# Patient Record
Sex: Female | Born: 1955
Health system: Southern US, Community
[De-identification: ages and names within clinical notes are randomized; demographics above are authoritative.]

## PROBLEM LIST (undated history)

## (undated) ENCOUNTER — Emergency Department (HOSPITAL_COMMUNITY): Admission: EM | Payer: Federal, State, Local not specified - PPO | Source: Home / Self Care

## (undated) ENCOUNTER — Emergency Department (HOSPITAL_COMMUNITY): Admission: EM | Payer: Federal, State, Local not specified - PPO

## (undated) DIAGNOSIS — Z923 Personal history of irradiation: Secondary | ICD-10-CM

## (undated) DIAGNOSIS — I2699 Other pulmonary embolism without acute cor pulmonale: Secondary | ICD-10-CM

## (undated) DIAGNOSIS — D509 Iron deficiency anemia, unspecified: Secondary | ICD-10-CM

## (undated) DIAGNOSIS — M1990 Unspecified osteoarthritis, unspecified site: Secondary | ICD-10-CM

## (undated) DIAGNOSIS — E0590 Thyrotoxicosis, unspecified without thyrotoxic crisis or storm: Secondary | ICD-10-CM

## (undated) DIAGNOSIS — D259 Leiomyoma of uterus, unspecified: Secondary | ICD-10-CM

## (undated) DIAGNOSIS — K219 Gastro-esophageal reflux disease without esophagitis: Secondary | ICD-10-CM

## (undated) DIAGNOSIS — R011 Cardiac murmur, unspecified: Secondary | ICD-10-CM

## (undated) DIAGNOSIS — E785 Hyperlipidemia, unspecified: Secondary | ICD-10-CM

## (undated) DIAGNOSIS — C801 Malignant (primary) neoplasm, unspecified: Secondary | ICD-10-CM

## (undated) DIAGNOSIS — F419 Anxiety disorder, unspecified: Secondary | ICD-10-CM

## (undated) DIAGNOSIS — I82409 Acute embolism and thrombosis of unspecified deep veins of unspecified lower extremity: Secondary | ICD-10-CM

## (undated) DIAGNOSIS — Z8719 Personal history of other diseases of the digestive system: Secondary | ICD-10-CM

## (undated) DIAGNOSIS — I10 Essential (primary) hypertension: Secondary | ICD-10-CM

## (undated) DIAGNOSIS — R002 Palpitations: Secondary | ICD-10-CM

## (undated) DIAGNOSIS — E559 Vitamin D deficiency, unspecified: Secondary | ICD-10-CM

## (undated) DIAGNOSIS — C50919 Malignant neoplasm of unspecified site of unspecified female breast: Secondary | ICD-10-CM

## (undated) DIAGNOSIS — R51 Headache: Secondary | ICD-10-CM

## (undated) HISTORY — PX: COLONOSCOPY: SHX174

## (undated) HISTORY — PX: OTHER SURGICAL HISTORY: SHX169

## (undated) HISTORY — DX: Hyperlipidemia, unspecified: E78.5

## (undated) HISTORY — DX: Iron deficiency anemia, unspecified: D50.9

## (undated) HISTORY — DX: Malignant neoplasm of unspecified site of unspecified female breast: C50.919

## (undated) HISTORY — PX: CHOLECYSTECTOMY: SHX55

## (undated) HISTORY — PX: ESOPHAGOGASTRODUODENOSCOPY: SHX1529

## (undated) HISTORY — DX: Acute embolism and thrombosis of unspecified deep veins of unspecified lower extremity: I82.409

## (undated) HISTORY — DX: Vitamin D deficiency, unspecified: E55.9

## (undated) HISTORY — DX: Other pulmonary embolism without acute cor pulmonale: I26.99

## (undated) HISTORY — DX: Essential (primary) hypertension: I10

## (undated) HISTORY — PX: DILATION AND CURETTAGE OF UTERUS: SHX78

## (undated) HISTORY — DX: Leiomyoma of uterus, unspecified: D25.9

## (undated) HISTORY — DX: Personal history of irradiation: Z92.3

## (undated) HISTORY — DX: Headache: R51

---

## 2008-02-12 DIAGNOSIS — I2699 Other pulmonary embolism without acute cor pulmonale: Secondary | ICD-10-CM

## 2008-02-12 HISTORY — DX: Other pulmonary embolism without acute cor pulmonale: I26.99

## 2008-03-30 ENCOUNTER — Ambulatory Visit: Payer: Self-pay | Admitting: Internal Medicine

## 2008-03-30 DIAGNOSIS — J309 Allergic rhinitis, unspecified: Secondary | ICD-10-CM | POA: Insufficient documentation

## 2008-03-30 DIAGNOSIS — E785 Hyperlipidemia, unspecified: Secondary | ICD-10-CM | POA: Insufficient documentation

## 2008-03-30 DIAGNOSIS — I10 Essential (primary) hypertension: Secondary | ICD-10-CM | POA: Insufficient documentation

## 2008-03-30 DIAGNOSIS — K219 Gastro-esophageal reflux disease without esophagitis: Secondary | ICD-10-CM | POA: Insufficient documentation

## 2008-03-30 HISTORY — DX: Allergic rhinitis, unspecified: J30.9

## 2008-04-08 ENCOUNTER — Telehealth: Payer: Self-pay | Admitting: Internal Medicine

## 2008-04-14 ENCOUNTER — Telehealth: Payer: Self-pay | Admitting: Internal Medicine

## 2008-04-14 ENCOUNTER — Encounter: Payer: Self-pay | Admitting: Internal Medicine

## 2008-04-18 ENCOUNTER — Ambulatory Visit: Payer: Self-pay | Admitting: Internal Medicine

## 2008-04-18 ENCOUNTER — Telehealth (INDEPENDENT_AMBULATORY_CARE_PROVIDER_SITE_OTHER): Payer: Self-pay | Admitting: *Deleted

## 2008-04-19 ENCOUNTER — Encounter (INDEPENDENT_AMBULATORY_CARE_PROVIDER_SITE_OTHER): Payer: Self-pay | Admitting: *Deleted

## 2008-04-19 DIAGNOSIS — D509 Iron deficiency anemia, unspecified: Secondary | ICD-10-CM | POA: Insufficient documentation

## 2008-04-19 DIAGNOSIS — E538 Deficiency of other specified B group vitamins: Secondary | ICD-10-CM | POA: Insufficient documentation

## 2008-04-19 LAB — CONVERTED CEMR LAB
AST: 16 units/L (ref 0–37)
Albumin: 3.2 g/dL — ABNORMAL LOW (ref 3.5–5.2)
Alkaline Phosphatase: 63 units/L (ref 39–117)
BUN: 12 mg/dL (ref 6–23)
Basophils Absolute: 0 10*3/uL (ref 0.0–0.1)
Basophils Relative: 0.2 % (ref 0.0–3.0)
Bilirubin, Direct: 0.1 mg/dL (ref 0.0–0.3)
CO2: 26 meq/L (ref 19–32)
Calcium: 8.7 mg/dL (ref 8.4–10.5)
Cholesterol: 165 mg/dL (ref 0–200)
Eosinophils Absolute: 0.2 10*3/uL (ref 0.0–0.7)
GFR calc Af Amer: 85 mL/min
Glucose, Bld: 98 mg/dL (ref 70–99)
Hemoglobin, Urine: NEGATIVE
Hemoglobin: 11.7 g/dL — ABNORMAL LOW (ref 12.0–15.0)
Ketones, ur: NEGATIVE mg/dL
LDL Cholesterol: 114 mg/dL — ABNORMAL HIGH (ref 0–99)
MCHC: 34 g/dL (ref 30.0–36.0)
MCV: 92.6 fL (ref 78.0–100.0)
Monocytes Absolute: 0.4 10*3/uL (ref 0.1–1.0)
Neutro Abs: 3.9 10*3/uL (ref 1.4–7.7)
Neutrophils Relative %: 62.3 % (ref 43.0–77.0)
Potassium: 3.9 meq/L (ref 3.5–5.1)
RBC: 3.71 M/uL — ABNORMAL LOW (ref 3.87–5.11)
Saturation Ratios: 7.9 % — ABNORMAL LOW (ref 20.0–50.0)
Sodium: 139 meq/L (ref 135–145)
Specific Gravity, Urine: >=1.03 (ref 1.000–1.035)
Total Bilirubin: 0.6 mg/dL (ref 0.3–1.2)
Total Protein: 7 g/dL (ref 6.0–8.3)
Transferrin: 359.4 mg/dL (ref 212.0–360.0)
Triglycerides: 91 mg/dL (ref 0–149)
Urine Glucose: NEGATIVE mg/dL
Urobilinogen, UA: 0.2 (ref 0.0–1.0)
VLDL: 18 mg/dL (ref 0–40)
Vitamin B-12: 190 pg/mL — ABNORMAL LOW (ref 211–911)
pH: 5.5 (ref 5.0–8.0)

## 2008-04-22 ENCOUNTER — Ambulatory Visit: Payer: Self-pay | Admitting: Internal Medicine

## 2008-04-22 DIAGNOSIS — M79609 Pain in unspecified limb: Secondary | ICD-10-CM

## 2008-04-22 HISTORY — DX: Pain in unspecified limb: M79.609

## 2008-04-26 ENCOUNTER — Inpatient Hospital Stay (HOSPITAL_COMMUNITY): Admission: AD | Admit: 2008-04-26 | Discharge: 2008-05-01 | Payer: Self-pay | Admitting: Cardiology

## 2008-04-27 ENCOUNTER — Telehealth: Payer: Self-pay | Admitting: Internal Medicine

## 2008-05-05 ENCOUNTER — Telehealth (INDEPENDENT_AMBULATORY_CARE_PROVIDER_SITE_OTHER): Payer: Self-pay | Admitting: *Deleted

## 2008-05-06 ENCOUNTER — Encounter: Admission: RE | Admit: 2008-05-06 | Discharge: 2008-05-06 | Payer: Self-pay | Admitting: Cardiovascular Disease

## 2008-05-06 ENCOUNTER — Encounter: Payer: Self-pay | Admitting: Internal Medicine

## 2008-05-06 ENCOUNTER — Encounter (INDEPENDENT_AMBULATORY_CARE_PROVIDER_SITE_OTHER): Payer: Self-pay | Admitting: Cardiology

## 2008-05-06 ENCOUNTER — Inpatient Hospital Stay (HOSPITAL_COMMUNITY): Admission: AD | Admit: 2008-05-06 | Discharge: 2008-05-13 | Payer: Self-pay | Admitting: Cardiology

## 2008-05-06 ENCOUNTER — Ambulatory Visit: Payer: Self-pay | Admitting: Vascular Surgery

## 2008-05-08 ENCOUNTER — Ambulatory Visit: Payer: Self-pay | Admitting: Oncology

## 2008-05-13 ENCOUNTER — Ambulatory Visit: Payer: Self-pay | Admitting: Oncology

## 2008-05-17 ENCOUNTER — Encounter: Payer: Self-pay | Admitting: Internal Medicine

## 2008-06-01 LAB — CBC WITH DIFFERENTIAL/PLATELET
Basophils Absolute: 0 10*3/uL (ref 0.0–0.1)
Eosinophils Absolute: 0.1 10*3/uL (ref 0.0–0.5)
HGB: 11.6 g/dL (ref 11.6–15.9)
MCV: 91.8 fL (ref 79.5–101.0)
MONO%: 7.4 % (ref 0.0–14.0)
NEUT#: 2.3 10*3/uL (ref 1.5–6.5)
RDW: 14.4 % (ref 11.2–14.5)

## 2008-06-02 LAB — COMPREHENSIVE METABOLIC PANEL
ALT: 16 U/L (ref 0–35)
CO2: 25 mEq/L (ref 19–32)
Calcium: 9.1 mg/dL (ref 8.4–10.5)
Chloride: 105 mEq/L (ref 96–112)
Creatinine, Ser: 0.85 mg/dL (ref 0.40–1.20)
Total Protein: 7.4 g/dL (ref 6.0–8.3)

## 2008-06-02 LAB — FERRITIN: Ferritin: 16 ng/mL (ref 10–291)

## 2008-06-02 LAB — IRON AND TIBC: %SAT: 13 % — ABNORMAL LOW (ref 20–55)

## 2008-06-28 ENCOUNTER — Ambulatory Visit: Payer: Self-pay | Admitting: Oncology

## 2008-06-28 LAB — CBC WITH DIFFERENTIAL/PLATELET
BASO%: 0.2 % (ref 0.0–2.0)
Basophils Absolute: 0 10*3/uL (ref 0.0–0.1)
EOS%: 0.9 % (ref 0.0–7.0)
MCH: 31.1 pg (ref 25.1–34.0)
MCHC: 33.5 g/dL (ref 31.5–36.0)
MCV: 92.8 fL (ref 79.5–101.0)
MONO%: 6.3 % (ref 0.0–14.0)
RBC: 3.76 10*6/uL (ref 3.70–5.45)
RDW: 14.2 % (ref 11.2–14.5)
nRBC: 0 % (ref 0–0)

## 2008-06-28 LAB — FERRITIN: Ferritin: 15 ng/mL (ref 10–291)

## 2008-06-28 LAB — IRON AND TIBC: Iron: 85 ug/dL (ref 42–145)

## 2008-08-02 LAB — IRON AND TIBC
Iron: 73 ug/dL (ref 42–145)
TIBC: 325 ug/dL (ref 250–470)
UIBC: 252 ug/dL

## 2008-08-02 LAB — FERRITIN: Ferritin: 13 ng/mL (ref 10–291)

## 2008-08-02 LAB — CBC WITH DIFFERENTIAL/PLATELET
Basophils Absolute: 0 10*3/uL (ref 0.0–0.1)
EOS%: 2.3 % (ref 0.0–7.0)
Eosinophils Absolute: 0.1 10*3/uL (ref 0.0–0.5)
HGB: 12.3 g/dL (ref 11.6–15.9)
NEUT#: 2.5 10*3/uL (ref 1.5–6.5)
RDW: 14.5 % (ref 11.2–14.5)
lymph#: 2 10*3/uL (ref 0.9–3.3)

## 2008-08-17 ENCOUNTER — Ambulatory Visit: Payer: Self-pay | Admitting: Internal Medicine

## 2008-10-03 ENCOUNTER — Ambulatory Visit: Payer: Self-pay | Admitting: Oncology

## 2008-10-05 LAB — CBC WITH DIFFERENTIAL/PLATELET
Basophils Absolute: 0 10*3/uL (ref 0.0–0.1)
Eosinophils Absolute: 0.1 10*3/uL (ref 0.0–0.5)
HGB: 12.5 g/dL (ref 11.6–15.9)
LYMPH%: 30.2 % (ref 14.0–49.7)
MCV: 98 fL (ref 79.5–101.0)
MONO%: 8.4 % (ref 0.0–14.0)
NEUT#: 2.6 10*3/uL (ref 1.5–6.5)
Platelets: 153 10*3/uL (ref 145–400)
RBC: 3.79 10*6/uL (ref 3.70–5.45)

## 2008-10-06 LAB — FERRITIN: Ferritin: 26 ng/mL (ref 10–291)

## 2008-10-06 LAB — FACTOR 8 ASSAY: Coagulation Factor VIII: 223 % — ABNORMAL HIGH (ref 73–140)

## 2008-12-13 ENCOUNTER — Ambulatory Visit: Payer: Self-pay | Admitting: Oncology

## 2008-12-15 LAB — COMPREHENSIVE METABOLIC PANEL
ALT: 22 U/L (ref 0–35)
CO2: 28 mEq/L (ref 19–32)
Calcium: 9 mg/dL (ref 8.4–10.5)
Chloride: 106 mEq/L (ref 96–112)
Creatinine, Ser: 0.88 mg/dL (ref 0.40–1.20)
Glucose, Bld: 82 mg/dL (ref 70–99)
Sodium: 139 mEq/L (ref 135–145)
Total Bilirubin: 0.3 mg/dL (ref 0.3–1.2)
Total Protein: 7.8 g/dL (ref 6.0–8.3)

## 2008-12-15 LAB — CBC WITH DIFFERENTIAL/PLATELET
Eosinophils Absolute: 0.2 10*3/uL (ref 0.0–0.5)
HCT: 38.3 % (ref 34.8–46.6)
LYMPH%: 36 % (ref 14.0–49.7)
MONO#: 0.6 10*3/uL (ref 0.1–0.9)
NEUT#: 3.2 10*3/uL (ref 1.5–6.5)
NEUT%: 50.9 % (ref 38.4–76.8)
Platelets: 161 10*3/uL (ref 145–400)
WBC: 6.2 10*3/uL (ref 3.9–10.3)
lymph#: 2.2 10*3/uL (ref 0.9–3.3)

## 2008-12-16 LAB — IRON AND TIBC
%SAT: 33 % (ref 20–55)
Iron: 106 ug/dL (ref 42–145)

## 2008-12-16 LAB — FERRITIN: Ferritin: 28 ng/mL (ref 10–291)

## 2009-01-03 ENCOUNTER — Encounter: Payer: Self-pay | Admitting: Internal Medicine

## 2009-01-10 ENCOUNTER — Encounter: Admission: RE | Admit: 2009-01-10 | Discharge: 2009-01-10 | Payer: Self-pay | Admitting: Cardiovascular Disease

## 2009-03-15 ENCOUNTER — Ambulatory Visit: Payer: Self-pay | Admitting: Oncology

## 2009-03-17 LAB — CBC WITH DIFFERENTIAL/PLATELET
Eosinophils Absolute: 0.1 10*3/uL (ref 0.0–0.5)
HCT: 37.7 % (ref 34.8–46.6)
LYMPH%: 34.9 % (ref 14.0–49.7)
MCHC: 34.5 g/dL (ref 31.5–36.0)
MCV: 99.8 fL (ref 79.5–101.0)
MONO#: 0.3 10*3/uL (ref 0.1–0.9)
MONO%: 6.1 % (ref 0.0–14.0)
NEUT#: 2.5 10*3/uL (ref 1.5–6.5)
NEUT%: 56.5 % (ref 38.4–76.8)
Platelets: 185 10*3/uL (ref 145–400)
RBC: 3.78 10*6/uL (ref 3.70–5.45)
WBC: 4.5 10*3/uL (ref 3.9–10.3)

## 2009-03-17 LAB — COMPREHENSIVE METABOLIC PANEL
Alkaline Phosphatase: 56 U/L (ref 39–117)
CO2: 23 mEq/L (ref 19–32)
Creatinine, Ser: 0.83 mg/dL (ref 0.40–1.20)
Glucose, Bld: 90 mg/dL (ref 70–99)
Sodium: 139 mEq/L (ref 135–145)
Total Bilirubin: 0.3 mg/dL (ref 0.3–1.2)

## 2009-03-20 LAB — FACTOR 8 ASSAY: Coagulation Factor VIII: 305 % — ABNORMAL HIGH (ref 73–140)

## 2009-03-28 ENCOUNTER — Encounter: Payer: Self-pay | Admitting: Internal Medicine

## 2009-06-13 ENCOUNTER — Ambulatory Visit: Payer: Self-pay | Admitting: Oncology

## 2009-06-14 LAB — CBC WITH DIFFERENTIAL/PLATELET
BASO%: 0.6 % (ref 0.0–2.0)
EOS%: 1.8 % (ref 0.0–7.0)
HGB: 12.6 g/dL (ref 11.6–15.9)
MCH: 33.9 pg (ref 25.1–34.0)
MCHC: 33.7 g/dL (ref 31.5–36.0)
MCV: 100.5 fL (ref 79.5–101.0)
MONO%: 6.6 % (ref 0.0–14.0)
RBC: 3.7 10*6/uL (ref 3.70–5.45)
RDW: 13.1 % (ref 11.2–14.5)
lymph#: 2.1 10*3/uL (ref 0.9–3.3)

## 2009-06-14 LAB — COMPREHENSIVE METABOLIC PANEL
ALT: 15 U/L (ref 0–35)
AST: 18 U/L (ref 0–37)
Albumin: 3.7 g/dL (ref 3.5–5.2)
Alkaline Phosphatase: 60 U/L (ref 39–117)
Calcium: 8.8 mg/dL (ref 8.4–10.5)
Chloride: 107 mEq/L (ref 96–112)
Potassium: 3.5 mEq/L (ref 3.5–5.3)
Sodium: 138 mEq/L (ref 135–145)

## 2009-07-19 ENCOUNTER — Emergency Department (HOSPITAL_COMMUNITY): Admission: EM | Admit: 2009-07-19 | Discharge: 2009-07-19 | Payer: Self-pay | Admitting: Emergency Medicine

## 2009-08-22 ENCOUNTER — Telehealth: Payer: Self-pay | Admitting: Internal Medicine

## 2009-09-25 ENCOUNTER — Ambulatory Visit: Payer: Self-pay | Admitting: Oncology

## 2009-09-27 LAB — CBC WITH DIFFERENTIAL/PLATELET
Basophils Absolute: 0 10*3/uL (ref 0.0–0.1)
EOS%: 1.9 % (ref 0.0–7.0)
HGB: 12.3 g/dL (ref 11.6–15.9)
LYMPH%: 38.1 % (ref 14.0–49.7)
MCH: 33.6 pg (ref 25.1–34.0)
MCV: 99 fL (ref 79.5–101.0)
MONO%: 9.4 % (ref 0.0–14.0)
Platelets: 187 10*3/uL (ref 145–400)
RBC: 3.65 10*6/uL — ABNORMAL LOW (ref 3.70–5.45)
RDW: 12.8 % (ref 11.2–14.5)

## 2009-09-28 LAB — FACTOR 8 ASSAY: Coagulation Factor VIII: 154 % — ABNORMAL HIGH (ref 73–140)

## 2009-09-28 LAB — COMPREHENSIVE METABOLIC PANEL
AST: 12 U/L (ref 0–37)
Albumin: 3.9 g/dL (ref 3.5–5.2)
Alkaline Phosphatase: 56 U/L (ref 39–117)
BUN: 9 mg/dL (ref 6–23)
Potassium: 4 mEq/L (ref 3.5–5.3)
Total Bilirubin: 0.2 mg/dL — ABNORMAL LOW (ref 0.3–1.2)

## 2009-09-28 LAB — IRON AND TIBC
%SAT: 15 % — ABNORMAL LOW (ref 20–55)
TIBC: 307 ug/dL (ref 250–470)
UIBC: 260 ug/dL

## 2009-09-28 LAB — D-DIMER, QUANTITATIVE: D-Dimer, Quant: 0.38 ug/mL-FEU (ref 0.00–0.48)

## 2009-09-28 LAB — FERRITIN: Ferritin: 26 ng/mL (ref 10–291)

## 2009-09-29 ENCOUNTER — Encounter: Admission: RE | Admit: 2009-09-29 | Discharge: 2009-09-29 | Payer: Self-pay | Admitting: Cardiology

## 2009-10-12 ENCOUNTER — Encounter: Payer: Self-pay | Admitting: Internal Medicine

## 2009-12-19 ENCOUNTER — Ambulatory Visit: Payer: Self-pay | Admitting: Oncology

## 2009-12-21 LAB — CBC WITH DIFFERENTIAL/PLATELET
BASO%: 0.5 % (ref 0.0–2.0)
Basophils Absolute: 0 10*3/uL (ref 0.0–0.1)
Eosinophils Absolute: 0.1 10*3/uL (ref 0.0–0.5)
HCT: 37.6 % (ref 34.8–46.6)
HGB: 12.6 g/dL (ref 11.6–15.9)
LYMPH%: 36.8 % (ref 14.0–49.7)
MONO#: 0.5 10*3/uL (ref 0.1–0.9)
NEUT#: 3 10*3/uL (ref 1.5–6.5)
NEUT%: 52.7 % (ref 38.4–76.8)
Platelets: 201 10*3/uL (ref 145–400)
WBC: 5.8 10*3/uL (ref 3.9–10.3)
lymph#: 2.1 10*3/uL (ref 0.9–3.3)

## 2009-12-21 LAB — COMPREHENSIVE METABOLIC PANEL
ALT: 18 U/L (ref 0–35)
BUN: 10 mg/dL (ref 6–23)
CO2: 28 mEq/L (ref 19–32)
Calcium: 9.7 mg/dL (ref 8.4–10.5)
Chloride: 103 mEq/L (ref 96–112)
Creatinine, Ser: 0.87 mg/dL (ref 0.40–1.20)
Glucose, Bld: 86 mg/dL (ref 70–99)

## 2009-12-22 LAB — FACTOR 8 ASSAY: Coagulation Factor VIII: 338 % — ABNORMAL HIGH (ref 73–140)

## 2009-12-27 ENCOUNTER — Encounter: Admission: RE | Admit: 2009-12-27 | Discharge: 2009-12-27 | Payer: Self-pay | Admitting: Obstetrics and Gynecology

## 2010-01-08 ENCOUNTER — Telehealth (INDEPENDENT_AMBULATORY_CARE_PROVIDER_SITE_OTHER): Payer: Self-pay | Admitting: *Deleted

## 2010-03-13 NOTE — Progress Notes (Signed)
  Phone Note Refill Request Message from:  Fax from Pharmacy on August 22, 2009 8:49 AM  Refills Requested: Medication #1:  PREVACID 30 MG CPDR 1 by mouth once daily   Dosage confirmed as above?Dosage Confirmed   Last Refilled: 04/2008   Notes: [facility] Initial call taken by: Zella Ball Ewing CMA (AAMA),  August 22, 2009 8:49 AM    Prescriptions: PREVACID 30 MG CPDR (LANSOPRAZOLE) 1 by mouth once daily  #30 x 0   Entered by:   Scharlene Gloss CMA (AAMA)   Authorized by:   Corwin Levins MD   Signed by:   Scharlene Gloss CMA (AAMA) on 08/22/2009   Method used:   Faxed to ...       [facility] (retail)       [address]       [place], [place]  [address]       Ph: [phone]       Fax: [phone]   RxID:   [id number]

## 2010-03-13 NOTE — Progress Notes (Signed)
  Phone Note Other Incoming   Request: Send information Summary of Call: Request received from [facility] forwarded to [facility].

## 2010-03-13 NOTE — Letter (Signed)
Summary: [facility]  [facility]   Imported By: Sherian Rein 04/03/2009 07:25:18  _____________________________________________________________________  External Attachment:    Type:   Image     Comment:   External Document

## 2010-03-13 NOTE — Letter (Signed)
Summary: [facility]  [facility]   Imported By: Lester White 10/26/2009 10:07:35  _____________________________________________________________________  External Attachment:    Type:   Image     Comment:   External Document

## 2010-04-06 ENCOUNTER — Encounter (HOSPITAL_BASED_OUTPATIENT_CLINIC_OR_DEPARTMENT_OTHER): Payer: Federal, State, Local not specified - PPO | Admitting: Oncology

## 2010-04-06 ENCOUNTER — Other Ambulatory Visit: Payer: Self-pay | Admitting: Oncology

## 2010-04-06 DIAGNOSIS — I82409 Acute embolism and thrombosis of unspecified deep veins of unspecified lower extremity: Secondary | ICD-10-CM

## 2010-04-06 DIAGNOSIS — Z7901 Long term (current) use of anticoagulants: Secondary | ICD-10-CM

## 2010-04-06 DIAGNOSIS — D509 Iron deficiency anemia, unspecified: Secondary | ICD-10-CM

## 2010-04-06 DIAGNOSIS — Z86718 Personal history of other venous thrombosis and embolism: Secondary | ICD-10-CM

## 2010-04-06 LAB — CBC WITH DIFFERENTIAL/PLATELET
BASO%: 0.6 % (ref 0.0–2.0)
Basophils Absolute: 0 10*3/uL (ref 0.0–0.1)
EOS%: 2.6 % (ref 0.0–7.0)
HGB: 12.3 g/dL (ref 11.6–15.9)
MCH: 32.5 pg (ref 25.1–34.0)
MCHC: 33.8 g/dL (ref 31.5–36.0)
MONO#: 0.4 10*3/uL (ref 0.1–0.9)
RDW: 12.8 % (ref 11.2–14.5)
WBC: 5.4 10*3/uL (ref 3.9–10.3)
lymph#: 2 10*3/uL (ref 0.9–3.3)

## 2010-04-06 LAB — COMPREHENSIVE METABOLIC PANEL
ALT: 13 U/L (ref 0–35)
AST: 14 U/L (ref 0–37)
Albumin: 3.8 g/dL (ref 3.5–5.2)
Calcium: 9.3 mg/dL (ref 8.4–10.5)
Chloride: 105 mEq/L (ref 96–112)
Potassium: 4.1 mEq/L (ref 3.5–5.3)

## 2010-04-30 LAB — BASIC METABOLIC PANEL
CO2: 23 mEq/L (ref 19–32)
Chloride: 109 mEq/L (ref 96–112)
GFR calc Af Amer: >60 mL/min (ref >60)
Potassium: 4.2 mEq/L (ref 3.5–5.1)
Sodium: 136 mEq/L (ref 135–145)

## 2010-05-23 LAB — CBC
HCT: 31.3 % — ABNORMAL LOW (ref 36.0–46.0)
MCV: 92.4 fL (ref 78.0–100.0)
RBC: 3.38 MIL/uL — ABNORMAL LOW (ref 3.87–5.11)
WBC: 8 10*3/uL (ref 4.0–10.5)

## 2010-05-24 LAB — CBC
HCT: 31.9 % — ABNORMAL LOW (ref 36.0–46.0)
HCT: 32.5 % — ABNORMAL LOW (ref 36.0–46.0)
HCT: 32.5 % — ABNORMAL LOW (ref 36.0–46.0)
HCT: 33.5 % — ABNORMAL LOW (ref 36.0–46.0)
HCT: 34.2 % — ABNORMAL LOW (ref 36.0–46.0)
HCT: 35.8 % — ABNORMAL LOW (ref 36.0–46.0)
Hemoglobin: 10.8 g/dL — ABNORMAL LOW (ref 12.0–15.0)
Hemoglobin: 10.9 g/dL — ABNORMAL LOW (ref 12.0–15.0)
Hemoglobin: 10.9 g/dL — ABNORMAL LOW (ref 12.0–15.0)
Hemoglobin: 11.6 g/dL — ABNORMAL LOW (ref 12.0–15.0)
Hemoglobin: 12.3 g/dL (ref 12.0–15.0)
MCHC: 33.8 g/dL (ref 30.0–36.0)
MCHC: 33.9 g/dL (ref 30.0–36.0)
MCHC: 33.9 g/dL (ref 30.0–36.0)
MCHC: 34.2 g/dL (ref 30.0–36.0)
MCV: 91.6 fL (ref 78.0–100.0)
MCV: 92.9 fL (ref 78.0–100.0)
MCV: 93 fL (ref 78.0–100.0)
MCV: 93.1 fL (ref 78.0–100.0)
MCV: 93.7 fL (ref 78.0–100.0)
MCV: 93.7 fL (ref 78.0–100.0)
MCV: 94.1 fL (ref 78.0–100.0)
Platelets: 129 10*3/uL — ABNORMAL LOW (ref 150–400)
Platelets: 160 10*3/uL (ref 150–400)
Platelets: 210 10*3/uL (ref 150–400)
Platelets: 213 10*3/uL (ref 150–400)
Platelets: 223 10*3/uL (ref 150–400)
Platelets: 224 10*3/uL (ref 150–400)
RBC: 3.41 MIL/uL — ABNORMAL LOW (ref 3.87–5.11)
RBC: 3.47 MIL/uL — ABNORMAL LOW (ref 3.87–5.11)
RBC: 3.48 MIL/uL — ABNORMAL LOW (ref 3.87–5.11)
RBC: 3.56 MIL/uL — ABNORMAL LOW (ref 3.87–5.11)
RBC: 3.61 MIL/uL — ABNORMAL LOW (ref 3.87–5.11)
RBC: 3.68 MIL/uL — ABNORMAL LOW (ref 3.87–5.11)
RDW: 13.9 % (ref 11.5–15.5)
RDW: 14.1 % (ref 11.5–15.5)
RDW: 14.6 % (ref 11.5–15.5)
RDW: 14.6 % (ref 11.5–15.5)
WBC: 6.5 10*3/uL (ref 4.0–10.5)
WBC: 7.6 10*3/uL (ref 4.0–10.5)
WBC: 7.9 10*3/uL (ref 4.0–10.5)
WBC: 8.1 10*3/uL (ref 4.0–10.5)
WBC: 8.2 10*3/uL (ref 4.0–10.5)
WBC: 8.4 10*3/uL (ref 4.0–10.5)
WBC: 8.4 10*3/uL (ref 4.0–10.5)
WBC: 8.6 10*3/uL (ref 4.0–10.5)
WBC: 8.8 10*3/uL (ref 4.0–10.5)

## 2010-05-24 LAB — LUPUS ANTICOAGULANT PANEL: Lupus Anticoagulant: NOT DETECTED

## 2010-05-24 LAB — COMPREHENSIVE METABOLIC PANEL
ALT: 14 U/L (ref 0–35)
ALT: 16 U/L (ref 0–35)
AST: 17 U/L (ref 0–37)
Albumin: 3 g/dL — ABNORMAL LOW (ref 3.5–5.2)
Albumin: 3 g/dL — ABNORMAL LOW (ref 3.5–5.2)
Alkaline Phosphatase: 51 U/L (ref 39–117)
Alkaline Phosphatase: 58 U/L (ref 39–117)
BUN: 7 mg/dL (ref 6–23)
CO2: 25 mEq/L (ref 19–32)
Chloride: 107 mEq/L (ref 96–112)
Chloride: 109 mEq/L (ref 96–112)
Creatinine, Ser: 0.9 mg/dL (ref 0.4–1.2)
GFR calc Af Amer: >60 mL/min (ref >60)
GFR calc non Af Amer: >60 mL/min (ref >60)
Glucose, Bld: 88 mg/dL (ref 70–99)
Glucose, Bld: 90 mg/dL (ref 70–99)
Potassium: 3.9 mEq/L (ref 3.5–5.1)
Potassium: 4 mEq/L (ref 3.5–5.1)
Sodium: 137 mEq/L (ref 135–145)
Sodium: 141 mEq/L (ref 135–145)
Total Bilirubin: 0.3 mg/dL (ref 0.3–1.2)
Total Bilirubin: 0.4 mg/dL (ref 0.3–1.2)
Total Protein: 6.6 g/dL (ref 6.0–8.3)
Total Protein: 6.6 g/dL (ref 6.0–8.3)
Total Protein: 6.9 g/dL (ref 6.0–8.3)

## 2010-05-24 LAB — APTT: aPTT: 32 seconds (ref 24–37)

## 2010-05-24 LAB — BASIC METABOLIC PANEL
BUN: 10 mg/dL (ref 6–23)
BUN: 12 mg/dL (ref 6–23)
BUN: 8 mg/dL (ref 6–23)
CO2: 22 mEq/L (ref 19–32)
CO2: 23 mEq/L (ref 19–32)
CO2: 23 mEq/L (ref 19–32)
Calcium: 9.1 mg/dL (ref 8.4–10.5)
Chloride: 104 mEq/L (ref 96–112)
Chloride: 106 mEq/L (ref 96–112)
Creatinine, Ser: 0.82 mg/dL (ref 0.4–1.2)
Creatinine, Ser: 0.84 mg/dL (ref 0.4–1.2)
Creatinine, Ser: 0.93 mg/dL (ref 0.4–1.2)
GFR calc Af Amer: >60 mL/min (ref >60)
GFR calc Af Amer: >60 mL/min (ref >60)
GFR calc Af Amer: >60 mL/min (ref >60)
GFR calc non Af Amer: >60 mL/min (ref >60)
GFR calc non Af Amer: >60 mL/min (ref >60)
Glucose, Bld: 102 mg/dL — ABNORMAL HIGH (ref 70–99)
Glucose, Bld: 95 mg/dL (ref 70–99)
Potassium: 3.7 mEq/L (ref 3.5–5.1)
Potassium: 3.9 mEq/L (ref 3.5–5.1)
Potassium: 4 mEq/L (ref 3.5–5.1)
Sodium: 137 mEq/L (ref 135–145)
Sodium: 137 mEq/L (ref 135–145)

## 2010-05-24 LAB — FIBRINOGEN: Fibrinogen: 449 mg/dL (ref 204–475)

## 2010-05-24 LAB — HEPARIN ANTIBODY SCREEN: Heparin Antibody Screen: NEGATIVE

## 2010-05-24 LAB — PROTIME-INR
INR: 1.2 (ref 0.00–1.49)
INR: 1.3 (ref 0.00–1.49)
INR: 2 — ABNORMAL HIGH (ref 0.00–1.49)
INR: 2.2 — ABNORMAL HIGH (ref 0.00–1.49)
Prothrombin Time: 16.6 seconds — ABNORMAL HIGH (ref 11.6–15.2)
Prothrombin Time: 17.2 seconds — ABNORMAL HIGH (ref 11.6–15.2)
Prothrombin Time: 22.4 seconds — ABNORMAL HIGH (ref 11.6–15.2)

## 2010-05-24 LAB — HEPARIN LEVEL (UNFRACTIONATED)
Heparin Unfractionated: 0.24 IU/mL — ABNORMAL LOW (ref 0.30–0.70)
Heparin Unfractionated: 0.41 IU/mL (ref 0.30–0.70)
Heparin Unfractionated: 0.64 IU/mL (ref 0.30–0.70)
Heparin Unfractionated: 0.67 IU/mL (ref 0.30–0.70)
Heparin Unfractionated: 0.68 IU/mL (ref 0.30–0.70)
Heparin Unfractionated: 0.85 IU/mL — ABNORMAL HIGH (ref 0.30–0.70)

## 2010-05-24 LAB — FACTOR 2 ASSAY: Factor II Activity: 124 % (ref 74–131)

## 2010-05-24 LAB — BETA-2-GLYCOPROTEIN I ABS, IGG/M/A
Beta-2 Glyco I IgG: 6 U/mL (ref <20)
Beta-2-Glycoprotein I IgA: <4 U/mL (ref <10)

## 2010-05-24 LAB — IGG, IGA, IGM
IgA: <7 mg/dL — ABNORMAL LOW (ref 68–378)
IgG (Immunoglobin G), Serum: 2330 mg/dL — ABNORMAL HIGH (ref 694–1618)

## 2010-05-24 LAB — FACTOR 8 ASSAY: Coagulation Factor VIII: 339 % — ABNORMAL HIGH (ref 73–140)

## 2010-05-24 LAB — GLUCOSE, CAPILLARY: Glucose-Capillary: 101 mg/dL — ABNORMAL HIGH (ref 70–99)

## 2010-05-24 LAB — PROTEIN S ACTIVITY: Protein S Activity: 126 % (ref 69–129)

## 2010-05-24 LAB — PROTEIN C ACTIVITY: Protein C Activity: 131 % (ref 75–133)

## 2010-05-24 LAB — HISTOPLASMA ANTIGEN (BLD, CSF, BRONCH WASH, OTHER): Histoplasma Antigen (HISTAG): NEGATIVE

## 2010-06-12 ENCOUNTER — Other Ambulatory Visit: Payer: Self-pay | Admitting: Obstetrics and Gynecology

## 2010-06-12 DIAGNOSIS — Z1231 Encounter for screening mammogram for malignant neoplasm of breast: Secondary | ICD-10-CM

## 2010-06-20 ENCOUNTER — Ambulatory Visit (HOSPITAL_COMMUNITY)
Admission: RE | Admit: 2010-06-20 | Discharge: 2010-06-20 | Disposition: A | Discharge status: Home / Self Care | Payer: Federal, State, Local not specified - PPO | Source: Ambulatory Visit | Attending: Obstetrics and Gynecology | Admitting: Obstetrics and Gynecology

## 2010-06-20 DIAGNOSIS — Z1231 Encounter for screening mammogram for malignant neoplasm of breast: Secondary | ICD-10-CM | POA: Insufficient documentation

## 2010-06-26 NOTE — Discharge Summary (Signed)
NAMEMADEEHA, COSTANTINO               ACCOUNT NO.:  [id number]   MEDICAL RECORD NO.:  [id number]          PATIENT TYPE:  INP   LOCATION:  2008                         FACILITY:  [facility]   PHYSICIAN:  Sheliah Mends, MD      DATE OF BIRTH:  [birth year]   DATE OF ADMISSION:  04/26/2008  DATE OF DISCHARGE:  05/01/2008                               DISCHARGE SUMMARY   DISCHARGE DIAGNOSES:  1. Right lower extremity deep vein thrombosis.  2. Uterine fibroids, on estrogen containing medication prior to      admission.  3. Treated hypertension.  4. Treated dyslipidemia.   HOSPITAL COURSE:  Ms. Brickman is a pleasant 55 year old female who  presented to Dr. Carren Rang at Urgent Care with right lower extremity  pain and swelling.  Venous duplex was obtained and was positive for a  DVT in the right superficial femoral, popliteal, posterior tibial, and  peroneal veins.  She is on an oral contraception with estrogen.  She has  a history of fibroids.  Dr. Garen Lah spoke with Dr. Henderson Cloud from  [facility].  Dr. Henderson Cloud recommended discontinuing  Aviane and replacing it with Micronor, a progesterone-containing  preparation.  The patient will ultimately need a hysterectomy.  The  patient was admitted to [facility] for heparin-to-Coumadin crossover.  She was having some menstrual bleeding.  She developed some heavy  bleeding of one point and we ended up having to stop her heparin.  Her  INR was therapeutic at that time.  She was watched for 24 hours and  improved.  We feel that she can be discharged on May 01, 2008.  Her  INR is therapeutic at 2.6.  Hemoglobin is stable at 10.8.  Her bleeding  has lessened.   LABORATORY DATA:  White count 6.5, hemoglobin 10.8, hematocrit 31.9,  platelets 147.  INR is 2.6.  Sodium 131, potassium 3.7, BUN 10,  creatinine 0.9.  Cardiolipin antibody and factor 5 Leiden are pending.  EKG shows sinus rhythm without acute changes.   DISCHARGE  MEDICATIONS:  1. Iron 65 mg daily.  2. Prevacid 30 mg a day p.r.n.  3. Benicar 20 mg a day.  4. Pravastatin 20 mg a day.  5. Micronor 1 tablet daily.  6. Coumadin 5 mg Sunday, Tuesdays, and Thursdays and 7.5 mg other      days.   DISPOSITION:  The patient is discharged in stable condition.  She will  have a followup INR on Tuesday.      Abelino Derrick, P.A.      Sheliah Mends, MD  Electronically Signed    LKK/MEDQ  D:  05/01/2008  T:  05/01/2008  Job:  [id number]   cc:   Carrington Clamp, M.D.  Carren Rang, M.D.

## 2010-06-26 NOTE — Consult Note (Signed)
[name], [name]               ACCOUNT NO.:  [id number]   MEDICAL RECORD NO.:  [id number]          PATIENT TYPE:  INP   LOCATION:  3732                         FACILITY:  [facility]   PHYSICIAN:  Blenda Nicely. Shadad        DATE OF BIRTH:  13-Jul-1955   DATE OF CONSULTATION:  05/06/2008  DATE OF DISCHARGE:                                 CONSULTATION   REQUESTING PHYSICIAN:  Sheliah Mends, MD   REASON FOR CONSULTATION:  DVT and pulmonary embolus.   HISTORY OF PRESENT ILLNESS:  This is a 55 year old female, a native of  [place] and recently relocated to [place] due to her job with  the [employer] and be close to her family.  She does have a history of  hypertension and hyperlipidemia and she has also been diagnosed with  uterine fibroids.  She initially was followed by OB/GYN at [facility] and  she was prescribed oral contraceptives back in June 2009 in the form of  Aviane tablet p.o. daily, and again this was June 2009.  The patient  apparently had a D&C in December 2009 and was told that she had uterine  fibroids and was given the option of possible hysterectomy; however,  elected not to do that at that time.  The patient also was noted to have  occasional dyspnea and shortness of breath on exertion but nothing that  warrants to bring in attention to her physicians.  The patient presented  on April 26, 2008 to urgent care with right leg heaviness and swelling  and referred to have a venous ultrasound of her lower extremity, which  showed a thrombosis in her superficial femoral vein on the right side as  well as involvement of the deep venous system of the popliteal,  posterior tibial, and the perineal veins.  Dopplers on the left did not  show any evidence of any thrombosis or phlebitis.  She did not recall  any recent travel or hospitalization.  Again, she did have a D&C back in  December 2009.  The patient subsequently was hospitalized during April 26, 2008 and May 01, 2008.  She was  treated with intravenous heparin  and was transitioned into Coumadin.  Her hospitalization was complicated  by increase in her menstrual bleeding due to presumably her uterine  fibroids and subsequently she was switched from an estrogen-containing  oral contraceptive to a progesterone-containing oral contraceptives in  the form of Micronor, which includes again progesterone only.  Again,  she was discharged on therapeutic doses of Coumadin.  Her INR was 2.6  upon discharge.  The patient presented on May 06, 2008 with symptoms  of palpitation, dizziness, shortness of breath, and was urgently  referred to have a CT scan of the chest, which showed a saddle pulmonary  embolus involving the main pulmonary artery and lobar and segmental  pulmonary arteries in the left upper lobe and the right lower lobe.  There was no other anatomical abnormality in the lung, pleura, or lymph  nodes.  The patient was urgently hospitalized to [facility] and  was started  on intravenous heparin and her INR was checked on May 06, 2008.  The day she had presented with her acute clots, her INR on May 06, 2008 was 2.0 with PT of 24.0.  Her PTT at that time was 32.  Of  note, her hypercoagulable workup on the recent hospitalization between  May 06, 2008 and May 01, 2008 was unremarkable, include the  following findings that included at that time in 3 level which was  normal.  Homocystine level was normal.  ANA was negative.  Factor 2  level activity was within normal range.  Lupus anticoagulant panel was  not detected. Factor V Leiden mutation was negative.  Protein S and  protein C level and activity are all normal respectively.  Anticardiolipin antibody, IgG and IgM both was in 7, both within normal  range.  I was asked to comment on Ms. Koone's care, in a recent  setting of a possible Coumadin failure and acute pulmonary embolus.  Clinically, she is currently asymptomatic.  She reported doing a  lot  better.  She has no more palpitations.  Did not report any chest pain.  Did not report any shortness of breath.  Does not report any other  complaints.  She again as mentioned has not reported any previous  histories of deep vein thrombosis, pulmonary embolus, or vascular  headaches.  She has not reported any miscarriages or complications of  any previous pregnancies.   REVIEW OF SYSTEMS:  Did not report any headaches, vision, or double  vision.  Does not report any motor or sensory neuropathy, alteration in  mental status, psychiatric issues, or depression.  Did not report any  fevers, chills, night sweats, or weight loss.  Not have any appetite  changes.  Did not report any chest pain or orthopnea.  Does not report  any cough or hemoptysis, or hematemesis.  No nausea or vomiting.  No  abdominal pain.  No GI symptoms.  No constipation, diarrhea.  No  frequency, urgency, or hesitancy.  Rest of review of systems  unremarkable.   PAST MEDICAL HISTORY:  1. Significant for hypertension.  2. Hyperlipidemia.  3. Uterine fibroids.   OBSTETRICAL HISTORY:  Reveals 1 pregnancy and an abortion as a teenager.  She has also up to speed with her age-appropriate cancer screening.  She  reports that she had a mammogram as well as a Pap smear and she is up to  speed from that standpoint.   FAMILY HISTORY:  Unremarkable for any deep vein thrombosis, pulmonary  embolus, pregnancy complications, or any blood disorders.   ALLERGIES:  None.   MEDICATIONS:  Upon discharge on May 01, 2008 including iron  supplementation, Prevacid, Benicar, pravastatin, Coumadin, and Micronor  medication in the hospital at this time is heparin, Protonix, Benicar,  and simvastatin, Xanax, Morphine, and Zofran.   SOCIAL HISTORY:  She is single.  She worked at the [employer].  She does not  report any alcohol or tobacco abuse.   PHYSICAL EXAMINATION:  GENERAL:  Alert, awake female, appeared in no  active distress.   VITALS SIGNS:  Blood pressure was 104/67, pulse 62, respirations 18,  temperature is 98.2.  She is sating 98% on room air.  She weighs 78.8  kg, and ECOG performance status is 0.  HEENT:  Head, normocephalic and atraumatic.  Pupils equal, round, and  reactive to light.  Oral mucosa is moist and pink.  NECK:  Supple.  No lymphadenopathy.  HEART:  Regular  rate and rhythm.  S1 and S2.  LUNGS:  Clear to auscultation.  No rhonchi, wheezes, or rales to  percussion.  ABDOMEN:  Soft and nontender.  No hepatosplenomegaly.  EXTREMITIES:  No edema.   LABORATORY DATA:  Discussed in the history of present illness.  Her  hemoglobin now is 10.8, white cell count of 8.1, and platelet count of  214.  Creatinine of 0.8, potassium of 3.0.  Hypercoagulable panel was  discussed in the history of present illness.  INR is 1.6.   IMPRESSION:  This is a 55 year old female with the following issues:  1. History of uterine fibroids, had been on oral contraceptives,      initially estrogen containing and most recently progesterone      containing.  2. Acute deep vein thrombosis on her right lower extremity diagnosed      in April 26, 2008 and discharged on Coumadin on May 01, 2008.  3. Acute pulmonary embolus diagnosed on May 06, 2008 with an INR of      2.0 and CT scan showed a saddle pulmonary embolus.  4. Negative hypercoagulable workup for any inherited thrombophilia      without any clear cut evidence of any malignancy anywhere.   RECOMMENDATIONS:  My recommendation would be at this point.  1. I agree with an IVC filter placement at this time.  2. I would discontinue oral contraceptives altogether.  I feel that      they are certainly increasing her risk of thrombosis and in her      setting of both acute DVT and pulmonary embolus that is rather      extensive, I would be inclined to stop even progesterone-containing      oral contraceptives.  3. In terms of her longterm anticoagulation, I agree with  intravenous      heparin right now and as an outpatient, she will probably require      low-molecular-weight heparin in the form of Lovenox or other low-      molecular-weight heparin of choice for a period of at least 6      months.  4. We would like to obtain a CT scan of the abdomen and pelvis to rule      out any intraabdominal process that could be contributing to these      clots such as venous malformation or malignancy.  5. We will complete the hypercoagulable workup that is missing beta 2      glycoprotein antibodies and factor 8 levels.  Again, I do not think      there is an inherited thrombophilia here.  I think this is probably      all related hormones at this time but for completeness, I would      like to obtain those at this time.  6. In terms of her menorrhagia due to her uterine fibroids, her      hemoglobin is stable at this time.  I agree with iron      supplementation.  I would certainly agree with a future      hysterectomy if the patient is agreeable to it, it sounds she is,      probably would anticipate that will be done, not in the acute      setting but I think in the near future especially if she has any      recurrent GYN bleed off oral contraceptives.   Thank you for allowing to participate in her care.  ______________________________  Blenda Nicely [facility]  Electronically Signed     FNS/MEDQ  D:  05/08/2008  T:  05/09/2008  Job:  [id number]   cc:   Sheliah Mends, MD  Carren Rang, M.D.  Carrington Clamp, M.D.

## 2010-06-26 NOTE — H&P (Signed)
[name], [name]               ACCOUNT NO.:  [id number]   MEDICAL RECORD NO.:  [id number]          PATIENT TYPE:  INP   LOCATION:                               FACILITY:  [facility]   PHYSICIAN:  Sheliah Mends, MD      DATE OF BIRTH:  04/28/1955   DATE OF ADMISSION:  04/26/2008  DATE OF DISCHARGE:                              HISTORY & PHYSICAL   IDENTIFICATION:  A 55 year old lady with DVT.   HISTORY OF PRESENT ILLNESS:  [name] is a 55 year old lady who  presented with a history of right leg heaviness, leg pain and swelling  to Dr. Carren Rang at Urgent Care.  She was subsequently referred for  a venous ultrasound of the lower extremities which was positive for a  DVT involving the superficial femoral, popliteal, posterior tibial and  peroneal veins.  Duplex Doppler on the left did not show evidence of  thrombosis or trauma phlebitis.  [name] risk factors for DVT  include use of oral contraception with estrogen-containing medication.  [name] has no prior history of DVT or hypercoagulability.  She has  multiple risk factors for coronary artery disease including  hypertension, dyslipidemia and obesity.   PAST MEDICAL HISTORY:  1. Dyslipidemia.  2. Hypertension.  3. Obesity.  4. Anemia.  5. Fibroid tumors.   MEDICATIONS:  1. Pravastatin 20 mg p.o. daily.  2. Prevacid 40 mg p.r.n.  3. Benicar 20 mg p.o. daily.  4. Iron OTC 65 mg p.o. daily.  5. B12 injection.  6. Aviane BC 1 tablet p.o. daily.   ALLERGIES:  NO KNOWN DRUG ALLERGIES.   SOCIAL HISTORY:  Patient is single and has no children.  She has no  history of tobacco or alcohol abuse.   FAMILY HISTORY:  The patient's mother is alive at age 29 and has a  history of dyslipidemia and high blood pressure.  The patient's father  died in an accident.   REVIEW OF SYSTEMS:  Positive as above, otherwise negative.   PHYSICAL EXAMINATION:  GENERAL:  The patient is alert and oriented x3.  VITAL SIGNS:  Blood  pressure 144/80, heart rate 91, weight 183.  NECK:  Supple.  Normal JVP.  No carotid bruit.  CHEST/LUNGS:  Clear to auscultation bilaterally.  No rales or wheezes.  HEART:  Regular rate and rhythm.  No rub, murmur, gallop.  ABDOMEN:  Soft, nontender, nondistended.  Positive bowel sounds.  EXTREMITIES:  No edema.   IMPRESSION:  1. Deep venous thrombosis of the right lower extremity.  2. Dyslipidemia.  3. Hypertension.  4. History of fibroid tumors and heavy menstrual bleed.   PLAN:  The patient will be admitted to the telemetry ward at [facility] and started on heparin protocol and subsequently Coumadin.  A  hypercoagulability panel will be obtained on admission.  The patient was  treated with Aviane for history of fibroid tumors and heavy menstrual  bleeding.  I contacted Dr. Henderson Cloud from [facility] and  discussed the patient's presentation.  Dr. Henderson Cloud recommended to  discontinue Aviane and replace it with  Micronor, a progesterone-  containing preparation.  [name] may need a hysterectomy at a later  time point for her history of fibroid tumors and will be followed by Dr.  Henderson Cloud.   I discussed the results of all workup and the need for admission with  the patient who agrees.  She will be admitted as a full code.  Thank you  for allowing me to assist in the care of this nice lady.      Sheliah Mends, MD  Electronically Signed     JE/MEDQ  D:  04/26/2008  T:  04/26/2008  Job:  [id number]

## 2010-06-26 NOTE — Discharge Summary (Signed)
[name], [name]               ACCOUNT NO.:  [id number]   MEDICAL RECORD NO.:  [id number]          PATIENT TYPE:  INP   LOCATION:  3732                         FACILITY:  [facility]   PHYSICIAN:  Sheliah Mends, MD      DATE OF BIRTH:  Aug 12, 1955   DATE OF ADMISSION:  05/06/2008  DATE OF DISCHARGE:  05/13/2008                               DISCHARGE SUMMARY   CONSULTS:  Blenda Nicely. Shadad, Oncology/Hematology.   DISCHARGE DIAGNOSES:  1. Saddle emboli on full-dose Coumadin.  2. Right lower extremity deep vein thrombosis with admission from      April 26, 2008 to May 01, 2008.  3. Elevated factor VIII, unclear significance.  4. Status post intraventricular catheter filter placement May 09, 2008.  5. Uterine fibroids, eventually will need a hysterectomy.  6. Chronic anemia.  7. Coumadin failure with the occurrence of pulmonary emboli and on      full dose Coumadin, now needs Lovenox x6 months.   LABORATORY DATA:  Hemoglobin 10.7, hematocrit 31.3, WBCs 8.0, platelets  212, and MCV is 92.4.  Beta 2 glycoprotein is within normal limits.  Sodium 138, potassium 3.67, BUN 8, creatinine 0.90, and glucose is 88.  Histoplasma antigen is negative.  Factor VIII activity is 339 and normal  range is 73-140.  Admission INR was 2.0.  The following day, INR was 2.2  in our office.  On May 03, 2008, her INR was 3.6.   RADIOLOGY:  CT of her chest on May 06, 2008 showed saddle pulmonary  embolus involved both main pulmonary arteries and lobar and segmental  pulmonary arteries to the left upper lobe and right upper lobe.  CT of  the abdomen on May 08, 2008 showed a large intrauterine mass 7.6 x 7.9  x 7.3 and a 2.4 x 3.3-cm subserosal fibroid anterolaterally on the left.  The results were called to Dr. Henderson Cloud by Dr. Susa Griffins and this  was discussed.  CT of the chest on May 12, 2008, revealed clot burden  appeared to be decreased.  No evidence of pulmonary infarction or other  abnormality.  The large clot identified the pulmonary outflow track and  left pulmonary artery was no longer seen.  The clot was best seen in the  right main pulmonary artery extending into the superior and inferior  intralobular branches.  No known embolus was identified.  She had a  small hiatal hernia.   HOSPITAL COURSE:  [name] is a 55 year old African American female  who came to the hospital by our request because of saddle pulmonary  emboli noted on CT of her chest.  She apparently was discharged home  after having a DVT in her right lower extremity, which was a rather  extensive.  She had been therapeutic 2 days prior to her discharge.  She  was discharged on Sunday.  On Tuesday, she had symptoms of palpitations  and shortness of breath.  EMS was called.  Her blood pressure and heart  rate were elevated, but resolved spontaneously.  She did not go to the  hospital.  She was seen by her GYN, Dr. Henderson Cloud and then sent to Dr.  Alanda Amass who sent her for a CT of her chest from the office.  This  revealed a saddle pulmonary emboli and she was then admitted to the  hospital.  She was placed on IV heparin.  She had previously had a  hypercoagulable panel performed.  This, I believe showed no significant  abnormalities.  We called a Hematology consult.  She was seen by Dr.  Clelia Croft.  He felt that she was a Coumadin failure that he agreed that she  needed an IVC filter and he felt that her Micronor, which she had been  put on instead of birth control pill for her uterine fibroids should be  discontinued altogether.  He also thought she should be put on Lovenox  due to her Coumadin failure.  A CT of her abdomen and pelvis was  performed.  The results are as above and Dr. Alanda Amass called Dr.  Henderson Cloud with these results, which is her gynecologist.  She had her IVC  filter was placed by Interventional Radiology on May 09, 2008.  She  was switched from heparin to Lovenox on May 09, 2008.  Case manager  was contacted because it was felt that she would need Lovenox x6 months  by hematology.  Case manager contacted her insurance company and she was  eventually able to obtain the Lovenox at a affordable price.  It was  decided that her CT of her chest should be repeated prior to her  discharge to make sure the embolus was resolving.  This was performed  and it did in fact show that the embolus was appeared to be shrinking.  At the time of discharge, the patient did confided that she had gone  home and done too much and in fact had to slam on her breaks with her  right leg hard, when she was out shopping prior to her symptoms  beginning.  At this discharge, she will go home and stay with the family  member to try to lessen her activity burden.  Dr. Clelia Croft again saw her  on May 13, 2008.  He reviewed the recent coagulable labs that he had  drawn including Factor VIII.  He was unclear that her elevated factor  VIII was significant.  He will follow up with her at the [facility] in 2-3 weeks.  They will call her for an appointment.  He also  stated that he would consider Coumadin with INRs in the next 1-2 months,  but he felt that she should still have Lovenox for 6 months.   DISCHARGE MEDICATIONS:  1. Metoprolol XL 25 mg a day.  2. Lovenox 120 mg subcutaneous injection daily at the same time every      day, iron daily as taken previously.  3. Pravastatin 20 mg a day.  4. Prilosec 20 mg a day.  5. Xanax 0.25 mg p.r.n.   Our office will call her to have an appointment with Dr. Garen Lah in the  next 1-2 weeks.  She should do no strenuous activity, pushing, pulling,  lifting, or driving, until she is seen by Dr. Garen Lah.      [name], N.P.      Sheliah Mends, MD  Electronically Signed    BB/MEDQ  D:  05/13/2008  T:  05/14/2008  Job:  [id number]   cc:   Blenda Nicely. Lorelle Formosa, M.D.  Sarina Ser  Felton Clinton, MD

## 2010-08-24 ENCOUNTER — Other Ambulatory Visit: Payer: Self-pay | Admitting: Oncology

## 2010-08-24 ENCOUNTER — Encounter (HOSPITAL_BASED_OUTPATIENT_CLINIC_OR_DEPARTMENT_OTHER): Payer: Federal, State, Local not specified - PPO | Admitting: Oncology

## 2010-08-24 DIAGNOSIS — Z7901 Long term (current) use of anticoagulants: Secondary | ICD-10-CM

## 2010-08-24 DIAGNOSIS — Z86718 Personal history of other venous thrombosis and embolism: Secondary | ICD-10-CM

## 2010-08-24 DIAGNOSIS — D508 Other iron deficiency anemias: Secondary | ICD-10-CM

## 2010-08-24 DIAGNOSIS — D509 Iron deficiency anemia, unspecified: Secondary | ICD-10-CM

## 2010-08-24 LAB — CBC WITH DIFFERENTIAL/PLATELET
Basophils Absolute: 0 10*3/uL (ref 0.0–0.1)
EOS%: 2.4 % (ref 0.0–7.0)
Eosinophils Absolute: 0.1 10*3/uL (ref 0.0–0.5)
HCT: 37.3 % (ref 34.8–46.6)
HGB: 12.4 g/dL (ref 11.6–15.9)
LYMPH%: 47.1 % (ref 14.0–49.7)
MCH: 33 pg (ref 25.1–34.0)
MCV: 99.2 fL (ref 79.5–101.0)
MONO%: 6.8 % (ref 0.0–14.0)
NEUT%: 43.1 % (ref 38.4–76.8)
Platelets: 177 10*3/uL (ref 145–400)

## 2010-08-24 LAB — FERRITIN: Ferritin: 21 ng/mL (ref 10–291)

## 2010-11-21 ENCOUNTER — Encounter: Payer: Self-pay | Admitting: *Deleted

## 2010-12-18 ENCOUNTER — Other Ambulatory Visit: Payer: Self-pay | Admitting: Obstetrics and Gynecology

## 2010-12-18 ENCOUNTER — Other Ambulatory Visit (HOSPITAL_COMMUNITY)
Admission: RE | Admit: 2010-12-18 | Discharge: 2010-12-18 | Disposition: A | Discharge status: Home / Self Care | Payer: Federal, State, Local not specified - PPO | Source: Ambulatory Visit | Attending: Obstetrics and Gynecology | Admitting: Obstetrics and Gynecology

## 2010-12-18 DIAGNOSIS — N6019 Diffuse cystic mastopathy of unspecified breast: Secondary | ICD-10-CM

## 2010-12-18 DIAGNOSIS — N6009 Solitary cyst of unspecified breast: Secondary | ICD-10-CM

## 2010-12-18 DIAGNOSIS — Z01419 Encounter for gynecological examination (general) (routine) without abnormal findings: Secondary | ICD-10-CM | POA: Insufficient documentation

## 2010-12-19 ENCOUNTER — Encounter: Payer: Self-pay | Admitting: *Deleted

## 2010-12-25 ENCOUNTER — Ambulatory Visit (HOSPITAL_BASED_OUTPATIENT_CLINIC_OR_DEPARTMENT_OTHER): Payer: Federal, State, Local not specified - PPO | Admitting: Oncology

## 2010-12-25 ENCOUNTER — Other Ambulatory Visit: Payer: Self-pay | Admitting: Oncology

## 2010-12-25 ENCOUNTER — Telehealth: Payer: Self-pay | Admitting: Oncology

## 2010-12-25 ENCOUNTER — Other Ambulatory Visit (HOSPITAL_BASED_OUTPATIENT_CLINIC_OR_DEPARTMENT_OTHER): Payer: Federal, State, Local not specified - PPO | Admitting: Lab

## 2010-12-25 VITALS — BP 135/87 | HR 94 | Temp 97.7°F | Ht 67.5 in | Wt 183.1 lb

## 2010-12-25 DIAGNOSIS — Z7901 Long term (current) use of anticoagulants: Secondary | ICD-10-CM

## 2010-12-25 DIAGNOSIS — I8290 Acute embolism and thrombosis of unspecified vein: Secondary | ICD-10-CM

## 2010-12-25 DIAGNOSIS — Z86718 Personal history of other venous thrombosis and embolism: Secondary | ICD-10-CM

## 2010-12-25 DIAGNOSIS — Z86711 Personal history of pulmonary embolism: Secondary | ICD-10-CM

## 2010-12-25 DIAGNOSIS — D509 Iron deficiency anemia, unspecified: Secondary | ICD-10-CM

## 2010-12-25 LAB — CBC WITH DIFFERENTIAL/PLATELET
BASO%: 0.6 % (ref 0.0–2.0)
Eosinophils Absolute: 0.1 10*3/uL (ref 0.0–0.5)
HCT: 36.1 % (ref 34.8–46.6)
MCHC: 33.8 g/dL (ref 31.5–36.0)
MONO#: 0.3 10*3/uL (ref 0.1–0.9)
NEUT#: 2.3 10*3/uL (ref 1.5–6.5)
NEUT%: 47.1 % (ref 38.4–76.8)
WBC: 4.9 10*3/uL (ref 3.9–10.3)
lymph#: 2.2 10*3/uL (ref 0.9–3.3)
nRBC: 0 % (ref 0–0)

## 2010-12-25 LAB — IRON AND TIBC
Iron: 39 ug/dL — ABNORMAL LOW (ref 42–145)
TIBC: 327 ug/dL (ref 250–470)
UIBC: 288 ug/dL (ref 125–400)

## 2010-12-25 NOTE — Telephone Encounter (Signed)
gv pt appt schedule for may.  °

## 2010-12-25 NOTE — Progress Notes (Signed)
Hematology and Oncology Follow Up Visit  [name] [id number] 1956-02-08 56 y.o. 12/25/2010 4:18 PM  Sheliah Mends, MD  Kristie Cowman, MD  Carrington Clamp, M.D.    Principle Diagnosis:  A 55 year old female with the following issues: 1. Deep vein thrombosis diagnosed March 2010, probably provoked due to estrogen replacement.  She was treated with Coumadin and subsequently developed a pulmonary embolus while she was on Coumadin.  Her hypercoagulable workup was essentially unrevealing except for an elevated factor VIII. 2. History of iron deficiency anemia due to heavy menstrual cycles and uterine fibroids.    Current therapy:She is chronically anticoagulated with Lovenox 120 mg subcutaneously since March 2010.  She has an IVC filter as well.   Interim History:    [name] presents today for a followup visit.  Very pleasant 55 year old female with the above history.  She has continued to do very well with Lovenox, without any complications.  She does not report any bleeding.  Does not report any injection-related problems.  Had not had any worsening menstrual bleeding at this point.  Overall she had not had any hospitalization and did not report any illnesses.  Had not reported any epistaxis or hematochezia. She continued to have menstrual cycles but certainly not heavy.  Medications: I have reviewed the patient's current medications. Current outpatient prescriptions:enoxaparin (LOVENOX) 120 MG/0.8ML SOLN, Inject 120 mg into the skin daily.  , Disp: , Rfl: ;  IRON PO, Take 65 mg by mouth 2 (two) times daily.  , Disp: , Rfl: ;  lansoprazole (PREVACID) 30 MG capsule, Take 30 mg by mouth as needed.  , Disp: , Rfl: ;  metoprolol succinate (TOPROL-XL) 25 MG 24 hr tablet, Take 25 mg by mouth daily.  , Disp: , Rfl:  pravastatin (PRAVACHOL) 20 MG tablet, Take 20 mg by mouth daily.  , Disp: , Rfl: ;  candesartan (ATACAND) 4 MG tablet, Take 4 mg by mouth daily.  , Disp: , Rfl:   Allergies:   Allergies  Allergen Reactions  . Advicor   . WGN:[id number] Acid+Aspartame     Past Medical History, Surgical history, Social history, and Family History were reviewed and updated.  Review of Systems: Constitutional:  Negative for fever, chills, night sweats, anorexia, weight loss, pain. Cardiovascular: no chest pain or dyspnea on exertion Respiratory: no cough, shortness of breath, or wheezing Neurological: no TIA or stroke symptoms Dermatological: negative ENT: negative Skin Gastrointestinal: no abdominal pain, change in bowel habits, or black or bloody stools Genito-Urinary: no dysuria, trouble voiding, or hematuria Hematological and Lymphatic: negative Breast: negative for breast lumps Musculoskeletal: negative Remaining ROS negative. Physical Exam: Blood pressure 135/87, pulse 94, temperature 97.7 F (36.5 C), temperature source Oral, height 5' 7.5" (1.715 m), weight 183 lb 1.6 oz (83.054 kg). ECOG:  General appearance: alert Head: Normocephalic, without obvious abnormality, atraumatic Neck: no adenopathy, no carotid bruit, no JVD, supple, symmetrical, trachea midline and thyroid not enlarged, symmetric, no tenderness/mass/nodules Lymph nodes: Cervical, supraclavicular, and axillary nodes normal. Heart:regular rate and rhythm, S1, S2 normal, no murmur, click, rub or gallop Lung:chest clear, no wheezing, rales, normal symmetric air entry, Heart exam - S1, S2 normal, no murmur, no gallop, rate regular Abdomin: soft, non-tender, without masses or organomegaly EXT:no erythema, induration, or nodules   Lab Results: Lab Results  Component Value Date   WBC 8.0 05/12/2008   HGB 12.2 12/25/2010   HCT 36.1 12/25/2010   MCV 96.3 12/25/2010   PLT 176 12/25/2010     Chemistry  Component Value Date/Time   NA 139 04/06/2010 0943   K 4.1 04/06/2010 0943   CL 105 04/06/2010 0943   CO2 24 04/06/2010 0943   BUN 9 04/06/2010 0943   CREATININE 0.88 04/06/2010  0943      Component Value Date/Time   CALCIUM 9.3 04/06/2010 0943   ALKPHOS 65 04/06/2010 0943   AST 14 04/06/2010 0943   ALT 13 04/06/2010 0943   BILITOT 0.3 04/06/2010 0943        Impression and Plan:  This is a 55 year old female with the following issues: 1. Lower extremity deep venous thrombosis followed by a pulmonary embolus.  There is a question whether she had a Coumadin failure.  At this time she has been adequately anticoagulated now for the last 2+ years.  Continued to have continuous discussion with [name], discussing the risks and benefits of continuous anticoagulation versus coming off anticoagulation at this time.  She is more agreeable to stop the Lovenox.  She understands her risks of subsequent thrombotic events are slightly higher than the general population but is definitely at increased risk.  Again, the risks and benefits analysis should really be in favor of her coming off the Lovenox. We will start low dose aspirin 81 mh daily. She is to let me know if she develops signs or symptoms of thrombosis. 2. Menorrhagia.  Seems to be under good control.  Her hemoglobin is excellent.  No evidence of any iron-deficiency anemia.  I am rechecking her iron stores today.  Followup will be in 6 months.      [facility], MD 11/13/20124:18 PM

## 2010-12-31 ENCOUNTER — Ambulatory Visit
Admission: RE | Admit: 2010-12-31 | Discharge: 2010-12-31 | Disposition: A | Discharge status: Home / Self Care | Payer: Federal, State, Local not specified - PPO | Source: Ambulatory Visit | Attending: Obstetrics and Gynecology | Admitting: Obstetrics and Gynecology

## 2010-12-31 ENCOUNTER — Other Ambulatory Visit: Payer: Self-pay | Admitting: Obstetrics and Gynecology

## 2010-12-31 DIAGNOSIS — N6019 Diffuse cystic mastopathy of unspecified breast: Secondary | ICD-10-CM

## 2011-06-19 ENCOUNTER — Other Ambulatory Visit: Payer: Self-pay | Admitting: Obstetrics and Gynecology

## 2011-06-19 DIAGNOSIS — Z1231 Encounter for screening mammogram for malignant neoplasm of breast: Secondary | ICD-10-CM

## 2011-06-27 ENCOUNTER — Other Ambulatory Visit (HOSPITAL_BASED_OUTPATIENT_CLINIC_OR_DEPARTMENT_OTHER): Payer: Federal, State, Local not specified - PPO | Admitting: Lab

## 2011-06-27 ENCOUNTER — Ambulatory Visit (HOSPITAL_BASED_OUTPATIENT_CLINIC_OR_DEPARTMENT_OTHER): Payer: Federal, State, Local not specified - PPO | Admitting: Oncology

## 2011-06-27 ENCOUNTER — Telehealth: Payer: Self-pay | Admitting: *Deleted

## 2011-06-27 VITALS — BP 124/80 | HR 70 | Temp 98.3°F | Ht 67.5 in | Wt 189.0 lb

## 2011-06-27 DIAGNOSIS — I82409 Acute embolism and thrombosis of unspecified deep veins of unspecified lower extremity: Secondary | ICD-10-CM

## 2011-06-27 DIAGNOSIS — D509 Iron deficiency anemia, unspecified: Secondary | ICD-10-CM

## 2011-06-27 DIAGNOSIS — I8290 Acute embolism and thrombosis of unspecified vein: Secondary | ICD-10-CM

## 2011-06-27 DIAGNOSIS — N920 Excessive and frequent menstruation with regular cycle: Secondary | ICD-10-CM

## 2011-06-27 DIAGNOSIS — I2699 Other pulmonary embolism without acute cor pulmonale: Secondary | ICD-10-CM

## 2011-06-27 LAB — COMPREHENSIVE METABOLIC PANEL
ALT: 11 U/L (ref 0–35)
AST: 12 U/L (ref 0–37)
Albumin: 3.5 g/dL (ref 3.5–5.2)
Alkaline Phosphatase: 75 U/L (ref 39–117)
BUN: 13 mg/dL (ref 6–23)
CO2: 25 mEq/L (ref 19–32)
Calcium: 8.9 mg/dL (ref 8.4–10.5)
Chloride: 106 mEq/L (ref 96–112)
Creatinine, Ser: 0.85 mg/dL (ref 0.50–1.10)
Glucose, Bld: 95 mg/dL (ref 70–99)
Potassium: 4.1 mEq/L (ref 3.5–5.3)
Sodium: 139 mEq/L (ref 135–145)
Total Bilirubin: 0.3 mg/dL (ref 0.3–1.2)
Total Protein: 7.5 g/dL (ref 6.0–8.3)

## 2011-06-27 LAB — CBC WITH DIFFERENTIAL/PLATELET
BASO%: 0.9 % (ref 0.0–2.0)
EOS%: 3.1 % (ref 0.0–7.0)
HCT: 35.8 % (ref 34.8–46.6)
LYMPH%: 39.1 % (ref 14.0–49.7)
MCH: 33 pg (ref 25.1–34.0)
MCHC: 33.3 g/dL (ref 31.5–36.0)
MCV: 98.9 fL (ref 79.5–101.0)
MONO#: 0.4 10*3/uL (ref 0.1–0.9)
MONO%: 7.4 % (ref 0.0–14.0)
NEUT%: 49.5 % (ref 38.4–76.8)
Platelets: 165 10*3/uL (ref 145–400)
RBC: 3.62 10*6/uL — ABNORMAL LOW (ref 3.70–5.45)
WBC: 5.8 10*3/uL (ref 3.9–10.3)
nRBC: 1 % — ABNORMAL HIGH (ref 0–0)

## 2011-06-27 NOTE — Telephone Encounter (Signed)
gave patient appointment for 12-2011 printed out calendar and gave to the patient 

## 2011-06-27 NOTE — Progress Notes (Signed)
Hematology and Oncology Follow Up Visit  [name] [id number] Oct 01, 1955 56 y.o. 06/27/2011 4:09 PM  Laura Mends, MD  Kristie Cowman, MD  Carrington Clamp, M.D.    Principle Diagnosis:  A 56 year old female with the following issues: 1. Deep vein thrombosis diagnosed March 2010, probably provoked due to estrogen replacement.  She was treated with Coumadin and subsequently developed a pulmonary embolus while she was on Coumadin.  Her hypercoagulable workup was essentially unrevealing except for an elevated factor VIII. 2. History of iron deficiency anemia due to heavy menstrual cycles and uterine fibroids.  Past Therapy: She was anticoagulated with Lovenox 120 mg subcutaneously since March 2010 till 12/2010.  She has an IVC filter as well.  Current therapy: She is chronically on ASA 81 mg since 12/2010. She is on oral Fe supplement as well.   Interim History:    [name] presents today for a followup visit.  Very pleasant 57 year old female with the above history.  She has continued to do very well with Lovenox, without any complications.  She does not report any excessive bleeding.  No complications from the ASA  Had not had any worsening menstrual bleeding at this point.  Overall she had not had any hospitalization and did not report any illnesses.  Had not reported any epistaxis or hematochezia. No thrombotic episodes noted.   Medications: I have reviewed the patient's current medications. Current outpatient prescriptions:aspirin 81 MG tablet, Take 81 mg by mouth daily., Disp: , Rfl: ;  candesartan (ATACAND) 4 MG tablet, Take 4 mg by mouth daily.  , Disp: , Rfl: ;  IRON PO, Take 65 mg by mouth 2 (two) times daily.  , Disp: , Rfl: ;  lansoprazole (PREVACID) 30 MG capsule, Take 30 mg by mouth as needed.  , Disp: , Rfl: ;  metoprolol succinate (TOPROL-XL) 25 MG 24 hr tablet, Take 25 mg by mouth daily.  , Disp: , Rfl:  enoxaparin (LOVENOX) 120 MG/0.8ML SOLN, Inject 120 mg into the  skin daily.  , Disp: , Rfl: ;  pravastatin (PRAVACHOL) 20 MG tablet, Take 20 mg by mouth daily.  , Disp: , Rfl:   Allergies:  Allergies  Allergen Reactions  . Amoxicillin-Pot Clavulanate   . Niacin-Lovastatin Er     Past Medical History, Surgical history, Social history, and Family History were reviewed and updated.  Review of Systems: Constitutional:  Negative for fever, chills, night sweats, anorexia, weight loss, pain. Cardiovascular: no chest pain or dyspnea on exertion Respiratory: no cough, shortness of breath, or wheezing Neurological: no TIA or stroke symptoms Dermatological: negative ENT: negative Skin Gastrointestinal: no abdominal pain, change in bowel habits, or black or bloody stools Genito-Urinary: no dysuria, trouble voiding, or hematuria Hematological and Lymphatic: negative Breast: negative for breast lumps Musculoskeletal: negative Remaining ROS negative. Physical Exam: Blood pressure 124/80, pulse 70, temperature 98.3 F (36.8 C), temperature source Oral, height 5' 7.5" (1.715 m), weight 189 lb (85.73 kg). ECOG:  General appearance: alert Head: Normocephalic, without obvious abnormality, atraumatic Neck: no adenopathy, no carotid bruit, no JVD, supple, symmetrical, trachea midline and thyroid not enlarged, symmetric, no tenderness/mass/nodules Lymph nodes: Cervical, supraclavicular, and axillary nodes normal. Heart:regular rate and rhythm, S1, S2 normal, no murmur, click, rub or gallop Lung:chest clear, no wheezing, rales, normal symmetric air entry, Heart exam - S1, S2 normal, no murmur, no gallop, rate regular Abdomin: soft, non-tender, without masses or organomegaly EXT:no erythema, induration, or nodules   Lab Results: Lab Results  Component Value Date  WBC 5.8 06/27/2011   HGB 11.9 06/27/2011   HCT 35.8 06/27/2011   MCV 98.9 06/27/2011   PLT 165 06/27/2011     Chemistry      Component Value Date/Time   NA 139 04/06/2010 0943   K 4.1 04/06/2010  0943   CL 105 04/06/2010 0943   CO2 24 04/06/2010 0943   BUN 9 04/06/2010 0943   CREATININE 0.88 04/06/2010 0943      Component Value Date/Time   CALCIUM 9.3 04/06/2010 0943   ALKPHOS 65 04/06/2010 0943   AST 14 04/06/2010 0943   ALT 13 04/06/2010 0943   BILITOT 0.3 04/06/2010 0943        Impression and Plan:  This is a 56 year old female with the following issues: 1. Lower extremity deep venous thrombosis followed by a pulmonary embolus.  There is a question whether she had a Coumadin failure.  At this time was adequately anticoagulated 2+ years with lovenox.  She has been on low dose aspirin 81 mg daily since 12/2010 without complications. She is to let me know if she develops signs or symptoms of thrombosis. 2. Menorrhagia.  Seems to be under good control.  Her hemoglobin is excellent.  No evidence of any iron-deficiency anemia.  I am rechecking her iron stores today.  Followup will be in 6 months.      [facility], MD 5/16/20134:09 PM

## 2011-07-01 ENCOUNTER — Ambulatory Visit
Admission: RE | Admit: 2011-07-01 | Discharge: 2011-07-01 | Disposition: A | Discharge status: Home / Self Care | Payer: Federal, State, Local not specified - PPO | Source: Ambulatory Visit | Attending: Obstetrics and Gynecology | Admitting: Obstetrics and Gynecology

## 2011-07-01 DIAGNOSIS — Z1231 Encounter for screening mammogram for malignant neoplasm of breast: Secondary | ICD-10-CM

## 2011-12-24 ENCOUNTER — Other Ambulatory Visit (HOSPITAL_BASED_OUTPATIENT_CLINIC_OR_DEPARTMENT_OTHER): Payer: Federal, State, Local not specified - PPO

## 2011-12-24 ENCOUNTER — Ambulatory Visit (HOSPITAL_BASED_OUTPATIENT_CLINIC_OR_DEPARTMENT_OTHER): Payer: Federal, State, Local not specified - PPO | Admitting: Oncology

## 2011-12-24 ENCOUNTER — Telehealth: Payer: Self-pay | Admitting: Oncology

## 2011-12-24 VITALS — BP 140/79 | HR 63 | Temp 97.3°F | Resp 20 | Ht 67.5 in | Wt 182.6 lb

## 2011-12-24 DIAGNOSIS — I2699 Other pulmonary embolism without acute cor pulmonale: Secondary | ICD-10-CM

## 2011-12-24 DIAGNOSIS — I82609 Acute embolism and thrombosis of unspecified veins of unspecified upper extremity: Secondary | ICD-10-CM

## 2011-12-24 DIAGNOSIS — I82409 Acute embolism and thrombosis of unspecified deep veins of unspecified lower extremity: Secondary | ICD-10-CM

## 2011-12-24 DIAGNOSIS — N920 Excessive and frequent menstruation with regular cycle: Secondary | ICD-10-CM

## 2011-12-24 DIAGNOSIS — D509 Iron deficiency anemia, unspecified: Secondary | ICD-10-CM

## 2011-12-24 LAB — CBC WITH DIFFERENTIAL/PLATELET
BASO%: 1 % (ref 0.0–2.0)
EOS%: 3.4 % (ref 0.0–7.0)
HCT: 36.1 % (ref 34.8–46.6)
LYMPH%: 39.8 % (ref 14.0–49.7)
MCH: 33.7 pg (ref 25.1–34.0)
MCHC: 34.2 g/dL (ref 31.5–36.0)
NEUT%: 49.2 % (ref 38.4–76.8)
lymph#: 2.2 10*3/uL (ref 0.9–3.3)

## 2011-12-24 LAB — COMPREHENSIVE METABOLIC PANEL (CC13)
ALT: 10 U/L (ref 0–55)
Albumin: 3.8 g/dL (ref 3.5–5.0)
Alkaline Phosphatase: 88 U/L (ref 40–150)
CO2: 25 mEq/L (ref 22–29)
Glucose: 87 mg/dl (ref 70–99)
Potassium: 3.7 mEq/L (ref 3.5–5.1)
Sodium: 137 mEq/L (ref 136–145)
Total Protein: 8.1 g/dL (ref 6.4–8.3)

## 2011-12-24 LAB — FERRITIN: Ferritin: 26 ng/mL (ref 10–291)

## 2011-12-24 NOTE — Telephone Encounter (Signed)
APPTS MADE AND PRINTED FOR PT

## 2011-12-24 NOTE — Progress Notes (Signed)
Hematology and Oncology Follow Up Visit  [name] [id number] [birth year] 56 y.o. 12/24/2011 4:21 PM  Sheliah Mends, MD  Kristie Cowman, MD  Carrington Clamp, M.D.    Principle Diagnosis:  A 56 year old female with the following issues: 1. Deep vein thrombosis diagnosed March 2010, probably provoked due to estrogen replacement.  She was treated with Coumadin and subsequently developed a pulmonary embolus while she was on Coumadin.  Her hypercoagulable workup was essentially unrevealing except for an elevated factor VIII. 2. History of iron deficiency anemia due to heavy menstrual cycles and uterine fibroids.  Past Therapy: She was anticoagulated with Lovenox 120 mg subcutaneously since March 2010 till 12/2010.  She has an IVC filter as well.  Current therapy: She is chronically on ASA 81 mg since 12/2010. She is on oral Fe supplement as well.   Interim History:    Mrs. Kintzel presents today for a followup visit.  Very pleasant 56 year old female with the above history. She has continued to do very well without Lovenox, she is not reporting any complications.  She does not report any excessive bleeding.  No complications from the ASA  Had not had any worsening menstrual bleeding at this point.  Overall she had not had any hospitalization and did not report any illnesses.  Had not reported any epistaxis or hematochezia. No thrombotic episodes noted.   Medications: I have reviewed the patient's current medications. Current outpatient prescriptions:aspirin 81 MG tablet, Take 81 mg by mouth daily., Disp: , Rfl: ;  candesartan (ATACAND) 4 MG tablet, Take 4 mg by mouth daily.  , Disp: , Rfl: ;  IRON PO, Take 65 mg by mouth 2 (two) times daily.  , Disp: , Rfl: ;  lansoprazole (PREVACID) 30 MG capsule, Take 30 mg by mouth as needed.  , Disp: , Rfl: ;  metoprolol succinate (TOPROL-XL) 25 MG 24 hr tablet, Take 25 mg by mouth daily.  , Disp: , Rfl:  pravastatin (PRAVACHOL) 20 MG tablet, Take 20  mg by mouth daily.  , Disp: , Rfl:   Allergies:  Allergies  Allergen Reactions  . Amoxicillin-Pot Clavulanate   . Niacin-Lovastatin Er     Past Medical History, Surgical history, Social history, and Family History were reviewed and updated.  Review of Systems: Constitutional:  Negative for fever, chills, night sweats, anorexia, weight loss, pain. Cardiovascular: no chest pain or dyspnea on exertion Respiratory: no cough, shortness of breath, or wheezing Neurological: no TIA or stroke symptoms Dermatological: negative ENT: negative Skin Gastrointestinal: no abdominal pain, change in bowel habits, or black or bloody stools Genito-Urinary: no dysuria, trouble voiding, or hematuria Hematological and Lymphatic: negative Breast: negative for breast lumps Musculoskeletal: negative Remaining ROS negative. Physical Exam: Blood pressure 140/79, pulse 63, temperature 97.3 F (36.3 C), temperature source Oral, resp. rate 20, height 5' 7.5" (1.715 m), weight 182 lb 9.6 oz (82.827 kg). ECOG: 0 General appearance: alert Head: Normocephalic, without obvious abnormality, atraumatic Neck: no adenopathy, no carotid bruit, no JVD, supple, symmetrical, trachea midline and thyroid not enlarged, symmetric, no tenderness/mass/nodules Lymph nodes: Cervical, supraclavicular, and axillary nodes normal. Heart:regular rate and rhythm, S1, S2 normal, no murmur, click, rub or gallop Lung:chest clear, no wheezing, rales, normal symmetric air entry, Heart exam - S1, S2 normal, no murmur, no gallop, rate regular Abdomin: soft, non-tender, without masses or organomegaly EXT:no erythema, induration, or nodules   Lab Results: Lab Results  Component Value Date   WBC 5.6 12/24/2011   HGB 12.3 12/24/2011   HCT  36.1 12/24/2011   MCV 98.4 12/24/2011   PLT 169 12/24/2011     Chemistry      Component Value Date/Time   NA 137 12/24/2011 1538   NA 139 06/27/2011 1523   K 3.7 12/24/2011 1538   K 4.1 06/27/2011  1523   CL 107 12/24/2011 1538   CL 106 06/27/2011 1523   CO2 25 12/24/2011 1538   CO2 25 06/27/2011 1523   BUN 12.0 12/24/2011 1538   BUN 13 06/27/2011 1523   CREATININE 0.9 12/24/2011 1538   CREATININE 0.85 06/27/2011 1523      Component Value Date/Time   CALCIUM 9.4 12/24/2011 1538   CALCIUM 8.9 06/27/2011 1523   ALKPHOS 88 12/24/2011 1538   ALKPHOS 75 06/27/2011 1523   AST 14 12/24/2011 1538   AST 12 06/27/2011 1523   ALT 10 12/24/2011 1538   ALT 11 06/27/2011 1523   BILITOT 0.37 12/24/2011 1538   BILITOT 0.3 06/27/2011 1523      Impression and Plan:  This is a 56 year old female with the following issues: 1. Lower extremity deep venous thrombosis followed by a pulmonary embolus.  There is a question whether she had a Coumadin failure.  At this time was adequately anticoagulated 2+ years with lovenox.  She has been on low dose aspirin 81 mg daily since 12/2010 without complications. She is to let me know if she develops signs or symptoms of thrombosis. 2. Menorrhagia.  Seems to be under good control.  Her hemoglobin is excellent.  No evidence of any iron-deficiency anemia.  I am rechecking her iron stores today.  Followup will be in 6 months.      [facility], MD 11/12/20134:21 PM

## 2012-06-24 ENCOUNTER — Ambulatory Visit (HOSPITAL_BASED_OUTPATIENT_CLINIC_OR_DEPARTMENT_OTHER): Payer: Federal, State, Local not specified - PPO | Admitting: Oncology

## 2012-06-24 ENCOUNTER — Other Ambulatory Visit (HOSPITAL_BASED_OUTPATIENT_CLINIC_OR_DEPARTMENT_OTHER): Payer: Federal, State, Local not specified - PPO | Admitting: Lab

## 2012-06-24 ENCOUNTER — Telehealth: Payer: Self-pay | Admitting: Oncology

## 2012-06-24 VITALS — BP 142/87 | HR 65 | Temp 98.4°F | Resp 18 | Ht 67.5 in | Wt 183.9 lb

## 2012-06-24 DIAGNOSIS — Z7982 Long term (current) use of aspirin: Secondary | ICD-10-CM

## 2012-06-24 DIAGNOSIS — D509 Iron deficiency anemia, unspecified: Secondary | ICD-10-CM

## 2012-06-24 DIAGNOSIS — Z86711 Personal history of pulmonary embolism: Secondary | ICD-10-CM

## 2012-06-24 DIAGNOSIS — Z86718 Personal history of other venous thrombosis and embolism: Secondary | ICD-10-CM

## 2012-06-24 LAB — CBC WITH DIFFERENTIAL/PLATELET
BASO%: 0.6 % (ref 0.0–2.0)
EOS%: 4.2 % (ref 0.0–7.0)
Eosinophils Absolute: 0.3 10*3/uL (ref 0.0–0.5)
LYMPH%: 41.1 % (ref 14.0–49.7)
MCHC: 33.4 g/dL (ref 31.5–36.0)
MCV: 95.7 fL (ref 79.5–101.0)
MONO%: 6.7 % (ref 0.0–14.0)
NEUT#: 2.8 10*3/uL (ref 1.5–6.5)
RBC: 3.71 10*6/uL (ref 3.70–5.45)
RDW: 13.1 % (ref 11.2–14.5)
WBC: 6 10*3/uL (ref 3.9–10.3)

## 2012-06-24 LAB — COMPREHENSIVE METABOLIC PANEL (CC13)
ALT: 8 U/L (ref 0–55)
AST: 11 U/L (ref 5–34)
Albumin: 3.4 g/dL — ABNORMAL LOW (ref 3.5–5.0)
Alkaline Phosphatase: 86 U/L (ref 40–150)
Glucose: 91 mg/dl (ref 70–99)
Potassium: 4 mEq/L (ref 3.5–5.1)
Sodium: 139 mEq/L (ref 136–145)
Total Bilirubin: 0.24 mg/dL (ref 0.20–1.20)
Total Protein: 7.8 g/dL (ref 6.4–8.3)

## 2012-06-24 NOTE — Progress Notes (Signed)
Hematology and Oncology Follow Up Visit  [name] [id number] 1955-12-27 57 y.o. 06/24/2012 4:26 PM  Sheliah Mends, MD  Kristie Cowman, MD  Carrington Clamp, M.D.    Principle Diagnosis: A 57 year old female with the following issues: 1. Deep vein thrombosis diagnosed March 2010, probably provoked due to estrogen replacement.  She was treated with Coumadin and subsequently developed a pulmonary embolus while she was on Coumadin.  Her hypercoagulable workup was essentially unrevealing except for an elevated factor VIII. 2. History of iron deficiency anemia due to heavy menstrual cycles and uterine fibroids.  Past Therapy: She was anticoagulated with Lovenox 120 mg subcutaneously since March 2010 till 12/2010.  She has an IVC filter as well.  Current therapy: She is chronically on ASA 81 mg since 12/2010. She is on oral Fe supplement as well.   Interim History:   [name] presents today for a followup visit. She is a  very pleasant 57 year old female with the above history. She has continued to do very well without Lovenox, she is not reporting any complications.  She does not report any excessive bleeding.  No complications from the ASA  Had not had any worsening menstrual bleeding at this point.  Overall she had not had any hospitalization and did not report any illnesses.  Had not reported any epistaxis or hematochezia. No thrombotic episodes noted. She still have regular menstrual cycles but no heavy bleeding.   Medications: I have reviewed the patient's current medications. Current Outpatient Prescriptions  Medication Sig Dispense Refill  . aspirin 81 MG tablet Take 81 mg by mouth daily.      . candesartan (ATACAND) 4 MG tablet Take 4 mg by mouth daily.        . IRON PO Take 65 mg by mouth 2 (two) times daily.        . lansoprazole (PREVACID) 30 MG capsule Take 30 mg by mouth as needed.        . metoprolol succinate (TOPROL-XL) 25 MG 24 hr tablet Take 25 mg by mouth daily.         . pravastatin (PRAVACHOL) 20 MG tablet Take 20 mg by mouth daily.         No current facility-administered medications for this visit.    Allergies:  Allergies  Allergen Reactions  . Amoxicillin-Pot Clavulanate   . Niacin-Lovastatin Er     Past Medical History, Surgical history, Social history, and Family History were reviewed and updated.  Review of Systems: Constitutional:  Negative for fever, chills, night sweats, anorexia, weight loss, pain. Cardiovascular: no chest pain or dyspnea on exertion Respiratory: no cough, shortness of breath, or wheezing Neurological: no TIA or stroke symptoms Dermatological: negative ENT: negative Skin Gastrointestinal: no abdominal pain, change in bowel habits, or black or bloody stools Genito-Urinary: no dysuria, trouble voiding, or hematuria Hematological and Lymphatic: negative Breast: negative for breast lumps Musculoskeletal: negative Remaining ROS negative. Physical Exam: Blood pressure 142/87, pulse 65, temperature 98.4 F (36.9 C), temperature source Oral, resp. rate 18, height 5' 7.5" (1.715 m), weight 183 lb 14.4 oz (83.416 kg). ECOG: 0 General appearance: alert Head: Normocephalic, without obvious abnormality, atraumatic Neck: no adenopathy, no carotid bruit, no JVD, supple, symmetrical, trachea midline and thyroid not enlarged, symmetric, no tenderness/mass/nodules Lymph nodes: Cervical, supraclavicular, and axillary nodes normal. Heart:regular rate and rhythm, S1, S2 normal, no murmur, click, rub or gallop Lung:chest clear, no wheezing, rales, normal symmetric air entry, Heart exam - S1, S2 normal, no murmur, no gallop, rate  regular Abdomin: soft, non-tender, without masses or organomegaly EXT:no erythema, induration, or nodules   Lab Results: Lab Results  Component Value Date   WBC 6.0 06/24/2012   HGB 11.8 06/24/2012   HCT 35.5 06/24/2012   MCV 95.7 06/24/2012   PLT 191 06/24/2012     Chemistry      Component Value  Date/Time   NA 139 06/24/2012 1548   NA 139 06/27/2011 1523   K 4.0 06/24/2012 1548   K 4.1 06/27/2011 1523   CL 105 06/24/2012 1548   CL 106 06/27/2011 1523   CO2 26 06/24/2012 1548   CO2 25 06/27/2011 1523   BUN 10.9 06/24/2012 1548   BUN 13 06/27/2011 1523   CREATININE 0.9 06/24/2012 1548   CREATININE 0.85 06/27/2011 1523      Component Value Date/Time   CALCIUM 9.0 06/24/2012 1548   CALCIUM 8.9 06/27/2011 1523   ALKPHOS 86 06/24/2012 1548   ALKPHOS 75 06/27/2011 1523   AST 11 06/24/2012 1548   AST 12 06/27/2011 1523   ALT 8 06/24/2012 1548   ALT 11 06/27/2011 1523   BILITOT 0.24 06/24/2012 1548   BILITOT 0.3 06/27/2011 1523      Impression and Plan:  This is a 57 year old female with the following issues: 1. Lower extremity deep venous thrombosis followed by a pulmonary embolus.  There is a question whether she had a Coumadin failure.  At this time was adequately anticoagulated for more than three years with lovenox.  She has been on low dose aspirin 81 mg daily since 12/2010 without complications. She is to let me know if she develops signs or symptoms of thrombosis. 2. Menorrhagia.  Seems to be under good control.  Her hemoglobin is excellent.  No evidence of any iron-deficiency anemia.  I am rechecking her iron stores today.  Followup will be in 6 months.      [facility], MD 5/14/20144:26 PM

## 2012-06-24 NOTE — Telephone Encounter (Signed)
Gave pt appt for November 2014 lab and MD °

## 2012-07-01 ENCOUNTER — Other Ambulatory Visit: Payer: Self-pay

## 2012-07-01 DIAGNOSIS — Z1231 Encounter for screening mammogram for malignant neoplasm of breast: Secondary | ICD-10-CM

## 2012-07-30 ENCOUNTER — Ambulatory Visit
Admission: RE | Admit: 2012-07-30 | Discharge: 2012-07-30 | Disposition: A | Discharge status: Home / Self Care | Payer: Federal, State, Local not specified - PPO | Source: Ambulatory Visit

## 2012-07-30 DIAGNOSIS — Z1231 Encounter for screening mammogram for malignant neoplasm of breast: Secondary | ICD-10-CM

## 2012-08-03 ENCOUNTER — Other Ambulatory Visit: Payer: Self-pay | Admitting: Obstetrics and Gynecology

## 2012-08-03 DIAGNOSIS — R928 Other abnormal and inconclusive findings on diagnostic imaging of breast: Secondary | ICD-10-CM

## 2012-08-18 ENCOUNTER — Ambulatory Visit
Admission: RE | Admit: 2012-08-18 | Discharge: 2012-08-18 | Disposition: A | Discharge status: Home / Self Care | Payer: Federal, State, Local not specified - PPO | Source: Ambulatory Visit | Attending: Obstetrics and Gynecology | Admitting: Obstetrics and Gynecology

## 2012-08-18 DIAGNOSIS — R928 Other abnormal and inconclusive findings on diagnostic imaging of breast: Secondary | ICD-10-CM

## 2012-09-18 ENCOUNTER — Encounter: Payer: Self-pay | Admitting: Neurology

## 2012-09-22 ENCOUNTER — Ambulatory Visit (INDEPENDENT_AMBULATORY_CARE_PROVIDER_SITE_OTHER): Payer: Federal, State, Local not specified - PPO | Admitting: Neurology

## 2012-09-22 ENCOUNTER — Encounter: Payer: Self-pay | Admitting: Neurology

## 2012-09-22 VITALS — BP 128/80 | HR 72 | Ht 66.6 in | Wt 185.0 lb

## 2012-09-22 DIAGNOSIS — R471 Dysarthria and anarthria: Secondary | ICD-10-CM

## 2012-09-22 DIAGNOSIS — R51 Headache: Secondary | ICD-10-CM

## 2012-09-22 DIAGNOSIS — R519 Headache, unspecified: Secondary | ICD-10-CM | POA: Insufficient documentation

## 2012-09-22 HISTORY — DX: Headache: R51

## 2012-09-22 HISTORY — DX: Dysarthria and anarthria: R47.1

## 2012-09-22 NOTE — Progress Notes (Signed)
Reason for visit: Slurred speech  [name] is a 57 y.o. female  History of present illness:  [name] is a 57 year old right-handed black female with a history of problems over the last 2 months with slurred speech and left face and arm sensory changes. The patient indicates that the episodes will come and go, and are not persistent. The patient has had some improvement since she has gone on vitamin D supplementation of the last 2 or 3 weeks. The patient also has a relatively frequent headaches associated with migraine that are in the frontal aspects of the head, oftentimes activated by perfumes or other odors. The patient may have some occasional dizziness. The patient does not relate the episodes of headache to the episodes of slurred speech. The patient denies any visual changes, problems with chewing or swallowing, or problems with weakness of the extremities. The patient has some mild gait instability, but she denies problems controlling the bowels or the bladder. The patient reports a prior history of a deep venous thrombosis associated with birth control pills, associated as well with a pulmonary embolism. The patient is sent to this office for an evaluation.  Past Medical History  Diagnosis Date  . Hypertension   . Hyperlipemia   . Uterine fibroid   . DVT (deep venous thrombosis)     04/2010  . Iron deficiency anemia   . Headache(784.0) 09/22/2012  . Pulmonary embolism   . Vitamin D deficiency     Past Surgical History  Procedure Laterality Date  . Dilation and curettage of uterus      01/2008  . Cholecystectomy    . Pulmonary embolism surgery    . Ivc filter      Family History  Problem Relation Age of Onset  . Drug abuse Brother   . Hypertension Mother     Social history:  reports that she quit smoking about 10 years ago. Her smoking use included Cigarettes. She has a 4 pack-year smoking history. She has never used smokeless tobacco. She reports that  drinks  alcohol. She reports that she does not use illicit drugs.  Medications:  Current Outpatient Prescriptions on File Prior to Visit  Medication Sig Dispense Refill  . aspirin 81 MG tablet Take 81 mg by mouth daily.      . candesartan (ATACAND) 4 MG tablet Take 4 mg by mouth daily.        . IRON PO Take 65 mg by mouth 2 (two) times daily.        . lansoprazole (PREVACID) 30 MG capsule Take 30 mg by mouth as needed.        . metoprolol succinate (TOPROL-XL) 25 MG 24 hr tablet Take 25 mg by mouth daily.        . pravastatin (PRAVACHOL) 20 MG tablet Take 20 mg by mouth daily.         No current facility-administered medications on file prior to visit.    Allergies:  Allergies  Allergen Reactions  . Amoxicillin-Pot Clavulanate   . Niacin-Lovastatin Er     ROS:  Out of a complete 14 system review of symptoms, the patient complains only of the following symptoms, and all other reviewed systems are negative.  Fatigue Heart murmur Shortness of breath Recent urinary tract infection Headache, weakness, slurred speech, dizziness Anxiety, insomnia, decreased energy   Blood pressure 128/80, pulse 72, height 5' 6.6" (1.692 m), weight 185 lb (83.915 kg), last menstrual period 06/18/2010.  Physical Exam  General:  The patient is alert and cooperative at the time of the examination.  Head: Pupils are equal, round, and reactive to light. Discs are flat bilaterally.  Neck: The neck is supple, no carotid bruits are noted.  Respiratory: The respiratory examination is clear.  Cardiovascular: The cardiovascular examination reveals a regular rate and rhythm, no obvious murmurs or rubs are noted.  Skin: Extremities are without significant edema.  Neurologic Exam  Mental status:  Cranial nerves: Facial symmetry is present. There is good sensation of the face to pinprick and soft touch bilaterally, with the exception that there is a mild decrease in pinprick sensation on the left forehead,  normal on the lower face on the left . The strength of the facial muscles and the muscles to head turning and shoulder shrug are normal bilaterally. Speech is well enunciated, no aphasia or dysarthria is noted. Extraocular movements are full. Visual fields are full.  Motor: The motor testing reveals 5 over 5 strength of all 4 extremities. Good symmetric motor tone is noted throughout.  Sensory: Sensory testing is intact to pinprick, soft touch, vibration sensation, and position sense on all 4 extremities. No evidence of extinction is noted.  Coordination: Cerebellar testing reveals good finger-nose-finger and heel-to-shin bilaterally.  Gait and station: Gait is normal. Tandem gait is normal. Romberg is negative. No drift is seen.  Reflexes: Deep tendon reflexes are symmetric and normal bilaterally. Toes are downgoing bilaterally.   Assessment/Plan:   1. Episodic slurred speech, left face and arm sensory alteration  2. Migraine headache  Clinical examination at this time is normal. The patient will be set up for MRI evaluation of the brain. If this is unremarkable, we will watch this patient conservatively. If white matter abnormalities are seen, a further workup may be done to include a transcranial Doppler bubble study, and further blood work. In the past, the patient has been evaluated through hematology, and no evidence of a hypercoagulable state has been found.  Marlan Palau MD 09/22/2012 7:17 PM  [facility] [address] [place], [place] [address]  Phone [phone] Fax [phone]

## 2012-10-01 ENCOUNTER — Other Ambulatory Visit: Payer: Self-pay | Admitting: Obstetrics and Gynecology

## 2012-10-07 ENCOUNTER — Other Ambulatory Visit: Payer: Self-pay | Admitting: Obstetrics and Gynecology

## 2012-10-07 DIAGNOSIS — R921 Mammographic calcification found on diagnostic imaging of breast: Secondary | ICD-10-CM

## 2012-11-12 ENCOUNTER — Ambulatory Visit (INDEPENDENT_AMBULATORY_CARE_PROVIDER_SITE_OTHER): Payer: Federal, State, Local not specified - PPO

## 2012-11-12 DIAGNOSIS — R471 Dysarthria and anarthria: Secondary | ICD-10-CM

## 2012-11-12 DIAGNOSIS — R51 Headache: Secondary | ICD-10-CM

## 2012-11-13 ENCOUNTER — Telehealth: Payer: Self-pay | Admitting: Neurology

## 2012-11-13 DIAGNOSIS — R9082 White matter disease, unspecified: Secondary | ICD-10-CM

## 2012-11-13 NOTE — Telephone Encounter (Signed)
I called patient. The MRI the brain shows nonspecific white matter changes. Given the history of the pulmonary embolism, we will check a transcranial Doppler bubble study to exclude the possibility of a PFO.

## 2012-12-04 ENCOUNTER — Ambulatory Visit (INDEPENDENT_AMBULATORY_CARE_PROVIDER_SITE_OTHER): Payer: Federal, State, Local not specified - PPO | Admitting: Neurology

## 2012-12-04 ENCOUNTER — Ambulatory Visit (INDEPENDENT_AMBULATORY_CARE_PROVIDER_SITE_OTHER): Payer: Self-pay

## 2012-12-04 ENCOUNTER — Encounter: Payer: Self-pay | Admitting: Neurology

## 2012-12-04 VITALS — BP 127/89 | HR 80 | Ht 67.5 in | Wt 188.0 lb

## 2012-12-04 DIAGNOSIS — R51 Headache: Secondary | ICD-10-CM

## 2012-12-04 DIAGNOSIS — R9082 White matter disease, unspecified: Secondary | ICD-10-CM

## 2012-12-04 DIAGNOSIS — I10 Essential (primary) hypertension: Secondary | ICD-10-CM

## 2012-12-04 DIAGNOSIS — Z0289 Encounter for other administrative examinations: Secondary | ICD-10-CM

## 2012-12-04 DIAGNOSIS — G459 Transient cerebral ischemic attack, unspecified: Secondary | ICD-10-CM

## 2012-12-04 DIAGNOSIS — G9389 Other specified disorders of brain: Secondary | ICD-10-CM

## 2012-12-04 NOTE — Progress Notes (Signed)
TCD Bubble study and Consult Note Rererring MD : [name]  Reason for visit: Slurred speech  [name] is a 57 y.o. female  History of present illness:  [name] is a 57 year old right-handed black female with a history of problems over the last 2 months with slurred speech and left face and arm sensory changes. The patient indicates that the episodes will come and go, and are not persistent. The patient has had some improvement since she has gone on vitamin D supplementation of the last 2 or 3 weeks. The patient also has a relatively frequent headaches associated with migraine that are in the frontal aspects of the head, oftentimes activated by perfumes or other odors. The patient may have some occasional dizziness. The patient does not relate the episodes of headache to the episodes of slurred speech. The patient denies any visual changes, problems with chewing or swallowing, or problems with weakness of the extremities. The patient has some mild gait instability, but she denies problems controlling the bowels or the bladder. The patient reports a prior history of a deep venous thrombosis associated with birth control pills, associated as well with a pulmonary embolism.  12/04/12 she states that she's not had any further episodes of slurred speech or dizziness after her blood pressure medicine was changed by her primary physician. She does have history of migraine headaches and in the remote past but denies significant recent headaches. She underwent MRI scan of the brain on 11/12/12 which showed nonspecific ventricle and subcortical white matter hyperintensities.  Past Medical History  Diagnosis Date  . Hypertension   . Hyperlipemia   . Uterine fibroid   . DVT (deep venous thrombosis)     04/2010  . Iron deficiency anemia   . Headache(784.0) 09/22/2012  . Pulmonary embolism   . Vitamin D deficiency     Past Surgical History  Procedure Laterality Date  . Dilation and curettage  of uterus      01/2008  . Cholecystectomy    . Pulmonary embolism surgery    . Ivc filter      Family History  Problem Relation Age of Onset  . Drug abuse Brother   . Hypertension Mother     Social history:  reports that she quit smoking about 10 years ago. Her smoking use included Cigarettes. She has a 4 pack-year smoking history. She has never used smokeless tobacco. She reports that she drinks alcohol. She reports that she does not use illicit drugs.  Medications:  Current Outpatient Prescriptions on File Prior to Visit  Medication Sig Dispense Refill  . aspirin 81 MG tablet Take 81 mg by mouth daily.      . candesartan (ATACAND) 4 MG tablet Take 4 mg by mouth daily.        . IRON PO Take 65 mg by mouth 2 (two) times daily.        . lansoprazole (PREVACID) 30 MG capsule Take 30 mg by mouth as needed.         No current facility-administered medications on file prior to visit.    Allergies:  Allergies  Allergen Reactions  . Amoxicillin-Pot Clavulanate   . Niacin-Lovastatin Er     ROS:  Out of a complete 14 system review of symptoms, the patient complains only of the following symptoms, and all other reviewed systems are negative.  Fatigue, palpitations, itching, double vision, headache, dizziness, anxiety and sleepiness  Blood pressure 127/89, pulse 80, height 5' 7.5" (1.715 m), weight 188  lb (85.276 kg), last menstrual period 06/18/2010.  Physical Exam  General: The patient is alert and cooperative at the time of the examination.  Head: Pupils are equal, round, and reactive to light. Discs are flat bilaterally.  Neck: The neck is supple, no carotid bruits are noted.  Respiratory: The respiratory examination is clear.  Cardiovascular: The cardiovascular examination reveals a regular rate and rhythm, no obvious murmurs or rubs are noted.  Skin: Extremities are without significant edema.  Neurologic Exam  Mental status:  Cranial nerves: Facial symmetry is  present. There is good sensation of the face to pinprick and soft touch bilaterally, with the exception that there is a mild decrease in pinprick sensation on the left forehead, normal on the lower face on the left . The strength of the facial muscles and the muscles to head turning and shoulder shrug are normal bilaterally. Speech is well enunciated, no aphasia or dysarthria is noted. Extraocular movements are full. Visual fields are full.  Motor: The motor testing reveals 5 over 5 strength of all 4 extremities. Good symmetric motor tone is noted throughout.  Sensory: Sensory testing is intact to pinprick, soft touch, vibration sensation, and position sense on all 4 extremities. No evidence of extinction is noted.  Coordination: Cerebellar testing reveals good finger-nose-finger and heel-to-shin bilaterally.  Gait and station: Gait is normal. Tandem gait is normal. Romberg is negative. No drift is seen.  Reflexes: Deep tendon reflexes are symmetric and normal bilaterally. Toes are downgoing bilaterally.   Assessment/Plan: 77 lady with episodic slurred speech dizziness and numbness episodes possible TIAs. Abnormal MRI scan of the brain showing nonspecific tiny subcortical white matter hyperintensities.    Transcanal Doppler bubble study to evaluate for intercardiac shunt would be appropriate.      [facility]      [address]      [place]. [place] [address] [phone]       TRANSCRANIAL DOPPLER BUBBLE STUDY   [name] Date of Birth:  [birth year] Medical Record Number:  [id number]   Indications: Diagnostic Date of Procedure: 12/04/12 Clinical History:  30 lady with slurred speech and dizziness possible TIAs Technical Description:   Transcranial Doppler Bubble Study was performed at the bedside after taking written informed consent from the patient and explaining risk/benefits. Both middle cerebral arteries were insonated using a headset. And IV line was  inserted in the right forearm by the RN using aseptic precautions. Agitated saline injection at rest and after valsalva maneuver did  result in high intensity transient signals (HITS).   Impression:  Positive  Transcranial Doppler Bubble Study indicative of right to left intracardiac shunt.   Results were explained to the patient. Questions were answered.I had a long discussion with the patient with regards to the role of patent foramen ovale   and risk of stroke. It is unclear at the present time whether PFO closure leads to better secondary stroke prevention or not. There are ongoing clinical trials which are trying to address this issue. I would recommend antiplatelet therapy for now. Followup with Dr. Anne Hahn for further instructions  Delia Heady, MD 12/04/2012 3:51 PM  [facility] [address] [place], [place] [address]  Phone [phone] Fax [phone]

## 2012-12-25 ENCOUNTER — Other Ambulatory Visit (HOSPITAL_BASED_OUTPATIENT_CLINIC_OR_DEPARTMENT_OTHER): Payer: Federal, State, Local not specified - PPO | Admitting: Lab

## 2012-12-25 ENCOUNTER — Ambulatory Visit (HOSPITAL_BASED_OUTPATIENT_CLINIC_OR_DEPARTMENT_OTHER): Payer: Federal, State, Local not specified - PPO | Admitting: Oncology

## 2012-12-25 VITALS — BP 123/84 | HR 75 | Temp 97.9°F | Resp 20 | Ht 67.0 in | Wt 185.1 lb

## 2012-12-25 DIAGNOSIS — D509 Iron deficiency anemia, unspecified: Secondary | ICD-10-CM

## 2012-12-25 DIAGNOSIS — Z86718 Personal history of other venous thrombosis and embolism: Secondary | ICD-10-CM

## 2012-12-25 DIAGNOSIS — N920 Excessive and frequent menstruation with regular cycle: Secondary | ICD-10-CM

## 2012-12-25 DIAGNOSIS — Z86711 Personal history of pulmonary embolism: Secondary | ICD-10-CM

## 2012-12-25 LAB — COMPREHENSIVE METABOLIC PANEL (CC13)
ALT: 17 U/L (ref 0–55)
Albumin: 3.9 g/dL (ref 3.5–5.0)
Alkaline Phosphatase: 78 U/L (ref 40–150)
BUN: 11.7 mg/dL (ref 7.0–26.0)
Chloride: 104 mEq/L (ref 98–109)
Glucose: 87 mg/dl (ref 70–140)
Potassium: 3.9 mEq/L (ref 3.5–5.1)
Sodium: 137 mEq/L (ref 136–145)
Total Protein: 8.2 g/dL (ref 6.4–8.3)

## 2012-12-25 LAB — CBC WITH DIFFERENTIAL/PLATELET
Eosinophils Absolute: 0.2 10*3/uL (ref 0.0–0.5)
HCT: 37.9 % (ref 34.8–46.6)
MCV: 97.3 fL (ref 79.5–101.0)
MONO#: 0.5 10*3/uL (ref 0.1–0.9)
MONO%: 7.9 % (ref 0.0–14.0)
NEUT#: 2.7 10*3/uL (ref 1.5–6.5)
RBC: 3.89 10*6/uL (ref 3.70–5.45)
RDW: 13.1 % (ref 11.2–14.5)
WBC: 5.7 10*3/uL (ref 3.9–10.3)
lymph#: 2.2 10*3/uL (ref 0.9–3.3)

## 2012-12-25 LAB — FERRITIN CHCC: Ferritin: 31 ng/ml (ref 9–269)

## 2012-12-25 LAB — IRON AND TIBC CHCC
%SAT: 18 % — ABNORMAL LOW (ref 21–57)
Iron: 61 ug/dL (ref 41–142)
UIBC: 270 ug/dL (ref 120–384)

## 2012-12-25 NOTE — Progress Notes (Signed)
Hematology and Oncology Follow Up Visit  [name] [id number] 03/16/1955 57 y.o. 12/25/2012 3:21 PM  Sheliah Mends, MD  Kristie Cowman, MD  Carrington Clamp, M.D.    Principle Diagnosis: A 56 year old female with the following issues: 1. Deep vein thrombosis diagnosed March 2010, probably provoked due to estrogen replacement.  She was treated with Coumadin and subsequently developed a pulmonary embolus while she was on Coumadin.  Her hypercoagulable workup was essentially unrevealing except for an elevated factor VIII. 2. History of iron deficiency anemia due to heavy menstrual cycles and uterine fibroids.  Past Therapy: She was anticoagulated with Lovenox 120 mg subcutaneously since March 2010 till 12/2010.  She has an IVC filter as well.  Current therapy: She is chronically on ASA 81 mg since 12/2010. She is on oral Fe supplement as well.   Interim History:   [name] presents today for a followup visit. She is a  very pleasant 57 year old female with the above history. She has continued to do well on ASA, she is not reporting any complications.  She does not report any excessive bleeding. She had not had any worsening menstrual bleeding at this point.  Overall she had not had any hospitalization and did not report any illnesses.  Had not reported any epistaxis or hematochezia. No thrombotic episodes noted.   Medications: I have reviewed the patient's current medications. Current Outpatient Prescriptions  Medication Sig Dispense Refill  . aspirin 81 MG tablet Take 81 mg by mouth daily.      . candesartan (ATACAND) 4 MG tablet Take 4 mg by mouth daily.        . IRON PO Take 65 mg by mouth 2 (two) times daily.        . lansoprazole (PREVACID) 30 MG capsule Take 30 mg by mouth as needed.        Marland Kitchen VITAMIN D, ERGOCALCIFEROL, PO Take 3,200 Units by mouth daily.       No current facility-administered medications for this visit.    Allergies:  Allergies  Allergen Reactions   . Amoxicillin-Pot Clavulanate   . Niacin-Lovastatin Er     Past Medical History, Surgical history, Social history, and Family History were reviewed and updated.  Review of Systems: Remaining ROS negative. Physical Exam: Blood pressure 123/84, pulse 75, temperature 97.9 F (36.6 C), temperature source Oral, resp. rate 20, height 5\' 7"  (1.702 m), weight 185 lb 1.6 oz (83.961 kg), last menstrual period 06/18/2010, SpO2 100.00%. ECOG: 0 General appearance: alert Head: Normocephalic, without obvious abnormality, atraumatic Neck: no adenopathy, no carotid bruit, no JVD, supple, symmetrical, trachea midline and thyroid not enlarged, symmetric, no tenderness/mass/nodules Lymph nodes: Cervical, supraclavicular, and axillary nodes normal. Heart:regular rate and rhythm, S1, S2 normal, no murmur, click, rub or gallop Lung:chest clear, no wheezing, rales, normal symmetric air entry, Heart exam - S1, S2 normal, no murmur, no gallop, rate regular Abdomin: soft, non-tender, without masses or organomegaly EXT:no erythema, induration, or nodules   Lab Results: Lab Results  Component Value Date   WBC 5.7 12/25/2012   HGB 12.4 12/25/2012   HCT 37.9 12/25/2012   MCV 97.3 12/25/2012   PLT 177 12/25/2012     Chemistry      Component Value Date/Time   NA 139 06/24/2012 1548   NA 139 06/27/2011 1523   K 4.0 06/24/2012 1548   K 4.1 06/27/2011 1523   CL 105 06/24/2012 1548   CL 106 06/27/2011 1523   CO2 26 06/24/2012 1548   CO2  25 06/27/2011 1523   BUN 10.9 06/24/2012 1548   BUN 13 06/27/2011 1523   CREATININE 0.9 06/24/2012 1548   CREATININE 0.85 06/27/2011 1523      Component Value Date/Time   CALCIUM 9.0 06/24/2012 1548   CALCIUM 8.9 06/27/2011 1523   ALKPHOS 86 06/24/2012 1548   ALKPHOS 75 06/27/2011 1523   AST 11 06/24/2012 1548   AST 12 06/27/2011 1523   ALT 8 06/24/2012 1548   ALT 11 06/27/2011 1523   BILITOT 0.24 06/24/2012 1548   BILITOT 0.3 06/27/2011 1523      Impression and Plan:  This is  a 57 year old female with the following issues:  1. Lower extremity deep venous thrombosis followed by a pulmonary embolus. She has been on low dose aspirin 81 mg daily since 12/2010 without complications. She is to let me know if she develops signs or symptoms of thrombosis. 2. Menorrhagia.  Seems to be under good control.  Her hemoglobin is excellent.  No evidence of any iron-deficiency anemia.  I am rechecking her iron stores today.  Followup will be in 6 months.      [facility], MD 11/14/20143:21 PM

## 2012-12-28 ENCOUNTER — Telehealth: Payer: Self-pay | Admitting: Oncology

## 2013-03-04 ENCOUNTER — Ambulatory Visit
Admission: RE | Admit: 2013-03-04 | Discharge: 2013-03-04 | Disposition: A | Discharge status: Home / Self Care | Payer: Federal, State, Local not specified - PPO | Source: Ambulatory Visit | Attending: Obstetrics and Gynecology | Admitting: Obstetrics and Gynecology

## 2013-03-04 DIAGNOSIS — R921 Mammographic calcification found on diagnostic imaging of breast: Secondary | ICD-10-CM

## 2013-07-02 ENCOUNTER — Ambulatory Visit: Payer: Federal, State, Local not specified - PPO | Admitting: Oncology

## 2013-07-02 ENCOUNTER — Other Ambulatory Visit: Payer: Federal, State, Local not specified - PPO

## 2013-07-27 ENCOUNTER — Other Ambulatory Visit: Payer: Self-pay | Admitting: Obstetrics and Gynecology

## 2013-07-27 DIAGNOSIS — R921 Mammographic calcification found on diagnostic imaging of breast: Secondary | ICD-10-CM

## 2013-07-27 DIAGNOSIS — N63 Unspecified lump in unspecified breast: Secondary | ICD-10-CM

## 2013-08-06 ENCOUNTER — Ambulatory Visit
Admission: RE | Admit: 2013-08-06 | Discharge: 2013-08-06 | Disposition: A | Discharge status: Home / Self Care | Payer: Federal, State, Local not specified - PPO | Source: Ambulatory Visit | Attending: Obstetrics and Gynecology | Admitting: Obstetrics and Gynecology

## 2013-08-06 DIAGNOSIS — R921 Mammographic calcification found on diagnostic imaging of breast: Secondary | ICD-10-CM

## 2013-09-24 ENCOUNTER — Ambulatory Visit (INDEPENDENT_AMBULATORY_CARE_PROVIDER_SITE_OTHER): Payer: Federal, State, Local not specified - PPO | Admitting: Cardiology

## 2013-09-24 ENCOUNTER — Encounter: Payer: Self-pay | Admitting: Cardiology

## 2013-09-24 VITALS — BP 124/88 | HR 66 | Ht 67.0 in | Wt 181.0 lb

## 2013-09-24 DIAGNOSIS — R002 Palpitations: Secondary | ICD-10-CM | POA: Insufficient documentation

## 2013-09-24 HISTORY — DX: Palpitations: R00.2

## 2013-09-24 NOTE — Patient Instructions (Signed)
Your physician recommends that you schedule a follow-up appointment in: as needed  We are ordering a 48 hr monitor

## 2013-09-24 NOTE — Progress Notes (Signed)
HPI The patient presents for evaluation of palpitations. She has had palpitations in the past and reports a treadmill test years ago in [place]. She presented to an urgent care because she noticed palpitations that were more noticeable at night. Her heart was beating strong. She was not describing necessarily skipping beats. She was not having any presyncope or syncope. She was not having any chest pressure, neck or arm discomfort. She doesn't notice his palpitations at night. She has had no new shortness of breath, PND or orthopnea. She's had no weight gain or edema. 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 She has lab work drawn reports that her TSH was normal. I don't have these results as they were done at the end of July when she was seen. I did review the outside records.  Allergies  Allergen Reactions  . Amoxicillin-Pot Clavulanate   . Niacin-Lovastatin Er     Current Outpatient Prescriptions  Medication Sig Dispense Refill  . aspirin 81 MG tablet Take 81 mg by mouth daily.      . candesartan (ATACAND) 4 MG tablet Take 4 mg by mouth daily.        . IRON PO Take 65 mg by mouth as needed.       Marland Kitchen NIACIN CR PO Take 1 tablet by mouth daily.      . Omega-3 Fatty Acids (FISH OIL PO) Take 1,500 mg by mouth 2 (two) times daily.      . pravastatin (PRAVACHOL) 20 MG tablet Take 20 mg by mouth daily.      Marland Kitchen VITAMIN D, ERGOCALCIFEROL, PO Take 4,000 Units by mouth every other day.        No current facility-administered medications for this visit.    Past Medical History  Diagnosis Date  . Hypertension   . Hyperlipemia   . Uterine fibroid   . DVT (deep venous thrombosis)     04/2010  . Iron deficiency anemia   . Headache(784.0) 09/22/2012  . Pulmonary embolism   . Vitamin D deficiency     Past Surgical History  Procedure Laterality Date  . Dilation and curettage of uterus      01/2008  . Cholecystectomy    . Ivc filter      Family History  Problem Relation  Age of Onset  . Drug abuse Brother   . Hypertension Mother     History   Social History  . Marital Status: Single    Spouse Name: N/A    Number of Children: N/A  . Years of Education: college   Occupational History  . TAX SPECIALIST    Social History Main Topics  . Smoking status: Former Smoker -- 0.40 packs/day for 10 years    Types: Cigarettes    Quit date: 06/01/2002  . Smokeless tobacco: Never Used  . Alcohol Use: Yes     Comment: occasional 1 drink per month  . Drug Use: No  . Sexual Activity: Not on file   Other Topics Concern  . Not on file   Social History Narrative   Lives alone.     ROS:  Positive for dizziness, reflux and cramps.  Otherwise as stated in the HPI and negative for all other systems.  PHYSICAL EXAM BP 124/88  Pulse 66  Ht 5\' 7"  (1.702 m)  Wt 181 lb (82.101 kg)  BMI 28.34 kg/m2  LMP 06/18/2010 GENERAL:  Well appearing HEENT:  Pupils equal round and reactive, fundi not visualized,  oral mucosa unremarkable NECK:  No jugular venous distention, waveform within normal limits, carotid upstroke brisk and symmetric, no bruits, no thyromegaly LYMPHATICS:  No cervical, inguinal adenopathy LUNGS:  Clear to auscultation bilaterally BACK:  No CVA tenderness CHEST:  Unremarkable HEART:  PMI not displaced or sustained,S1 and S2 within normal limits, no S3, no S4, no clicks, no rubs, no murmurs ABD:  Flat, positive bowel sounds normal in frequency in pitch, no bruits, no rebound, no guarding, no midline pulsatile mass, no hepatomegaly, no splenomegaly EXT:  2 plus pulses throughout, no edema, no cyanosis no clubbing SKIN:  No rashes no nodules NEURO:  Cranial nerves II through XII grossly intact, motor grossly intact throughout [facility]:  Cognitively intact, oriented to person place and time The wound was  EKG:  Sinus rhythm, rate 66, axis within normal limits, intervals within normal limits, early transition V2.  09/24/2013  ASSESSMENT AND  PLAN  PALPITATIONS:  We discussed these and I suspect that some of this might be related to stress although I could not exclude a dysrhythmia without monitoring. I will place a 48 hour Holter. She might consider stopping all caffeine which she uses sparingly but she does have some chocolate at home at times. She and I will then discuss whether she wants therapy based on the results of the monitor.  DYSLIPIDEMIA:  Her LDL most recently was less than 100.  No change in therapy is indicated.  HTN:  The blood pressure is at target. No change in medications is indicated. We will continue with therapeutic lifestyle changes (TLC).

## 2013-09-30 ENCOUNTER — Telehealth: Payer: Self-pay | Admitting: Cardiology

## 2013-09-30 NOTE — Telephone Encounter (Signed)
lmsg today and august 18 for pt to call.  She needs 48 hour monitor.

## 2013-10-29 ENCOUNTER — Telehealth: Payer: Self-pay | Admitting: Cardiology

## 2013-10-29 NOTE — Telephone Encounter (Signed)
Patient had concern regarding a copayment that she made for her visit on 09/24/13 with Dr. Percival Spanish.  She states that she paid with a credit card in the amount of $30.00.  I pulled the financial summary for that date, checked all of the credit card receipts for amount and name-double checked the dollar amount of the receipts to the batch total for that date and everything balanced.  I asked the patient is she had her receipt and she stated that she had it somewhere and she guessed she would have to call her credit card company.  I told her if she found the receipt we would need to see it and then we could help her.  She voiced her understanding.

## 2014-04-01 ENCOUNTER — Other Ambulatory Visit: Payer: Self-pay

## 2014-04-04 LAB — CYTOLOGY - PAP

## 2014-04-14 ENCOUNTER — Other Ambulatory Visit: Payer: Self-pay | Admitting: Obstetrics

## 2014-04-14 DIAGNOSIS — N63 Unspecified lump in unspecified breast: Secondary | ICD-10-CM

## 2014-04-21 ENCOUNTER — Ambulatory Visit
Admission: RE | Admit: 2014-04-21 | Discharge: 2014-04-21 | Disposition: A | Discharge status: Home / Self Care | Payer: Federal, State, Local not specified - PPO | Source: Ambulatory Visit | Attending: Obstetrics | Admitting: Obstetrics

## 2014-04-21 DIAGNOSIS — N63 Unspecified lump in unspecified breast: Secondary | ICD-10-CM

## 2014-08-22 ENCOUNTER — Other Ambulatory Visit (HOSPITAL_COMMUNITY): Payer: Self-pay | Admitting: Physician Assistant

## 2014-08-22 DIAGNOSIS — E0590 Thyrotoxicosis, unspecified without thyrotoxic crisis or storm: Secondary | ICD-10-CM

## 2014-08-25 ENCOUNTER — Other Ambulatory Visit: Payer: Self-pay

## 2014-08-25 DIAGNOSIS — Z1231 Encounter for screening mammogram for malignant neoplasm of breast: Secondary | ICD-10-CM

## 2014-08-31 ENCOUNTER — Encounter (HOSPITAL_COMMUNITY)
Admission: RE | Admit: 2014-08-31 | Discharge: 2014-08-31 | Disposition: A | Discharge status: Home / Self Care | Payer: Federal, State, Local not specified - PPO | Source: Ambulatory Visit | Attending: Physician Assistant | Admitting: Physician Assistant

## 2014-08-31 DIAGNOSIS — E0590 Thyrotoxicosis, unspecified without thyrotoxic crisis or storm: Secondary | ICD-10-CM | POA: Insufficient documentation

## 2014-08-31 MED ORDER — SODIUM IODIDE I 131 CAPSULE
6.7000 | Freq: Once | INTRAVENOUS | Status: AC | PRN
  Administered 2014-08-31: 6.7 via ORAL

## 2014-09-01 ENCOUNTER — Encounter (HOSPITAL_COMMUNITY)
Admission: RE | Admit: 2014-09-01 | Discharge: 2014-09-01 | Disposition: A | Discharge status: Home / Self Care | Payer: Federal, State, Local not specified - PPO | Source: Ambulatory Visit | Attending: Physician Assistant | Admitting: Physician Assistant

## 2014-09-01 DIAGNOSIS — E0590 Thyrotoxicosis, unspecified without thyrotoxic crisis or storm: Secondary | ICD-10-CM | POA: Diagnosis not present

## 2014-09-01 MED ORDER — SODIUM PERTECHNETATE TC 99M INJECTION
10.0000 | Freq: Once | INTRAVENOUS | Status: AC | PRN
  Administered 2014-09-01: 10 via INTRAVENOUS

## 2014-09-03 DIAGNOSIS — E0590 Thyrotoxicosis, unspecified without thyrotoxic crisis or storm: Secondary | ICD-10-CM | POA: Insufficient documentation

## 2014-09-03 DIAGNOSIS — E0500 Thyrotoxicosis with diffuse goiter without thyrotoxic crisis or storm: Secondary | ICD-10-CM

## 2014-09-03 HISTORY — DX: Thyrotoxicosis with diffuse goiter without thyrotoxic crisis or storm: E05.00

## 2014-09-29 ENCOUNTER — Ambulatory Visit: Payer: Federal, State, Local not specified - PPO

## 2014-09-29 ENCOUNTER — Ambulatory Visit
Admission: RE | Admit: 2014-09-29 | Discharge: 2014-09-29 | Disposition: A | Discharge status: Home / Self Care | Payer: Federal, State, Local not specified - PPO | Source: Ambulatory Visit

## 2014-09-29 DIAGNOSIS — Z1231 Encounter for screening mammogram for malignant neoplasm of breast: Secondary | ICD-10-CM

## 2014-10-04 DIAGNOSIS — F419 Anxiety disorder, unspecified: Secondary | ICD-10-CM | POA: Insufficient documentation

## 2014-10-19 ENCOUNTER — Ambulatory Visit: Payer: Federal, State, Local not specified - PPO | Admitting: Internal Medicine

## 2014-10-19 ENCOUNTER — Encounter: Payer: Self-pay | Admitting: Internal Medicine

## 2014-10-19 VITALS — BP 118/60 | HR 100 | Temp 98.3°F | Resp 12 | Ht 67.0 in | Wt 171.0 lb

## 2014-10-19 DIAGNOSIS — E0500 Thyrotoxicosis with diffuse goiter without thyrotoxic crisis or storm: Secondary | ICD-10-CM | POA: Insufficient documentation

## 2014-10-19 NOTE — Progress Notes (Signed)
Patient ID: [name], female   DOB: 24-Nov-1955, 59 y.o.   MRN: [id number]   HPI  [name] is a 59 y.o.-year-old female, referred by her PCP, Dr. Jenny Reichmann, for management of Graves ds.  She started to feel: fatigue, weakness, insomnia, vertigo, palpitations, nausea >> saw PCP >> TFTs checked.  Thyroid Uptake and Scan (08/13/2014): 57.5% (10-30%), uniform, c/w Graves ds.  I reviewed pt's thyroid tests: Lab Results  Component Value Date   TSH 1.04 04/18/2008   Patient appears very upset today that she had to wait 1.5 mo before she could see me. I apologize for this, but I also explained that this is not something that I could control.   I also explained that I do not have recent TFTs in the system, the last being in 2010. She tells me that her thyroid labs were checked by Dr. Jenny Reichmann and should be in the system. I asked her whether she had them done outside of the system, but she denies. I advised her that we can get thyroid results from Dr. Jenny Reichmann, but she again became very upset  I advised her that we will need to repeat the thyroid labs today, since it's been at least 2 months since the previous set. At this point, patient became very upset and started crying because I do not have previous labs and I need to recheck her tests today, which she would not want to do.  She would like to change providers, which I understand and I agree. I directed her to one of my colleagues.

## 2014-10-24 ENCOUNTER — Encounter: Payer: Federal, State, Local not specified - PPO | Admitting: Neurology

## 2014-11-10 ENCOUNTER — Ambulatory Visit: Payer: Federal, State, Local not specified - PPO | Admitting: Endocrinology

## 2015-04-06 ENCOUNTER — Encounter: Payer: Self-pay | Admitting: Gastroenterology

## 2015-05-03 ENCOUNTER — Telehealth: Payer: Self-pay | Admitting: Cardiology

## 2015-05-03 NOTE — Telephone Encounter (Signed)
Pt called in stating that she would like to have a test to check the blood clot in her leg. I informed her she would need to come in and see the doctor first before getting scheduled for a test. She does not feel that needs to take place. Please assist. She also insist on see Dr. Debara Pickett, there is not record of that in the system.   Thanks

## 2015-05-03 NOTE — Telephone Encounter (Signed)
Attempt x2 - unable to connect to number provided.

## 2015-05-23 ENCOUNTER — Encounter: Payer: Self-pay | Admitting: Gastroenterology

## 2015-05-23 ENCOUNTER — Ambulatory Visit (INDEPENDENT_AMBULATORY_CARE_PROVIDER_SITE_OTHER): Payer: Federal, State, Local not specified - PPO | Admitting: Gastroenterology

## 2015-05-23 VITALS — BP 126/78 | HR 72 | Ht 67.0 in | Wt 178.0 lb

## 2015-05-23 DIAGNOSIS — R7989 Other specified abnormal findings of blood chemistry: Secondary | ICD-10-CM

## 2015-05-23 DIAGNOSIS — R945 Abnormal results of liver function studies: Principal | ICD-10-CM

## 2015-05-23 NOTE — Progress Notes (Signed)
[facility] Consult Note:  History: [name] 05/23/2015  Referring physician: Cathlean Cower, MD  Reason for consult/chief complaint: Elevated Alkaline Phosphatase   Subjective HPI:  This delightful woman was referred by her primary care provider for an elevated alkaline phosphatase. We only have 1 set of labs from about 2 months ago, she says they were elevated on a subsequent lab draw last month. She also says she went for recent liver ultrasound that was normal (no report sent to [place]). She feels well other than some fatigue ever since her diagnosis of hyperthyroidism last fall, and some various arthralgias, especially her right hip. She denies a personal or family history of liver disease. She reports being up-to-date on colonoscopy, having had one in the last 5-6 years with Dr. Collene Mares.   ROS:  Review of Systems  Constitutional: Positive for fatigue. Negative for appetite change and unexpected weight change.  HENT: Negative for mouth sores and voice change.   Eyes: Negative for pain and redness.  Respiratory: Negative for cough and shortness of breath.   Cardiovascular: Negative for chest pain and palpitations.  Genitourinary: Negative for dysuria and hematuria.  Musculoskeletal: Positive for arthralgias. Negative for myalgias.  Skin: Negative for pallor and rash.  Neurological: Negative for weakness and headaches.  Hematological: Negative for adenopathy.     Past Medical History: Past Medical History  Diagnosis Date  . Hypertension   . Hyperlipemia   . Uterine fibroid   . DVT (deep venous thrombosis) ([place])     04/2010  . Iron deficiency anemia   . Headache(784.0) 09/22/2012  . Pulmonary embolism ([place])   . Vitamin D deficiency     Graves disease  . Hyperlipidemia      Past Surgical History: Past Surgical History  Procedure Laterality Date  . Dilation and curettage of uterus      01/2008  . Cholecystectomy    . Ivc filter       Family  History: Family History  Problem Relation Age of Onset  . Drug abuse Brother   . Hypertension Mother     Social History: Social History   Social History  . Marital Status: Single    Spouse Name: N/A  . Number of Children: N/A  . Years of Education: college   Occupational History  . [employer] SPECIALIST    Social History Main Topics  . Smoking status: Former Smoker -- 0.40 packs/day for 10 years    Types: Cigarettes    Quit date: 06/01/2002  . Smokeless tobacco: Never Used  . Alcohol Use: Yes     Comment: occasional 1 drink per month  . Drug Use: No  . Sexual Activity: Not Asked   Other Topics Concern  . None   Social History Narrative   Lives alone.     Allergies: Allergies  Allergen Reactions  . Amoxicillin-Pot Clavulanate   . Niacin-Lovastatin Er     Outpatient Meds: Current Outpatient Prescriptions  Medication Sig Dispense Refill  . aspirin 81 MG tablet Take 81 mg by mouth daily.    . candesartan (ATACAND) 4 MG tablet Take 4 mg by mouth daily.      Marland Kitchen LORazepam (ATIVAN) 0.5 MG tablet Take 0.5 mg by mouth 3 (three) times daily as needed.  0  . methimazole (TAPAZOLE) 10 MG tablet Take 10 mg by mouth daily.    . pravastatin (PRAVACHOL) 20 MG tablet Take 20 mg by mouth daily.    Marland Kitchen VITAMIN D, ERGOCALCIFEROL, PO Take 4,000 Units by  mouth every other day.      No current facility-administered medications for this visit.      ___________________________________________________________________ Objective  Exam:  BP 126/78 mmHg  Pulse 72  Ht 5\' 7"  (1.702 m)  Wt 178 lb (80.74 kg)  BMI 27.87 kg/m2   General: this is a(n) Well-appearing middle-aged African-American woman good muscle mass   Eyes: sclera anicteric, no redness  ENT: oral mucosa moist without lesions, no cervical or supraclavicular lymphadenopathy, good dentition  CV: RRR without murmur, S1/S2, no JVD, no peripheral edema  Resp: clear to auscultation bilaterally, normal RR and effort  noted  GI: soft, no tenderness, with active bowel sounds. Liver edge is felt on deep inspiration, nontender  Skin; warm and dry, no rash or jaundice noted  Neuro: awake, alert and oriented x 3. Normal gross motor function and fluent speech  Labs:  Alkaline phosphatase 221, normal total bilirubin AST, ALT. TG T normal   Radiologic Studies:  Recent normal hepatic ultrasound, by the patient's report. It was done outside the [facility] system, I cannot pull up the report.  Assessment: Encounter Diagnosis  Name Primary?  Marland Kitchen LFT elevation Yes    Given the isolated elevated alkaline phosphatase and normal GGT, this appears not to be a hepatic process. She probably has some bony disease such as Paget's or less likely from osteoporosis.  Plan:  She is referred back to primary care for further workup.  Thank you for the courtesy of this consult.  Please call me with any questions or concerns.  Nelida Meuse III

## 2015-05-23 NOTE — Patient Instructions (Signed)
If you are age 58 or older, your body mass index should be between 23-30. Your Body mass index is 27.87 kg/(m^2). If this is out of the aforementioned range listed, please consider follow up with your Primary Care Provider.  If you are age 17 or younger, your body mass index should be between 19-25. Your Body mass index is 27.87 kg/(m^2). If this is out of the aformentioned range listed, please consider follow up with your Primary Care Provider.    Thank you for choosing [facility]  Dr Wilfrid Lund III

## 2015-07-12 ENCOUNTER — Other Ambulatory Visit: Payer: Self-pay | Admitting: Obstetrics and Gynecology

## 2015-07-13 LAB — CYTOLOGY - PAP

## 2015-07-14 ENCOUNTER — Other Ambulatory Visit: Payer: Self-pay | Admitting: Obstetrics and Gynecology

## 2015-07-14 DIAGNOSIS — E2839 Other primary ovarian failure: Secondary | ICD-10-CM

## 2015-07-14 DIAGNOSIS — Z1231 Encounter for screening mammogram for malignant neoplasm of breast: Secondary | ICD-10-CM

## 2015-07-21 ENCOUNTER — Ambulatory Visit
Admission: RE | Admit: 2015-07-21 | Discharge: 2015-07-21 | Disposition: A | Discharge status: Home / Self Care | Payer: Federal, State, Local not specified - PPO | Source: Ambulatory Visit | Attending: Obstetrics and Gynecology | Admitting: Obstetrics and Gynecology

## 2015-07-21 DIAGNOSIS — E2839 Other primary ovarian failure: Secondary | ICD-10-CM

## 2015-08-31 ENCOUNTER — Other Ambulatory Visit: Payer: Self-pay | Admitting: Physician Assistant

## 2015-08-31 DIAGNOSIS — Z1231 Encounter for screening mammogram for malignant neoplasm of breast: Secondary | ICD-10-CM

## 2015-10-02 ENCOUNTER — Ambulatory Visit
Admission: RE | Admit: 2015-10-02 | Discharge: 2015-10-02 | Disposition: A | Discharge status: Home / Self Care | Payer: Federal, State, Local not specified - PPO | Source: Ambulatory Visit | Attending: Physician Assistant | Admitting: Physician Assistant

## 2015-10-02 DIAGNOSIS — Z1231 Encounter for screening mammogram for malignant neoplasm of breast: Secondary | ICD-10-CM | POA: Diagnosis not present

## 2015-10-04 ENCOUNTER — Ambulatory Visit
Admission: RE | Admit: 2015-10-04 | Discharge: 2015-10-04 | Disposition: A | Discharge status: Home / Self Care | Payer: Federal, State, Local not specified - PPO | Source: Ambulatory Visit | Attending: Obstetrics and Gynecology | Admitting: Obstetrics and Gynecology

## 2015-10-04 DIAGNOSIS — Z780 Asymptomatic menopausal state: Secondary | ICD-10-CM | POA: Diagnosis not present

## 2015-10-04 DIAGNOSIS — Z13820 Encounter for screening for osteoporosis: Secondary | ICD-10-CM | POA: Diagnosis not present

## 2015-10-06 DIAGNOSIS — K080 Exfoliation of teeth due to systemic causes: Secondary | ICD-10-CM | POA: Diagnosis not present

## 2015-10-09 DIAGNOSIS — E0500 Thyrotoxicosis with diffuse goiter without thyrotoxic crisis or storm: Secondary | ICD-10-CM | POA: Diagnosis not present

## 2015-10-09 DIAGNOSIS — I10 Essential (primary) hypertension: Secondary | ICD-10-CM | POA: Diagnosis not present

## 2015-10-09 DIAGNOSIS — R748 Abnormal levels of other serum enzymes: Secondary | ICD-10-CM | POA: Diagnosis not present

## 2015-10-11 DIAGNOSIS — I10 Essential (primary) hypertension: Secondary | ICD-10-CM | POA: Diagnosis not present

## 2015-10-11 DIAGNOSIS — E039 Hypothyroidism, unspecified: Secondary | ICD-10-CM | POA: Diagnosis not present

## 2015-10-11 DIAGNOSIS — E0500 Thyrotoxicosis with diffuse goiter without thyrotoxic crisis or storm: Secondary | ICD-10-CM | POA: Diagnosis not present

## 2015-10-25 ENCOUNTER — Ambulatory Visit (HOSPITAL_COMMUNITY)
Admission: RE | Admit: 2015-10-25 | Discharge: 2015-10-25 | Disposition: A | Discharge status: Home / Self Care | Payer: Federal, State, Local not specified - PPO | Source: Ambulatory Visit | Attending: Cardiovascular Disease | Admitting: Cardiovascular Disease

## 2015-10-25 ENCOUNTER — Other Ambulatory Visit (HOSPITAL_COMMUNITY): Payer: Self-pay | Admitting: Physician Assistant

## 2015-10-25 DIAGNOSIS — M79606 Pain in leg, unspecified: Secondary | ICD-10-CM

## 2015-10-25 DIAGNOSIS — M7989 Other specified soft tissue disorders: Secondary | ICD-10-CM | POA: Diagnosis not present

## 2015-10-25 DIAGNOSIS — M79605 Pain in left leg: Secondary | ICD-10-CM | POA: Diagnosis not present

## 2015-10-25 DIAGNOSIS — Z86718 Personal history of other venous thrombosis and embolism: Secondary | ICD-10-CM | POA: Diagnosis not present

## 2015-10-29 DIAGNOSIS — R748 Abnormal levels of other serum enzymes: Secondary | ICD-10-CM | POA: Insufficient documentation

## 2015-11-09 DIAGNOSIS — S161XXA Strain of muscle, fascia and tendon at neck level, initial encounter: Secondary | ICD-10-CM | POA: Diagnosis not present

## 2015-11-16 DIAGNOSIS — K080 Exfoliation of teeth due to systemic causes: Secondary | ICD-10-CM | POA: Diagnosis not present

## 2015-11-30 ENCOUNTER — Encounter: Payer: Self-pay | Admitting: Cardiology

## 2015-12-06 ENCOUNTER — Other Ambulatory Visit (HOSPITAL_COMMUNITY): Payer: Self-pay | Admitting: Physician Assistant

## 2015-12-06 DIAGNOSIS — R748 Abnormal levels of other serum enzymes: Secondary | ICD-10-CM

## 2015-12-13 ENCOUNTER — Encounter: Payer: Self-pay | Admitting: Vascular Surgery

## 2015-12-14 ENCOUNTER — Ambulatory Visit (INDEPENDENT_AMBULATORY_CARE_PROVIDER_SITE_OTHER): Payer: Federal, State, Local not specified - PPO | Admitting: Vascular Surgery

## 2015-12-14 ENCOUNTER — Encounter: Payer: Self-pay | Admitting: Vascular Surgery

## 2015-12-14 ENCOUNTER — Ambulatory Visit: Payer: Federal, State, Local not specified - PPO | Admitting: Cardiology

## 2015-12-14 VITALS — BP 125/89 | HR 69 | Temp 98.2°F | Resp 16 | Ht 66.0 in | Wt 184.4 lb

## 2015-12-14 DIAGNOSIS — Z95828 Presence of other vascular implants and grafts: Secondary | ICD-10-CM

## 2015-12-14 NOTE — Progress Notes (Signed)
Patient name: [name] MRN: [id number] DOB: December 03, 1955 Sex: female  REASON FOR CONSULT: To evaluate IVC filter.  HPI: [name] is a 60 y.o. female, who developed a right lower extremity DVT in 2010. She was on Coumadin but despite this had a pulmonary embolus. This prompted placement of an IVC filter by interventional radiology on 05/09/2008. She had some concerns about her filter and therefore a CT scan was obtained on 08/01/2015. The CT scan showed that the filter appeared to be in good position although 2 struts appear to a penetrated the inferior vena cava. There was an anterior strut with the tip lying between 2 loops of bowel and also a posterior protruding strut that was adjacent to a vertebral body. She was sent for vascular consultation.  She denies any significant abdominal pain or back pain. She denies any nausea vomiting or change in her bowel habits. Since her filter was placed she has not been involved in any trauma or had any abdominal surgery.  She did have some concerns in September of this year after traveling that she might have a clot in her left leg but underwent a duplex which showed no evidence of DVT in the left lower extremity.  Past Medical History:  Diagnosis Date  . DVT (deep venous thrombosis) ([place])    04/2010  . Headache(784.0) 09/22/2012  . Hyperlipemia   . Hyperlipidemia   . Hypertension   . Iron deficiency anemia   . Pulmonary embolism ([place])   . Uterine fibroid   . Vitamin D deficiency    Graves disease    Family History  Problem Relation Age of Onset  . Drug abuse Brother   . Hypertension Mother     SOCIAL HISTORY: Social History   Social History  . Marital status: Single    Spouse name: N/A  . Number of children: N/A  . Years of education: college   Occupational History  . TAX SPECIALIST [employer]   Social History Main Topics  . Smoking status: Former Smoker    Packs/day: 0.40    Years: 10.00    Types: Cigarettes   Quit date: 06/01/2002  . Smokeless tobacco: Never Used  . Alcohol use Yes     Comment: occasional 1 drink per month  . Drug use: No  . Sexual activity: Not on file   Other Topics Concern  . Not on file   Social History Narrative   Lives alone.     Allergies  Allergen Reactions  . Amoxicillin-Pot Clavulanate   . Niacin-Lovastatin Er     Current Outpatient Prescriptions  Medication Sig Dispense Refill  . aspirin 81 MG tablet Take 81 mg by mouth daily.    . candesartan (ATACAND) 4 MG tablet Take 4 mg by mouth daily.      Marland Kitchen LORazepam (ATIVAN) 0.5 MG tablet Take 0.5 mg by mouth 3 (three) times daily as needed.  0  . methimazole (TAPAZOLE) 10 MG tablet Take 10 mg by mouth daily.    . pravastatin (PRAVACHOL) 20 MG tablet Take 20 mg by mouth daily.    Marland Kitchen VITAMIN D, ERGOCALCIFEROL, PO Take 4,000 Units by mouth every other day.      No current facility-administered medications for this visit.     REVIEW OF SYSTEMS:  [X]  denotes positive finding, [ ]  denotes negative finding Cardiac  Comments:  Chest pain or chest pressure:    Shortness of breath upon exertion:    Short of breath when lying  flat:    Irregular heart rhythm:        Vascular    Pain in calf, thigh, or hip brought on by ambulation:    Pain in feet at night that wakes you up from your sleep:     Blood clot in your veins:    Leg swelling:         Pulmonary    Oxygen at home:    Productive cough:     Wheezing:         Neurologic    Sudden weakness in arms or legs:     Sudden numbness in arms or legs:     Sudden onset of difficulty speaking or slurred speech:    Temporary loss of vision in one eye:     Problems with dizziness:         Gastrointestinal    Blood in stool:     Vomited blood:         Genitourinary    Burning when urinating:     Blood in urine:        Psychiatric    Major depression:         Hematologic    Bleeding problems:    Problems with blood clotting too easily:        Skin      Rashes or ulcers:        Constitutional    Fever or chills:      PHYSICAL EXAM: Vitals:   12/14/15 1229  BP: 125/89  Pulse: 69  Resp: 16  Temp: 98.2 F (36.8 C)  TempSrc: Oral  SpO2: 100%  Weight: 184 lb 6.4 oz (83.6 kg)  Height: 5\' 6"  (1.676 m)    GENERAL: The patient is a well-nourished female, in no acute distress. The vital signs are documented above. CARDIAC: There is a regular rate and rhythm.  VASCULAR: I do not detect carotid bruits. She has palpable femoral, popliteal, and pedal pulses bilaterally. She has no significant lower extremity swelling. PULMONARY: There is good air exchange bilaterally without wheezing or rales. ABDOMEN: Soft and non-tender with normal pitched bowel sounds.  MUSCULOSKELETAL: There are no major deformities or cyanosis. NEUROLOGIC: No focal weakness or paresthesias are detected. SKIN: There are no ulcers or rashes noted. PSYCHIATRIC: The patient has a normal affect.  DATA:   CT ABDOMEN: I have reviewed the images of her CT of the abdomen that was performed in June. The 2 struts of concern are noted. One strut anteriorly appears to be adjacent to the bowel. There is a posterior strut which appears to be adjacent to the vertebral body with no surrounding inflammation or hematoma.  MEDICAL ISSUES:  STATUS POST IVC FILTER: This patient had an IVC filter placed in 2010. There are 2 struts that upper tubing through the cava but they do not appear to be causing any significant problems and she is asymptomatic. Given that the filter was placed 7 years ago removal of the filter would be associated with significant risk. It is not clear how long the struts of been protruding. This issue may have been present for several years. As she is asymptomatic I would not recommend removing the filter. I have recommended a CT of the abdomen in one year's of the week and follow this. I think her main risk is the anterior strut which is adjacent to the bowel which  could potentially cause perforation or obstruction however there is no surrounding inflammation or hematoma to suggest  injury to the bowel and again this may have been present for some time and she is asymptomatic. If she develops new symptoms and certainly we would have to reevaluate her.   Deitra Mayo [facility] [phone]

## 2015-12-25 ENCOUNTER — Encounter (HOSPITAL_COMMUNITY): Payer: Federal, State, Local not specified - PPO

## 2016-01-18 NOTE — Progress Notes (Signed)
Cardiology Office Note   Date:  01/19/2016   ID:  Carmine Eshleman, DOB Oct 02, 1955, MRN [id number]  PCP:  Ledell Noss  Cardiologist:   Minus Breeding, MD    Chief Complaint  Patient presents with  . Palpitations     History of Present Illness: [name] is a 60 y.o. female who presents for follow up of palpitations.  I saw her last in 2015.   She has had palpitations in the past and reports a treadmill test years ago in [place].   She has a history of DVT/PE and IVC filter several years ago.  I saw her for evaluation of the palpitations.  Since I last saw her she has had treatment for Grave's disease.  Her palpitations are possibly less frequent but still occur.  She has a more at night. She's not had presyncope or syncope. She's had no chest pressure, neck or arm discomfort. He's had no PND or orthopnea. Of note she mentions to me when I told her apparently had a PFO diagnosed in the past.  Past Medical History:  Diagnosis Date  . DVT (deep venous thrombosis) ([place])    04/2010  . Headache(784.0) 09/22/2012  . Hyperlipemia   . Hyperlipidemia   . Hypertension   . Iron deficiency anemia   . Pulmonary embolism ([place])   . Uterine fibroid   . Vitamin D deficiency    Graves disease    Past Surgical History:  Procedure Laterality Date  . CHOLECYSTECTOMY    . DILATION AND CURETTAGE OF UTERUS     01/2008  . ivc filter       Current Outpatient Prescriptions  Medication Sig Dispense Refill  . Ascorbic Acid (VITAMIN C) 1000 MG tablet Take 1,000 mg by mouth daily.    Marland Kitchen aspirin 81 MG tablet Take 81 mg by mouth daily.    Marland Kitchen atorvastatin (LIPITOR) 20 MG tablet Take 20 mg by mouth daily.    . candesartan (ATACAND) 4 MG tablet Take 4 mg by mouth daily.      Marland Kitchen LORazepam (ATIVAN) 0.5 MG tablet Take 0.5 mg by mouth 3 (three) times daily as needed.  0  . methimazole (TAPAZOLE) 10 MG tablet Take 10 mg by mouth daily.    . metoprolol succinate (TOPROL-XL) 25 MG 24 hr tablet Take  25 mg by mouth daily as needed.    . pravastatin (PRAVACHOL) 20 MG tablet Take 20 mg by mouth daily.    Marland Kitchen VITAMIN D, ERGOCALCIFEROL, PO Take 4,000 Units by mouth every other day.      No current facility-administered medications for this visit.     Allergies:   Amoxicillin-pot clavulanate and Niacin-lovastatin er    ROS:  Please see the history of present illness.   Otherwise, review of systems are positive for occasional pain in her right knee.   All other systems are reviewed and negative.    PHYSICAL EXAM: VS:  BP 120/84   Pulse 78   Ht 5\' 6"  (1.676 m)   Wt 184 lb (83.5 kg)   BMI 29.70 kg/m  , BMI Body mass index is 29.7 kg/m. GENERAL:  Well appearing NECK:  No jugular venous distention, waveform within normal limits, carotid upstroke brisk and symmetric, no bruits, no thyromegaly LUNGS:  Clear to auscultation bilaterally BACK:  No CVA tenderness CHEST:  Unremarkable HEART:  PMI not displaced or sustained,S1 and S2 within normal limits, no S3, no S4, no clicks, no rubs, 2 out of 6 brief  left upper sternal border systolic murmur nonradiating, no diastolic murmurs ABD:  Flat, positive bowel sounds normal in frequency in pitch, no bruits, no rebound, no guarding, no midline pulsatile mass, no hepatomegaly, no splenomegaly EXT:  2 plus pulses throughout, no edema, no cyanosis no clubbing   EKG:  EKG is ordered today. The ekg ordered today demonstrates sinus rhythm, rate 78, axis within normal limits, intervals within normal limits, are surprisingly 1, nonspecific anterior T-wave flattening.   Recent Labs: No results found for requested labs within last 8760 hours.    Lipid Panel    Component Value Date/Time   CHOL 171 04/06/2010 1037   TRIG 82 04/06/2010 1037   HDL 39 (L) 04/06/2010 1037   CHOLHDL 4.4 04/06/2010 1037   VLDL 16 04/06/2010 1037   LDLCALC 116 (H) 04/06/2010 1037      Wt Readings from Last 3 Encounters:  01/19/16 184 lb (83.5 kg)  12/14/15 184 lb 6.4  oz (83.6 kg)  05/23/15 178 lb (80.7 kg)      Other studies Reviewed: Additional studies/ records that were reviewed today include:  VVS notes and CT. Review of the above records demonstrates:  Please see elsewhere in the note.     ASSESSMENT AND PLAN:   Palpitations:  These are not particularly symptomatic at this point.  I will not change her therapy.  Murmur:  Given this and the PFO possibly in her past I will order an ultrasound.  HTN:  The blood pressure is at target. No change in medications is indicated. We will continue with therapeutic lifestyle changes (TLC).  IVC:  She has had a previous DVT.  She's been noted to have stress of this perforating her IVC. She was seen by Dr. Scot Dock and it was decided to follow this.   Current medicines are reviewed at length with the patient today.  The patient does not have concerns regarding medicines.  The following changes have been made:  no change  Labs/ tests ordered today include:   Orders Placed This Encounter  Procedures  . EKG 12-Lead  . ECHOCARDIOGRAM COMPLETE     Disposition:   FU with me in one year.     Signed, Minus Breeding, MD  01/19/2016 3:31 PM    [facility]

## 2016-01-19 ENCOUNTER — Encounter: Payer: Self-pay | Admitting: Cardiology

## 2016-01-19 ENCOUNTER — Ambulatory Visit (INDEPENDENT_AMBULATORY_CARE_PROVIDER_SITE_OTHER): Payer: Federal, State, Local not specified - PPO | Admitting: Cardiology

## 2016-01-19 VITALS — BP 120/84 | HR 78 | Ht 66.0 in | Wt 184.0 lb

## 2016-01-19 DIAGNOSIS — R011 Cardiac murmur, unspecified: Secondary | ICD-10-CM | POA: Diagnosis not present

## 2016-01-19 DIAGNOSIS — R002 Palpitations: Secondary | ICD-10-CM

## 2016-01-19 NOTE — Patient Instructions (Signed)

## 2016-01-26 ENCOUNTER — Encounter (HOSPITAL_COMMUNITY): Payer: Federal, State, Local not specified - PPO | Attending: Physician Assistant

## 2016-01-26 ENCOUNTER — Encounter (HOSPITAL_COMMUNITY): Admission: RE | Admit: 2016-01-26 | Payer: Federal, State, Local not specified - PPO | Source: Ambulatory Visit

## 2016-02-08 DIAGNOSIS — E0500 Thyrotoxicosis with diffuse goiter without thyrotoxic crisis or storm: Secondary | ICD-10-CM | POA: Diagnosis not present

## 2016-02-12 DIAGNOSIS — Z923 Personal history of irradiation: Secondary | ICD-10-CM

## 2016-02-12 DIAGNOSIS — C50919 Malignant neoplasm of unspecified site of unspecified female breast: Secondary | ICD-10-CM

## 2016-02-12 HISTORY — DX: Personal history of irradiation: Z92.3

## 2016-02-12 HISTORY — DX: Malignant neoplasm of unspecified site of unspecified female breast: C50.919

## 2016-02-21 ENCOUNTER — Ambulatory Visit (HOSPITAL_COMMUNITY): Payer: Federal, State, Local not specified - PPO | Attending: Cardiovascular Disease

## 2016-02-21 ENCOUNTER — Other Ambulatory Visit: Payer: Self-pay

## 2016-02-21 DIAGNOSIS — I10 Essential (primary) hypertension: Secondary | ICD-10-CM | POA: Insufficient documentation

## 2016-02-21 DIAGNOSIS — I071 Rheumatic tricuspid insufficiency: Secondary | ICD-10-CM | POA: Insufficient documentation

## 2016-02-21 DIAGNOSIS — I340 Nonrheumatic mitral (valve) insufficiency: Secondary | ICD-10-CM | POA: Insufficient documentation

## 2016-02-21 DIAGNOSIS — R011 Cardiac murmur, unspecified: Secondary | ICD-10-CM | POA: Insufficient documentation

## 2016-02-21 DIAGNOSIS — E785 Hyperlipidemia, unspecified: Secondary | ICD-10-CM | POA: Insufficient documentation

## 2016-02-21 DIAGNOSIS — I371 Nonrheumatic pulmonary valve insufficiency: Secondary | ICD-10-CM | POA: Insufficient documentation

## 2016-02-23 ENCOUNTER — Other Ambulatory Visit (HOSPITAL_COMMUNITY): Payer: Federal, State, Local not specified - PPO

## 2016-03-01 ENCOUNTER — Other Ambulatory Visit (HOSPITAL_COMMUNITY): Payer: Federal, State, Local not specified - PPO

## 2016-05-09 DIAGNOSIS — R109 Unspecified abdominal pain: Secondary | ICD-10-CM | POA: Diagnosis not present

## 2016-05-09 DIAGNOSIS — R110 Nausea: Secondary | ICD-10-CM | POA: Diagnosis not present

## 2016-05-22 DIAGNOSIS — E785 Hyperlipidemia, unspecified: Secondary | ICD-10-CM | POA: Diagnosis not present

## 2016-05-22 DIAGNOSIS — R748 Abnormal levels of other serum enzymes: Secondary | ICD-10-CM | POA: Diagnosis not present

## 2016-05-22 DIAGNOSIS — R5383 Other fatigue: Secondary | ICD-10-CM | POA: Diagnosis not present

## 2016-05-22 DIAGNOSIS — E0500 Thyrotoxicosis with diffuse goiter without thyrotoxic crisis or storm: Secondary | ICD-10-CM | POA: Diagnosis not present

## 2016-06-03 DIAGNOSIS — R197 Diarrhea, unspecified: Secondary | ICD-10-CM | POA: Diagnosis not present

## 2016-06-03 DIAGNOSIS — Z1211 Encounter for screening for malignant neoplasm of colon: Secondary | ICD-10-CM | POA: Diagnosis not present

## 2016-06-06 DIAGNOSIS — R194 Change in bowel habit: Secondary | ICD-10-CM | POA: Diagnosis not present

## 2016-06-06 DIAGNOSIS — Z800 Family history of malignant neoplasm of digestive organs: Secondary | ICD-10-CM | POA: Diagnosis not present

## 2016-06-06 DIAGNOSIS — Z1211 Encounter for screening for malignant neoplasm of colon: Secondary | ICD-10-CM | POA: Diagnosis not present

## 2016-06-13 DIAGNOSIS — K080 Exfoliation of teeth due to systemic causes: Secondary | ICD-10-CM | POA: Diagnosis not present

## 2016-08-07 DIAGNOSIS — E0500 Thyrotoxicosis with diffuse goiter without thyrotoxic crisis or storm: Secondary | ICD-10-CM | POA: Diagnosis not present

## 2016-08-29 DIAGNOSIS — R5383 Other fatigue: Secondary | ICD-10-CM | POA: Diagnosis not present

## 2016-08-30 DIAGNOSIS — Z01419 Encounter for gynecological examination (general) (routine) without abnormal findings: Secondary | ICD-10-CM | POA: Diagnosis not present

## 2016-08-30 DIAGNOSIS — Z124 Encounter for screening for malignant neoplasm of cervix: Secondary | ICD-10-CM | POA: Diagnosis not present

## 2016-08-30 DIAGNOSIS — Z6829 Body mass index (BMI) 29.0-29.9, adult: Secondary | ICD-10-CM | POA: Diagnosis not present

## 2016-09-26 DIAGNOSIS — K080 Exfoliation of teeth due to systemic causes: Secondary | ICD-10-CM | POA: Diagnosis not present

## 2016-10-02 ENCOUNTER — Other Ambulatory Visit: Payer: Self-pay

## 2016-10-07 ENCOUNTER — Other Ambulatory Visit: Payer: Self-pay | Admitting: Physician Assistant

## 2016-10-07 DIAGNOSIS — Z1231 Encounter for screening mammogram for malignant neoplasm of breast: Secondary | ICD-10-CM

## 2016-10-08 ENCOUNTER — Ambulatory Visit
Admission: RE | Admit: 2016-10-08 | Discharge: 2016-10-08 | Disposition: A | Discharge status: Home / Self Care | Payer: Federal, State, Local not specified - PPO | Source: Ambulatory Visit | Attending: Physician Assistant | Admitting: Physician Assistant

## 2016-10-08 DIAGNOSIS — Z1231 Encounter for screening mammogram for malignant neoplasm of breast: Secondary | ICD-10-CM | POA: Diagnosis not present

## 2016-10-10 ENCOUNTER — Other Ambulatory Visit: Payer: Self-pay | Admitting: Physician Assistant

## 2016-10-10 DIAGNOSIS — R928 Other abnormal and inconclusive findings on diagnostic imaging of breast: Secondary | ICD-10-CM

## 2016-10-23 NOTE — Addendum Note (Signed)
Addended by: Lianne Cure A on: 10/23/2016 01:42 PM   Modules accepted: Orders

## 2016-10-25 ENCOUNTER — Inpatient Hospital Stay: Admission: RE | Admit: 2016-10-25 | Payer: Federal, State, Local not specified - PPO | Source: Ambulatory Visit

## 2016-11-06 ENCOUNTER — Other Ambulatory Visit: Payer: Federal, State, Local not specified - PPO

## 2016-11-08 ENCOUNTER — Other Ambulatory Visit: Payer: Self-pay | Admitting: Vascular Surgery

## 2016-11-08 ENCOUNTER — Ambulatory Visit
Admission: RE | Admit: 2016-11-08 | Discharge: 2016-11-08 | Disposition: A | Discharge status: Home / Self Care | Payer: Federal, State, Local not specified - PPO | Source: Ambulatory Visit | Attending: Physician Assistant | Admitting: Physician Assistant

## 2016-11-08 ENCOUNTER — Ambulatory Visit: Payer: Federal, State, Local not specified - PPO

## 2016-11-08 DIAGNOSIS — N6489 Other specified disorders of breast: Secondary | ICD-10-CM

## 2016-11-08 DIAGNOSIS — R922 Inconclusive mammogram: Secondary | ICD-10-CM | POA: Diagnosis not present

## 2016-11-08 DIAGNOSIS — R928 Other abnormal and inconclusive findings on diagnostic imaging of breast: Secondary | ICD-10-CM

## 2016-11-11 ENCOUNTER — Ambulatory Visit
Admission: RE | Admit: 2016-11-11 | Discharge: 2016-11-11 | Disposition: A | Discharge status: Home / Self Care | Payer: Federal, State, Local not specified - PPO | Source: Ambulatory Visit | Attending: Vascular Surgery | Admitting: Vascular Surgery

## 2016-11-11 DIAGNOSIS — N6489 Other specified disorders of breast: Secondary | ICD-10-CM | POA: Diagnosis not present

## 2016-11-11 DIAGNOSIS — C50511 Malignant neoplasm of lower-outer quadrant of right female breast: Secondary | ICD-10-CM | POA: Diagnosis not present

## 2016-11-12 ENCOUNTER — Other Ambulatory Visit: Payer: Self-pay | Admitting: Physician Assistant

## 2016-11-12 DIAGNOSIS — N6489 Other specified disorders of breast: Secondary | ICD-10-CM

## 2016-11-13 ENCOUNTER — Ambulatory Visit
Admission: RE | Admit: 2016-11-13 | Discharge: 2016-11-13 | Disposition: A | Discharge status: Home / Self Care | Payer: Federal, State, Local not specified - PPO | Source: Ambulatory Visit | Attending: Physician Assistant | Admitting: Physician Assistant

## 2016-11-13 ENCOUNTER — Telehealth: Payer: Self-pay | Admitting: Oncology

## 2016-11-13 DIAGNOSIS — N6489 Other specified disorders of breast: Secondary | ICD-10-CM

## 2016-11-13 DIAGNOSIS — C50919 Malignant neoplasm of unspecified site of unspecified female breast: Secondary | ICD-10-CM

## 2016-11-13 DIAGNOSIS — C50911 Malignant neoplasm of unspecified site of right female breast: Secondary | ICD-10-CM | POA: Diagnosis not present

## 2016-11-13 HISTORY — DX: Malignant neoplasm of unspecified site of unspecified female breast: C50.919

## 2016-11-13 NOTE — Telephone Encounter (Signed)
Confirmed appointment for 10/10 for [facility] in the morning, intake form mailed to patient

## 2016-11-14 ENCOUNTER — Encounter: Payer: Self-pay | Admitting: *Deleted

## 2016-11-19 ENCOUNTER — Other Ambulatory Visit: Payer: Self-pay | Admitting: *Deleted

## 2016-11-19 DIAGNOSIS — C50511 Malignant neoplasm of lower-outer quadrant of right female breast: Secondary | ICD-10-CM | POA: Insufficient documentation

## 2016-11-19 DIAGNOSIS — Z170 Estrogen receptor positive status [ER+]: Principal | ICD-10-CM

## 2016-11-19 NOTE — Progress Notes (Signed)
[place]  [phone] [phone]     ID: [name] DOB: 1955-03-22  MR#: [id number]  CNO#:[id number]  Patient Care Team: Shanon Rosser, PA-C as PCP - General (Physician Assistant) Rolm Bookbinder, MD as Consulting Physician (General Surgery) Magrinat, Virgie Dad, MD as Consulting Physician (Oncology) Gery Pray, MD as Consulting Physician (Radiation Oncology)  OTHER MD:  CHIEF COMPLAINT: Estrogen receptor positive breast cancer  CURRENT TREATMENT: Awaiting definitive surgery   HISTORY OF CURRENT ILLNESS: [name] had routine bilateral screening mammography with tomography at the [facility] 10/08/2016. An area of possible distortion in the right breast was noted. She was recalled for right diagnostic mammography with tomography on 11/08/2016. This found the breast density to be category C. The small area of architectural distortion in the lateral right breast persisted and on 11/11/2016 biopsy of this area showed ([id number]) invasive ductal carcinoma, E-cadherin positive, estrogen receptor 95% positive with strong staining intensity, progesterone receptor 5% positive, with strong staining intensity, with an MIB-1 of 10%, and no HER-2 amplification, with a signals ratio of 1.51 and number per cell 3.40. Right axillary ultrasound 11/13/2016 was sonographically benign.  Of note, she has a history of DVT diagnosed March 2010, felt to be possibly related to estrogen replacement therapy. She was coumadinized but developed a pulmonary embolus while on Coumadin. She was then had an IVC filter placed and was anticoagulated with Lovenox for 2 years. An extensive hypercoagulable workup was negative except for an elevated factor VIII level. Her IVC filter is still in place and the patient tells me she is participating in a lawsuit regarding that.   The patient's subsequent history is as detailed below.  INTERVAL HISTORY: [name] was evaluated in the  multidisciplinary breast cancer clinic 11/20/2016 accompanied by her fianc  [name] and her friend [name]..Her case was also presented at the multidisciplinary breast cancer conference on the same day. At that time a preliminary plan was proposed: Breast conserving surgery, consideration of Oncotype, adjuvant radiation, adjuvant anti-estrogens  REVIEW OF SYSTEMS: She denies unusual headaches, visual changes, nausea, vomiting, or dizziness. There has been no unusual cough, phlegm production, or pleurisy. This been no change in bowel or bladder habits. She denies unexplained fatigue or unexplained weight loss, bleeding, rash, or fever. A detailed review of systems was otherwise negative.   PAST MEDICAL HISTORY: Past Medical History:  Diagnosis Date  . DVT (deep venous thrombosis) ([place])    04/2010  . Headache(784.0) 09/22/2012  . Hyperlipemia   . Hyperlipidemia   . Hypertension   . Iron deficiency anemia   . Pulmonary embolism ([place])   . Uterine fibroid   . Vitamin D deficiency    Graves disease    PAST SURGICAL HISTORY: Past Surgical History:  Procedure Laterality Date  . CHOLECYSTECTOMY    . DILATION AND CURETTAGE OF UTERUS     01/2008  . ivc filter      FAMILY HISTORY Family History  Problem Relation Age of Onset  . Drug abuse Brother   . Hypertension Mother   She notes that her father died at age 86 from an accidental head injury.The patient's mother is 71 years old as of October 2018. Pt has one brother and no sisters. Pt reports that a second cousin has a hx of breast cancer and was dx in her 1's. Pt denies family hx of ovarian cancer.   GYNECOLOGIC HISTORY:  No LMP recorded. Patient is not currently having periods (Reason: Perimenopausal). Menarche: 61 years old  Age at first live birth: No children GP: GXP0 LMP: March 2016 Contraceptive: OCP on and off for many years d/c after DVT and PE diagnosis.  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 She denies having pets at home.    ADVANCED DIRECTIVES: Not in place. At the 11/20/2016 visit the patient was given the appropriate documents to complete and notarize at her discretion   HEALTH MAINTENANCE: Social History  Substance Use Topics  . Smoking status: Former Smoker    Packs/day: 0.40    Years: 10.00    Types: Cigarettes    Quit date: 06/01/2002  . Smokeless tobacco: Never Used  . Alcohol use Yes     Comment: occasional 1 drink per month     Colonoscopy: 2013  PAP: 08/30/2016   Bone density: 10/04/2015 with T-score of -0.3 at femur neck right   Allergies  Allergen Reactions  . Amoxicillin-Pot Clavulanate Other (See Comments)    dizziness  . Niacin-Lovastatin Er     Current Outpatient Prescriptions  Medication Sig Dispense Refill  . Ascorbic Acid (VITAMIN C) 1000 MG tablet Take 1,000 mg by mouth daily.    Marland Kitchen aspirin 81 MG tablet Take 81 mg by mouth daily.    Marland Kitchen atorvastatin (LIPITOR) 20 MG tablet Take 20 mg by mouth daily.    . candesartan (ATACAND) 4 MG tablet Take 4 mg by mouth daily.      Marland Kitchen LORazepam (ATIVAN) 0.5 MG tablet Take 0.5 mg by mouth 3 (three) times daily as needed.  0  . methimazole (TAPAZOLE) 10 MG tablet Take 10 mg by mouth daily.     No current facility-administered medications for this visit.     OBJECTIVE: Middle-aged African-American woman in no acute distress  Vitals:   11/20/16 0854  BP: (!) 152/85  Pulse: 73  Resp: 20  Temp: 98.1 F (36.7 C)  SpO2: 100%     Body mass index is 29.05 kg/m.   Wt Readings from Last 3 Encounters:  11/20/16 180 lb (81.6 kg)  01/19/16 184 lb (83.5 kg)  12/14/15 184 lb 6.4 oz (83.6 kg)      ECOG FS:0 - Asymptomatic  Ocular: Sclerae unicteric, pupils round and equal Ear-nose-throat: Oropharynx clear and moist Lymphatic: No cervical or supraclavicular adenopathy Lungs no rales or rhonchi Heart regular rate and rhythm Abd soft, nontender, positive bowel sounds MSK no focal spinal  tenderness, no joint edema Neuro: non-focal, well-oriented, appropriate affect Breasts: The right breast is status post recent biopsy. There is a mild ecchymosis. There are no skin or nipple changes of concern. The left breast is benign. Both axillae are benign.   LAB RESULTS:  CMP     Component Value Date/Time   NA 141 11/20/2016 0822   K 3.6 11/20/2016 0822   CL 105 06/24/2012 1548   CO2 24 11/20/2016 0822   GLUCOSE 102 11/20/2016 0822   GLUCOSE 91 06/24/2012 1548   BUN 19.2 11/20/2016 0822   CREATININE 0.9 11/20/2016 0822   CALCIUM 9.5 11/20/2016 0822   PROT 8.2 11/20/2016 0822   ALBUMIN 3.8 11/20/2016 0822   AST 15 11/20/2016 0822   ALT 15 11/20/2016 0822   ALKPHOS 122 11/20/2016 0822   BILITOT 0.52 11/20/2016 0822   GFRNONAA >60 07/19/2009 0408   GFRAA  07/19/2009 0408    >60        The eGFR has been calculated using the MDRD equation. This calculation has  not been validated in all clinical situations. eGFR's persistently <60 mL/min signify possible Chronic Kidney Disease.    No results found for: TOTALPROTELP, ALBUMINELP, A1GS, A2GS, BETS, BETA2SER, GAMS, MSPIKE, SPEI  No results found for: KPAFRELGTCHN, LAMBDASER, [facility]  Lab Results  Component Value Date   WBC 5.9 11/20/2016   NEUTROABS 3.1 11/20/2016   HGB 12.5 11/20/2016   HCT 38.2 11/20/2016   MCV 97.9 11/20/2016   PLT 158 11/20/2016    _0 @  Lab Results  Component Value Date   LABCA2 17 04/26/2008    No components found for: [id number]  No results for input(s): INR in the last 168 hours.  Lab Results  Component Value Date   LABCA2 17 04/26/2008    No results found for: LJQ492  No results found for: EFE071  No results found for: QRF758  No results found for: CA2729  No components found for: HGQUANT  No results found for: CEA1 / No results found for: CEA1   No results found for: AFPTUMOR  No results found for: [place]  No results found for:  PSA1  Appointment on 11/20/2016  Component Date Value Ref Range Status  . WBC 11/20/2016 5.9  3.9 - 10.3 10e3/uL Final  . NEUT# 11/20/2016 3.1  1.5 - 6.5 10e3/uL Final  . HGB 11/20/2016 12.5  11.6 - 15.9 g/dL Final  . HCT 11/20/2016 38.2  34.8 - 46.6 % Final  . Platelets 11/20/2016 158  145 - 400 10e3/uL Final  . MCV 11/20/2016 97.9  79.5 - 101.0 fL Final  . MCH 11/20/2016 32.1  25.1 - 34.0 pg Final  . MCHC 11/20/2016 32.7  31.5 - 36.0 g/dL Final  . RBC 11/20/2016 3.90  3.70 - 5.45 10e6/uL Final  . RDW 11/20/2016 13.1  11.2 - 14.5 % Final  . lymph# 11/20/2016 2.4  0.9 - 3.3 10e3/uL Final  . MONO# 11/20/2016 0.3  0.1 - 0.9 10e3/uL Final  . Eosinophils Absolute 11/20/2016 0.1  0.0 - 0.5 10e3/uL Final  . Basophils Absolute 11/20/2016 0.0  0.0 - 0.1 10e3/uL Final  . NEUT% 11/20/2016 51.9  38.4 - 76.8 % Final  . LYMPH% 11/20/2016 40.7  14.0 - 49.7 % Final  . MONO% 11/20/2016 4.8  0.0 - 14.0 % Final  . EOS% 11/20/2016 2.4  0.0 - 7.0 % Final  . BASO% 11/20/2016 0.2  0.0 - 2.0 % Final  . Sodium 11/20/2016 141  136 - 145 mEq/L Final  . Potassium 11/20/2016 3.6  3.5 - 5.1 mEq/L Final  . Chloride 11/20/2016 110* 98 - 109 mEq/L Final  . CO2 11/20/2016 24  22 - 29 mEq/L Final  . Glucose 11/20/2016 102  70 - 140 mg/dl Final   Glucose reference range is for nonfasting patients. Fasting glucose reference range is 70- 100.  Marland Kitchen BUN 11/20/2016 19.2  7.0 - 26.0 mg/dL Final  . Creatinine 11/20/2016 0.9  0.6 - 1.1 mg/dL Final  . Total Bilirubin 11/20/2016 0.52  0.20 - 1.20 mg/dL Final  . Alkaline Phosphatase 11/20/2016 122  40 - 150 U/L Final  . AST 11/20/2016 15  5 - 34 U/L Final  . ALT 11/20/2016 15  0 - 55 U/L Final  . Total Protein 11/20/2016 8.2  6.4 - 8.3 g/dL Final  . Albumin 11/20/2016 3.8  3.5 - 5.0 g/dL Final  . Calcium 11/20/2016 9.5  8.4 - 10.4 mg/dL Final  . Anion Gap 11/20/2016 8  3 - 11 mEq/L Final  . EGFR 11/20/2016 >60  >  60 ml/min/1.73 m2 Final   eGFR is calculated using the CKD-EPI  Creatinine Equation (2009)    (this displays the last labs from the last 3 days)  No results found for: TOTALPROTELP, ALBUMINELP, A1GS, A2GS, BETS, BETA2SER, GAMS, MSPIKE, SPEI (this displays SPEP labs)  No results found for: KPAFRELGTCHN, LAMBDASER, KAPLAMBRATIO (kappa/lambda light chains)  No results found for: HGBA, HGBA2QUANT, HGBFQUANT, HGBSQUAN (Hemoglobinopathy evaluation)   No results found for: LDH  Lab Results  Component Value Date   IRON 61 12/25/2012   TIBC 331 12/25/2012   IRONPCTSAT 18 (L) 12/25/2012   (Iron and TIBC)  Lab Results  Component Value Date   FERRITIN 31 12/25/2012    Urinalysis    Component Value Date/Time   COLORURINE LT YELLOW 04/18/2008 1220   APPEARANCEUR Clear 04/18/2008 1220   LABSPEC > OR = 1.030 04/18/2008 1220   PHURINE 5.5 04/18/2008 1220   GLUCOSEU NEGATIVE 04/18/2008 1220   BILIRUBINUR NEGATIVE 04/18/2008 1220   KETONESUR NEGATIVE 04/18/2008 1220   UROBILINOGEN 0.2 mg/dL 04/18/2008 1220   NITRITE Negative 04/18/2008 1220   LEUKOCYTESUR Negative 04/18/2008 1220     STUDIES: Mm Diag Breast Tomo Uni Right  Result Date: 11/08/2016 CLINICAL DATA:  Screening recall for possible architectural distortion in the right breast. EXAM: 2D DIGITAL DIAGNOSTIC UNILATERAL RIGHT MAMMOGRAM WITH CAD AND ADJUNCT TOMO COMPARISON:  Previous exam(s). ACR Breast Density Category c: The breast tissue is heterogeneously dense, which may obscure small masses. FINDINGS: On the spot-compression 3D CC view, small area of architectural distortion seen laterally persists. It is not well-defined on the spot-compression MLO view. Mammographic images were processed with CAD. IMPRESSION: Small area of architectural distortion is noted in the lateral right breast. Stereotactic core needle biopsy is recommended. RECOMMENDATION: 1. Affirm, 3D guided stereotactic core needle biopsy of the right breast architectural distortion. This has been scheduled for November 11, 2016  at 10:30 a.m. I have discussed the findings and recommendations with the patient. Results were also provided in writing at the conclusion of the visit. If applicable, a reminder letter will be sent to the patient regarding the next appointment. BI-RADS CATEGORY  4: Suspicious. Electronically Signed   By: Lajean Manes M.D.   On: 11/08/2016 15:14   [place] Axilla Right  Result Date: 11/13/2016 CLINICAL DATA:  61 year old female with recently diagnosed invasive mammary carcinoma of the right breast. EXAM: ULTRASOUND OF THE RIGHT AXILLA COMPARISON:  Previous exam(s). FINDINGS: Targeted ultrasound is performed, showing multiple morphologically normal lymph nodes within the right axilla. There is no evidence for diffuse or focal cortical thickening nor hilar effacement. IMPRESSION: No suspicious right axillary lymphadenopathy. RECOMMENDATION: Continue treatment plan. I have discussed the findings and recommendations with the patient. Results were also provided in writing at the conclusion of the visit. If applicable, a reminder letter will be sent to the patient regarding the next appointment. BI-RADS CATEGORY  2: Benign. Electronically Signed   By: Kristopher Oppenheim M.D.   On: 11/13/2016 10:34   Mm Clip Placement Right  Result Date: 11/11/2016 CLINICAL DATA:  Evaluate clip placement following stereotactic guided biopsy of lower outer right breast distortion. EXAM: 3D DIAGNOSTIC RIGHT MAMMOGRAM POST STEREOTACTIC BIOPSY COMPARISON:  Previous exam(s). FINDINGS: 3D Mammographic images were obtained following stereotactic guided biopsy of distortion within the lower outer right breast. The coil shaped clip is in satisfactory position at the site of biopsied distortion. IMPRESSION: Satisfactory clip placement following stereotactic guided right breast biopsy. Final Assessment: Post Procedure Mammograms for Marker Placement Electronically  Signed   By: Margarette Canada M.D.   On: 11/11/2016 11:38   Mm Rt Breast Bx W Loc Dev 1st  Lesion Image Bx Spec Stereo Guide  Addendum Date: 11/12/2016   ADDENDUM REPORT: 11/12/2016 15:35 ADDENDUM: Pathology revealed GRADE II INVASIVE MAMMARY CARCINOMA of the Right breast, lower outer quadrant. This was found to be concordant by Dr. Hassan Rowan. Pathology results were discussed with the patient's fiance, [name] by telephone, per patient request. Mr. [name] reported the patient did well after the biopsy with tenderness at the site. Post biopsy instructions and care were reviewed and questions were answered. Mr. [name] was encouraged to call [facility] for any additional concerns. The patient was referred to [facility] at [facility] on November 20, 2016. The patient is scheduled for a Right axillary ultrasound at the [facility] on November 13, 2016. Pathology results reported by Terie Purser, RN on 11/12/2016. Electronically Signed   By: Margarette Canada M.D.   On: 11/12/2016 15:35   Result Date: 11/12/2016 CLINICAL DATA:  61 year old female for tissue sampling of distortion within the lower outer right breast. EXAM: RIGHT BREAST STEREOTACTIC CORE NEEDLE BIOPSY COMPARISON:  Previous exams. FINDINGS: The patient and I discussed the procedure of stereotactic-guided biopsy including benefits and alternatives. We discussed the high likelihood of a successful procedure. We discussed the risks of the procedure including infection, bleeding, tissue injury, clip migration, and inadequate sampling. Informed written consent was given. The usual time out protocol was performed immediately prior to the procedure. Pre biopsy images demonstrate distortion within the lower outer right breast. Using sterile technique and 1% Lidocaine as local anesthetic, under stereotactic guidance, a 9 gauge vacuum assisted device was used to perform core needle biopsy of distortion within the lower outer quadrant of the right breast using a  superior approach. Lesion quadrant: Lower outer quadrant of the right breast At the conclusion of the procedure, a coil shaped tissue marker clip was deployed into the biopsy cavity. Follow-up 2-view mammogram was performed and dictated separately. IMPRESSION: Stereotactic-guided biopsy of lower outer right breast distortion. No apparent complications. Electronically Signed: By: Margarette Canada M.D. On: 11/11/2016 11:36    ELIGIBLE FOR AVAILABLE RESEARCH PROTOCOL: no  ASSESSMENT: 61 y.o. [place] woman status post right breast lower outer quadrant biopsy 11/11/2016 for a clinical TX N), stage I invasive ductal carcinoma, grade 2, E-cadherin positive, estrogen receptor 95% positive, progesterone receptor 5% positive, with an MIB-1 of 10%, and no HER-2 amplification  (1) breast conserving surgery with sentinel lymph node sampling pending  (2) Oncotype DX to be obtained from the definitive surgical sample: Chemotherapy not anticipated  (3) adjuvant radiation to follow  (4) anti-estrogens to start at the completion of local treatment.  (5) history of DVT/PE March 2010, with negative extensive hypercoagulable workup, IVC filter in place  PLAN: We spent the better part of today's hour-long appointment discussing the biology of her diagnosis and the specifics of her situation. We first reviewed the fact that cancer is not one disease but more than 100 different diseases and that it is important to keep them separate-- otherwise when friends and relatives discuss their own cancer experiences with [place] confusion can result. Similarly we explained that if breast cancer spreads to the bone or liver, the patient would not have bone cancer or liver cancer, but breast cancer in the bone and breast cancer in the liver: one cancer in three places--  not 3 different cancers which otherwise would have to be treated in 3 different ways.  We discussed the difference between local and systemic therapy. In terms of  loco-regional treatment, lumpectomy plus radiation is equivalent to mastectomy as far as survival is concerned. For this reason, and because the cosmetic results are generally superior, we recommend breast conserving surgery followed by radiation. [name] is comfortable with this choice  We then discussed the rationale for systemic therapy. There is some risk that this cancer may have already spread to other parts of her body. Patients frequently ask at this point about bone scans, CAT scans and PET scans to find out if they have occult breast cancer somewhere else. The problem is that in early stage disease we are much more likely to find false positives then true cancers and this would expose the patient to unnecessary procedures as well as unnecessary radiation. Scans cannot answer the question the patient really would like to know, which is whether she has microscopic disease elsewhere in her body. For those reasons we do not recommend them.  Of course we would proceed to aggressive evaluation of any symptoms that might suggest metastatic disease, but that is not the case here.  Next we went over the options for systemic therapy which are anti-estrogens, anti-HER-2 immunotherapy, and chemotherapy. [name] does not meet criteria for anti-HER-2 immunotherapy. She is a good candidate for anti-estrogens.  The question of chemotherapy is more complicated. Chemotherapy is most effective in rapidly growing, aggressive tumors. It is much less effective in slow growing cancers, like [name] 's. For that reason we are going to request an Oncotype from the definitive surgical sample, as suggested by NCCN guidelines. She understands I anticipate she likely will not need chemotherapy, but we will not know for sure until we actually have the results on hand  The overall plan, then, is for breast conserving surgery with sentinel lymph node sampling, Oncotype DX testing, chemotherapy if appropriate, adjuvant radiation and  adjuvant anti-estrogens.  [name] has a good understanding of the overall plan. She agrees with it. She knows the goal of treatment in her case is cure. She will call with any problems that may develop before her next visit here.  Chauncey Cruel, MD 11/20/16  11:29 AM Medical Oncology and Hematology [facility] [address] [place], [place] [address] Tel. [phone] Fax. [phone]   This document serves as a record of services personally performed by Lurline Del, MD. It was created on her behalf by Steva Colder, a trained medical scribe. The creation of this record is based on the scribe's personal observations and the provider's statements to them. This document has been checked and approved by the attending provider.

## 2016-11-20 ENCOUNTER — Other Ambulatory Visit: Payer: Self-pay | Admitting: General Surgery

## 2016-11-20 ENCOUNTER — Other Ambulatory Visit (HOSPITAL_BASED_OUTPATIENT_CLINIC_OR_DEPARTMENT_OTHER): Payer: Federal, State, Local not specified - PPO

## 2016-11-20 ENCOUNTER — Ambulatory Visit: Payer: Federal, State, Local not specified - PPO | Attending: General Surgery | Admitting: Physical Therapy

## 2016-11-20 ENCOUNTER — Encounter: Payer: Self-pay | Admitting: Physical Therapy

## 2016-11-20 ENCOUNTER — Ambulatory Visit
Admission: RE | Admit: 2016-11-20 | Discharge: 2016-11-20 | Disposition: A | Discharge status: Home / Self Care | Payer: Federal, State, Local not specified - PPO | Source: Ambulatory Visit | Attending: Radiation Oncology | Admitting: Radiation Oncology

## 2016-11-20 ENCOUNTER — Encounter: Payer: Self-pay | Admitting: Oncology

## 2016-11-20 ENCOUNTER — Ambulatory Visit (HOSPITAL_BASED_OUTPATIENT_CLINIC_OR_DEPARTMENT_OTHER): Payer: Federal, State, Local not specified - PPO | Admitting: Oncology

## 2016-11-20 ENCOUNTER — Encounter: Payer: Self-pay | Admitting: *Deleted

## 2016-11-20 VITALS — BP 152/85 | HR 73 | Temp 98.1°F | Resp 20 | Ht 66.0 in | Wt 180.0 lb

## 2016-11-20 DIAGNOSIS — R293 Abnormal posture: Secondary | ICD-10-CM

## 2016-11-20 DIAGNOSIS — C50511 Malignant neoplasm of lower-outer quadrant of right female breast: Secondary | ICD-10-CM

## 2016-11-20 DIAGNOSIS — Z170 Estrogen receptor positive status [ER+]: Secondary | ICD-10-CM | POA: Insufficient documentation

## 2016-11-20 DIAGNOSIS — I2782 Chronic pulmonary embolism: Secondary | ICD-10-CM

## 2016-11-20 DIAGNOSIS — Z86718 Personal history of other venous thrombosis and embolism: Secondary | ICD-10-CM | POA: Diagnosis not present

## 2016-11-20 DIAGNOSIS — E785 Hyperlipidemia, unspecified: Secondary | ICD-10-CM | POA: Insufficient documentation

## 2016-11-20 DIAGNOSIS — I10 Essential (primary) hypertension: Secondary | ICD-10-CM | POA: Insufficient documentation

## 2016-11-20 DIAGNOSIS — E559 Vitamin D deficiency, unspecified: Secondary | ICD-10-CM | POA: Insufficient documentation

## 2016-11-20 DIAGNOSIS — Z86711 Personal history of pulmonary embolism: Secondary | ICD-10-CM | POA: Insufficient documentation

## 2016-11-20 DIAGNOSIS — D509 Iron deficiency anemia, unspecified: Secondary | ICD-10-CM | POA: Insufficient documentation

## 2016-11-20 DIAGNOSIS — Z9889 Other specified postprocedural states: Secondary | ICD-10-CM | POA: Insufficient documentation

## 2016-11-20 DIAGNOSIS — I2699 Other pulmonary embolism without acute cor pulmonale: Secondary | ICD-10-CM

## 2016-11-20 DIAGNOSIS — Z510 Encounter for antineoplastic radiation therapy: Secondary | ICD-10-CM | POA: Insufficient documentation

## 2016-11-20 HISTORY — DX: Other pulmonary embolism without acute cor pulmonale: I26.99

## 2016-11-20 LAB — COMPREHENSIVE METABOLIC PANEL
ALT: 15 U/L (ref 0–55)
AST: 15 U/L (ref 5–34)
Albumin: 3.8 g/dL (ref 3.5–5.0)
Alkaline Phosphatase: 122 U/L (ref 40–150)
Anion Gap: 8 mEq/L (ref 3–11)
BUN: 19.2 mg/dL (ref 7.0–26.0)
CALCIUM: 9.5 mg/dL (ref 8.4–10.4)
CHLORIDE: 110 meq/L — AB (ref 98–109)
CO2: 24 meq/L (ref 22–29)
CREATININE: 0.9 mg/dL (ref 0.6–1.1)
EGFR: >60 mL/min/{1.73_m2} (ref >60)
GLUCOSE: 102 mg/dL (ref 70–140)
Potassium: 3.6 mEq/L (ref 3.5–5.1)
Sodium: 141 mEq/L (ref 136–145)
Total Bilirubin: 0.52 mg/dL (ref 0.20–1.20)
Total Protein: 8.2 g/dL (ref 6.4–8.3)

## 2016-11-20 LAB — CBC WITH DIFFERENTIAL/PLATELET
BASO%: 0.2 % (ref 0.0–2.0)
Basophils Absolute: 0 10*3/uL (ref 0.0–0.1)
EOS%: 2.4 % (ref 0.0–7.0)
Eosinophils Absolute: 0.1 10*3/uL (ref 0.0–0.5)
HEMATOCRIT: 38.2 % (ref 34.8–46.6)
HGB: 12.5 g/dL (ref 11.6–15.9)
LYMPH#: 2.4 10*3/uL (ref 0.9–3.3)
LYMPH%: 40.7 % (ref 14.0–49.7)
MCH: 32.1 pg (ref 25.1–34.0)
MCHC: 32.7 g/dL (ref 31.5–36.0)
MCV: 97.9 fL (ref 79.5–101.0)
MONO#: 0.3 10*3/uL (ref 0.1–0.9)
MONO%: 4.8 % (ref 0.0–14.0)
NEUT#: 3.1 10*3/uL (ref 1.5–6.5)
NEUT%: 51.9 % (ref 38.4–76.8)
Platelets: 158 10*3/uL (ref 145–400)
RBC: 3.9 10*6/uL (ref 3.70–5.45)
RDW: 13.1 % (ref 11.2–14.5)
WBC: 5.9 10*3/uL (ref 3.9–10.3)

## 2016-11-20 NOTE — Progress Notes (Signed)
Nutrition Assessment  Reason for Assessment:  Pt seen in Breast Clinic  ASSESSMENT:   61 year old female with new diagnosis of breast cancer.  Past medical history of HTN.   Medications:  reviewed  Labs: reviewed  Anthropometrics:   Height: 66 inches Weight: 184 lb BMI: 29.8   NUTRITION DIAGNOSIS: Food and nutrition related knowledge deficit related to new diagnosis of breast cancer as evidenced by no prior need for nutrition related information.  INTERVENTION:   Discussed and provided packet of information regarding nutritional tips for breast cancer patients.  Questions answered.  Teachback method used.  Contact information provided and patient knows to contact me with questions/concerns.    MONITORING, EVALUATION, and GOAL: Pt will consume a healthy plant based diet to maintain lean body mass throughout treatment.   [name] B. Zenia Resides, [place], [place] Registered Dietitian [phone] (pager)

## 2016-11-20 NOTE — Progress Notes (Signed)
Radiation Oncology         [phone] ________________________________  Initial Outpatient Consultation  Name: [name] MRN: [id number]  Date: 11/20/2016  DOB: [birth year]  CA:CLJQ, Lorin Picket, PA-C  Emelia Loron, MD   REFERRING PHYSICIAN: Emelia Loron, MD  DIAGNOSIS: The encounter diagnosis was Malignant neoplasm of lower-outer quadrant of right breast of female, estrogen receptor positive (HCC). Clinical stage I invasive ductal carcinoma of the right breast  HISTORY OF PRESENT ILLNESS::[name] is a 61 y.o. female who is here for follow up of her right breast cancer. Initially, the patient presented for screening mammography on 10/08/16. Possible area of distortion was seen within the right breast. On 11/08/16 she further underwent diagnostic mammography which showed architectural distortion noted within the lateral right breast. She underwent needle core biopsy on 11/11/16 which showed invasive mammary carcinoma. Histological analysis revealed this to be ER(+) strongly 95%, PR(+) 5%, and HER2(-). US of the right axilla was negative for suspicion of malignancy.   PREVIOUS RADIATION THERAPY: No  PAST MEDICAL HISTORY:  has a past medical history of DVT (deep venous thrombosis) (HCC); WIIGWVIJ(656.5) (09/22/2012); Hyperlipemia; Hyperlipidemia; Hypertension; Iron deficiency anemia; Pulmonary embolism (HCC); Uterine fibroid; and Vitamin D deficiency.    PAST SURGICAL HISTORY: Past Surgical History:  Procedure Laterality Date  . CHOLECYSTECTOMY    . DILATION AND CURETTAGE OF UTERUS     01/2008  . ivc filter      FAMILY HISTORY: family history includes Drug abuse in her brother; Hypertension in her mother.   SOCIAL HISTORY:  reports that she quit smoking about 14 years ago. Her smoking use included Cigarettes. She has a 4.00 pack-year smoking history. She has never used smokeless tobacco. She reports that she drinks alcohol. She reports that she does not use  drugs.  ALLERGIES: Amoxicillin-pot clavulanate and Niacin-lovastatin er  MEDICATIONS:  Current Outpatient Prescriptions  Medication Sig Dispense Refill  . Ascorbic Acid (VITAMIN C) 1000 MG tablet Take 1,000 mg by mouth daily.    Marland Kitchen aspirin 81 MG tablet Take 81 mg by mouth daily.    Marland Kitchen atorvastatin (LIPITOR) 20 MG tablet Take 20 mg by mouth daily.    . candesartan (ATACAND) 4 MG tablet Take 4 mg by mouth daily.      Marland Kitchen LORazepam (ATIVAN) 0.5 MG tablet Take 0.5 mg by mouth 3 (three) times daily as needed.  0  . methimazole (TAPAZOLE) 10 MG tablet Take 10 mg by mouth daily.     No current facility-administered medications for this encounter.     REVIEW OF SYSTEMS:  REVIEW OF SYSTEMS: A 10+ POINT REVIEW OF SYSTEMS WAS OBTAINED including neurology, dermatology, psychiatry, cardiac, respiratory, lymph, extremities, GI, GU, musculoskeletal, constitutional, reproductive, HEENT. All pertinent positives are noted in the HPI. All others are negative. She denies any breast pain, nipple discharge or bleeding prior to diagnosis.    PHYSICAL EXAM:   Vitals - 1 value per visit 11/20/2016  SYSTOLIC 152  DIASTOLIC 85  Pulse 73  Temperature 98.1  Respirations 20  Weight (lb) 180  Height 5\' 6"   BMI 29.05   Lungs are clear to auscultation bilaterally. Heart has regular rate and rhythm. No palpable cervical, supraclavicular, or axillary adenopathy. Abdomen soft, non-tender, normal bowel sounds. Left breast no palpable mass or nipple discharge. Right breast pt has some bruising in the LOQ with some thickening but no discrete masses palpable. No nipple discharge or bleeding.   ECOG = 0  LABORATORY DATA:  Lab Results  Component Value  Date   WBC 5.9 11/20/2016   HGB 12.5 11/20/2016   HCT 38.2 11/20/2016   MCV 97.9 11/20/2016   PLT 158 11/20/2016   NEUTROABS 3.1 11/20/2016   Lab Results  Component Value Date   NA 141 11/20/2016   K 3.6 11/20/2016   CL 105 06/24/2012   CO2 24 11/20/2016   GLUCOSE  102 11/20/2016   CREATININE 0.9 11/20/2016   CALCIUM 9.5 11/20/2016      RADIOGRAPHY: Mm Diag Breast Tomo Uni Right  Result Date: 11/08/2016 CLINICAL DATA:  Screening recall for possible architectural distortion in the right breast. EXAM: 2D DIGITAL DIAGNOSTIC UNILATERAL RIGHT MAMMOGRAM WITH CAD AND ADJUNCT TOMO COMPARISON:  Previous exam(s). ACR Breast Density Category c: The breast tissue is heterogeneously dense, which may obscure small masses. FINDINGS: On the spot-compression 3D CC view, small area of architectural distortion seen laterally persists. It is not well-defined on the spot-compression MLO view. Mammographic images were processed with CAD. IMPRESSION: Small area of architectural distortion is noted in the lateral right breast. Stereotactic core needle biopsy is recommended. RECOMMENDATION: 1. Affirm, 3D guided stereotactic core needle biopsy of the right breast architectural distortion. This has been scheduled for November 11, 2016 at 10:30 a.m. I have discussed the findings and recommendations with the patient. Results were also provided in writing at the conclusion of the visit. If applicable, a reminder letter will be sent to the patient regarding the next appointment. BI-RADS CATEGORY  4: Suspicious. Electronically Signed   By: Lajean Manes M.D.   On: 11/08/2016 15:14   [place] Axilla Right  Result Date: 11/13/2016 CLINICAL DATA:  61 year old female with recently diagnosed invasive mammary carcinoma of the right breast. EXAM: ULTRASOUND OF THE RIGHT AXILLA COMPARISON:  Previous exam(s). FINDINGS: Targeted ultrasound is performed, showing multiple morphologically normal lymph nodes within the right axilla. There is no evidence for diffuse or focal cortical thickening nor hilar effacement. IMPRESSION: No suspicious right axillary lymphadenopathy. RECOMMENDATION: Continue treatment plan. I have discussed the findings and recommendations with the patient. Results were also provided in writing  at the conclusion of the visit. If applicable, a reminder letter will be sent to the patient regarding the next appointment. BI-RADS CATEGORY  2: Benign. Electronically Signed   By: Kristopher Oppenheim M.D.   On: 11/13/2016 10:34   Mm Clip Placement Right  Result Date: 11/11/2016 CLINICAL DATA:  Evaluate clip placement following stereotactic guided biopsy of lower outer right breast distortion. EXAM: 3D DIAGNOSTIC RIGHT MAMMOGRAM POST STEREOTACTIC BIOPSY COMPARISON:  Previous exam(s). FINDINGS: 3D Mammographic images were obtained following stereotactic guided biopsy of distortion within the lower outer right breast. The coil shaped clip is in satisfactory position at the site of biopsied distortion. IMPRESSION: Satisfactory clip placement following stereotactic guided right breast biopsy. Final Assessment: Post Procedure Mammograms for Marker Placement Electronically Signed   By: Margarette Canada M.D.   On: 11/11/2016 11:38   Mm Rt Breast Bx W Loc Dev 1st Lesion Image Bx Spec Stereo Guide  Addendum Date: 11/12/2016   ADDENDUM REPORT: 11/12/2016 15:35 ADDENDUM: Pathology revealed GRADE II INVASIVE MAMMARY CARCINOMA of the Right breast, lower outer quadrant. This was found to be concordant by Dr. Hassan Rowan. Pathology results were discussed with the patient's fiance, [name] by telephone, per patient request. [name] reported the patient did well after the biopsy with tenderness at the site. Post biopsy instructions and care were reviewed and questions were answered. [name] was encouraged to call [facility] for any additional concerns. The patient was referred to [facility] on November 20, 2016. The patient is scheduled for a Right axillary ultrasound at the [facility] on November 13, 2016. Pathology results reported by Terie Purser, RN on 11/12/2016. Electronically Signed   By: Margarette Canada M.D.   On:  11/12/2016 15:35   Result Date: 11/12/2016 CLINICAL DATA:  61 year old female for tissue sampling of distortion within the lower outer right breast. EXAM: RIGHT BREAST STEREOTACTIC CORE NEEDLE BIOPSY COMPARISON:  Previous exams. FINDINGS: The patient and I discussed the procedure of stereotactic-guided biopsy including benefits and alternatives. We discussed the high likelihood of a successful procedure. We discussed the risks of the procedure including infection, bleeding, tissue injury, clip migration, and inadequate sampling. Informed written consent was given. The usual time out protocol was performed immediately prior to the procedure. Pre biopsy images demonstrate distortion within the lower outer right breast. Using sterile technique and 1% Lidocaine as local anesthetic, under stereotactic guidance, a 9 gauge vacuum assisted device was used to perform core needle biopsy of distortion within the lower outer quadrant of the right breast using a superior approach. Lesion quadrant: Lower outer quadrant of the right breast At the conclusion of the procedure, a coil shaped tissue marker clip was deployed into the biopsy cavity. Follow-up 2-view mammogram was performed and dictated separately. IMPRESSION: Stereotactic-guided biopsy of lower outer right breast distortion. No apparent complications. Electronically Signed: By: Margarette Canada M.D. On: 11/11/2016 11:36      IMPRESSION: [name] is a wonderful 61 y.o. female who presents today for ongoing management of clinical stage I invasive ductal carcinoma of the right breast. Pt would be a good candidate with breast conservation with radiation directed at the right breast. We discussed the potential treatment, side effects, and potential toxicities and she wishes to agree with breast conservation therapy.   PLAN: Right lumpectomy and sentinel node procedure followed oncotype Dx testing to determine the benefit of adjuvant chemotherapy. Pt will proceed with  radiation therapy as breast conservation treatment followed by aromatase inhibitor.      ------------------------------------------------  Blair Promise, PhD, MD  This document serves as a record of services personally performed by Gery Pray, MD. It was created on his behalf by Reola Mosher, a trained medical scribe. The creation of this record is based on the scribe's personal observations and the provider's statements to them. This document has been checked and approved by the attending provider.

## 2016-11-20 NOTE — Progress Notes (Signed)
Clinical Social Work [place] Psychosocial Distress Screening [name]  Patient completed distress screening protocol and scored a 9 on the Psychosocial Distress Thermometer which indicates severe distress. Clinical Social Worker met with patient and patients family in [facility] to assess for distress and other psychosocial needs. Patient stated she was feeling overwhelmed but felt "better" after meeting with the treatment team and getting more information on her treatment plan. CSW and patient discussed common feeling and emotions when being diagnosed with cancer, and the importance of support during treatment. CSW informed patient of the support team and support services at [facility]. CSW provided contact information and encouraged patient to call with any questions or concerns.  ONCBCN DISTRESS SCREENING 11/20/2016  Screening Type Initial Screening  Distress experienced in past week (1-10) 9  Emotional problem type Nervousness/Anxiety;Adjusting to illness    [name], MSW, LCSW, OSW-C Clinical Social Worker [facility] [phone]

## 2016-11-20 NOTE — Therapy (Signed)
[place], [place], [address] Phone: [phone]   Fax:  [phone]  Physical Therapy Evaluation  Patient Details  Name: [name] MRN: [id number] Date of Birth: September 11, 1955 Referring Provider: Dr. Rolm Bookbinder  Encounter Date: 11/20/2016      PT End of Session - 11/20/16 1120    Visit Number 1   Number of Visits 2   Date for PT Re-Evaluation --  Will reassess 3-4 weeks after surgery   PT Start Time 0946   PT Stop Time 9407  Also saw pt from 1050-1107 for a total of 26 minutes   PT Time Calculation (min) 9 min   Activity Tolerance Patient tolerated treatment well   Behavior During Therapy [facility] for tasks assessed/performed      Past Medical History:  Diagnosis Date  . DVT (deep venous thrombosis) ([place])    04/2010  . Headache(784.0) 09/22/2012  . Hyperlipemia   . Hyperlipidemia   . Hypertension   . Iron deficiency anemia   . Pulmonary embolism ([place])   . Uterine fibroid   . Vitamin D deficiency    Graves disease    Past Surgical History:  Procedure Laterality Date  . CHOLECYSTECTOMY    . DILATION AND CURETTAGE OF UTERUS     01/2008  . ivc filter      There were no vitals filed for this visit.       Subjective Assessment - 11/20/16 1112    Subjective Patient reports she is here to be seen by her medical team for her newly diagnosed right breast cancer.   Patient is accompained by: Family member   Pertinent History Patient was diagnosed on 10/08/16 with right grade 2 invasive ductal carcinoma breast cancer. It is an area of distortion that is ER/PR positive and HER2 negative with a Ki67 of 10%. She has a history of a PE with an IVC filter in place and a diagnosis of Graves disease in 2016.   Patient Stated Goals Reduce lymphedema risk and learn post op shoulder ROM HEP   Currently in Pain? No/denies  Intermittent low back pain            [facility] PT Assessment - 11/20/16  0001      Assessment   Medical Diagnosis Right breast cancer   Referring Provider Dr. Rolm Bookbinder   Onset Date/Surgical Date 10/08/16   Hand Dominance Right   Prior Therapy none     Precautions   Precautions Other (comment)   Precaution Comments active breast cancer     Restrictions   Weight Bearing Restrictions No     Balance Screen   Has the patient fallen in the past 6 months Yes   How many times? 1  Tripped on stairs   Has the patient had a decrease in activity level because of a fear of falling?  No   Is the patient reluctant to leave their home because of a fear of falling?  No     Home Environment   Living Environment Private residence   Living Arrangements Alone   Available Help at Discharge Family     Prior Function   Level of Independence Independent   Vocation Full time employment   Vocation Requirements Tax specialist for [employer] She does not exercise     Cognition   Overall Cognitive Status Within Functional Limits for tasks assessed     Posture/Postural Control   Posture/Postural Control Postural limitations  Postural Limitations Forward head;Rounded Shoulders     ROM / Strength   AROM / PROM / Strength AROM;Strength     AROM   AROM Assessment Site Shoulder;Cervical   Right/Left Shoulder Right;Left   Right Shoulder Extension 52 Degrees   Right Shoulder Flexion 141 Degrees   Right Shoulder ABduction 157 Degrees   Right Shoulder Internal Rotation 64 Degrees   Right Shoulder External Rotation 86 Degrees   Left Shoulder Extension 57 Degrees   Left Shoulder Flexion 144 Degrees   Left Shoulder ABduction 154 Degrees   Left Shoulder Internal Rotation 61 Degrees   Left Shoulder External Rotation 90 Degrees   Cervical Flexion WNL   Cervical Extension WNL   Cervical - Right Side Bend WNL   Cervical - Left Side Bend WNL   Cervical - Right Rotation WNL   Cervical - Left Rotation WNL     Strength   Overall Strength Within functional limits  for tasks performed           LYMPHEDEMA/ONCOLOGY QUESTIONNAIRE - 11/20/16 1119      Type   Cancer Type Right breast cancer     Lymphedema Assessments   Lymphedema Assessments Upper extremities     Right Upper Extremity Lymphedema   10 cm Proximal to Olecranon Process 30.9 cm   Olecranon Process 27.6 cm   10 cm Proximal to Ulnar Styloid Process 23.5 cm   Just Proximal to Ulnar Styloid Process 16.9 cm   Across Hand at [employer] 19.2 cm   At [place] of 2nd Digit 6.6 cm     Left Upper Extremity Lymphedema   10 cm Proximal to Olecranon Process 31.9 cm   Olecranon Process 28 cm   10 cm Proximal to Ulnar Styloid Process 23.8 cm   Just Proximal to Ulnar Styloid Process 16.9 cm   Across Hand at [employer] 20.7 cm   At [place] of 2nd Digit 6.7 cm         Objective measurements completed on examination: See above findings.       Patient was instructed today in a home exercise program today for post op shoulder range of motion. These included active assist shoulder flexion in sitting, scapular retraction, wall walking with shoulder abduction, and hands behind head external rotation.  She was encouraged to do these twice a day, holding 3 seconds and repeating 5 times when permitted by her physician.               PT Education - 11/20/16 1120    Education provided Yes   Education Details Lymphedema risk reduction and post op shoulder ROM HEP   Person(s) Educated Patient;Other (comment)  Fiancee and friend   Methods Explanation;Demonstration;Handout   Comprehension Returned demonstration;Verbalized understanding              Breast Clinic Goals - 11/20/16 1124      Patient will be able to verbalize understanding of pertinent lymphedema risk reduction practices relevant to her diagnosis specifically related to skin care.   Time 1   Period Days   Status Achieved     Patient will be able to return demonstrate and/or verbalize understanding of the  post-op home exercise program related to regaining shoulder range of motion.   Time 1   Period Days   Status Achieved     Patient will be able to verbalize understanding of the importance of attending the postoperative After Breast Cancer Class for further lymphedema risk reduction education  and therapeutic exercise.   Time 1   Period Days   Status Achieved               Plan - 11/20/16 1121    Clinical Impression Statement Patient was diagnosed on 10/08/16 with right grade 2 invasive ductal carcinoma breast cancer. It is an area of distortion that is ER/PR positive and HER2 negative with a Ki67 of 10%. She has a history of a PE with an IVC filter in place and a diagnosis of Graves disease in 2016. Her multidisciplinary medical team met prior to her assessments to determine a recommended treatment plan. She is planning to have a right lumpectomy and sentinel node biopsy followed by Oncotype testing, radiation, and anti-estrogen therapy. She will benefit from post op PT for a reassessment at 3-4 weeks after surgery to detemine PT needs.   History and Personal Factors relevant to plan of care: Lives alone; hx PE; recent cancer diagnosis   Clinical Presentation Stable   Clinical Decision Making Low   Rehab Potential Excellent   Clinical Impairments Affecting Rehab Potential None   PT Frequency --  Eval and 1 f/u visit   PT Treatment/Interventions ADLs/Self Care Home Management;Therapeutic exercise;Patient/family education   PT Next Visit Plan Will reassess 3-4 weeks post op   PT Home Exercise Plan Post op shoulder ROM HEP   Consulted and Agree with Plan of Care Patient      Patient will benefit from skilled therapeutic intervention in order to improve the following deficits and impairments:  Decreased range of motion, Impaired UE functional use, Pain, Decreased knowledge of precautions, Postural dysfunction  Visit Diagnosis: Carcinoma of lower-outer quadrant of right breast in  female, estrogen receptor positive ([place]) - Plan: PT plan of care cert/re-cert  Abnormal posture - Plan: PT plan of care cert/re-cert   Patient will follow up at outpatient cancer rehab 3-4 weeks following surgery.  If the patient requires physical therapy at that time, a specific plan will be dictated and sent to the referring physician for approval. The patient was educated today on appropriate basic range of motion exercises to begin post operatively and the importance of attending the After Breast Cancer class following surgery.  Patient was educated today on lymphedema risk reduction practices as it pertains to recommendations that will benefit the patient immediately following surgery.  She verbalized good understanding.      Problem List Patient Active Problem List   Diagnosis Date Noted  . Pulmonary emboli ([place]) 11/20/2016  . Malignant neoplasm of lower-outer quadrant of right breast of female, estrogen receptor positive ([place]) 11/19/2016  . Graves disease 10/19/2014  . Palpitation 09/24/2013  . Headache(784.0) 09/22/2012  . Dysarthria 09/22/2012  . LEG PAIN, RIGHT 04/22/2008  . VITAMIN B12 DEFICIENCY 04/19/2008  . ANEMIA, IRON DEFICIENCY 04/19/2008  . HYPERLIPIDEMIA 03/30/2008  . HYPERTENSION 03/30/2008  . ALLERGIC RHINITIS 03/30/2008  . GERD 03/30/2008    Annia Friendly, PT 11/20/16 11:26 AM  [place], [place], [address] Phone: [phone]   Fax:  [phone]  Name: [name] MRN: [id number] Date of Birth: 01-20-1956

## 2016-11-20 NOTE — Patient Instructions (Signed)

## 2016-11-25 ENCOUNTER — Other Ambulatory Visit: Payer: Self-pay | Admitting: *Deleted

## 2016-11-25 ENCOUNTER — Encounter: Payer: Self-pay | Admitting: *Deleted

## 2016-11-25 ENCOUNTER — Telehealth: Payer: Self-pay

## 2016-11-25 DIAGNOSIS — D500 Iron deficiency anemia secondary to blood loss (chronic): Secondary | ICD-10-CM

## 2016-11-25 NOTE — Telephone Encounter (Signed)
lvm at provided contact number regarding her question of whether or not she can have a root canal performed.  Pt currently is not scheduled to start chemo/biotherapy or biphosphonate therapy.  Pt see GM again on 12/03.  Msg instructed pt to get root canal as soon as possible so it does not interfere with her next appt with GM.

## 2016-11-26 ENCOUNTER — Other Ambulatory Visit: Payer: Self-pay | Admitting: General Surgery

## 2016-11-26 DIAGNOSIS — Z170 Estrogen receptor positive status [ER+]: Principal | ICD-10-CM

## 2016-11-26 DIAGNOSIS — C50511 Malignant neoplasm of lower-outer quadrant of right female breast: Secondary | ICD-10-CM

## 2016-11-27 ENCOUNTER — Telehealth: Payer: Self-pay | Admitting: *Deleted

## 2016-11-27 NOTE — Telephone Encounter (Signed)
  Oncology Nurse Navigator Documentation  Navigator Location: [facility] (11/27/16 1600)   )Navigator Encounter Type: Telephone (11/27/16 1600) Telephone: Outgoing Call;Clinic/MDC Follow-up (11/27/16 1600)     Surgery Date: 12/16/16 (11/27/16 1600)           Treatment Initiated Date: 12/16/16 (11/27/16 1600)                                Time Spent with Patient: 15 (11/27/16 1600)

## 2016-11-28 ENCOUNTER — Telehealth: Payer: Self-pay | Admitting: Oncology

## 2016-11-28 NOTE — Telephone Encounter (Signed)
Spoke with patient regarding appts added per 10/10 los.

## 2016-12-10 NOTE — Pre-Procedure Instructions (Signed)
[name]  12/10/2016      [id number] [name], [place] [address] [place] [address] Phone: [phone] Fax: [phone]  [id number] [place], [place] - [address] [facility] [place] [address] [place] [address] Phone: [phone] Fax: [phone]    Your procedure is scheduled on November 5  Report to [facility] Admitting at 0800 A.M.  Call this number if you have problems the morning of surgery:  [phone]   Remember:  Do not eat food or drink liquids after midnight.  Continue all other medications as directed by your physician except follow these medication instructions before surgery  Please complete your 8oz of Boost Breeze or Water that was given to you at your preadmission appointment by 0600am on the [place]   Take these medicines the morning of surgery with A SIP OF WATER  LORazepam (ATIVAN Eye drops if needed  7 days prior to surgery STOP taking any Aspirin (unless otherwise instructed by your surgeon), Aleve, Naproxen, Ibuprofen, Motrin, Advil, Goody's, BC's, all herbal medications, fish oil, and all vitamins    Do not wear jewelry, make-up or nail polish.  Do not wear lotions, powders, or perfumes, or deoderant.  Do not shave 48 hours prior to surgery.  Men may shave face and neck.  Do not bring valuables to the hospital.  [facility] is not responsible for any belongings or valuables.  Contacts, dentures or bridgework may not be worn into surgery.  Leave your suitcase in the car.  After surgery it may be brought to your room.  For patients admitted to the hospital, discharge time will be determined by your treatment team.  Patients discharged the day of surgery will not be allowed to drive home.    Special instructions:   [place]- Preparing For Surgery  Before surgery, you can play an important role. Because skin is not sterile,  your skin needs to be as free of germs as possible. You can reduce the number of germs on your skin by washing with CHG (chlorahexidine gluconate) Soap before surgery.  CHG is an antiseptic cleaner which kills germs and bonds with the skin to continue killing germs even after washing.  Please do not use if you have an allergy to CHG or antibacterial soaps. If your skin becomes reddened/irritated stop using the CHG.  Do not shave (including legs and underarms) for at least 48 hours prior to first CHG shower. It is OK to shave your face.  Please follow these instructions carefully.   1. Shower the NIGHT BEFORE SURGERY and the MORNING OF SURGERY with CHG.   2. If you chose to wash your hair, wash your hair first as usual with your normal shampoo.  3. After you shampoo, rinse your hair and body thoroughly to remove the shampoo.  4. Use CHG as you would any other liquid soap. You can apply CHG directly to the skin and wash gently with a scrungie or a clean washcloth.   5. Apply the CHG Soap to your body ONLY FROM THE NECK DOWN.  Do not use on open wounds or open sores. Avoid contact with your eyes, ears, mouth and genitals (private parts). Wash Face and genitals (private parts)  with your normal soap.  6. Wash thoroughly, paying special attention to the area where your surgery will be performed.  7. Thoroughly rinse your body with  warm water from the neck down.  8. DO NOT shower/wash with your normal soap after using and rinsing off the CHG Soap.  9. Pat yourself dry with a CLEAN TOWEL.  10. Wear CLEAN PAJAMAS to bed the night before surgery, wear comfortable clothes the morning of surgery  11. Place CLEAN SHEETS on your bed the night of your first shower and DO NOT SLEEP WITH PETS.    Day of Surgery: Do not apply any deodorants/lotions. Please wear clean clothes to the hospital/surgery center.      Please read over the following fact sheets that you were given.

## 2016-12-11 ENCOUNTER — Encounter (HOSPITAL_COMMUNITY): Payer: Self-pay

## 2016-12-11 ENCOUNTER — Encounter (HOSPITAL_COMMUNITY)
Admission: RE | Admit: 2016-12-11 | Discharge: 2016-12-11 | Disposition: A | Discharge status: Home / Self Care | Payer: Federal, State, Local not specified - PPO | Source: Ambulatory Visit | Attending: General Surgery | Admitting: General Surgery

## 2016-12-11 ENCOUNTER — Ambulatory Visit: Admission: RE | Admit: 2016-12-11 | Payer: Federal, State, Local not specified - PPO | Source: Ambulatory Visit

## 2016-12-11 DIAGNOSIS — Z01818 Encounter for other preprocedural examination: Secondary | ICD-10-CM | POA: Diagnosis not present

## 2016-12-11 HISTORY — DX: Gastro-esophageal reflux disease without esophagitis: K21.9

## 2016-12-11 HISTORY — DX: Palpitations: R00.2

## 2016-12-11 HISTORY — DX: Anxiety disorder, unspecified: F41.9

## 2016-12-11 HISTORY — DX: Personal history of other diseases of the digestive system: Z87.19

## 2016-12-11 HISTORY — DX: Unspecified osteoarthritis, unspecified site: M19.90

## 2016-12-11 HISTORY — DX: Malignant (primary) neoplasm, unspecified: C80.1

## 2016-12-11 HISTORY — DX: Cardiac murmur, unspecified: R01.1

## 2016-12-11 HISTORY — DX: Thyrotoxicosis, unspecified without thyrotoxic crisis or storm: E05.90

## 2016-12-11 LAB — CBC
HEMATOCRIT: 38.8 % (ref 36.0–46.0)
HEMOGLOBIN: 12.8 g/dL (ref 12.0–15.0)
MCH: 32 pg (ref 26.0–34.0)
MCHC: 33 g/dL (ref 30.0–36.0)
MCV: 97 fL (ref 78.0–100.0)
Platelets: 162 10*3/uL (ref 150–400)
RBC: 4 MIL/uL (ref 3.87–5.11)
RDW: 13.2 % (ref 11.5–15.5)
WBC: 5 10*3/uL (ref 4.0–10.5)

## 2016-12-11 LAB — BASIC METABOLIC PANEL
ANION GAP: 6 (ref 5–15)
BUN: 11 mg/dL (ref 6–20)
CHLORIDE: 108 mmol/L (ref 101–111)
CO2: 25 mmol/L (ref 22–32)
Calcium: 9.3 mg/dL (ref 8.9–10.3)
Creatinine, Ser: 0.82 mg/dL (ref 0.44–1.00)
GFR calc non Af Amer: >60 mL/min (ref >60)
Glucose, Bld: 125 mg/dL — ABNORMAL HIGH (ref 65–99)
POTASSIUM: 4.1 mmol/L (ref 3.5–5.1)
SODIUM: 139 mmol/L (ref 135–145)

## 2016-12-11 NOTE — Progress Notes (Signed)
PCP - Nicki Reaper Long Cardiologist - Hochrein  Chest x-ray - not needed EKG - 01/19/16 Stress Test - denies ECHO - 02/2016 Cardiac Cath - denies  Sending to anaesthesia for review of records   Patient denies shortness of breath, fever, cough and chest pain at PAT appointment   Patient verbalized understanding of instructions that were given to them at the PAT appointment. Patient was also instructed that they will need to review over the PAT instructions again at home before surgery.

## 2016-12-12 ENCOUNTER — Encounter (HOSPITAL_COMMUNITY): Payer: Self-pay

## 2016-12-12 DIAGNOSIS — E0500 Thyrotoxicosis with diffuse goiter without thyrotoxic crisis or storm: Secondary | ICD-10-CM | POA: Diagnosis not present

## 2016-12-12 NOTE — Progress Notes (Signed)
Anesthesia Chart Review:  Pt is a 61 year old female scheduled for R breast lumpectomy with radioactive seed and sentinel lymph node biopsy on 12/16/2016 with Rolm Bookbinder, MD  - PCP is Shanon Rosser, [place] - Cardiologist is Minus Breeding, MD. Last office visit 01/19/16  PMH includes:  HTN, palpitations, hyperlipidemia, heart murmur (2/6 left upper sternal border systolic murmur per Dr. Percival Spanish), PE, DVT (s/p IVC filter; 1 strut adjacent to bowel, another strut adjacent to vertebral body by CT 08/01/15 in care everywhere, asymptomatic), hyperthyroidism, iron deficiency anemia, breast cancer, GERD. Former smoker. BMI 29.5  Medications include: ASA 81mg , lipitor, candesartan, methimazole  BP (!) 143/84   Pulse 70   Temp 36.7 C   Resp 20   Ht 5\' 6"  (1.676 m)   Wt 182 lb 3.2 oz (82.6 kg)   SpO2 100%   BMI 29.41 kg/m    Preoperative labs reviewed.    EKG 01/19/16: NSR. Nonspecific T wave abnormality.   Echo 02/21/16:  - Left ventricle: The cavity size was normal. Wall thickness was normal. Systolic function was normal. The estimated ejection fraction was in the range of 60% to 65%. Wall motion was normal; there were no regional wall motion abnormalities. Doppler parameters are consistent with abnormal left ventricular relaxation (grade 1 diastolic dysfunction). - Pulmonary arteries: Systolic pressure was mildly increased. PA peak pressure: 31 mm Hg (S).  If no changes, I anticipate pt can proceed with surgery as scheduled.   Willeen Cass, FNP-BC [facility]/Anesthesiology Phone: [phone] 12/12/2016 10:42 AM

## 2016-12-13 ENCOUNTER — Ambulatory Visit
Admission: RE | Admit: 2016-12-13 | Discharge: 2016-12-13 | Disposition: A | Discharge status: Home / Self Care | Payer: Federal, State, Local not specified - PPO | Source: Ambulatory Visit | Attending: General Surgery | Admitting: General Surgery

## 2016-12-13 DIAGNOSIS — C50511 Malignant neoplasm of lower-outer quadrant of right female breast: Secondary | ICD-10-CM | POA: Diagnosis not present

## 2016-12-13 DIAGNOSIS — Z170 Estrogen receptor positive status [ER+]: Principal | ICD-10-CM

## 2016-12-16 ENCOUNTER — Ambulatory Visit (HOSPITAL_COMMUNITY): Payer: Federal, State, Local not specified - PPO | Admitting: Anesthesiology

## 2016-12-16 ENCOUNTER — Ambulatory Visit (HOSPITAL_COMMUNITY)
Admission: RE | Admit: 2016-12-16 | Discharge: 2016-12-16 | Disposition: A | Discharge status: Home / Self Care | Payer: Federal, State, Local not specified - PPO | Source: Ambulatory Visit | Attending: General Surgery | Admitting: General Surgery

## 2016-12-16 ENCOUNTER — Encounter (HOSPITAL_COMMUNITY): Payer: Self-pay

## 2016-12-16 ENCOUNTER — Ambulatory Visit
Admission: RE | Admit: 2016-12-16 | Discharge: 2016-12-16 | Disposition: A | Discharge status: Home / Self Care | Payer: Federal, State, Local not specified - PPO | Source: Ambulatory Visit | Attending: General Surgery | Admitting: General Surgery

## 2016-12-16 ENCOUNTER — Ambulatory Visit (HOSPITAL_COMMUNITY): Payer: Federal, State, Local not specified - PPO | Admitting: Emergency Medicine

## 2016-12-16 ENCOUNTER — Encounter (HOSPITAL_COMMUNITY)
Admission: RE | Disposition: A | Discharge status: Home / Self Care | Payer: Self-pay | Source: Ambulatory Visit | Attending: General Surgery

## 2016-12-16 DIAGNOSIS — K219 Gastro-esophageal reflux disease without esophagitis: Secondary | ICD-10-CM | POA: Insufficient documentation

## 2016-12-16 DIAGNOSIS — Z86711 Personal history of pulmonary embolism: Secondary | ICD-10-CM | POA: Diagnosis not present

## 2016-12-16 DIAGNOSIS — E7800 Pure hypercholesterolemia, unspecified: Secondary | ICD-10-CM | POA: Diagnosis not present

## 2016-12-16 DIAGNOSIS — Z87891 Personal history of nicotine dependence: Secondary | ICD-10-CM | POA: Diagnosis not present

## 2016-12-16 DIAGNOSIS — Z7982 Long term (current) use of aspirin: Secondary | ICD-10-CM | POA: Insufficient documentation

## 2016-12-16 DIAGNOSIS — E785 Hyperlipidemia, unspecified: Secondary | ICD-10-CM | POA: Diagnosis not present

## 2016-12-16 DIAGNOSIS — C50511 Malignant neoplasm of lower-outer quadrant of right female breast: Secondary | ICD-10-CM

## 2016-12-16 DIAGNOSIS — C50911 Malignant neoplasm of unspecified site of right female breast: Secondary | ICD-10-CM | POA: Diagnosis not present

## 2016-12-16 DIAGNOSIS — Z170 Estrogen receptor positive status [ER+]: Principal | ICD-10-CM

## 2016-12-16 DIAGNOSIS — R928 Other abnormal and inconclusive findings on diagnostic imaging of breast: Secondary | ICD-10-CM | POA: Diagnosis not present

## 2016-12-16 DIAGNOSIS — Z79899 Other long term (current) drug therapy: Secondary | ICD-10-CM | POA: Diagnosis not present

## 2016-12-16 DIAGNOSIS — N6011 Diffuse cystic mastopathy of right breast: Secondary | ICD-10-CM | POA: Diagnosis not present

## 2016-12-16 DIAGNOSIS — M1990 Unspecified osteoarthritis, unspecified site: Secondary | ICD-10-CM | POA: Insufficient documentation

## 2016-12-16 DIAGNOSIS — I10 Essential (primary) hypertension: Secondary | ICD-10-CM | POA: Insufficient documentation

## 2016-12-16 DIAGNOSIS — G8918 Other acute postprocedural pain: Secondary | ICD-10-CM | POA: Diagnosis not present

## 2016-12-16 HISTORY — PX: BREAST LUMPECTOMY: SHX2

## 2016-12-16 HISTORY — PX: BREAST LUMPECTOMY WITH RADIOACTIVE SEED AND SENTINEL LYMPH NODE BIOPSY: SHX6550

## 2016-12-16 SURGERY — BREAST LUMPECTOMY WITH RADIOACTIVE SEED AND SENTINEL LYMPH NODE BIOPSY
Anesthesia: General | Site: Breast | Laterality: Right

## 2016-12-16 MED ORDER — MIDAZOLAM HCL 2 MG/2ML IJ SOLN
INTRAMUSCULAR | Status: AC
  Filled 2016-12-16: qty 2

## 2016-12-16 MED ORDER — PROPOFOL 10 MG/ML IV BOLUS
INTRAVENOUS | Status: AC
  Filled 2016-12-16: qty 20

## 2016-12-16 MED ORDER — ROCURONIUM BROMIDE 10 MG/ML (PF) SYRINGE
PREFILLED_SYRINGE | INTRAVENOUS | Status: AC
  Filled 2016-12-16: qty 5

## 2016-12-16 MED ORDER — BUPIVACAINE-EPINEPHRINE 0.25% -1:200000 IJ SOLN
INTRAMUSCULAR | Status: DC | PRN
  Administered 2016-12-16: 12 mL

## 2016-12-16 MED ORDER — ONDANSETRON HCL 4 MG/2ML IJ SOLN
INTRAMUSCULAR | Status: AC
  Filled 2016-12-16: qty 2

## 2016-12-16 MED ORDER — FENTANYL CITRATE (PF) 100 MCG/2ML IJ SOLN
INTRAMUSCULAR | Status: AC
  Administered 2016-12-16: 100 ug
  Filled 2016-12-16: qty 2

## 2016-12-16 MED ORDER — TECHNETIUM TC 99M SULFUR COLLOID FILTERED: 1.0000 | Freq: Once | INTRAVENOUS | Status: DC | PRN

## 2016-12-16 MED ORDER — FENTANYL CITRATE (PF) 100 MCG/2ML IJ SOLN
INTRAMUSCULAR | Status: DC | PRN
  Administered 2016-12-16 (×2): 50 ug via INTRAVENOUS

## 2016-12-16 MED ORDER — 0.9 % SODIUM CHLORIDE (POUR BTL) OPTIME
TOPICAL | Status: DC | PRN
  Administered 2016-12-16: 1000 mL

## 2016-12-16 MED ORDER — MIDAZOLAM HCL 2 MG/2ML IJ SOLN
INTRAMUSCULAR | Status: AC
  Administered 2016-12-16: 2 mg
  Filled 2016-12-16: qty 2

## 2016-12-16 MED ORDER — BUPIVACAINE-EPINEPHRINE (PF) 0.5% -1:200000 IJ SOLN
INTRAMUSCULAR | Status: DC | PRN
  Administered 2016-12-16: 30 mL

## 2016-12-16 MED ORDER — CIPROFLOXACIN IN D5W 400 MG/200ML IV SOLN
400.0000 mg | INTRAVENOUS | Status: AC
  Administered 2016-12-16: 400 mg via INTRAVENOUS
  Filled 2016-12-16: qty 200

## 2016-12-16 MED ORDER — HYDROMORPHONE HCL 1 MG/ML IJ SOLN
INTRAMUSCULAR | Status: AC
  Filled 2016-12-16: qty 1

## 2016-12-16 MED ORDER — ONDANSETRON HCL 4 MG/2ML IJ SOLN
INTRAMUSCULAR | Status: DC | PRN
  Administered 2016-12-16: 4 mg via INTRAVENOUS

## 2016-12-16 MED ORDER — MIDAZOLAM HCL 2 MG/2ML IJ SOLN
2.0000 mg | Freq: Once | INTRAMUSCULAR | Status: DC
  Filled 2016-12-16: qty 2

## 2016-12-16 MED ORDER — ONDANSETRON HCL 4 MG/2ML IJ SOLN: 4.0000 mg | Freq: Once | INTRAMUSCULAR | Status: DC | PRN

## 2016-12-16 MED ORDER — HYDROMORPHONE HCL 1 MG/ML IJ SOLN
0.2500 mg | INTRAMUSCULAR | Status: DC | PRN
  Administered 2016-12-16: 0.5 mg via INTRAVENOUS

## 2016-12-16 MED ORDER — OXYCODONE HCL 5 MG PO TABS: 5.0000 mg | ORAL_TABLET | Freq: Four times a day (QID) | ORAL | 0 refills | Status: DC | PRN

## 2016-12-16 MED ORDER — DEXAMETHASONE SODIUM PHOSPHATE 10 MG/ML IJ SOLN
INTRAMUSCULAR | Status: AC
  Filled 2016-12-16: qty 1

## 2016-12-16 MED ORDER — SUCCINYLCHOLINE CHLORIDE 200 MG/10ML IV SOSY
PREFILLED_SYRINGE | INTRAVENOUS | Status: AC
  Filled 2016-12-16: qty 10

## 2016-12-16 MED ORDER — MEPERIDINE HCL 25 MG/ML IJ SOLN: 6.2500 mg | INTRAMUSCULAR | Status: DC | PRN

## 2016-12-16 MED ORDER — LIDOCAINE 2% (20 MG/ML) 5 ML SYRINGE
INTRAMUSCULAR | Status: AC
  Filled 2016-12-16: qty 5

## 2016-12-16 MED ORDER — PHENYLEPHRINE 40 MCG/ML (10ML) SYRINGE FOR IV PUSH (FOR BLOOD PRESSURE SUPPORT)
PREFILLED_SYRINGE | INTRAVENOUS | Status: AC
  Filled 2016-12-16: qty 10

## 2016-12-16 MED ORDER — PHENYLEPHRINE HCL 10 MG/ML IJ SOLN
INTRAMUSCULAR | Status: DC | PRN
  Administered 2016-12-16: 40 ug via INTRAVENOUS
  Administered 2016-12-16 (×4): 80 ug via INTRAVENOUS
  Administered 2016-12-16: 40 ug via INTRAVENOUS

## 2016-12-16 MED ORDER — PROPOFOL 10 MG/ML IV BOLUS
INTRAVENOUS | Status: DC | PRN
  Administered 2016-12-16: 150 mg via INTRAVENOUS
  Administered 2016-12-16: 120 mg via INTRAVENOUS
  Administered 2016-12-16: 50 mg via INTRAVENOUS

## 2016-12-16 MED ORDER — GABAPENTIN 300 MG PO CAPS
300.0000 mg | ORAL_CAPSULE | ORAL | Status: AC
  Administered 2016-12-16: 300 mg via ORAL
  Filled 2016-12-16: qty 1

## 2016-12-16 MED ORDER — HEMOSTATIC AGENTS (NO CHARGE) OPTIME
TOPICAL | Status: DC | PRN
  Administered 2016-12-16: 1 via TOPICAL

## 2016-12-16 MED ORDER — LIDOCAINE HCL (CARDIAC) 20 MG/ML IV SOLN
INTRAVENOUS | Status: DC | PRN
  Administered 2016-12-16: 100 mg via INTRAVENOUS

## 2016-12-16 MED ORDER — ACETAMINOPHEN 500 MG PO TABS
1000.0000 mg | ORAL_TABLET | ORAL | Status: AC
  Administered 2016-12-16: 1000 mg via ORAL
  Filled 2016-12-16: qty 2

## 2016-12-16 MED ORDER — SUCCINYLCHOLINE CHLORIDE 20 MG/ML IJ SOLN
INTRAMUSCULAR | Status: DC | PRN
  Administered 2016-12-16: 100 mg via INTRAVENOUS

## 2016-12-16 MED ORDER — FENTANYL CITRATE (PF) 100 MCG/2ML IJ SOLN
100.0000 ug | Freq: Once | INTRAMUSCULAR | Status: DC
  Filled 2016-12-16: qty 2

## 2016-12-16 MED ORDER — LACTATED RINGERS IV SOLN
INTRAVENOUS | Status: DC | PRN
  Administered 2016-12-16: 08:00:00 via INTRAVENOUS

## 2016-12-16 MED ORDER — HEPARIN SODIUM (PORCINE) 5000 UNIT/ML IJ SOLN
5000.0000 [IU] | Freq: Once | INTRAMUSCULAR | Status: AC
  Administered 2016-12-16: 5000 [IU] via SUBCUTANEOUS
  Filled 2016-12-16: qty 1

## 2016-12-16 MED ORDER — FENTANYL CITRATE (PF) 250 MCG/5ML IJ SOLN
INTRAMUSCULAR | Status: AC
  Filled 2016-12-16: qty 5

## 2016-12-16 SURGICAL SUPPLY — 51 items
APPLIER CLIP 9.375 MED OPEN (MISCELLANEOUS) ×2
BINDER BREAST LRG (GAUZE/BANDAGES/DRESSINGS) ×2 IMPLANT
BINDER BREAST XLRG (GAUZE/BANDAGES/DRESSINGS) IMPLANT
BLADE SURG 15 STRL LF DISP TIS (BLADE) ×1 IMPLANT
BLADE SURG 15 STRL SS (BLADE) ×1
CANISTER SUCT 3000ML PPV (MISCELLANEOUS) ×2 IMPLANT
CHLORAPREP W/TINT 26ML (MISCELLANEOUS) ×2 IMPLANT
CLIP APPLIE 9.375 MED OPEN (MISCELLANEOUS) ×1 IMPLANT
COVER PROBE W GEL 5X96 (DRAPES) ×2 IMPLANT
COVER SURGICAL LIGHT HANDLE (MISCELLANEOUS) ×2 IMPLANT
DERMABOND ADVANCED (GAUZE/BANDAGES/DRESSINGS) ×1
DERMABOND ADVANCED .7 DNX12 (GAUZE/BANDAGES/DRESSINGS) ×1 IMPLANT
DEVICE DUBIN SPECIMEN MAMMOGRA (MISCELLANEOUS) ×2 IMPLANT
DRAPE CHEST BREAST 15X10 FENES (DRAPES) ×2 IMPLANT
DRAPE UTILITY XL STRL (DRAPES) ×2 IMPLANT
ELECT COATED BLADE 2.86 ST (ELECTRODE) ×2 IMPLANT
ELECT REM PT RETURN 9FT ADLT (ELECTROSURGICAL) ×2
ELECTRODE REM PT RTRN 9FT ADLT (ELECTROSURGICAL) ×1 IMPLANT
GLOVE BIO SURGEON STRL SZ7 (GLOVE) ×2 IMPLANT
GLOVE BIOGEL PI IND STRL 7.5 (GLOVE) ×1 IMPLANT
GLOVE BIOGEL PI INDICATOR 7.5 (GLOVE) ×1
GOWN STRL REUS W/ TWL LRG LVL3 (GOWN DISPOSABLE) ×2 IMPLANT
GOWN STRL REUS W/TWL LRG LVL3 (GOWN DISPOSABLE) ×2
HEMOSTAT ARISTA ABSORB 3G PWDR (MISCELLANEOUS) ×2 IMPLANT
ILLUMINATOR WAVEGUIDE N/F (MISCELLANEOUS) ×2 IMPLANT
KIT BASIN OR (CUSTOM PROCEDURE TRAY) ×2 IMPLANT
KIT MARKER MARGIN INK (KITS) ×2 IMPLANT
MARKER SKIN DUAL TIP RULER LAB (MISCELLANEOUS) ×2 IMPLANT
NDL SAFETY ECLIPSE 18X1.5 (NEEDLE) IMPLANT
NEEDLE FILTER BLUNT 18X 1/2SAF (NEEDLE)
NEEDLE FILTER BLUNT 18X1 1/2 (NEEDLE) IMPLANT
NEEDLE HYPO 18GX1.5 SHARP (NEEDLE)
NEEDLE HYPO 25GX1X1/2 BEV (NEEDLE) ×2 IMPLANT
NS IRRIG 1000ML POUR BTL (IV SOLUTION) ×2 IMPLANT
PACK SURGICAL SETUP 50X90 (CUSTOM PROCEDURE TRAY) ×2 IMPLANT
PENCIL BUTTON HOLSTER BLD 10FT (ELECTRODE) ×2 IMPLANT
SPONGE LAP 18X18 X RAY DECT (DISPOSABLE) ×2 IMPLANT
STRIP CLOSURE SKIN 1/2X4 (GAUZE/BANDAGES/DRESSINGS) ×2 IMPLANT
SUT MNCRL AB 4-0 PS2 18 (SUTURE) ×4 IMPLANT
SUT MON AB 5-0 PS2 18 (SUTURE) ×2 IMPLANT
SUT SILK 2 0 SH (SUTURE) IMPLANT
SUT VIC AB 2-0 SH 27 (SUTURE) ×2
SUT VIC AB 2-0 SH 27XBRD (SUTURE) ×2 IMPLANT
SUT VIC AB 3-0 SH 27 (SUTURE) ×2
SUT VIC AB 3-0 SH 27X BRD (SUTURE) ×2 IMPLANT
SYR BULB 3OZ (MISCELLANEOUS) ×2 IMPLANT
SYR CONTROL 10ML LL (SYRINGE) ×2 IMPLANT
TOWEL OR 17X24 6PK STRL BLUE (TOWEL DISPOSABLE) ×2 IMPLANT
TOWEL OR 17X26 10 PK STRL BLUE (TOWEL DISPOSABLE) ×2 IMPLANT
TUBE CONNECTING 12X1/4 (SUCTIONS) ×2 IMPLANT
YANKAUER SUCT BULB TIP NO VENT (SUCTIONS) ×2 IMPLANT

## 2016-12-16 NOTE — Interval H&P Note (Signed)
History and Physical Interval Note:  12/16/2016 11:00 AM  [name]  has presented today for surgery, with the diagnosis of RIGHT BREAST CANCER  The various methods of treatment have been discussed with the patient and family. After consideration of risks, benefits and other options for treatment, the patient has consented to  Procedure(s): RIGHT BREAST LUMPECTOMY WITH RADIOACTIVE SEED AND SENTINEL LYMPH NODE BIOPSY (Right) as a surgical intervention .  The patient's history has been reviewed, patient examined, no change in status, stable for surgery.  I have reviewed the patient's chart and labs.  Questions were answered to the patient's satisfaction.     Eryck Negron

## 2016-12-16 NOTE — Transfer of Care (Signed)
Immediate Anesthesia Transfer of Care Note  Patient: [name]  Procedure(s) Performed: RIGHT BREAST LUMPECTOMY WITH RADIOACTIVE SEED AND SENTINEL LYMPH NODE BIOPSY (Right Breast)  Patient Location: PACU  Anesthesia Type:General  Level of Consciousness: awake, oriented and patient cooperative  Airway & Oxygen Therapy: Patient Spontanous Breathing and Patient connected to nasal cannula oxygen  Post-op Assessment: Report given to RN and Post -op Vital signs reviewed and stable  Post vital signs: Reviewed  Last Vitals:  Vitals:   12/16/16 0846  BP: 140/77  Pulse: 84  Resp: 18  Temp: 36.8 C  SpO2: 100%    Last Pain:  Vitals:   12/16/16 0846  TempSrc: Oral      Patients Stated Pain Goal: 3 (62/03/55 9741)  Complications: No apparent anesthesia complications

## 2016-12-16 NOTE — Anesthesia Procedure Notes (Signed)
Procedure Name: LMA Insertion Date/Time: 12/16/2016 9:47 AM Performed by: Jenne Campus, CRNA Pre-anesthesia Checklist: Patient identified, Emergency Drugs available, Suction available and Patient being monitored Patient Re-evaluated:Patient Re-evaluated prior to induction Oxygen Delivery Method: Circle System Utilized Preoxygenation: Pre-oxygenation with 100% oxygen Induction Type: IV induction Ventilation: Mask ventilation without difficulty LMA: LMA inserted LMA Size: 4.0 Number of attempts: 1 Placement Confirmation: positive ETCO2 and breath sounds checked- equal and bilateral Tube secured with: Tape Dental Injury: Teeth and Oropharynx as per pre-operative assessment

## 2016-12-16 NOTE — H&P (Signed)
69 yof referred by Dr Jeanmarie Plant for newly diagnosed right breast cancer. she has 2nd cousin with breast cancer. she has no prior breast history. she does have prior history of dvt in 2010 and was started on coumadin. she then had a pe thought to be possibly due to coumadin failure but may have just been iliac dvt. she had filter placed and was on lovenox after that. she underwent full hypercoagulable workup at that time and was all negative. now off anticoagulation. she underwent screening mm that shows right breast calcs/distortion. no real measurement but appears small. has c density breasts. axillary [place] is negative. underwent stereo biopsy that shows grade II IDC that is er pos at 95, pr pos at 5, her 2 negative and Ki is 10%. she has no mass or dc. she is here to discuss options with her husband, sister and niece   Past Surgical History Tawni Pummel, RN; 11/20/2016 7:28 AM) Breast Biopsy  Right. Gallbladder Surgery - Laparoscopic  Oral Surgery   Diagnostic Studies History Tawni Pummel, RN; 11/20/2016 7:28 AM) Colonoscopy  1-5 years ago Mammogram  within last year Pap Smear  1-5 years ago  Medication History Tawni Pummel, RN; 11/20/2016 7:29 AM) Medications Reconciled  Social History Tawni Pummel, RN; 11/20/2016 7:28 AM) Alcohol use  Occasional alcohol use. Caffeine use  Coffee, Tea. No drug use  Tobacco use  Former smoker.  Family History Tawni Pummel, RN; 11/20/2016 7:28 AM) Alcohol Abuse  Family Members In General. Arthritis  Mother. Breast Cancer  Family Members In General. Cancer  Family Members In General. Cerebrovascular Accident  Family Members In [place] Members In General. Depression  Family Members In General, Mother. Diabetes Mellitus  Family Members In General. Heart Disease  Brother. Hypertension  Family Members In General, Mother. Malignant Neoplasm Of Pancreas  Family Members In  General. Prostate Cancer  Family Members In General. Respiratory Condition  Family Members In General. Thyroid problems  Family Members In General.  Pregnancy / Birth History Tawni Pummel, RN; 11/20/2016 7:28 AM) Age at menarche  53 years. Age of menopause  85-62 Contraceptive History  Oral contraceptives. Gravida  2 Irregular periods  Para  0  Other Problems Tawni Pummel, RN; 11/20/2016 7:28 AM) Anxiety Disorder  Arthritis  Back Pain  Bladder Problems  Breast Cancer  Cholelithiasis  Gastroesophageal Reflux Disease  Heart murmur  High blood pressure  Hypercholesterolemia  Lump In Breast  Migraine Headache  Pulmonary Embolism / Blood Clot in Legs  Thyroid Disease     Review of Systems Sunday Spillers Ledford RN; 11/20/2016 7:28 AM) General Present- Appetite Loss. Not Present- Chills, Fatigue, Fever, Night Sweats, Weight Gain and Weight Loss. Skin Present- Dryness. Not Present- Change in Wart/Mole, Hives, Jaundice, New Lesions, Non-Healing Wounds, Rash and Ulcer. HEENT Present- Seasonal Allergies and Wears glasses/contact lenses. Not Present- Earache, Hearing Loss, Hoarseness, Nose Bleed, Oral Ulcers, Ringing in the Ears, Sinus Pain, Sore Throat, Visual Disturbances and Yellow Eyes. Respiratory Present- Snoring. Not Present- Bloody sputum, Chronic Cough, Difficulty Breathing and Wheezing. Breast Present- Breast Pain and Skin Changes. Not Present- Breast Mass and Nipple Discharge. Cardiovascular Present- Palpitations and Rapid Heart Rate. Not Present- Chest Pain, Difficulty Breathing Lying Down, Leg Cramps, Shortness of Breath and Swelling of Extremities. Gastrointestinal Not Present- Abdominal Pain, Bloating, Bloody Stool, Change in Bowel Habits, Chronic diarrhea, Constipation, Difficulty Swallowing, Excessive gas, Gets full quickly at meals, Hemorrhoids, Indigestion, Nausea, Rectal Pain and Vomiting. Female Genitourinary Not Present- Frequency, Nocturia,  Painful Urination,  Pelvic Pain and Urgency. Musculoskeletal Present- Back Pain, Joint Pain and Joint Stiffness. Not Present- Muscle Pain, Muscle Weakness and Swelling of Extremities. Neurological Not Present- Decreased Memory, Fainting, Headaches, Numbness, Seizures, Tingling, Tremor, Trouble walking and Weakness. Psychiatric Present- Anxiety. Not Present- Bipolar, Change in Sleep Pattern, Depression, Fearful and Frequent crying. Endocrine Present- Hair Changes. Not Present- Cold Intolerance, Excessive Hunger, Heat Intolerance, Hot flashes and New Diabetes. Hematology Not Present- Blood Thinners, Easy Bruising, Excessive bleeding, Gland problems, HIV and Persistent Infections.   Physical Exam Rolm Bookbinder MD; 11/20/2016 9:23 PM) General Mental Status-Alert. Orientation-Oriented X3.  Head and Neck Trachea-midline. Thyroid Gland Characteristics - normal size and consistency.  Eye Sclera/Conjunctiva - Bilateral-No scleral icterus.  Chest and Lung Exam Chest and lung exam reveals -quiet, even and easy respiratory effort with no use of accessory muscles and on auscultation, normal breath sounds, no adventitious sounds and normal vocal resonance.  Breast Nipples-No Discharge. Breast Lump-No Palpable Breast Mass.  Cardiovascular Cardiovascular examination reveals -normal heart sounds, regular rate and rhythm with no murmurs.  Lymphatic Head & Neck  General Head & Neck Lymphatics: Bilateral - Description - Normal. Axillary  General Axillary Region: Bilateral - Description - Normal. Note: no [place] adenopathy     Assessment & Plan Rolm Bookbinder MD; 11/20/2016 9:28 PM) BREAST CANCER OF LOWER-OUTER QUADRANT OF RIGHT FEMALE BREAST (C50.511) Story: Right breast seed guided lumpectomy, right axillary sentinel node biopsy I discussed with Dr Jana Hakim and we will give sq heparin prior to surgery and then prophylactic lovenox daily for 3-7 days until walking  normally. We discussed the staging and pathophysiology of breast cancer. We discussed all of the different options for treatment for breast cancer including surgery, chemotherapy, radiation therapy, Herceptin, and antiestrogen therapy. We discussed a sentinel lymph node biopsy as she does not appear to having lymph node involvement right now. We discussed the performance of that with injection of radioactive tracer. We discussed that there is a chance of having a positive node with a sentinel lymph node biopsy and we will await the permanent pathology to make any other first further decisions in terms of her treatment. We discussed up to a 5% risk lifetime of chronic shoulder pain as well as lymphedema associated with a sentinel lymph node biopsy. We discussed the options for treatment of the breast cancer which included lumpectomy versus a mastectomy. We discussed the performance of the lumpectomy with radioactive seed placement. We discussed a 5-10% chance of a positive margin requiring reexcision in the operating room. We also discussed that she will need radiation therapy if she undergoes lumpectomy. We discussed mastectomy and the postoperative care for that as well. Mastectomy can be followed by reconstruction. The decision for lumpectomy vs mastectomy has no impact on decision for chemotherapy. Most mastectomy patients will not need radiation therapy. We discussed that there is no difference in her survival whether she undergoes lumpectomy with radiation therapy or antiestrogen therapy versus a mastectomy. There is also no real difference between her recurrence in the breast. We discussed the risks of operation including bleeding, infection, possible reoperation. She understands her further therapy will be based on what her stage is at surgery

## 2016-12-16 NOTE — Anesthesia Procedure Notes (Signed)
Procedure Name: Intubation Date/Time: 12/16/2016 9:50 AM Performed by: Jenne Campus, CRNA Pre-anesthesia Checklist: Patient identified, Emergency Drugs available, Suction available and Patient being monitored Patient Re-evaluated:Patient Re-evaluated prior to induction Oxygen Delivery Method: Circle System Utilized Preoxygenation: Pre-oxygenation with 100% oxygen Induction Type: IV induction Ventilation: Mask ventilation without difficulty Laryngoscope Size: Miller and 2 Grade View: Grade I Tube type: Oral Tube size: 7.0 mm Number of attempts: 1 Airway Equipment and Method: Stylet and Oral airway Placement Confirmation: ETT inserted through vocal cords under direct vision,  positive ETCO2 and breath sounds checked- equal and bilateral Tube secured with: Tape Dental Injury: Teeth and Oropharynx as per pre-operative assessment

## 2016-12-16 NOTE — Anesthesia Procedure Notes (Signed)
Anesthesia Regional Block: Pectoralis block   Pre-Anesthetic Checklist: ,, timeout performed, Correct Patient, Correct Site, Correct Laterality, Correct Procedure, Correct Position, site marked, Risks and benefits discussed,  Surgical consent,  Pre-op evaluation,  At surgeon's request and post-op pain management  Laterality: Right  Prep: chloraprep       Needles:  Injection technique: Single-shot     Needle Length: 9cm  Needle Gauge: 21     Additional Needles:   Narrative:  Start time: 12/16/2016 8:50 AM End time: 12/16/2016 9:00 AM Injection made incrementally with aspirations every 5 mL.  Performed by: Personally  Anesthesiologist: Lillia Abed, MD  Additional Notes: Monitors applied. Patient sedated. Sterile prep and drape,hand hygiene and sterile gloves were used. Relevant anatomy identified.Needle position confirmed.Local anesthetic injected incrementally after negative aspiration. Local anesthetic spread visualized. Vascular puncture avoided. No complications. Image printed for medical record.The patient tolerated the procedure well.

## 2016-12-16 NOTE — Anesthesia Preprocedure Evaluation (Addendum)
Anesthesia Evaluation  Patient identified by MRN, date of birth, ID band Patient awake    Reviewed: Allergy & Precautions, NPO status , Patient's Chart, lab work & pertinent test results  Airway Mallampati: I  TM Distance: >3 FB Neck ROM: Full    Dental  (+) Teeth Intact, Dental Advisory Given   Pulmonary former smoker,    Pulmonary exam normal breath sounds clear to auscultation       Cardiovascular hypertension, Pt. on medications Normal cardiovascular exam Rhythm:Regular Rate:Normal     Neuro/Psych    GI/Hepatic GERD  Medicated and Controlled,  Endo/Other    Renal/GU      Musculoskeletal   Abdominal   Peds  Hematology   Anesthesia Other Findings   Reproductive/Obstetrics                            Anesthesia Physical Anesthesia Plan  ASA: II  Anesthesia Plan: General   Post-op Pain Management:  Regional for Post-op pain   Induction: Intravenous  PONV Risk Score and Plan: 3 and Ondansetron, Dexamethasone and Midazolam  Airway Management Planned: LMA and Oral ETT  Additional Equipment:   Intra-op Plan:   Post-operative Plan: Extubation in OR  Informed Consent: I have reviewed the patients History and Physical, chart, labs and discussed the procedure including the risks, benefits and alternatives for the proposed anesthesia with the patient or authorized representative who has indicated his/her understanding and acceptance.     Plan Discussed with: CRNA and Surgeon  Anesthesia Plan Comments:        Anesthesia Quick Evaluation

## 2016-12-16 NOTE — Discharge Instructions (Signed)
[facility] °Office Phone Number [phone] °POST OP INSTRUCTIONS ° °Always review your discharge instruction sheet given to you by the facility where your surgery was performed. ° °IF YOU HAVE DISABILITY OR FAMILY LEAVE FORMS, YOU MUST BRING THEM TO THE OFFICE FOR PROCESSING.  DO NOT GIVE THEM TO YOUR DOCTOR. ° °1. A prescription for pain medication may be given to you upon discharge.  Take your pain medication as prescribed, if needed.  If narcotic pain medicine is not needed, then you may take acetaminophen (Tylenol), naprosyn (Alleve) or ibuprofen (Advil) as needed. °2. Take your usually prescribed medications unless otherwise directed °3. If you need a refill on your pain medication, please contact your pharmacy.  They will contact our office to request authorization.  Prescriptions will not be filled after 5pm or on week-ends. °4. You should eat very light the first 24 hours after surgery, such as soup, crackers, pudding, etc.  Resume your normal diet the day after surgery. °5. Most patients will experience some swelling and bruising in the breast.  Ice packs and a good support bra will help.  Wear the breast binder provided or a sports bra for 72 hours day and night.  After that wear a sports bra during the day until you return to the office. Swelling and bruising can take several days to resolve.  °6. It is common to experience some constipation if taking pain medication after surgery.  Increasing fluid intake and taking a stool softener will usually help or prevent this problem from occurring.  A mild laxative (Milk of Magnesia or Miralax) should be taken according to package directions if there are no bowel movements after 48 hours. °7. Unless discharge instructions indicate otherwise, you may remove your bandages 48 hours after surgery and you may shower at that time.  You may have steri-strips (small skin tapes) in place directly over the incision.  These strips should be left on the  skin for 7-10 days and will come off on their own.  If your surgeon used skin glue on the incision, you may shower in 24 hours.  The glue will flake off over the next 2-3 weeks.  Any sutures or staples will be removed at the office during your follow-up visit. °8. ACTIVITIES:  You may resume regular daily activities (gradually increasing) beginning the next day.  Wearing a good support bra or sports bra minimizes pain and swelling.  You may have sexual intercourse when it is comfortable. °a. You may drive when you no longer are taking prescription pain medication, you can comfortably wear a seatbelt, and you can safely maneuver your car and apply brakes. °b. RETURN TO WORK:  ______________________________________________________________________________________ °9. You should see your doctor in the office for a follow-up appointment approximately two weeks after your surgery.  Your doctor’s nurse will typically make your follow-up appointment when she calls you with your pathology report.  Expect your pathology report 3-4 business days after your surgery.  You may call to check if you do not hear from us after three days. °10. OTHER INSTRUCTIONS: _______________________________________________________________________________________________ _____________________________________________________________________________________________________________________________________ °_____________________________________________________________________________________________________________________________________ °_____________________________________________________________________________________________________________________________________ ° °WHEN TO CALL DR Jaxie Racanelli: °1. Fever over 101.0 °2. Nausea and/or vomiting. °3. Extreme swelling or bruising. °4. Continued bleeding from incision. °5. Increased pain, redness, or drainage from the incision. ° °The clinic staff is available to answer your questions during regular  business hours.  Please don’t hesitate to call and ask to speak to one of the nurses for clinical concerns.  If you   have a medical emergency, go to the nearest emergency room or call 911.  A surgeon from [facility] is always on call at the hospital. ° °For further questions, please visit [email or web address] ° °

## 2016-12-16 NOTE — Op Note (Addendum)
Preoperative diagnosis: Right breast cancer, clinical stage I Postoperative diagnosis: same as above Procedure: Rightbreast seed guided lumpectomy Right deep axillary sentinel node biopsy Surgeon: Dr Serita Grammes EBL: minimal Anes: general  Specimens  1. rightbreast tissue marked with paint 2. Right axillary sentinel nodes with highest count 3704 3. Additional right breast inferior margin marked short superior long lateral double deep Complications none Drains none Sponge count correct Dispo to pacu stable  Indications: This is a 40 yof with a newly diagnosed clinical stage I right breast cancer.  We discussed options and have elected to proceed with seed guided lumpectomy and sentinel node biopsy.    Procedure: After informed consent was obtained the patient was taken to the operating room. She first was given technetium in standard periareolar fashion. She had a pectoral block. She was given antibiotics. Sequential compression devices were on her legs. She was then placed under general anesthesia with an LMA. Then she was prepped and draped in the standard sterile surgical fashion. Surgical timeout was then performed.  I then located the seed in the lower outer right breast I infiltrated marcaine in the skin and then made a periareolar incision in an attempt to hide the scar later.I then used the neoprobe to remove the seed and the surrounding tissue with attempt to get clear margins. I marked this with paint. MM confirmed removal of seed and the clip.I placed clips in the cavity.I then obtained hemostasis. I did remove some more inferior margin as the seed counts were higher there. This was marked as above.  I approximated the breast tissue with 2-0 vicryl. The skin was closed with 3-0 vicryl and 5-0 monocryl. Glue and steristrips were applied. I then infiltrated marcaine in the low axilla and made an incision below the hairline. I carried this through the axillary fascia. I  excised all radioactive nodes.  There were no enlarged nodes. The background radioactivity was zero. I then obtained hemostasis. I closed the axillary fascia with 2-0 vicryl. The skin was closed with 3-0 vicryl and 4-0 monocryl. Glue and steristrips were applied. A binder was placed. She was extubated and transferred to PACU.

## 2016-12-17 ENCOUNTER — Encounter (HOSPITAL_COMMUNITY): Payer: Self-pay | Admitting: General Surgery

## 2016-12-17 NOTE — Anesthesia Postprocedure Evaluation (Signed)
Anesthesia Post Note  Patient: [name]  Procedure(s) Performed: RIGHT BREAST LUMPECTOMY WITH RADIOACTIVE SEED AND SENTINEL LYMPH NODE BIOPSY (Right Breast)     Patient location during evaluation: PACU Anesthesia Type: General Level of consciousness: sedated and patient cooperative Pain management: pain level controlled Vital Signs Assessment: post-procedure vital signs reviewed and stable Respiratory status: spontaneous breathing Cardiovascular status: stable Anesthetic complications: no    Last Vitals:  Vitals:   12/16/16 1148 12/16/16 1203  BP: 100/79 97/76  Pulse: 68 62  Resp: 15 11  Temp:  36.6 C  SpO2: 97% 96%    Last Pain:  Vitals:   12/16/16 1103  TempSrc:   PainSc: 0-No pain                 Nolon Nations

## 2016-12-18 ENCOUNTER — Ambulatory Visit: Payer: Federal, State, Local not specified - PPO | Admitting: Vascular Surgery

## 2016-12-19 ENCOUNTER — Telehealth: Payer: Self-pay | Admitting: *Deleted

## 2016-12-19 NOTE — Telephone Encounter (Signed)
Ordered oncotype per Dr. Jana Hakim. Faxed PA to [facility] and faxed requisition to pathology and confirmed receipt.

## 2016-12-27 ENCOUNTER — Encounter (HOSPITAL_COMMUNITY): Payer: Self-pay

## 2016-12-27 ENCOUNTER — Telehealth: Payer: Self-pay | Admitting: *Deleted

## 2016-12-27 DIAGNOSIS — Z170 Estrogen receptor positive status [ER+]: Principal | ICD-10-CM

## 2016-12-27 DIAGNOSIS — C50511 Malignant neoplasm of lower-outer quadrant of right female breast: Secondary | ICD-10-CM

## 2016-12-27 NOTE — Telephone Encounter (Signed)
Received oncotype score of 18/11%. Physician team notified.  Called pt with results. Confirmed appt with Dr. Jana Hakim on 12/3 and informed she will see Dr. Sondra Come for xrt. Received verbal understanding.  Referral placed for Dr. Sondra Come

## 2016-12-31 ENCOUNTER — Encounter: Payer: Self-pay | Admitting: Radiation Oncology

## 2017-01-08 NOTE — Progress Notes (Signed)
[place]  Telephone:([phone] Fax:(336) [phone]     ID: [name] DOB: 1955/10/09  MR#: [id number]  IFO#:[id number]  Patient Care Team: Shanon Rosser, PA-C as PCP - General (Physician Assistant) Rolm Bookbinder, MD as Consulting Physician (General Surgery) Magrinat, Virgie Dad, MD as Consulting Physician (Oncology) Gery Pray, MD as Consulting Physician (Radiation Oncology)  OTHER MD:  CHIEF COMPLAINT: Estrogen receptor positive breast cancer  CURRENT TREATMENT: Adjuvant radiation pending   HISTORY OF CURRENT ILLNESS: From the original intake note:  [name] had routine bilateral screening mammography with tomography at the [facility] 10/08/2016. An area of possible distortion in the right breast was noted. She was recalled for right diagnostic mammography with tomography on 11/08/2016. This found the breast density to be category C. The small area of architectural distortion in the lateral right breast persisted and on 11/11/2016 biopsy of this area showed ([id number]) invasive ductal carcinoma, E-cadherin positive, estrogen receptor 95% positive with strong staining intensity, progesterone receptor 5% positive, with strong staining intensity, with an MIB-1 of 10%, and no HER-2 amplification, with a signals ratio of 1.51 and number per cell 3.40. Right axillary ultrasound 11/13/2016 was sonographically benign.  Of note, she has a history of DVT diagnosed March 2010, felt to be possibly related to estrogen replacement therapy. She was coumadinized but developed a pulmonary embolus while on Coumadin. She was then had an IVC filter placed and was anticoagulated with Lovenox for 2 years. An extensive hypercoagulable workup was negative except for an elevated factor VIII level. Her IVC filter is still in place and the patient tells me she is participating in a lawsuit regarding that.   The patient's subsequent history is as detailed below.  INTERVAL  HISTORY: [name] returns today for follow-up and treatment of her estrogen receptor positive breast cancer.  Since her last visit to the office, she underwent biopsies on 12/16/2016 [phone]): Right breast lumpectomy revealing Invasive Ductal Carcinoma grade I/III spanning 1.6 cm. Invasive carcinoma is focally less than 0.1 cm to the lateral/inferior margin of specimen. ; Breast excision of the Right inferior margin revealing fibrocystic changes with adenosis and there was no evidence of malignancy. ; No evidence of malignancy in all 3 right sentinel lymph nodes (0/3).   On 12/16/2016 she also received an oncotype recurrence score of 18.  She is here today to discuss those results.  REVIEW OF SYSTEMS: [name] reports that she has done well as far as pain, following her lumpectomy. She reports that she started feeling a stinging pain under the right underarm since after Thanksgiving. She notes that over the past 3 days, the stinging has been better. On 12/27/2016, she had some fluid removed from the lumpectomy site. She also notes that she feels some sensitivity and soreness under the right arm. She denies fever post lumpectomy. She reports that she is coming back 01/20/17 for radiation simulation. She is completing her ROM exercises for her arms. She notes that she is going to return to the gym after she reinstates her gym membership. She denies unusual headaches, visual changes, nausea, vomiting, or dizziness. There has been no unusual cough, phlegm production, or pleurisy. This been no change in bowel or bladder habits. She denies unexplained fatigue or unexplained weight loss, bleeding, rash, or fever. A detailed review of systems was otherwise stable.    PAST MEDICAL HISTORY: Past Medical History:  Diagnosis Date  . Anxiety   . Arthritis    neck  . Cancer ([place])  right breast cancer  . DVT (deep venous thrombosis) ([place])    04/2010  . GERD (gastroesophageal reflux disease)   .  Headache(784.0) 09/22/2012  . Heart murmur    dx in the 90s - said that it comes and goes - cardiologist said it was a mild murmur  . History of hiatal hernia   . Hyperlipemia   . Hyperlipidemia   . Hypertension   . Hyperthyroidism   . Iron deficiency anemia   . Palpitations   . Pulmonary embolism ([place])   . Uterine fibroid   . Vitamin D deficiency    Graves disease    PAST SURGICAL HISTORY: Past Surgical History:  Procedure Laterality Date  . BREAST LUMPECTOMY WITH RADIOACTIVE SEED AND SENTINEL LYMPH NODE BIOPSY Right 12/16/2016   Procedure: RIGHT BREAST LUMPECTOMY WITH RADIOACTIVE SEED AND SENTINEL LYMPH NODE BIOPSY;  Surgeon: Rolm Bookbinder, MD;  Location: [facility];  Service: General;  Laterality: Right;  . CHOLECYSTECTOMY    . COLONOSCOPY    . DILATION AND CURETTAGE OF UTERUS     01/2008  . ESOPHAGOGASTRODUODENOSCOPY    . ivc filter      FAMILY HISTORY Family History  Problem Relation Age of Onset  . Drug abuse Brother   . Hypertension Mother   . Colon cancer Maternal Grandfather   She notes that her father died at age 75 from an accidental head injury.The patient's mother is 55 years old as of October 2018. Pt has one brother and no sisters. Pt reports that a second cousin has a hx of breast cancer and was dx in her 10's. Pt denies family hx of ovarian cancer.   GYNECOLOGIC HISTORY:  Patient's last menstrual period was 06/18/2010. Menarche: 61 years old Age at first live birth: No children GP: GXP0 LMP: March 2016 Contraceptive: OCP on and off for many years d/c after DVT and PE diagnosis.  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 She denies having pets at home.    ADVANCED DIRECTIVES: Not in place. At the 11/20/2016 visit the patient was given the appropriate documents to complete and notarize at her discretion   HEALTH MAINTENANCE: Social History   Tobacco Use  . Smoking status: Former Smoker    Packs/day: 0.40     Years: 10.00    Pack years: 4.00    Types: Cigarettes    Last attempt to quit: 06/01/2002    Years since quitting: 14.6  . Smokeless tobacco: Never Used  Substance Use Topics  . Alcohol use: Yes    Comment: occasional 1 drink per month  . Drug use: No     Colonoscopy: 2013  PAP: 08/30/2016   Bone density: 10/04/2015 with T-score of -0.3 at femur neck right   Allergies  Allergen Reactions  . Amoxicillin-Pot Clavulanate Other (See Comments)    dizziness  . Niacin-Lovastatin Er Other (See Comments)    flushing    Current Outpatient Medications  Medication Sig Dispense Refill  . acetaminophen (TYLENOL) 325 MG tablet Take 650 mg by mouth every 6 (six) hours as needed for mild pain.    . Ascorbic Acid (VITAMIN C) 1000 MG tablet Take 1,000 mg by mouth daily.    Marland Kitchen aspirin 81 MG tablet Take 81 mg by mouth every evening.     Marland Kitchen atorvastatin (LIPITOR) 20 MG tablet Take 20 mg by mouth daily at 6 PM.     . Biotin 10000 MCG TABS Take 1  tablet by mouth daily.    Marland Kitchen CALCIUM-VITAMIN D PO Take 1 tablet by mouth daily.    . Camphor-Eucalyptus-Menthol (VICKS VAPORUB EX) Apply 1 application topically daily as needed (itching).    . candesartan (ATACAND) 4 MG tablet Take 4 mg by mouth every evening.     . Cholecalciferol (VITAMIN D PO) Take 1 tablet by mouth daily.    Marland Kitchen LORazepam (ATIVAN) 0.5 MG tablet Take 0.5 mg by mouth daily as needed for anxiety.   0  . Melatonin 10 MG TABS Take 5 mg by mouth at bedtime.     . methimazole (TAPAZOLE) 10 MG tablet Take 5 mg by mouth daily.     . Multiple Vitamin (MULTIVITAMIN WITH MINERALS) TABS tablet Take 1 tablet by mouth daily.    Marland Kitchen neomycin-bacitracin-polymyxin (NEOSPORIN) ointment Apply 1 application topically 2 (two) times daily as needed for wound care. apply to eye    . niacin 500 MG tablet Take 500 mg by mouth daily.    . Omega-3 Fatty Acids (FISH OIL) 1200 MG CAPS Take 1 capsule by mouth daily.    Vladimir Faster Glycol-Propyl Glycol (SYSTANE OP) Apply 1  drop to eye daily as needed (dry eyes).    . pyridOXINE (VITAMIN B-6) 100 MG tablet Take 100 mg by mouth daily.    Marland Kitchen SALINE NASAL MIST NA Place 1-2 sprays into the nose daily as needed (dryness).     No current facility-administered medications for this visit.     OBJECTIVE: Middle-aged African-American Allison who appears well  Vitals:   01/13/17 1101  BP: 138/83  Pulse: 62  Resp: 18  Temp: 98.6 F (37 C)  SpO2: 99%     Body mass index is 29.63 kg/m.   Wt Readings from Last 3 Encounters:  01/13/17 183 lb 9.6 oz (83.3 kg)  01/13/17 184 lb (83.5 kg)  12/16/16 182 lb 3.2 oz (82.6 kg)      ECOG FS:0 - Asymptomatic  Sclerae unicteric, pupils round and equal Oropharynx clear and moist No cervical or supraclavicular adenopathy Lungs no rales or rhonchi Heart regular rate and rhythm Abd soft, nontender, positive bowel sounds MSK no focal spinal tenderness, no upper extremity lymphedema Neuro: nonfocal, well oriented, appropriate affect Breasts: The right breast is status post recent lumpectomy.  The cosmetic result is excellent.  I do not think she is going to be able to see any scar tissue in the future and the breast contour is well preserved.  There is no erythema, dehiscence or swelling the left breast is benign.  Both axilla are benign.  LAB RESULTS:  CMP     Component Value Date/Time   NA 139 12/11/2016 0937   NA 141 11/20/2016 0822   K 4.1 12/11/2016 0937   K 3.6 11/20/2016 0822   CL 108 12/11/2016 0937   CL 105 06/24/2012 1548   CO2 25 12/11/2016 0937   CO2 24 11/20/2016 0822   GLUCOSE 125 (H) 12/11/2016 0937   GLUCOSE 102 11/20/2016 0822   GLUCOSE 91 06/24/2012 1548   BUN 11 12/11/2016 0937   BUN 19.2 11/20/2016 0822   CREATININE 0.82 12/11/2016 0937   CREATININE 0.9 11/20/2016 0822   CALCIUM 9.3 12/11/2016 0937   CALCIUM 9.5 11/20/2016 0822   PROT 8.2 11/20/2016 0822   ALBUMIN 3.8 11/20/2016 0822   AST 15 11/20/2016 0822   ALT 15 11/20/2016 0822    ALKPHOS 122 11/20/2016 0822   BILITOT 0.52 11/20/2016 0822   GFRNONAA >60 12/11/2016  [place] 12/11/2016 0937    No results found for: TOTALPROTELP, ALBUMINELP, A1GS, A2GS, BETS, BETA2SER, GAMS, MSPIKE, SPEI  No results found for: KPAFRELGTCHN, LAMBDASER, [facility]  Lab Results  Component Value Date   WBC 5.0 12/11/2016   NEUTROABS 3.1 11/20/2016   HGB 12.8 12/11/2016   HCT 38.8 12/11/2016   MCV 97.0 12/11/2016   PLT 162 12/11/2016    '@LASTCHEMISTRY' @  Lab Results  Component Value Date   LABCA2 17 04/26/2008    No components found for: ASTMHD622  No results for input(s): INR in the last 168 hours.  Lab Results  Component Value Date   LABCA2 17 04/26/2008    No results found for: WLN989  No results found for: QJJ941  No results found for: DEY814  No results found for: CA2729  No components found for: HGQUANT  No results found for: CEA1 / No results found for: CEA1   No results found for: AFPTUMOR  No results found for: [place]  No results found for: PSA1  No visits with results within 3 Day(s) from this visit.  Latest known visit with results is:  Hospital Outpatient Visit on 12/11/2016  Component Date Value Ref Range Status  . Sodium 12/11/2016 139  135 - 145 mmol/L Final  . Potassium 12/11/2016 4.1  3.5 - 5.1 mmol/L Final  . Chloride 12/11/2016 108  101 - 111 mmol/L Final  . CO2 12/11/2016 25  22 - 32 mmol/L Final  . Glucose, Bld 12/11/2016 125* 65 - 99 mg/dL Final  . BUN 12/11/2016 11  6 - 20 mg/dL Final  . Creatinine, Ser 12/11/2016 0.82  0.44 - 1.00 mg/dL Final  . Calcium 12/11/2016 9.3  8.9 - 10.3 mg/dL Final  . GFR calc non Af Amer 12/11/2016 >60  >60 mL/min Final  . GFR calc Af Amer 12/11/2016 >60  >60 mL/min Final   Comment: (NOTE) The eGFR has been calculated using the CKD EPI equation. This calculation has not been validated in all clinical situations. eGFR's persistently <60 mL/min signify possible Chronic  Kidney Disease.   . Anion gap 12/11/2016 6  5 - 15 Final  . WBC 12/11/2016 5.0  4.0 - 10.5 K/uL Final  . RBC 12/11/2016 4.00  3.87 - 5.11 MIL/uL Final  . Hemoglobin 12/11/2016 12.8  12.0 - 15.0 g/dL Final  . HCT 12/11/2016 38.8  36.0 - 46.0 % Final  . MCV 12/11/2016 97.0  78.0 - 100.0 fL Final  . MCH 12/11/2016 32.0  26.0 - 34.0 pg Final  . MCHC 12/11/2016 33.0  30.0 - 36.0 g/dL Final  . RDW 12/11/2016 13.2  11.5 - 15.5 % Final  . Platelets 12/11/2016 162  150 - 400 K/uL Final    (this displays the last labs from the last 3 days)  No results found for: TOTALPROTELP, ALBUMINELP, A1GS, A2GS, BETS, BETA2SER, GAMS, MSPIKE, SPEI (this displays SPEP labs)  No results found for: KPAFRELGTCHN, LAMBDASER, KAPLAMBRATIO (kappa/lambda light chains)  No results found for: HGBA, HGBA2QUANT, HGBFQUANT, HGBSQUAN (Hemoglobinopathy evaluation)   No results found for: LDH  Lab Results  Component Value Date   IRON 61 12/25/2012   TIBC 331 12/25/2012   IRONPCTSAT 18 (L) 12/25/2012   (Iron and TIBC)  Lab Results  Component Value Date   FERRITIN 31 12/25/2012    Urinalysis    Component Value Date/Time   COLORURINE LT YELLOW 04/18/2008 1220   APPEARANCEUR Clear 04/18/2008 1220   LABSPEC > OR = 1.030 04/18/2008 1220  PHURINE 5.5 04/18/2008 1220   GLUCOSEU NEGATIVE 04/18/2008 1220   BILIRUBINUR NEGATIVE 04/18/2008 1220   KETONESUR NEGATIVE 04/18/2008 1220   UROBILINOGEN 0.2 mg/dL 04/18/2008 1220   NITRITE Negative 04/18/2008 1220   LEUKOCYTESUR Negative 04/18/2008 1220     STUDIES: Nm Sentinel Node Inj-no Rpt (breast)  Result Date: 12/16/2016 Sulfur colloid was injected by the nuclear medicine technologist for melanoma sentinel node.   Mm Breast Surgical Specimen  Result Date: 12/16/2016 CLINICAL DATA:  Post right breast lumpectomy. EXAM: SPECIMEN RADIOGRAPH OF THE RIGHT BREAST COMPARISON:  Previous exam(s). FINDINGS: Status post excision of the right breast. The radioactive  seed and biopsy marker clip are present, completely intact, and were marked for pathology. IMPRESSION: Specimen radiograph of the right breast. Electronically Signed   By: Fidela Salisbury M.D.   On: 12/16/2016 10:22    ELIGIBLE FOR AVAILABLE RESEARCH PROTOCOL: no  ASSESSMENT: 61 y.o. [name] status post right breast lower outer quadrant biopsy 11/11/2016 for a clinical TX N), stage I invasive ductal carcinoma, grade 2, E-cadherin positive, estrogen receptor 95% positive, progesterone receptor 5% positive, with an MIB-1 of 10%, and no HER-2 amplification  (1) status post right lumpectomy with sentinel lymph node sampling 12/16/2016 for a pT1c pN0, stage IA invasive ductal carcinoma, grade 1, with negative margins.  (2) Oncotype DX score of 18 predicts a 10-year risk of outside the breast recurrence of 11% if the patient's only systemic therapy is tamoxifen for 5 years.  It also predicts no significant benefit from chemotherapy.  (3) adjuvant radiation pending  (4) anastrozole to start at the completion of local treatment.  (5) history of DVT/PE March 2010, with negative extensive hypercoagulable workup, IVC filter in place  PLAN: I spent approximately 30 minutes with [name] with most of that time spent discussing her prognosis and plan.  We reviewed her biopsy results and also the fact that it is very normal to have sensitivity in the breast, soreness, and even shooting pains and that these can last for years, not indicating that there is disease there, only that she had surgery  The surgical results are excellent.  She is very pleased cosmetically as well as with regards to her overall treatment.  We reviewed the results of her Oncotype testing and since she will be taking anastrozole, not tamoxifen  (because of the history of DVT) the final risk of recurrence outside the breast in 10 years will be closer to 8% then to 11%  She is will have simulation next week.  She will return  to see me in February.  By that time she should be able to start her antiestrogens  She knows to call for any other issues that may develop before the next visit.   Chauncey Cruel, MD 01/13/17  11:23 AM Medical Oncology and Hematology [facility] [address] [place], [place] [address] Tel. [phone] Fax. [phone]   This document serves as a record of services personally performed by Lurline Del, MD. It was created on his behalf by Sheron Nightingale, a trained medical scribe. The creation of this record is based on the scribe's personal observations and the provider's statements to them.   I have reviewed the above documentation for accuracy and completeness, and I agree with the above.

## 2017-01-10 NOTE — Progress Notes (Signed)
Location of Breast Cancer: lower-outer quadrant of right breast   Histology per Pathology Report:   12/16/16 Diagnosis 1. Breast, lumpectomy, Right - INVASIVE DUCTAL CARCINOMA, GRADE I/III, SPANNING 1.6 CM. - INVASIVE CARCINOMA IS FOCALLY LESS THAN 0.1 CM TO THE LATERAL/INFERIOR MARGIN OF SPECIMEN # 1. - SEE ONCOLOGY TABLE BELOW. 2. Breast, excision, Right Inferior Margin - FIBROCYSTIC CHANGES WITH ADENOSIS. - THERE IS NO EVIDENCE OF MALIGNANCY. - SEE COMMENT. 3. Lymph node, sentinel, biopsy, Right - THERE IS NO EVIDENCE OF CARCINOMA IN 1 OF 1 LYMPH NODE (0/1). 4. Lymph node, sentinel, biopsy, Right - THERE IS NO EVIDENCE OF CARCINOMA IN 1 OF 1 LYMPH NODE (0/1). 5. Lymph node, sentinel, biopsy, Right - THERE IS NO EVIDENCE OF CARCINOMA IN 1 OF 1 LYMPH NODE (0/1).  11/11/16 Diagnosis Breast, right, needle core biopsy, lower outer right breast - INVASIVE MAMMARY CARCINOMA, SEE COMMENT.  Receptor Status: ER(95%), PR (5%), Her2-neu (negative), Ki-(10%)  Did patient present with symptoms (if so, please note symptoms) or was this found on screening mammography?: screening mammogram  Past/Anticipated interventions by surgeon, if any: 12/16/16 - Procedure: RIGHT BREAST LUMPECTOMY WITH RADIOACTIVE SEED AND SENTINEL LYMPH NODE BIOPSY;  Surgeon: Rolm Bookbinder, MD  Past/Anticipated interventions by medical oncology, if any: Oncotype score of 18. Anti-estrogens to start at the completion of local treatment.  Lymphedema issues, if any:  no  Pain issues, if any:  no   SAFETY ISSUES:  Prior radiation? No   Pacemaker/ICD? no  Possible current pregnancy?no  Is the patient on methotrexate? no  Current Complaints / other details:  Patient is here with her friend.  BP (!) 143/78 (BP Location: Left Arm, Patient Position: Sitting)   Pulse 63   Temp 97.9 F (36.6 C) (Oral)   Ht '5\' 6"'  (1.676 m)   Wt 184 lb (83.5 kg)   LMP 06/18/2010   SpO2 100%   BMI 29.70 kg/m    Wt Readings  from Last 3 Encounters:  01/13/17 184 lb (83.5 kg)  12/16/16 182 lb 3.2 oz (82.6 kg)  12/11/16 182 lb 3.2 oz (82.6 kg)       Jacqulyn Liner, RN 01/10/2017,8:08 AM

## 2017-01-13 ENCOUNTER — Other Ambulatory Visit: Payer: Self-pay

## 2017-01-13 ENCOUNTER — Ambulatory Visit
Admission: RE | Admit: 2017-01-13 | Discharge: 2017-01-13 | Disposition: A | Discharge status: Home / Self Care | Payer: Federal, State, Local not specified - PPO | Source: Ambulatory Visit | Attending: Radiation Oncology | Admitting: Radiation Oncology

## 2017-01-13 ENCOUNTER — Telehealth: Payer: Self-pay | Admitting: Oncology

## 2017-01-13 ENCOUNTER — Encounter: Payer: Self-pay | Admitting: Radiation Oncology

## 2017-01-13 ENCOUNTER — Ambulatory Visit (HOSPITAL_BASED_OUTPATIENT_CLINIC_OR_DEPARTMENT_OTHER): Payer: Federal, State, Local not specified - PPO | Admitting: Oncology

## 2017-01-13 VITALS — BP 138/83 | HR 62 | Temp 98.6°F | Resp 18 | Ht 66.0 in | Wt 183.6 lb

## 2017-01-13 DIAGNOSIS — Z170 Estrogen receptor positive status [ER+]: Principal | ICD-10-CM

## 2017-01-13 DIAGNOSIS — C50511 Malignant neoplasm of lower-outer quadrant of right female breast: Secondary | ICD-10-CM | POA: Diagnosis not present

## 2017-01-13 DIAGNOSIS — D509 Iron deficiency anemia, unspecified: Secondary | ICD-10-CM | POA: Diagnosis not present

## 2017-01-13 DIAGNOSIS — E559 Vitamin D deficiency, unspecified: Secondary | ICD-10-CM | POA: Diagnosis not present

## 2017-01-13 DIAGNOSIS — Z86711 Personal history of pulmonary embolism: Secondary | ICD-10-CM | POA: Diagnosis not present

## 2017-01-13 DIAGNOSIS — I10 Essential (primary) hypertension: Secondary | ICD-10-CM | POA: Diagnosis not present

## 2017-01-13 DIAGNOSIS — Z510 Encounter for antineoplastic radiation therapy: Secondary | ICD-10-CM | POA: Diagnosis not present

## 2017-01-13 DIAGNOSIS — Z9889 Other specified postprocedural states: Secondary | ICD-10-CM | POA: Diagnosis not present

## 2017-01-13 DIAGNOSIS — Z86718 Personal history of other venous thrombosis and embolism: Secondary | ICD-10-CM | POA: Diagnosis not present

## 2017-01-13 DIAGNOSIS — E785 Hyperlipidemia, unspecified: Secondary | ICD-10-CM | POA: Diagnosis not present

## 2017-01-13 NOTE — Telephone Encounter (Signed)
Gave patient AVS and calendar of upcoming February appointments.  °

## 2017-01-13 NOTE — Progress Notes (Signed)
Radiation Oncology         [phone] ________________________________  Name: [name] MRN  : [id number]  Date: 01/13/2017  DOB: Oct 28, 1955  Re-consultation Visit Note  CC: Shanon Rosser, PA-C  Magrinat, Virgie Dad, MD    ICD-10-CM   1. Malignant neoplasm of lower-outer quadrant of right breast of female, estrogen receptor positive ([place]) C50.511    Z17.0     Diagnosis:  Invasive ductal carcinoma of the LOQ of the right breast (G1, pT1c, pN0, pMX), ER/PR(+), Her2 (-), Oncotype score: 18  Narrative:  The patient returns today for routine follow-up. Initially, the patient presented into the multidisciplinary breast clinic on 11/20/16. Since we last saw her, she has underwent right breast lumpectomy with sentinel lymph node biopsy which was performed by Dr Donne Hazel on 12/16/16. This was performed without complication, per op notes. Surgical pathology from this procedure demonstrated invasive ductal carcinoma, spanning 1.6 cm. Additionally, there was <0.1cm evidence of carcinoma within the lateral/inferior margin. Additional tissue was taken intraoperatively improving this margin. Three lymph nodes were also surveyed during this procedure, none of which contained evidence of carcinoma. Histochemical assay from this additionally showed her cancer to be ER(+) at 95% with strong staining intensity, PR(+) at 5% at strong staining intensity, Her2 negative.   In the interim, she has been recovering well. She only had to take Tylenol 2-3 times to control her pain since her surgery, otherwise she was without acute complaint. Mainly she has experienced burning to her surgical site which was worse with palpation. She did have some swelling in the axillary region as well, but she reports that Dr Donne Hazel did remove some fluid from her axillary surgical bed and this has not been an issue since. She denies any issues with ROM and she has been performing her exercises regularly.   On review of systems,  pt denies fever, chills, rash, mouth sores, weight loss, decreased appetite, urinary complaints. Pt denies abdominal pain, nausea, vomiting. Pertinent positives are listed within the above HPI.   ALLERGIES:  is allergic to amoxicillin-pot clavulanate and niacin-lovastatin er.  Meds: Current Outpatient Medications  Medication Sig Dispense Refill  . acetaminophen (TYLENOL) 325 MG tablet Take 650 mg by mouth every 6 (six) hours as needed for mild pain.    . Ascorbic Acid (VITAMIN C) 1000 MG tablet Take 1,000 mg by mouth daily.    Marland Kitchen aspirin 81 MG tablet Take 81 mg by mouth every evening.     Marland Kitchen atorvastatin (LIPITOR) 20 MG tablet Take 20 mg by mouth daily at 6 PM.     . candesartan (ATACAND) 4 MG tablet Take 4 mg by mouth every evening.     Marland Kitchen LORazepam (ATIVAN) 0.5 MG tablet Take 0.5 mg by mouth daily as needed for anxiety.   0  . Melatonin 10 MG TABS Take 5 mg by mouth at bedtime.     . methimazole (TAPAZOLE) 10 MG tablet Take 5 mg by mouth daily.     Marland Kitchen neomycin-bacitracin-polymyxin (NEOSPORIN) ointment Apply 1 application topically 2 (two) times daily as needed for wound care. apply to eye    . Polyethyl Glycol-Propyl Glycol (SYSTANE OP) Apply 1 drop to eye daily as needed (dry eyes).    . Biotin 10000 MCG TABS Take 1 tablet by mouth daily.    Marland Kitchen CALCIUM-VITAMIN D PO Take 1 tablet by mouth daily.    . Camphor-Eucalyptus-Menthol (VICKS VAPORUB EX) Apply 1 application topically daily as needed (itching).    Marland Kitchen  Cholecalciferol (VITAMIN D PO) Take 1 tablet by mouth daily.    . Multiple Vitamin (MULTIVITAMIN WITH MINERALS) TABS tablet Take 1 tablet by mouth daily.    . niacin 500 MG tablet Take 500 mg by mouth daily.    . Omega-3 Fatty Acids (FISH OIL) 1200 MG CAPS Take 1 capsule by mouth daily.    Marland Kitchen pyridOXINE (VITAMIN B-6) 100 MG tablet Take 100 mg by mouth daily.    Marland Kitchen SALINE NASAL MIST NA Place 1-2 sprays into the nose daily as needed (dryness).     No current facility-administered medications  for this encounter.     Physical Findings: The patient is in no acute distress. Patient is alert and oriented.  height is _0  (1.676 m) and weight is 184 lb (83.5 kg). Her oral temperature is 97.9 F (36.6 C). Her blood pressure is 143/78 (abnormal) and her pulse is 63. Her oxygen saturation is 100%. .  No significant changes. Lungs are clear to auscultation bilaterally. Heart has regular rate and rhythm. No palpable cervical, supraclavicular, or axillary adenopathy. Abdomen soft, non-tender, normal bowel sounds. Right breast steri-strips along the areolar border. No signs of drainage or infection in the breast. She also has a scar in the axillary region which is healing well without signs of drainage or infection.       Lab Findings: Lab Results  Component Value Date   WBC 5.0 12/11/2016   HGB 12.8 12/11/2016   HCT 38.8 12/11/2016   MCV 97.0 12/11/2016   PLT 162 12/11/2016    Radiographic Findings: Nm Sentinel Node Inj-no Rpt (breast)  Result Date: 12/16/2016 Sulfur colloid was injected by the nuclear medicine technologist for melanoma sentinel node.   Mm Breast Surgical Specimen  Result Date: 12/16/2016 CLINICAL DATA:  Post right breast lumpectomy. EXAM: SPECIMEN RADIOGRAPH OF THE RIGHT BREAST COMPARISON:  Previous exam(s). FINDINGS: Status post excision of the right breast. The radioactive seed and biopsy marker clip are present, completely intact, and were marked for pathology. IMPRESSION: Specimen radiograph of the right breast. Electronically Signed   By: Fidela Salisbury M.D.   On: 12/16/2016 10:22    Impression:  The patient presents today for follow up to discuss the role of radiotherapy in the ongoing management of her invasive ductal carcinoma of the LOQ of the right breast (G1, pT1c, pN0, pMX), ER/PR(+), Her2 (-), Oncotype score: 18. The risks vs benefits, side effects, and potential toxicities were discussed at great length with the patient. She would be a good  candidate for breast conservation therapy with radiation directed at the right breast. I informed them that they may experience possible fatigue and radiation-related skin changes within her treatment field. The patient appears to understand and wishes to proceed with planned course of treatment.   Plan:  The patient will proceed with CT Simulation next week on Dec 10th at 10am with treatments to begin approximately 6 weeks postop.   ____________________________________ -----------------------------------  Blair Promise, PhD, MD  This document serves as a record of services personally performed by Gery Pray, MD. It was created on his behalf by Reola Mosher, a trained medical scribe. The creation of this record is based on the scribe's personal observations and the provider's statements to them. This document has been checked and approved by the attending provider.

## 2017-01-16 ENCOUNTER — Encounter: Payer: Self-pay | Admitting: Physical Therapy

## 2017-01-16 ENCOUNTER — Ambulatory Visit: Payer: Federal, State, Local not specified - PPO | Attending: General Surgery | Admitting: Physical Therapy

## 2017-01-16 DIAGNOSIS — Z170 Estrogen receptor positive status [ER+]: Secondary | ICD-10-CM | POA: Diagnosis not present

## 2017-01-16 DIAGNOSIS — R293 Abnormal posture: Secondary | ICD-10-CM | POA: Diagnosis not present

## 2017-01-16 DIAGNOSIS — M25611 Stiffness of right shoulder, not elsewhere classified: Secondary | ICD-10-CM | POA: Diagnosis not present

## 2017-01-16 DIAGNOSIS — C50511 Malignant neoplasm of lower-outer quadrant of right female breast: Secondary | ICD-10-CM | POA: Insufficient documentation

## 2017-01-16 NOTE — Therapy (Signed)
[place], [place], [address] Phone: [phone]   Fax:  [phone]  Physical Therapy Treatment  Patient Details  Name: [name] MRN: [id number] Date of Birth: 05/28/1955 Referring Provider: Dr. Rolm Bookbinder   Encounter Date: 01/16/2017  PT End of Session - 01/16/17 1150    Visit Number  2    Number of Visits  2    PT Start Time  1100    PT Stop Time  1147    PT Time Calculation (min)  47 min    Activity Tolerance  Patient tolerated treatment well    Behavior During Therapy  [facility] for tasks assessed/performed       Past Medical History:  Diagnosis Date  . Anxiety   . Arthritis    neck  . Cancer [facility])    right breast cancer  . DVT (deep venous thrombosis) ([place])    04/2010  . GERD (gastroesophageal reflux disease)   . Headache(784.0) 09/22/2012  . Heart murmur    dx in the 90s - said that it comes and goes - cardiologist said it was a mild murmur  . History of hiatal hernia   . Hyperlipemia   . Hyperlipidemia   . Hypertension   . Hyperthyroidism   . Iron deficiency anemia   . Palpitations   . Pulmonary embolism ([place])   . Uterine fibroid   . Vitamin D deficiency    Graves disease    Past Surgical History:  Procedure Laterality Date  . BREAST LUMPECTOMY WITH RADIOACTIVE SEED AND SENTINEL LYMPH NODE BIOPSY Right 12/16/2016   Procedure: RIGHT BREAST LUMPECTOMY WITH RADIOACTIVE SEED AND SENTINEL LYMPH NODE BIOPSY;  Surgeon: Rolm Bookbinder, MD;  Location: [facility];  Service: General;  Laterality: Right;  . CHOLECYSTECTOMY    . COLONOSCOPY    . DILATION AND CURETTAGE OF UTERUS     01/2008  . ESOPHAGOGASTRODUODENOSCOPY    . ivc filter      There were no vitals filed for this visit.  Subjective Assessment - 01/16/17 1101    Subjective  She underwent a right lumpectomy and sentinel node biopsy with 3 negative nodes removed. Oncotype score was 18 with 11% risk of recurrence so  she will not undergo chemotherapy. She will have radiation simulation 01/20/17 and then undergo anti-estrogen therapy.    Pertinent History  Patient was diagnosed on 10/08/16 with right grade 2 invasive ductal carcinoma breast cancer. It is an area of distortion that is ER/PR positive and HER2 negative with a Ki67 of 10%. She has a history of a PE with an IVC filter in place and a diagnosis of Graves disease in 2016. She underwent a right lumpectomy and sentinel node biopsy with 3 negative nodes removed. Oncotype score was 18 with 11% risk of recurrence so she will not undergo chemotherapy. She will have radiation simulation 01/20/17 and then undergo anti-estrogen therapy.    Patient Stated Goals  Make sure my arm is ok for radiation    Currently in Pain?  Yes    Pain Score  3     Pain Location  Groin    Pain Orientation  Right    Pain Descriptors / Indicators  Tightness    Pain Type  Acute pain    Pain Onset  Yesterday    Pain Frequency  Intermittent    Aggravating Factors   Sitting    Pain Relieving Factors  Standing    Multiple Pain Sites  No         [facility] PT Assessment - 01/16/17 0001      Assessment   Medical Diagnosis  s/p right lumpectomy and SLNB    Referring Provider  Dr. 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    Leisure  She is not exercising      Cognition   Overall Cognitive Status  Within Functional Limits for tasks assessed       Posture/Postural Control   Posture/Postural Control  Postural limitations    Postural Limitations  Forward head;Rounded Shoulders      ROM / Strength   AROM / PROM / Strength  AROM;Strength      AROM   AROM Assessment Site  Shoulder;Cervical    Right/Left Shoulder  Right;Left    Right Shoulder Extension  50 Degrees    Right Shoulder Flexion  141 Degrees    Right Shoulder ABduction  147 Degrees    Right Shoulder Internal Rotation  67 Degrees    Right Shoulder External Rotation  89 Degrees        LYMPHEDEMA/ONCOLOGY QUESTIONNAIRE - 01/16/17 1124      Right Upper Extremity Lymphedema   10 cm Proximal to Olecranon Process  31.4 cm    Olecranon Process  28.2 cm    10 cm Proximal to Ulnar Styloid Process  23.3 cm    Just Proximal to Ulnar Styloid Process  17.1 cm    Across Hand at [employer]  19.8 cm    At [place] of 2nd Digit  6.5 cm      Left Upper Extremity Lymphedema   10 cm Proximal to Olecranon Process  32.5 cm    Olecranon Process  27.6 cm    10 cm Proximal to Ulnar Styloid Process  23.8 cm    Just Proximal to Ulnar Styloid Process  17.4 cm    Across Hand at [employer]  19.6 cm    At [place] of 2nd Digit  6.6 cm        Quick Dash - 01/16/17 0001    Open a tight or new jar  No difficulty    Do heavy household chores (wash walls, wash floors)  No difficulty    Carry a shopping bag or briefcase  No difficulty    Wash your back  Mild difficulty    Use a knife to cut food  No difficulty    Recreational activities in which you take some force or impact through your arm, shoulder, or hand (golf, hammering, tennis)  Unable    During the past week, to what extent has your arm, shoulder or hand problem interfered with your normal social activities with family, friends, neighbors, or groups?  Not at all    During the past  week, to what extent has your arm, shoulder or hand problem limited your work or other regular daily activities  Not at all    Arm, shoulder, or hand  pain.  Moderate    Tingling (pins and needles) in your arm, shoulder, or hand  Moderate    Difficulty Sleeping  Mild difficulty    DASH Score  22.73 %         PT Education - 01/16/17 1149    Education provided  Yes    Education Details  reviewed HEP and educated pt on lymphedema risk and reduction of risk; educated pt on support group services    Person(s) Educated  Patient    Methods  Explanation    Comprehension  Verbalized understanding           Breast Clinic Goals - 11/20/16 1124      Patient will be able to verbalize understanding of pertinent lymphedema risk reduction practices relevant to her diagnosis specifically related to skin care.   Time  1    Period  Days    Status  Achieved      Patient will be able to return demonstrate and/or verbalize understanding of the post-op home exercise program related to regaining shoulder range of motion.   Time  1    Period  Days    Status  Achieved      Patient will be able to verbalize understanding of the importance of attending the postoperative After Breast Cancer Class for further lymphedema risk reduction education and therapeutic exercise.   Time  1    Period  Days    Status  Achieved           Plan - 01/16/17 1150    Clinical Impression Statement  Patient is doing very well s/p lumpectomy and sentinel node biopsy. Her ROM has returned to baseline except 10 degrees of abduction which she feels she can work on independently. She plans to attend the ABC class and possibly support group. There is no other need for PT at this time.    PT Treatment/Interventions  ADLs/Self Care Home Management;Patient/family education    PT Next Visit Plan  Discharge pt; goals met.    PT Home Exercise Plan  Post op shoulder ROM HEP    Consulted and Agree with Plan of Care  Patient       Patient will benefit from skilled therapeutic intervention in order to improve the following deficits and impairments:  Decreased range of motion,  Impaired UE functional use, Pain, Decreased knowledge of precautions, Postural dysfunction  Visit Diagnosis: Carcinoma of lower-outer quadrant of right breast in female, estrogen receptor positive ([facility])  Abnormal posture  Stiffness of right shoulder, not elsewhere classified     Problem List Patient Active Problem List   Diagnosis Date Noted  . Pulmonary emboli ([place]) 11/20/2016  . Malignant neoplasm of lower-outer quadrant of right breast of female, estrogen receptor positive ([place]) 11/19/2016  . Graves disease 10/19/2014  . Palpitation 09/24/2013  . Headache(784.0) 09/22/2012  . Dysarthria 09/22/2012  . LEG PAIN, RIGHT 04/22/2008  . VITAMIN B12 DEFICIENCY 04/19/2008  . ANEMIA, IRON DEFICIENCY 04/19/2008  . HYPERLIPIDEMIA 03/30/2008  . HYPERTENSION 03/30/2008  . ALLERGIC RHINITIS 03/30/2008  . GERD 03/30/2008    Annia Friendly, PT 01/16/17 11:57 AM  [place], [place], [address] Phone: [phone]   Fax:  [phone]  Name: [name] MRN: [id number] Date of Birth:  May 20, 1955

## 2017-01-20 ENCOUNTER — Ambulatory Visit: Payer: Federal, State, Local not specified - PPO | Admitting: Radiation Oncology

## 2017-01-23 ENCOUNTER — Ambulatory Visit
Admission: RE | Admit: 2017-01-23 | Discharge: 2017-01-23 | Disposition: A | Discharge status: Home / Self Care | Payer: Federal, State, Local not specified - PPO | Source: Ambulatory Visit | Attending: Radiation Oncology | Admitting: Radiation Oncology

## 2017-01-23 DIAGNOSIS — Z170 Estrogen receptor positive status [ER+]: Secondary | ICD-10-CM | POA: Diagnosis not present

## 2017-01-23 DIAGNOSIS — Z86711 Personal history of pulmonary embolism: Secondary | ICD-10-CM | POA: Diagnosis not present

## 2017-01-23 DIAGNOSIS — E559 Vitamin D deficiency, unspecified: Secondary | ICD-10-CM | POA: Diagnosis not present

## 2017-01-23 DIAGNOSIS — C50511 Malignant neoplasm of lower-outer quadrant of right female breast: Secondary | ICD-10-CM | POA: Diagnosis not present

## 2017-01-23 DIAGNOSIS — E785 Hyperlipidemia, unspecified: Secondary | ICD-10-CM | POA: Diagnosis not present

## 2017-01-23 DIAGNOSIS — Z9889 Other specified postprocedural states: Secondary | ICD-10-CM | POA: Diagnosis not present

## 2017-01-23 DIAGNOSIS — I10 Essential (primary) hypertension: Secondary | ICD-10-CM | POA: Diagnosis not present

## 2017-01-23 DIAGNOSIS — Z86718 Personal history of other venous thrombosis and embolism: Secondary | ICD-10-CM | POA: Diagnosis not present

## 2017-01-23 DIAGNOSIS — Z510 Encounter for antineoplastic radiation therapy: Secondary | ICD-10-CM | POA: Diagnosis not present

## 2017-01-23 DIAGNOSIS — D509 Iron deficiency anemia, unspecified: Secondary | ICD-10-CM | POA: Diagnosis not present

## 2017-01-23 NOTE — Progress Notes (Signed)
  Radiation Oncology         [phone]) [phone] ________________________________  Name: [name] MRN: [id number]  Date: 01/23/2017  DOB: 09/12/1955  SIMULATION AND TREATMENT PLANNING NOTE   DIAGNOSIS:  Invasive ductal carcinoma of the LOQ of the right breast (G1, pT1c, pN0, pMX), ER/PR(+), Her2 (-), Oncotype score: 18   NARRATIVE:  The patient was brought to the [facility].  Identity was confirmed.  All relevant records and images related to the planned course of therapy were reviewed.  The patient freely provided informed written consent to proceed with treatment after reviewing the details related to the planned course of therapy. The consent form was witnessed and verified by the simulation staff.  Then, the patient was set-up in a stable reproducible  supine position for radiation therapy.  CT images were obtained.  Surface markings were placed.  The CT images were loaded into the planning software.  Then the target and avoidance structures were contoured.  Treatment planning then occurred.  The radiation prescription was entered and confirmed.  Then, I designed and supervised the construction of a total of 3 medically necessary complex treatment devices.  I have requested : 3D Simulation  I have requested a DVH of the following structures: Heart, lungs, lumpectomy cavity.  I have ordered:CBC  PLAN:  The patient will receive 40.05 Gy in 15 fractions, followed by a boost to the lumpectomy cavity of 10 gray in 5 fractions for a cumulative dose of 50.05 gray.  -----------------------------------  Blair Promise, PhD, MD

## 2017-01-28 DIAGNOSIS — Z170 Estrogen receptor positive status [ER+]: Secondary | ICD-10-CM | POA: Diagnosis not present

## 2017-01-28 DIAGNOSIS — Z9889 Other specified postprocedural states: Secondary | ICD-10-CM | POA: Diagnosis not present

## 2017-01-28 DIAGNOSIS — E785 Hyperlipidemia, unspecified: Secondary | ICD-10-CM | POA: Diagnosis not present

## 2017-01-28 DIAGNOSIS — I10 Essential (primary) hypertension: Secondary | ICD-10-CM | POA: Diagnosis not present

## 2017-01-28 DIAGNOSIS — Z86718 Personal history of other venous thrombosis and embolism: Secondary | ICD-10-CM | POA: Diagnosis not present

## 2017-01-28 DIAGNOSIS — E559 Vitamin D deficiency, unspecified: Secondary | ICD-10-CM | POA: Diagnosis not present

## 2017-01-28 DIAGNOSIS — D509 Iron deficiency anemia, unspecified: Secondary | ICD-10-CM | POA: Diagnosis not present

## 2017-01-28 DIAGNOSIS — Z510 Encounter for antineoplastic radiation therapy: Secondary | ICD-10-CM | POA: Diagnosis not present

## 2017-01-28 DIAGNOSIS — C50511 Malignant neoplasm of lower-outer quadrant of right female breast: Secondary | ICD-10-CM | POA: Diagnosis not present

## 2017-01-28 DIAGNOSIS — Z86711 Personal history of pulmonary embolism: Secondary | ICD-10-CM | POA: Diagnosis not present

## 2017-01-30 ENCOUNTER — Ambulatory Visit: Payer: Federal, State, Local not specified - PPO | Admitting: Radiation Oncology

## 2017-01-31 ENCOUNTER — Ambulatory Visit: Payer: Federal, State, Local not specified - PPO | Admitting: Radiation Oncology

## 2017-02-05 ENCOUNTER — Ambulatory Visit
Admission: RE | Admit: 2017-02-05 | Discharge: 2017-02-05 | Disposition: A | Discharge status: Home / Self Care | Payer: Federal, State, Local not specified - PPO | Source: Ambulatory Visit | Attending: Radiation Oncology | Admitting: Radiation Oncology

## 2017-02-05 DIAGNOSIS — I10 Essential (primary) hypertension: Secondary | ICD-10-CM | POA: Diagnosis not present

## 2017-02-05 DIAGNOSIS — Z86711 Personal history of pulmonary embolism: Secondary | ICD-10-CM | POA: Diagnosis not present

## 2017-02-05 DIAGNOSIS — Z9889 Other specified postprocedural states: Secondary | ICD-10-CM | POA: Diagnosis not present

## 2017-02-05 DIAGNOSIS — Z510 Encounter for antineoplastic radiation therapy: Secondary | ICD-10-CM | POA: Diagnosis not present

## 2017-02-05 DIAGNOSIS — E785 Hyperlipidemia, unspecified: Secondary | ICD-10-CM | POA: Diagnosis not present

## 2017-02-05 DIAGNOSIS — D509 Iron deficiency anemia, unspecified: Secondary | ICD-10-CM | POA: Diagnosis not present

## 2017-02-05 DIAGNOSIS — Z170 Estrogen receptor positive status [ER+]: Secondary | ICD-10-CM | POA: Diagnosis not present

## 2017-02-05 DIAGNOSIS — C50511 Malignant neoplasm of lower-outer quadrant of right female breast: Secondary | ICD-10-CM | POA: Diagnosis not present

## 2017-02-05 DIAGNOSIS — Z923 Personal history of irradiation: Secondary | ICD-10-CM

## 2017-02-05 DIAGNOSIS — E559 Vitamin D deficiency, unspecified: Secondary | ICD-10-CM | POA: Diagnosis not present

## 2017-02-05 DIAGNOSIS — Z86718 Personal history of other venous thrombosis and embolism: Secondary | ICD-10-CM | POA: Diagnosis not present

## 2017-02-05 HISTORY — DX: Personal history of irradiation: Z92.3

## 2017-02-06 ENCOUNTER — Ambulatory Visit
Admission: RE | Admit: 2017-02-06 | Discharge: 2017-02-06 | Disposition: A | Discharge status: Home / Self Care | Payer: Federal, State, Local not specified - PPO | Source: Ambulatory Visit | Attending: Radiation Oncology | Admitting: Radiation Oncology

## 2017-02-06 DIAGNOSIS — Z510 Encounter for antineoplastic radiation therapy: Secondary | ICD-10-CM | POA: Diagnosis not present

## 2017-02-06 DIAGNOSIS — E785 Hyperlipidemia, unspecified: Secondary | ICD-10-CM | POA: Diagnosis not present

## 2017-02-06 DIAGNOSIS — E559 Vitamin D deficiency, unspecified: Secondary | ICD-10-CM | POA: Diagnosis not present

## 2017-02-06 DIAGNOSIS — Z9889 Other specified postprocedural states: Secondary | ICD-10-CM | POA: Diagnosis not present

## 2017-02-06 DIAGNOSIS — Z86718 Personal history of other venous thrombosis and embolism: Secondary | ICD-10-CM | POA: Diagnosis not present

## 2017-02-06 DIAGNOSIS — I10 Essential (primary) hypertension: Secondary | ICD-10-CM | POA: Diagnosis not present

## 2017-02-06 DIAGNOSIS — Z170 Estrogen receptor positive status [ER+]: Secondary | ICD-10-CM | POA: Diagnosis not present

## 2017-02-06 DIAGNOSIS — D509 Iron deficiency anemia, unspecified: Secondary | ICD-10-CM | POA: Diagnosis not present

## 2017-02-06 DIAGNOSIS — C50511 Malignant neoplasm of lower-outer quadrant of right female breast: Secondary | ICD-10-CM | POA: Diagnosis not present

## 2017-02-06 DIAGNOSIS — Z86711 Personal history of pulmonary embolism: Secondary | ICD-10-CM | POA: Diagnosis not present

## 2017-02-07 ENCOUNTER — Ambulatory Visit
Admission: RE | Admit: 2017-02-07 | Discharge: 2017-02-07 | Disposition: A | Discharge status: Home / Self Care | Payer: Federal, State, Local not specified - PPO | Source: Ambulatory Visit | Attending: Radiation Oncology | Admitting: Radiation Oncology

## 2017-02-07 DIAGNOSIS — C50511 Malignant neoplasm of lower-outer quadrant of right female breast: Secondary | ICD-10-CM | POA: Diagnosis not present

## 2017-02-07 DIAGNOSIS — Z86711 Personal history of pulmonary embolism: Secondary | ICD-10-CM | POA: Diagnosis not present

## 2017-02-07 DIAGNOSIS — Z510 Encounter for antineoplastic radiation therapy: Secondary | ICD-10-CM | POA: Diagnosis not present

## 2017-02-07 DIAGNOSIS — Z86718 Personal history of other venous thrombosis and embolism: Secondary | ICD-10-CM | POA: Diagnosis not present

## 2017-02-07 DIAGNOSIS — Z170 Estrogen receptor positive status [ER+]: Secondary | ICD-10-CM | POA: Diagnosis not present

## 2017-02-07 DIAGNOSIS — I10 Essential (primary) hypertension: Secondary | ICD-10-CM | POA: Diagnosis not present

## 2017-02-07 DIAGNOSIS — D509 Iron deficiency anemia, unspecified: Secondary | ICD-10-CM | POA: Diagnosis not present

## 2017-02-07 DIAGNOSIS — E559 Vitamin D deficiency, unspecified: Secondary | ICD-10-CM | POA: Diagnosis not present

## 2017-02-07 DIAGNOSIS — Z9889 Other specified postprocedural states: Secondary | ICD-10-CM | POA: Diagnosis not present

## 2017-02-07 DIAGNOSIS — E785 Hyperlipidemia, unspecified: Secondary | ICD-10-CM | POA: Diagnosis not present

## 2017-02-10 ENCOUNTER — Ambulatory Visit
Admission: RE | Admit: 2017-02-10 | Discharge: 2017-02-10 | Disposition: A | Discharge status: Home / Self Care | Payer: Federal, State, Local not specified - PPO | Source: Ambulatory Visit | Attending: Radiation Oncology | Admitting: Radiation Oncology

## 2017-02-10 DIAGNOSIS — C50511 Malignant neoplasm of lower-outer quadrant of right female breast: Secondary | ICD-10-CM

## 2017-02-10 DIAGNOSIS — Z510 Encounter for antineoplastic radiation therapy: Secondary | ICD-10-CM | POA: Diagnosis not present

## 2017-02-10 DIAGNOSIS — Z86718 Personal history of other venous thrombosis and embolism: Secondary | ICD-10-CM | POA: Diagnosis not present

## 2017-02-10 DIAGNOSIS — Z9889 Other specified postprocedural states: Secondary | ICD-10-CM | POA: Diagnosis not present

## 2017-02-10 DIAGNOSIS — Z170 Estrogen receptor positive status [ER+]: Principal | ICD-10-CM

## 2017-02-10 DIAGNOSIS — D509 Iron deficiency anemia, unspecified: Secondary | ICD-10-CM | POA: Diagnosis not present

## 2017-02-10 DIAGNOSIS — E559 Vitamin D deficiency, unspecified: Secondary | ICD-10-CM | POA: Diagnosis not present

## 2017-02-10 DIAGNOSIS — E785 Hyperlipidemia, unspecified: Secondary | ICD-10-CM | POA: Diagnosis not present

## 2017-02-10 DIAGNOSIS — I10 Essential (primary) hypertension: Secondary | ICD-10-CM | POA: Diagnosis not present

## 2017-02-10 DIAGNOSIS — Z86711 Personal history of pulmonary embolism: Secondary | ICD-10-CM | POA: Diagnosis not present

## 2017-02-10 MED ORDER — ALRA NON-METALLIC DEODORANT (RAD-ONC)
1.0000 "application " | Freq: Once | TOPICAL | Status: AC
  Administered 2017-02-10: 1 via TOPICAL

## 2017-02-10 MED ORDER — SONAFINE EX EMUL: 1.0000 "application " | Freq: Two times a day (BID) | CUTANEOUS | Status: DC

## 2017-02-10 MED ORDER — RADIAPLEXRX EX GEL
Freq: Once | CUTANEOUS | Status: AC
  Administered 2017-02-10: 13:00:00 via TOPICAL

## 2017-02-10 NOTE — Progress Notes (Signed)

## 2017-02-12 ENCOUNTER — Ambulatory Visit
Admission: RE | Admit: 2017-02-12 | Discharge: 2017-02-12 | Disposition: A | Discharge status: Home / Self Care | Payer: Federal, State, Local not specified - PPO | Source: Ambulatory Visit | Attending: Radiation Oncology | Admitting: Radiation Oncology

## 2017-02-12 DIAGNOSIS — Z9889 Other specified postprocedural states: Secondary | ICD-10-CM | POA: Diagnosis not present

## 2017-02-12 DIAGNOSIS — D509 Iron deficiency anemia, unspecified: Secondary | ICD-10-CM | POA: Diagnosis not present

## 2017-02-12 DIAGNOSIS — E785 Hyperlipidemia, unspecified: Secondary | ICD-10-CM | POA: Diagnosis not present

## 2017-02-12 DIAGNOSIS — E559 Vitamin D deficiency, unspecified: Secondary | ICD-10-CM | POA: Diagnosis not present

## 2017-02-12 DIAGNOSIS — C50511 Malignant neoplasm of lower-outer quadrant of right female breast: Secondary | ICD-10-CM | POA: Diagnosis not present

## 2017-02-12 DIAGNOSIS — Z510 Encounter for antineoplastic radiation therapy: Secondary | ICD-10-CM | POA: Diagnosis not present

## 2017-02-12 DIAGNOSIS — Z170 Estrogen receptor positive status [ER+]: Secondary | ICD-10-CM | POA: Diagnosis not present

## 2017-02-12 DIAGNOSIS — Z86718 Personal history of other venous thrombosis and embolism: Secondary | ICD-10-CM | POA: Diagnosis not present

## 2017-02-12 DIAGNOSIS — I10 Essential (primary) hypertension: Secondary | ICD-10-CM | POA: Diagnosis not present

## 2017-02-12 DIAGNOSIS — Z86711 Personal history of pulmonary embolism: Secondary | ICD-10-CM | POA: Diagnosis not present

## 2017-02-13 ENCOUNTER — Ambulatory Visit
Admission: RE | Admit: 2017-02-13 | Discharge: 2017-02-13 | Disposition: A | Discharge status: Home / Self Care | Payer: Federal, State, Local not specified - PPO | Source: Ambulatory Visit | Attending: Radiation Oncology | Admitting: Radiation Oncology

## 2017-02-13 DIAGNOSIS — E559 Vitamin D deficiency, unspecified: Secondary | ICD-10-CM | POA: Diagnosis not present

## 2017-02-13 DIAGNOSIS — C50511 Malignant neoplasm of lower-outer quadrant of right female breast: Secondary | ICD-10-CM | POA: Diagnosis not present

## 2017-02-13 DIAGNOSIS — E785 Hyperlipidemia, unspecified: Secondary | ICD-10-CM | POA: Diagnosis not present

## 2017-02-13 DIAGNOSIS — D509 Iron deficiency anemia, unspecified: Secondary | ICD-10-CM | POA: Diagnosis not present

## 2017-02-13 DIAGNOSIS — Z510 Encounter for antineoplastic radiation therapy: Secondary | ICD-10-CM | POA: Diagnosis not present

## 2017-02-13 DIAGNOSIS — Z86711 Personal history of pulmonary embolism: Secondary | ICD-10-CM | POA: Diagnosis not present

## 2017-02-13 DIAGNOSIS — Z170 Estrogen receptor positive status [ER+]: Secondary | ICD-10-CM | POA: Diagnosis not present

## 2017-02-13 DIAGNOSIS — Z9889 Other specified postprocedural states: Secondary | ICD-10-CM | POA: Diagnosis not present

## 2017-02-13 DIAGNOSIS — Z86718 Personal history of other venous thrombosis and embolism: Secondary | ICD-10-CM | POA: Diagnosis not present

## 2017-02-13 DIAGNOSIS — I10 Essential (primary) hypertension: Secondary | ICD-10-CM | POA: Diagnosis not present

## 2017-02-14 ENCOUNTER — Ambulatory Visit
Admission: RE | Admit: 2017-02-14 | Discharge: 2017-02-14 | Disposition: A | Discharge status: Home / Self Care | Payer: Federal, State, Local not specified - PPO | Source: Ambulatory Visit | Attending: Radiation Oncology | Admitting: Radiation Oncology

## 2017-02-14 DIAGNOSIS — D509 Iron deficiency anemia, unspecified: Secondary | ICD-10-CM | POA: Diagnosis not present

## 2017-02-14 DIAGNOSIS — I10 Essential (primary) hypertension: Secondary | ICD-10-CM | POA: Diagnosis not present

## 2017-02-14 DIAGNOSIS — Z86718 Personal history of other venous thrombosis and embolism: Secondary | ICD-10-CM | POA: Diagnosis not present

## 2017-02-14 DIAGNOSIS — Z170 Estrogen receptor positive status [ER+]: Secondary | ICD-10-CM | POA: Diagnosis not present

## 2017-02-14 DIAGNOSIS — E559 Vitamin D deficiency, unspecified: Secondary | ICD-10-CM | POA: Diagnosis not present

## 2017-02-14 DIAGNOSIS — Z510 Encounter for antineoplastic radiation therapy: Secondary | ICD-10-CM | POA: Diagnosis not present

## 2017-02-14 DIAGNOSIS — Z9889 Other specified postprocedural states: Secondary | ICD-10-CM | POA: Diagnosis not present

## 2017-02-14 DIAGNOSIS — C50511 Malignant neoplasm of lower-outer quadrant of right female breast: Secondary | ICD-10-CM | POA: Diagnosis not present

## 2017-02-14 DIAGNOSIS — Z86711 Personal history of pulmonary embolism: Secondary | ICD-10-CM | POA: Diagnosis not present

## 2017-02-14 DIAGNOSIS — E785 Hyperlipidemia, unspecified: Secondary | ICD-10-CM | POA: Diagnosis not present

## 2017-02-17 ENCOUNTER — Ambulatory Visit
Admission: RE | Admit: 2017-02-17 | Discharge: 2017-02-17 | Disposition: A | Discharge status: Home / Self Care | Payer: Federal, State, Local not specified - PPO | Source: Ambulatory Visit | Attending: Radiation Oncology | Admitting: Radiation Oncology

## 2017-02-17 DIAGNOSIS — Z9889 Other specified postprocedural states: Secondary | ICD-10-CM | POA: Diagnosis not present

## 2017-02-17 DIAGNOSIS — E785 Hyperlipidemia, unspecified: Secondary | ICD-10-CM | POA: Diagnosis not present

## 2017-02-17 DIAGNOSIS — C50511 Malignant neoplasm of lower-outer quadrant of right female breast: Secondary | ICD-10-CM | POA: Diagnosis not present

## 2017-02-17 DIAGNOSIS — K080 Exfoliation of teeth due to systemic causes: Secondary | ICD-10-CM | POA: Diagnosis not present

## 2017-02-17 DIAGNOSIS — E559 Vitamin D deficiency, unspecified: Secondary | ICD-10-CM | POA: Diagnosis not present

## 2017-02-17 DIAGNOSIS — Z170 Estrogen receptor positive status [ER+]: Secondary | ICD-10-CM | POA: Diagnosis not present

## 2017-02-17 DIAGNOSIS — D509 Iron deficiency anemia, unspecified: Secondary | ICD-10-CM | POA: Diagnosis not present

## 2017-02-17 DIAGNOSIS — Z86718 Personal history of other venous thrombosis and embolism: Secondary | ICD-10-CM | POA: Diagnosis not present

## 2017-02-17 DIAGNOSIS — I10 Essential (primary) hypertension: Secondary | ICD-10-CM | POA: Diagnosis not present

## 2017-02-17 DIAGNOSIS — Z510 Encounter for antineoplastic radiation therapy: Secondary | ICD-10-CM | POA: Diagnosis not present

## 2017-02-17 DIAGNOSIS — Z86711 Personal history of pulmonary embolism: Secondary | ICD-10-CM | POA: Diagnosis not present

## 2017-02-18 ENCOUNTER — Ambulatory Visit
Admission: RE | Admit: 2017-02-18 | Discharge: 2017-02-18 | Disposition: A | Discharge status: Home / Self Care | Payer: Federal, State, Local not specified - PPO | Source: Ambulatory Visit | Attending: Radiation Oncology | Admitting: Radiation Oncology

## 2017-02-18 ENCOUNTER — Ambulatory Visit: Payer: Federal, State, Local not specified - PPO | Admitting: Radiation Oncology

## 2017-02-18 DIAGNOSIS — C50512 Malignant neoplasm of lower-outer quadrant of left female breast: Secondary | ICD-10-CM | POA: Insufficient documentation

## 2017-02-18 DIAGNOSIS — Z510 Encounter for antineoplastic radiation therapy: Secondary | ICD-10-CM | POA: Insufficient documentation

## 2017-02-18 DIAGNOSIS — C50511 Malignant neoplasm of lower-outer quadrant of right female breast: Secondary | ICD-10-CM | POA: Diagnosis not present

## 2017-02-18 DIAGNOSIS — Z170 Estrogen receptor positive status [ER+]: Secondary | ICD-10-CM | POA: Diagnosis not present

## 2017-02-19 ENCOUNTER — Ambulatory Visit
Admission: RE | Admit: 2017-02-19 | Discharge: 2017-02-19 | Disposition: A | Discharge status: Home / Self Care | Payer: Federal, State, Local not specified - PPO | Source: Ambulatory Visit | Attending: Radiation Oncology | Admitting: Radiation Oncology

## 2017-02-19 DIAGNOSIS — Z510 Encounter for antineoplastic radiation therapy: Secondary | ICD-10-CM | POA: Diagnosis not present

## 2017-02-19 DIAGNOSIS — Z170 Estrogen receptor positive status [ER+]: Secondary | ICD-10-CM | POA: Diagnosis not present

## 2017-02-19 DIAGNOSIS — C50511 Malignant neoplasm of lower-outer quadrant of right female breast: Secondary | ICD-10-CM | POA: Diagnosis not present

## 2017-02-19 DIAGNOSIS — C50512 Malignant neoplasm of lower-outer quadrant of left female breast: Secondary | ICD-10-CM | POA: Diagnosis not present

## 2017-02-20 ENCOUNTER — Ambulatory Visit
Admission: RE | Admit: 2017-02-20 | Discharge: 2017-02-20 | Disposition: A | Discharge status: Home / Self Care | Payer: Federal, State, Local not specified - PPO | Source: Ambulatory Visit | Attending: Radiation Oncology | Admitting: Radiation Oncology

## 2017-02-20 DIAGNOSIS — C50512 Malignant neoplasm of lower-outer quadrant of left female breast: Secondary | ICD-10-CM | POA: Diagnosis not present

## 2017-02-20 DIAGNOSIS — Z170 Estrogen receptor positive status [ER+]: Secondary | ICD-10-CM | POA: Diagnosis not present

## 2017-02-20 DIAGNOSIS — Z510 Encounter for antineoplastic radiation therapy: Secondary | ICD-10-CM | POA: Diagnosis not present

## 2017-02-20 DIAGNOSIS — C50511 Malignant neoplasm of lower-outer quadrant of right female breast: Secondary | ICD-10-CM | POA: Diagnosis not present

## 2017-02-21 ENCOUNTER — Ambulatory Visit
Admission: RE | Admit: 2017-02-21 | Discharge: 2017-02-21 | Disposition: A | Discharge status: Home / Self Care | Payer: Federal, State, Local not specified - PPO | Source: Ambulatory Visit | Attending: Radiation Oncology | Admitting: Radiation Oncology

## 2017-02-21 DIAGNOSIS — Z510 Encounter for antineoplastic radiation therapy: Secondary | ICD-10-CM | POA: Diagnosis not present

## 2017-02-21 DIAGNOSIS — C50511 Malignant neoplasm of lower-outer quadrant of right female breast: Secondary | ICD-10-CM | POA: Diagnosis not present

## 2017-02-21 DIAGNOSIS — C50512 Malignant neoplasm of lower-outer quadrant of left female breast: Secondary | ICD-10-CM | POA: Diagnosis not present

## 2017-02-21 DIAGNOSIS — Z170 Estrogen receptor positive status [ER+]: Secondary | ICD-10-CM | POA: Diagnosis not present

## 2017-02-24 ENCOUNTER — Ambulatory Visit
Admission: RE | Admit: 2017-02-24 | Discharge: 2017-02-24 | Disposition: A | Discharge status: Home / Self Care | Payer: Federal, State, Local not specified - PPO | Source: Ambulatory Visit | Attending: Radiation Oncology | Admitting: Radiation Oncology

## 2017-02-24 DIAGNOSIS — C50512 Malignant neoplasm of lower-outer quadrant of left female breast: Secondary | ICD-10-CM | POA: Diagnosis not present

## 2017-02-24 DIAGNOSIS — C50511 Malignant neoplasm of lower-outer quadrant of right female breast: Secondary | ICD-10-CM | POA: Diagnosis not present

## 2017-02-24 DIAGNOSIS — Z170 Estrogen receptor positive status [ER+]: Secondary | ICD-10-CM | POA: Diagnosis not present

## 2017-02-24 DIAGNOSIS — Z510 Encounter for antineoplastic radiation therapy: Secondary | ICD-10-CM | POA: Diagnosis not present

## 2017-02-25 ENCOUNTER — Ambulatory Visit
Admission: RE | Admit: 2017-02-25 | Discharge: 2017-02-25 | Disposition: A | Discharge status: Home / Self Care | Payer: Federal, State, Local not specified - PPO | Source: Ambulatory Visit | Attending: Radiation Oncology | Admitting: Radiation Oncology

## 2017-02-25 DIAGNOSIS — C50511 Malignant neoplasm of lower-outer quadrant of right female breast: Secondary | ICD-10-CM

## 2017-02-25 DIAGNOSIS — Z170 Estrogen receptor positive status [ER+]: Principal | ICD-10-CM

## 2017-02-25 DIAGNOSIS — Z510 Encounter for antineoplastic radiation therapy: Secondary | ICD-10-CM | POA: Diagnosis not present

## 2017-02-25 DIAGNOSIS — C50512 Malignant neoplasm of lower-outer quadrant of left female breast: Secondary | ICD-10-CM | POA: Diagnosis not present

## 2017-02-26 ENCOUNTER — Ambulatory Visit
Admission: RE | Admit: 2017-02-26 | Discharge: 2017-02-26 | Disposition: A | Discharge status: Home / Self Care | Payer: Federal, State, Local not specified - PPO | Source: Ambulatory Visit | Attending: Radiation Oncology | Admitting: Radiation Oncology

## 2017-02-26 DIAGNOSIS — C50511 Malignant neoplasm of lower-outer quadrant of right female breast: Secondary | ICD-10-CM | POA: Diagnosis not present

## 2017-02-26 DIAGNOSIS — Z170 Estrogen receptor positive status [ER+]: Secondary | ICD-10-CM | POA: Diagnosis not present

## 2017-02-26 DIAGNOSIS — C50512 Malignant neoplasm of lower-outer quadrant of left female breast: Secondary | ICD-10-CM | POA: Diagnosis not present

## 2017-02-26 DIAGNOSIS — Z510 Encounter for antineoplastic radiation therapy: Secondary | ICD-10-CM | POA: Diagnosis not present

## 2017-02-27 ENCOUNTER — Encounter: Payer: Self-pay | Admitting: Radiation Oncology

## 2017-02-27 ENCOUNTER — Ambulatory Visit
Admission: RE | Admit: 2017-02-27 | Discharge: 2017-02-27 | Disposition: A | Discharge status: Home / Self Care | Payer: Federal, State, Local not specified - PPO | Source: Ambulatory Visit | Attending: Radiation Oncology | Admitting: Radiation Oncology

## 2017-02-27 DIAGNOSIS — Z510 Encounter for antineoplastic radiation therapy: Secondary | ICD-10-CM | POA: Diagnosis not present

## 2017-02-27 DIAGNOSIS — C50512 Malignant neoplasm of lower-outer quadrant of left female breast: Secondary | ICD-10-CM | POA: Diagnosis not present

## 2017-02-27 DIAGNOSIS — Z170 Estrogen receptor positive status [ER+]: Secondary | ICD-10-CM | POA: Diagnosis not present

## 2017-02-27 NOTE — Progress Notes (Signed)
Received from [employer] Glad, [place] FMLA paperwork on [name] completed by her and Dr. Sondra Come. I have made a copy for her chart to be scanned and the original will go to [name] at the [facility] for her to pick up at time of her treatment.

## 2017-02-28 ENCOUNTER — Ambulatory Visit
Admission: RE | Admit: 2017-02-28 | Discharge: 2017-02-28 | Disposition: A | Discharge status: Home / Self Care | Payer: Federal, State, Local not specified - PPO | Source: Ambulatory Visit | Attending: Radiation Oncology | Admitting: Radiation Oncology

## 2017-02-28 DIAGNOSIS — C50512 Malignant neoplasm of lower-outer quadrant of left female breast: Secondary | ICD-10-CM | POA: Diagnosis not present

## 2017-02-28 DIAGNOSIS — Z510 Encounter for antineoplastic radiation therapy: Secondary | ICD-10-CM | POA: Diagnosis not present

## 2017-02-28 DIAGNOSIS — Z170 Estrogen receptor positive status [ER+]: Secondary | ICD-10-CM | POA: Diagnosis not present

## 2017-03-03 ENCOUNTER — Ambulatory Visit
Admission: RE | Admit: 2017-03-03 | Discharge: 2017-03-03 | Disposition: A | Discharge status: Home / Self Care | Payer: Federal, State, Local not specified - PPO | Source: Ambulatory Visit | Attending: Radiation Oncology | Admitting: Radiation Oncology

## 2017-03-03 DIAGNOSIS — C50512 Malignant neoplasm of lower-outer quadrant of left female breast: Secondary | ICD-10-CM | POA: Diagnosis not present

## 2017-03-03 DIAGNOSIS — Z170 Estrogen receptor positive status [ER+]: Secondary | ICD-10-CM | POA: Diagnosis not present

## 2017-03-03 DIAGNOSIS — Z510 Encounter for antineoplastic radiation therapy: Secondary | ICD-10-CM | POA: Diagnosis not present

## 2017-03-04 ENCOUNTER — Ambulatory Visit
Admission: RE | Admit: 2017-03-04 | Discharge: 2017-03-04 | Disposition: A | Discharge status: Home / Self Care | Payer: Federal, State, Local not specified - PPO | Source: Ambulatory Visit | Attending: Radiation Oncology | Admitting: Radiation Oncology

## 2017-03-04 DIAGNOSIS — C50512 Malignant neoplasm of lower-outer quadrant of left female breast: Secondary | ICD-10-CM | POA: Diagnosis not present

## 2017-03-04 DIAGNOSIS — Z170 Estrogen receptor positive status [ER+]: Secondary | ICD-10-CM | POA: Diagnosis not present

## 2017-03-04 DIAGNOSIS — Z510 Encounter for antineoplastic radiation therapy: Secondary | ICD-10-CM | POA: Diagnosis not present

## 2017-03-05 ENCOUNTER — Ambulatory Visit
Admission: RE | Admit: 2017-03-05 | Discharge: 2017-03-05 | Disposition: A | Discharge status: Home / Self Care | Payer: Federal, State, Local not specified - PPO | Source: Ambulatory Visit | Attending: Radiation Oncology | Admitting: Radiation Oncology

## 2017-03-05 DIAGNOSIS — Z170 Estrogen receptor positive status [ER+]: Secondary | ICD-10-CM | POA: Diagnosis not present

## 2017-03-05 DIAGNOSIS — C50512 Malignant neoplasm of lower-outer quadrant of left female breast: Secondary | ICD-10-CM | POA: Diagnosis not present

## 2017-03-05 DIAGNOSIS — Z510 Encounter for antineoplastic radiation therapy: Secondary | ICD-10-CM | POA: Diagnosis not present

## 2017-03-05 DIAGNOSIS — C50511 Malignant neoplasm of lower-outer quadrant of right female breast: Secondary | ICD-10-CM | POA: Diagnosis not present

## 2017-03-10 ENCOUNTER — Encounter: Payer: Self-pay | Admitting: Oncology

## 2017-03-10 ENCOUNTER — Encounter: Payer: Self-pay | Admitting: Radiation Oncology

## 2017-03-10 NOTE — Progress Notes (Signed)
  Radiation Oncology         [phone]) [phone] ________________________________  Name: [name] MRN: [id number]  Date: 03/10/2017  DOB: 05-Aug-1955  End of Treatment Note  Diagnosis:   Invasive ductal carcinoma of the LOQ of the right breast (G1, pT1c, pN0, pMX), ER/PR(+), Her2 (-), Oncotype score: 18      Indication for treatment:  Curative       Radiation treatment dates:   02/05/2017-03/05/2017  Site/dose:   1. Right breast, 2.67 Gy in 15 fractions for a total dose of 40.05 Gy           2. Right breast boost, 2 Gy in 5 fractions for a total dose of 10 Gy   Beams/energy:   1. 3D, 6X        2. Electron, 12E  Narrative: The patient tolerated radiation treatment relatively well. At the beginning of treatment, pt reported right breast soreness and mild fatigue. Towards the end of treatment, pt reported sharp shooting right breast pain, mild fatigue, and some mild stinging localized to the radiation field. Pt developed radiation related skin changes of mild hyperpigmentation to her right breast. Pt was instructed to use Radiaplex and Alra for her mild hyperpigmentation changes.    Plan: The patient has completed radiation treatment. The patient will return to radiation oncology clinic for routine followup in one month. I advised them to call or return sooner if they have any questions or concerns related to their recovery or treatment.  -----------------------------------  Blair Promise, PhD, MD   This document serves as a record of services personally performed by Gery Pray, MD. It was created on his behalf by [facility], a trained medical scribe. The creation of this record is based on the scribe's personal observations and the provider's statements to them. This document has been checked and approved by the attending provider.

## 2017-03-11 NOTE — Progress Notes (Signed)
Cardiology Office Note   Date:  03/13/2017   ID:  Haniah, Penny 09/26/1955, MRN [id number]  PCP:  Shanon Rosser, PA-C  Cardiologist:   Minus Breeding, MD    Chief Complaint  Patient presents with  . Follow-up     History of Present Illness: [name] is a 62 y.o. female who presents for follow up of palpitations.  I saw her last in 2017.   She has had palpitations in the past and reports a treadmill test years ago in [place].   She has a history of DVT/PE and IVC filter several years ago.  I saw her for evaluation of the palpitations.  She has had treatment for Grave's disease.  Since I last saw her she had treatment for breast cancer.  She had radiation and lumpectomy and is going to have hormone therapy.  She has been having some right leg discomfort.  She thinks this is slightly reminiscent of her previous DVT.  It tends to come and go.  It is a soreness in her side of her calf.  It is not reproducible.  She is not having any chest pressure, neck or arm discomfort.  She occasionally gets palpitations.  However, these are not severe.  She is not having any presyncope or syncope.   Past Medical History:  Diagnosis Date  . Anxiety   . Arthritis    neck  . Breast cancer ([place])   . Cancer [facility])    right breast cancer  . DVT (deep venous thrombosis) ([place])    04/2010  . GERD (gastroesophageal reflux disease)   . Headache(784.0) 09/22/2012  . Heart murmur    dx in the 90s - said that it comes and goes - cardiologist said it was a mild murmur  . History of hiatal hernia   . Hyperlipemia   . Hyperlipidemia   . Hypertension   . Hyperthyroidism   . Iron deficiency anemia   . Palpitations   . Pulmonary embolism ([place])   . Uterine fibroid   . Vitamin D deficiency    Graves disease    Past Surgical History:  Procedure Laterality Date  . BREAST LUMPECTOMY WITH RADIOACTIVE SEED AND SENTINEL LYMPH NODE BIOPSY Right 12/16/2016   Procedure: RIGHT BREAST LUMPECTOMY  WITH RADIOACTIVE SEED AND SENTINEL LYMPH NODE BIOPSY;  Surgeon: Rolm Bookbinder, MD;  Location: [facility];  Service: General;  Laterality: Right;  . CHOLECYSTECTOMY    . COLONOSCOPY    . DILATION AND CURETTAGE OF UTERUS     01/2008  . ESOPHAGOGASTRODUODENOSCOPY    . ivc filter       Current Outpatient Medications  Medication Sig Dispense Refill  . Ascorbic Acid (VITAMIN C) 1000 MG tablet Take 1,000 mg by mouth daily.    Marland Kitchen aspirin 81 MG tablet Take 81 mg by mouth every evening.     Marland Kitchen atorvastatin (LIPITOR) 20 MG tablet Take 20 mg by mouth daily at 6 PM.     . CALCIUM-VITAMIN D PO Take 1 tablet by mouth daily.    . Camphor-Eucalyptus-Menthol (VICKS VAPORUB EX) Apply 1 application topically daily as needed (itching).    . candesartan (ATACAND) 4 MG tablet Take 4 mg by mouth every evening.     . Cholecalciferol (VITAMIN D PO) Take 1 tablet by mouth daily.    Marland Kitchen LORazepam (ATIVAN) 0.5 MG tablet Take 0.5 mg by mouth daily as needed for anxiety.   0  . Melatonin 10 MG TABS Take  5 mg by mouth at bedtime.     . methimazole (TAPAZOLE) 10 MG tablet Take 5 mg by mouth daily.     . Multiple Vitamin (MULTIVITAMIN WITH MINERALS) TABS tablet Take 1 tablet by mouth daily.    Marland Kitchen neomycin-bacitracin-polymyxin (NEOSPORIN) ointment Apply 1 application topically 2 (two) times daily as needed for wound care. apply to eye    . Omega-3 Fatty Acids (FISH OIL) 1200 MG CAPS Take 1 capsule by mouth daily.    Vladimir Faster Glycol-Propyl Glycol (SYSTANE OP) Apply 1 drop to eye daily as needed (dry eyes).    . SALINE NASAL MIST NA Place 1-2 sprays into the nose daily as needed (dryness).     No current facility-administered medications for this visit.     Allergies:   Amoxicillin-pot clavulanate and Niacin-lovastatin er    ROS:  Please see the history of present illness.   Otherwise, review of systems are positive for none.   All other systems are reviewed and negative.    PHYSICAL EXAM: VS:  BP 133/86   Pulse 87    Ht 5\' 6"  (1.676 m)   Wt 188 lb (85.3 kg)   LMP 06/18/2010   BMI 30.34 kg/m  , BMI Body mass index is 30.34 kg/m.  GENERAL:  Well appearing NECK:  No jugular venous distention, waveform within normal limits, carotid upstroke brisk and symmetric, no bruits, no thyromegaly LUNGS:  Clear to auscultation bilaterally CHEST:  Unremarkable HEART:  PMI not displaced or sustained,S1 and S2 within normal limits, no S3, no S4, no clicks, no rubs, no murmurs ABD:  Flat, positive bowel sounds normal in frequency in pitch, no bruits, no rebound, no guarding, no midline pulsatile mass, no hepatomegaly, no splenomegaly EXT:  2 plus pulses throughout, no edema, no cyanosis no clubbing    EKG:  EKG is ordered today. The ekg ordered today demonstrates sinus rhythm, rate 87, axis within normal limits, intervals within normal limits, are surprisingly 1, nonspecific anterior T-wave flattening.   Recent Labs: 11/20/2016: ALT 15 12/11/2016: BUN 11; Creatinine, Ser 0.82; Hemoglobin 12.8; Platelets 162; Potassium 4.1; Sodium 139    Lipid Panel    Component Value Date/Time   CHOL 171 04/06/2010 1037   TRIG 82 04/06/2010 1037   HDL 39 (L) 04/06/2010 1037   CHOLHDL 4.4 04/06/2010 1037   VLDL 16 04/06/2010 1037   LDLCALC 116 (H) 04/06/2010 1037      Wt Readings from Last 3 Encounters:  03/13/17 188 lb (85.3 kg)  01/13/17 184 lb (83.5 kg)  01/13/17 183 lb 9.6 oz (83.3 kg)      Other studies Reviewed: Additional studies/ records that were reviewed today include:    None. Review of the above records demonstrates:  Please see elsewhere in the note.     ASSESSMENT AND PLAN:   Palpitations: These are not particularly severe.  No change in therapy is indicated.  Murmur:  She had a normal echo in 2018.  No further work up.    HTN:   Her blood pressure is upper limits of normal and she will keep an eye on this.  No change in therapy. ith therapeutic lifestyle changes (TLC).  IVC: She has some leg  pain   she has had a previous DVT.  Given this I like to screen with right leg venous Doppler   Dyslipidemia: She will come back for fasting lipid profile enzymes.  Current medicines are reviewed at length with the patient today.  The patient  does not have concerns regarding medicines.  The following changes have been made:   None  Labs/ tests ordered today include:     Orders Placed This Encounter  Procedures  . Lipid panel  . Hepatic function panel  . EKG 12-Lead     Disposition:   FU with me in 12 months. Ronnell Guadalajara, MD  03/13/2017 9:10 AM    [place]

## 2017-03-12 ENCOUNTER — Telehealth: Payer: Self-pay | Admitting: Oncology

## 2017-03-13 ENCOUNTER — Encounter: Payer: Self-pay | Admitting: Cardiology

## 2017-03-13 ENCOUNTER — Ambulatory Visit: Payer: Federal, State, Local not specified - PPO | Admitting: Cardiology

## 2017-03-13 VITALS — BP 133/86 | HR 87 | Ht 66.0 in | Wt 188.0 lb

## 2017-03-13 DIAGNOSIS — E785 Hyperlipidemia, unspecified: Secondary | ICD-10-CM

## 2017-03-13 DIAGNOSIS — Z79899 Other long term (current) drug therapy: Secondary | ICD-10-CM | POA: Diagnosis not present

## 2017-03-13 DIAGNOSIS — M79604 Pain in right leg: Secondary | ICD-10-CM | POA: Diagnosis not present

## 2017-03-13 NOTE — Patient Instructions (Signed)
Medication Instructions:  Continue current medications  If you need a refill on your cardiac medications before your next appointment, please call your pharmacy.  Labwork: Fasting Lipid Liver HERE IN OUR OFFICE AT [facility]  Take the provided lab slips for you to take with you to the lab for you blood draw.   You will need to fast. DO NOT EAT OR DRINK PAST MIDNIGHT.    You may go to any [facility] lab that is convenient for you however, we do have a lab in our office that is able to assist you. You do NOT need an appointment for our lab. Once in our office lobby there is a podium to the right of the check-in desk where you are to sign-in and ring a doorbell to alert [place] you are here. Lab is open Monday-Friday from 8:00am to 4:00pm; and is closed for lunch from 12:45p-1:45pm   Testing/Procedures: Your physician has requested that you have a lower extremity venous duplex. This test is an ultrasound of the veins in the legs. It looks at venous blood flow that carries blood from the heart to the legs. Allow one hour for a Lower Venous exam. Allow thirty minutes for an Upper Venous exam. There are no restrictions or special instructions.  Follow-Up: Your physician wants you to follow-up in: 1 Year. You should receive a reminder letter in the mail two months in advance. If you do not receive a letter, please call our office [phone].    Thank you for choosing [facility]!!

## 2017-03-17 ENCOUNTER — Encounter: Payer: Self-pay | Admitting: Radiation Oncology

## 2017-03-17 NOTE — Progress Notes (Signed)
[facility]  [phone] [phone]     ID: [name] DOB: 1955-06-30  MR#: [id number]  WEX#:[id number]  Patient Care Team: Shanon Rosser, PA-C as PCP - General (Physician Assistant) Rolm Bookbinder, MD as Consulting Physician (General Surgery) Jaran Sainz, Virgie Dad, MD as Consulting Physician (Oncology) Gery Pray, MD as Consulting Physician (Radiation Oncology) OTHER MD:  CHIEF COMPLAINT: Estrogen receptor positive breast cancer  CURRENT TREATMENT: anastrozole  HISTORY OF CURRENT ILLNESS: From the original intake note:  [name] had routine bilateral screening mammography with tomography at the [facility] 10/08/2016. An area of possible distortion in the right breast was noted. She was recalled for right diagnostic mammography with tomography on 11/08/2016. This found the breast density to be category C. The small area of architectural distortion in the lateral right breast persisted and on 11/11/2016 biopsy of this area showed ([id number]) invasive ductal carcinoma, E-cadherin positive, estrogen receptor 95% positive with strong staining intensity, progesterone receptor 5% positive, with strong staining intensity, with an MIB-1 of 10%, and no HER-2 amplification, with a signals ratio of 1.51 and number per cell 3.40. Right axillary ultrasound 11/13/2016 was sonographically benign.  Of note, she has a history of DVT diagnosed March 2010, felt to be possibly related to estrogen replacement therapy. She was coumadinized but developed a pulmonary embolus while on Coumadin. She was then had an IVC filter placed and was anticoagulated with Lovenox for 2 years. An extensive hypercoagulable workup was negative except for an elevated factor VIII level. Her IVC filter is still in place and the patient tells me she is participating in a lawsuit regarding that.   The patient's subsequent history is as detailed below.  INTERVAL HISTORY: [name] returns  today for follow up and treatment of her estrogen receptor positive breast cancer. She completed radiation treatments on 03/05/2017. She notes that she has some skin darkening and fatigue.  She is also having some cognitive dysfunction which she thinks is going to keep her from going back to work for several weeks at least.   REVIEW OF SYSTEMS: [name] reports that she is taking time off because her job at the [employer] is stressful. She notes that she would like about 3 months off to recover. She notes that 7 years ago, she had a DVT in the right leg, and she is continuing to see her cardiologist. She notes that her blood pressure was elevated. She notes that she has some anxiety. She notes that she was a member of [employer], but she is thinking about reinstating her membership when she doesn't feel as tired. She notes that the back of her neck is sore. She notes that she had sinus congestion and felt fluid in her chest. She notes that her right leg feel sore at times. She denies unusual headaches, visual changes, nausea, vomiting, or dizziness. There has been no unusual cough, phlegm production, or pleurisy. This been no change in bowel or bladder habits. She denies unexplained fatigue or unexplained weight loss, bleeding, rash, or fever. A detailed review of systems was otherwise stable.    PAST MEDICAL HISTORY: Past Medical History:  Diagnosis Date  . Anxiety   . Arthritis    neck  . Breast cancer ([place])   . Cancer [facility])    right breast cancer  . DVT (deep venous thrombosis) ([place])    04/2010  . GERD (gastroesophageal reflux disease)   . Headache(784.0) 09/22/2012  . Heart murmur    dx in the 90s -  said that it comes and goes - cardiologist said it was a mild murmur  . History of hiatal hernia   . Hyperlipemia   . Hyperlipidemia   . Hypertension   . Hyperthyroidism   . Iron deficiency anemia   . Palpitations   . Pulmonary embolism ([place])   . Uterine fibroid   . Vitamin D deficiency     Graves disease    PAST SURGICAL HISTORY: Past Surgical History:  Procedure Laterality Date  . BREAST LUMPECTOMY WITH RADIOACTIVE SEED AND SENTINEL LYMPH NODE BIOPSY Right 12/16/2016   Procedure: RIGHT BREAST LUMPECTOMY WITH RADIOACTIVE SEED AND SENTINEL LYMPH NODE BIOPSY;  Surgeon: Rolm Bookbinder, MD;  Location: [facility];  Service: General;  Laterality: Right;  . CHOLECYSTECTOMY    . COLONOSCOPY    . DILATION AND CURETTAGE OF UTERUS     01/2008  . ESOPHAGOGASTRODUODENOSCOPY    . ivc filter      FAMILY HISTORY Family History  Problem Relation Age of Onset  . Drug abuse Brother   . Hypertension Mother   . Colon cancer Maternal Grandfather   She notes that her father died at age 37 from an accidental head injury.The patient's mother is 23 years old as of October 2018. Pt has one brother and no sisters. Pt reports that a second cousin has a hx of breast cancer and was dx in her 39's. Pt denies family hx of ovarian cancer.   GYNECOLOGIC HISTORY:  Patient's last menstrual period was 06/18/2010. Menarche: 62 years old Age at first live birth: No children GP: GXP0 LMP: March 2016 Contraceptive: OCP on and off for many years d/c after DVT and PE diagnosis.  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 She denies having pets at home.     ADVANCED DIRECTIVES: Not in place. At the 11/20/2016 visit the patient was given the appropriate documents to complete and notarize at her discretion   HEALTH MAINTENANCE: Social History   Tobacco Use  . Smoking status: Former Smoker    Packs/day: 0.40    Years: 10.00    Pack years: 4.00    Types: Cigarettes    Last attempt to quit: 06/01/2002    Years since quitting: 14.8  . Smokeless tobacco: Never Used  Substance Use Topics  . Alcohol use: Yes    Comment: occasional 1 drink per month  . Drug use: No     Colonoscopy: 2013  PAP: 08/30/2016   Bone density: 10/04/2015 with T-score of -0.3  at femur neck right   Allergies  Allergen Reactions  . Amoxicillin-Pot Clavulanate Other (See Comments)    dizziness  . Niacin-Lovastatin Er Other (See Comments)    flushing    Current Outpatient Medications  Medication Sig Dispense Refill  . Ascorbic Acid (VITAMIN C) 1000 MG tablet Take 1,000 mg by mouth daily.    Marland Kitchen aspirin 81 MG tablet Take 81 mg by mouth every evening.     Marland Kitchen atorvastatin (LIPITOR) 20 MG tablet Take 20 mg by mouth daily at 6 PM.     . CALCIUM-VITAMIN D PO Take 1 tablet by mouth daily.    . Camphor-Eucalyptus-Menthol (VICKS VAPORUB EX) Apply 1 application topically daily as needed (itching).    . candesartan (ATACAND) 4 MG tablet Take 4 mg by mouth every evening.     . Cholecalciferol (VITAMIN D PO) Take 1 tablet by mouth daily.    Marland Kitchen LORazepam (ATIVAN)  0.5 MG tablet Take 0.5 mg by mouth daily as needed for anxiety.   0  . Melatonin 10 MG TABS Take 5 mg by mouth at bedtime.     . methimazole (TAPAZOLE) 10 MG tablet Take 5 mg by mouth daily.     . Multiple Vitamin (MULTIVITAMIN WITH MINERALS) TABS tablet Take 1 tablet by mouth daily.    Marland Kitchen neomycin-bacitracin-polymyxin (NEOSPORIN) ointment Apply 1 application topically 2 (two) times daily as needed for wound care. apply to eye    . Omega-3 Fatty Acids (FISH OIL) 1200 MG CAPS Take 1 capsule by mouth daily.    Vladimir Faster Glycol-Propyl Glycol (SYSTANE OP) Apply 1 drop to eye daily as needed (dry eyes).    . SALINE NASAL MIST NA Place 1-2 sprays into the nose daily as needed (dryness).     No current facility-administered medications for this visit.     OBJECTIVE: Middle-aged African-American woman who appears stated age  26:   03/19/17 1040  BP: 133/90  Pulse: 72  Resp: 18  Temp: 98 F (36.7 C)  SpO2: 98%     Body mass index is 30.02 kg/m.   Wt Readings from Last 3 Encounters:  03/19/17 186 lb (84.4 kg)  03/13/17 188 lb (85.3 kg)  01/13/17 184 lb (83.5 kg)      ECOG FS:1 - Symptomatic but completely  ambulatory  Ocular: Sclerae unicteric, pupils round and equal Ear-nose-throat: Oropharynx clear and moist Lymphatic: No cervical or supraclavicular adenopathy Lungs no rales or rhonchi Heart regular rate and rhythm Abd soft, nontender, positive bowel sounds MSK no focal spinal tenderness, no joint edema Neuro: non-focal, well-oriented, appropriate affect Breasts: The right breast is status post lumpectomy and radiation.  There is moderate hyperpigmentation.  The cosmetic result is good.  Left breast is unremarkable.  Both axillae are benign.  LAB RESULTS:  CMP     Component Value Date/Time   NA 139 12/11/2016 0937   NA 141 11/20/2016 0822   K 4.1 12/11/2016 0937   K 3.6 11/20/2016 0822   CL 108 12/11/2016 0937   CL 105 06/24/2012 1548   CO2 25 12/11/2016 0937   CO2 24 11/20/2016 0822   GLUCOSE 125 (H) 12/11/2016 0937   GLUCOSE 102 11/20/2016 0822   GLUCOSE 91 06/24/2012 1548   BUN 11 12/11/2016 0937   BUN 19.2 11/20/2016 0822   CREATININE 0.82 12/11/2016 0937   CREATININE 0.9 11/20/2016 0822   CALCIUM 9.3 12/11/2016 0937   CALCIUM 9.5 11/20/2016 0822   PROT 8.2 11/20/2016 0822   ALBUMIN 3.8 11/20/2016 0822   AST 15 11/20/2016 0822   ALT 15 11/20/2016 0822   ALKPHOS 122 11/20/2016 0822   BILITOT 0.52 11/20/2016 0822   GFRNONAA >60 12/11/2016 0937   GFRAA >60 12/11/2016 0937    No results found for: TOTALPROTELP, ALBUMINELP, A1GS, A2GS, BETS, BETA2SER, GAMS, MSPIKE, SPEI  No results found for: KPAFRELGTCHN, LAMBDASER, [facility]  Lab Results  Component Value Date   WBC 4.1 03/19/2017   NEUTROABS 2.6 03/19/2017   HGB 12.2 03/19/2017   HCT 36.7 03/19/2017   MCV 97.3 03/19/2017   PLT 146 03/19/2017    '@LASTCHEMISTRY' @  Lab Results  Component Value Date   LABCA2 17 04/26/2008    No components found for: CXKGYJ856  No results for input(s): INR in the last 168 hours.  Lab Results  Component Value Date   LABCA2 17 04/26/2008    No results found for:  DJS970  No results found  for: RCB638  No results found for: GTX646  No results found for: CA2729  No components found for: HGQUANT  No results found for: CEA1 / No results found for: CEA1   No results found for: AFPTUMOR  No results found for: [place]  No results found for: PSA1  Appointment on 03/19/2017  Component Date Value Ref Range Status  . WBC 03/19/2017 4.1  3.9 - 10.3 K/uL Final  . RBC 03/19/2017 3.78  3.70 - 5.45 MIL/uL Final  . Hemoglobin 03/19/2017 12.2  11.6 - 15.9 g/dL Final  . HCT 03/19/2017 36.7  34.8 - 46.6 % Final  . MCV 03/19/2017 97.3  79.5 - 101.0 fL Final  . MCH 03/19/2017 32.2  25.1 - 34.0 pg Final  . MCHC 03/19/2017 33.1  31.5 - 36.0 g/dL Final  . RDW 03/19/2017 13.5  11.2 - 14.5 % Final  . Platelets 03/19/2017 146  145 - 400 K/uL Final  . Neutrophils Relative % 03/19/2017 62  % Final  . Neutro Abs 03/19/2017 2.6  1.5 - 6.5 K/uL Final  . Lymphocytes Relative 03/19/2017 28  % Final  . Lymphs Abs 03/19/2017 1.1  0.9 - 3.3 K/uL Final  . Monocytes Relative 03/19/2017 6  % Final  . Monocytes Absolute 03/19/2017 0.2  0.1 - 0.9 K/uL Final  . Eosinophils Relative 03/19/2017 3  % Final  . Eosinophils Absolute 03/19/2017 0.1  0.0 - 0.5 K/uL Final  . Basophils Relative 03/19/2017 1  % Final  . Basophils Absolute 03/19/2017 0.0  0.0 - 0.1 K/uL Final   Performed at [facility], [place] [address] [place], [place] [address]    (this displays the last labs from the last 3 days)  No results found for: TOTALPROTELP, ALBUMINELP, A1GS, A2GS, BETS, BETA2SER, GAMS, MSPIKE, SPEI (this displays SPEP labs)  No results found for: KPAFRELGTCHN, LAMBDASER, KAPLAMBRATIO (kappa/lambda light chains)  No results found for: HGBA, HGBA2QUANT, HGBFQUANT, HGBSQUAN (Hemoglobinopathy evaluation)   No results found for: LDH  Lab Results  Component Value Date   IRON 61 12/25/2012   TIBC 331 12/25/2012   IRONPCTSAT 18 (L) 12/25/2012   (Iron and  TIBC)  Lab Results  Component Value Date   FERRITIN 31 12/25/2012    Urinalysis    Component Value Date/Time   COLORURINE LT YELLOW 04/18/2008 1220   APPEARANCEUR Clear 04/18/2008 1220   LABSPEC > OR = 1.030 04/18/2008 1220   PHURINE 5.5 04/18/2008 1220   GLUCOSEU NEGATIVE 04/18/2008 1220   BILIRUBINUR NEGATIVE 04/18/2008 1220   KETONESUR NEGATIVE 04/18/2008 1220   UROBILINOGEN 0.2 mg/dL 04/18/2008 1220   NITRITE Negative 04/18/2008 1220   LEUKOCYTESUR Negative 04/18/2008 1220     STUDIES: No results found.  ELIGIBLE FOR AVAILABLE RESEARCH PROTOCOL: no  ASSESSMENT: 62 y.o. [place] woman status post right breast lower outer quadrant biopsy 11/11/2016 for a clinical TX N), stage I invasive ductal carcinoma, grade 2, E-cadherin positive, estrogen receptor 95% positive, progesterone receptor 5% positive, with an MIB-1 of 10%, and no HER-2 amplification  (1) Status post right lumpectomy and right axillary sentinel lymph node sampling ([id number]) on 12/16/2016 with pathology showing: Invasive ducatal carcinoma grade I spanning 1.6 cm. Invasive ductal carcinoma is focaaly less than 0.1cm to the lateral/inferior margin. No evidence of malignancy in the breast excision of the right inferior margin. 3 right axillary lymph nodes negative for carcinoma (0/3).   (2) The Oncotype DX score was 18, predicting a risk of outside the breast recurrence over the  next 10 years of 11% if the patient's only systemic therapy is tamoxifen for 5 years.  It also predicts no benefit from chemotherapy.  (3) Adjuvant radiation 02/05/2017-03/05/2017 Site/dose:    1. Right breast, 2.67 Gy in 15 fractions for a total dose of 40.05 Gy                    2. Right breast boost, 2 Gy in 5 fractions for a total dose of 10 Gy   (4) anti-estrogens to start at the completion of local treatment.  (a) not a good tamoxifen candidate given problem #5  (5) history of DVT/PE March 2010, with negative extensive  hypercoagulable workup, IVC filter in place  PLAN: I spent approximately 40 minutes with [name] today going over her situation.  She has completed local treatment of her breast cancer and tolerated that well.  She is having some cognitive dysfunction which she feels is going to keep her from being able to function at work for at least some weeks.  I do not find this unusual.  It is a form of posttraumatic stress.  Generally it does improve with time but in some cases it may require antidepressants or other interventions.  She understands that she may have microscopic spread of disease which would have occurred before of course the cancer was removed.  We have no way to detect this.  I quoted her a 1 in 4 or 1 and 5 chance of that being the case.  For that reason she needs systemic therapy.  She is not a candidate for anti-HER-2 treatment.  Her Oncotype did not suggest any significant benefit from chemotherapy.  Accordingly her only systemic therapy will be antiestrogens.  She is not a good candidate for tamoxifen given her history of clots.  Accordingly we discussed anastrozole in detail today.  [name] has a good understanding of the possible toxicities, side effects and complications of this agent.  I have gone ahead and placed the prescription in for her.  I wrote her a note so that she may stay out of work until she feels a little bit more capable.  Otherwise I will see her again in 3 months.  If she is tolerating anastrozole well the plan will be to continue that for a total of 5 years.  I have encouraged her to enroll in finding your new normal.  She knows to call for any other issues that may develop before then.   Dior Dominik, Virgie Dad, MD  03/19/17 11:08 AM Medical Oncology and Hematology [facility] [address] [place], [place] [address] Tel. [phone]    Fax. [phone]  This document serves as a record of services personally performed by Lurline Del, MD.  It was created on his behalf by Sheron Nightingale, a trained medical scribe. The creation of this record is based on the scribe's personal observations and the provider's statements to them.   I have reviewed the above documentation for accuracy and completeness, and I agree with the above.

## 2017-03-17 NOTE — Progress Notes (Signed)
Rec'd back from Santiago Glad, RN, pt FMLA form completed by Dr. Sondra Come. I have called pt and she will p/u tomorrow since she will see Dr. Jana Hakim. She also wanted a dr. Note to excuse her from work as she states that she has not been able to return to work after her last rad tx (1/23) due to fatigue. I have inbasket Dr. Sondra Come to see if he can do this for her.

## 2017-03-18 ENCOUNTER — Encounter: Payer: Self-pay | Admitting: Oncology

## 2017-03-18 ENCOUNTER — Encounter: Payer: Self-pay | Admitting: Radiation Oncology

## 2017-03-18 NOTE — Progress Notes (Signed)
I called Ms. Yarbough in regards to the note that Dr. Sondra Come has signed for her for work release until the end of April. She agreed to have it for her to p/u tomorrow at our front desk since she will be her for Dr. Jana Hakim.

## 2017-03-19 ENCOUNTER — Inpatient Hospital Stay: Payer: Federal, State, Local not specified - PPO | Attending: Oncology

## 2017-03-19 ENCOUNTER — Inpatient Hospital Stay: Payer: Federal, State, Local not specified - PPO | Admitting: Oncology

## 2017-03-19 ENCOUNTER — Telehealth: Payer: Self-pay | Admitting: Oncology

## 2017-03-19 VITALS — BP 133/90 | HR 72 | Temp 98.0°F | Resp 18 | Ht 66.0 in | Wt 186.0 lb

## 2017-03-19 DIAGNOSIS — C773 Secondary and unspecified malignant neoplasm of axilla and upper limb lymph nodes: Secondary | ICD-10-CM | POA: Insufficient documentation

## 2017-03-19 DIAGNOSIS — Z170 Estrogen receptor positive status [ER+]: Secondary | ICD-10-CM

## 2017-03-19 DIAGNOSIS — D508 Other iron deficiency anemias: Secondary | ICD-10-CM | POA: Diagnosis not present

## 2017-03-19 DIAGNOSIS — Z86711 Personal history of pulmonary embolism: Secondary | ICD-10-CM

## 2017-03-19 DIAGNOSIS — Z86718 Personal history of other venous thrombosis and embolism: Secondary | ICD-10-CM | POA: Diagnosis not present

## 2017-03-19 DIAGNOSIS — C50511 Malignant neoplasm of lower-outer quadrant of right female breast: Secondary | ICD-10-CM | POA: Diagnosis not present

## 2017-03-19 DIAGNOSIS — Z7901 Long term (current) use of anticoagulants: Secondary | ICD-10-CM | POA: Insufficient documentation

## 2017-03-19 DIAGNOSIS — I2699 Other pulmonary embolism without acute cor pulmonale: Secondary | ICD-10-CM

## 2017-03-19 LAB — COMPREHENSIVE METABOLIC PANEL
ALBUMIN: 3.7 g/dL (ref 3.5–5.0)
ALK PHOS: 128 U/L (ref 40–150)
ALT: 10 U/L (ref 0–55)
AST: 13 U/L (ref 5–34)
Anion gap: 7 (ref 3–11)
BUN: 15 mg/dL (ref 7–26)
CALCIUM: 9.3 mg/dL (ref 8.4–10.4)
CHLORIDE: 109 mmol/L (ref 98–109)
CO2: 25 mmol/L (ref 22–29)
CREATININE: 0.85 mg/dL (ref 0.60–1.10)
GFR calc Af Amer: >60 mL/min (ref >60)
GFR calc non Af Amer: >60 mL/min (ref >60)
GLUCOSE: 103 mg/dL (ref 70–140)
Potassium: 3.9 mmol/L (ref 3.5–5.1)
SODIUM: 141 mmol/L (ref 136–145)
Total Bilirubin: 0.4 mg/dL (ref 0.2–1.2)
Total Protein: 7.9 g/dL (ref 6.4–8.3)

## 2017-03-19 LAB — CBC WITH DIFFERENTIAL/PLATELET
BASOS PCT: 1 %
Basophils Absolute: 0 10*3/uL (ref 0.0–0.1)
EOS ABS: 0.1 10*3/uL (ref 0.0–0.5)
Eosinophils Relative: 3 %
HCT: 36.7 % (ref 34.8–46.6)
Hemoglobin: 12.2 g/dL (ref 11.6–15.9)
LYMPHS ABS: 1.1 10*3/uL (ref 0.9–3.3)
Lymphocytes Relative: 28 %
MCH: 32.2 pg (ref 25.1–34.0)
MCHC: 33.1 g/dL (ref 31.5–36.0)
MCV: 97.3 fL (ref 79.5–101.0)
MONO ABS: 0.2 10*3/uL (ref 0.1–0.9)
MONOS PCT: 6 %
Neutro Abs: 2.6 10*3/uL (ref 1.5–6.5)
Neutrophils Relative %: 62 %
Platelets: 146 10*3/uL (ref 145–400)
RBC: 3.78 MIL/uL (ref 3.70–5.45)
RDW: 13.5 % (ref 11.2–14.5)
WBC: 4.1 10*3/uL (ref 3.9–10.3)

## 2017-03-19 MED ORDER — ANASTROZOLE 1 MG PO TABS: 1.0000 mg | ORAL_TABLET | Freq: Every day | ORAL | 4 refills | Status: DC

## 2017-03-19 NOTE — Telephone Encounter (Signed)
Gave patient AVs and calendar of upcoming May appointments.  °

## 2017-03-21 ENCOUNTER — Ambulatory Visit (HOSPITAL_COMMUNITY)
Admission: RE | Admit: 2017-03-21 | Discharge: 2017-03-21 | Disposition: A | Discharge status: Home / Self Care | Payer: Federal, State, Local not specified - PPO | Source: Ambulatory Visit | Attending: Cardiovascular Disease | Admitting: Cardiovascular Disease

## 2017-03-21 ENCOUNTER — Ambulatory Visit (INDEPENDENT_AMBULATORY_CARE_PROVIDER_SITE_OTHER): Payer: Federal, State, Local not specified - PPO | Admitting: Cardiology

## 2017-03-21 ENCOUNTER — Encounter: Payer: Self-pay | Admitting: Cardiology

## 2017-03-21 ENCOUNTER — Telehealth: Payer: Self-pay | Admitting: Oncology

## 2017-03-21 VITALS — BP 142/87 | HR 67 | Ht 66.0 in | Wt 184.6 lb

## 2017-03-21 DIAGNOSIS — M79604 Pain in right leg: Secondary | ICD-10-CM | POA: Insufficient documentation

## 2017-03-21 DIAGNOSIS — R079 Chest pain, unspecified: Secondary | ICD-10-CM | POA: Diagnosis not present

## 2017-03-21 DIAGNOSIS — Z79899 Other long term (current) drug therapy: Secondary | ICD-10-CM | POA: Diagnosis not present

## 2017-03-21 DIAGNOSIS — E785 Hyperlipidemia, unspecified: Secondary | ICD-10-CM | POA: Diagnosis not present

## 2017-03-21 LAB — LIPID PANEL
CHOLESTEROL TOTAL: 152 mg/dL (ref 100–199)
Chol/HDL Ratio: 3.1 ratio (ref 0.0–4.4)
HDL: 49 mg/dL (ref >39)
LDL CALC: 90 mg/dL (ref 0–99)
TRIGLYCERIDES: 63 mg/dL (ref 0–149)
VLDL Cholesterol Cal: 13 mg/dL (ref 5–40)

## 2017-03-21 LAB — HEPATIC FUNCTION PANEL
ALK PHOS: 129 IU/L — AB (ref 39–117)
ALT: 12 IU/L (ref 0–32)
AST: 10 IU/L (ref 0–40)
Albumin: 4.3 g/dL (ref 3.5–5.5)
BILIRUBIN, DIRECT: 0.16 mg/dL (ref 0.00–0.40)
Bilirubin Total: 0.5 mg/dL (ref 0.0–1.2)
Total Protein: 7.9 g/dL (ref 6.0–8.5)

## 2017-03-21 MED ORDER — RIVAROXABAN 20 MG PO TABS: 20.0000 mg | ORAL_TABLET | Freq: Every day | ORAL | 11 refills | Status: DC

## 2017-03-21 NOTE — Patient Instructions (Signed)
Medication Instructions:  START- Xarelto 20 mg daily  If you need a refill on your cardiac medications before your next appointment, please call your pharmacy.  Labwork: None Ordered   Testing/Procedures: None Ordered  Follow-Up: Your physician wants you to follow-up in: 3 Months.    Thank you for choosing [facility] at [facility]!!

## 2017-03-21 NOTE — Progress Notes (Addendum)
Cardiology Office Note   Date:  03/21/2017   ID:  Vear, Staton [birth year], MRN [id number]  PCP:  Shanon Rosser, PA-C  Cardiologist:   Minus Breeding, MD    Chief Complaint  Patient presents with  . DVT     History of Present Illness: [name] is a 62 y.o. female who presents for follow up of palpitations.  I saw her last in Jan.   She has had palpitations in the past and reports a treadmill test years ago in [place].   She has a history of DVT/PE and IVC filter several years ago.  I saw her for evaluation of the palpitations.  She has had treatment for Grave's disease.  Since I last saw her she had treatment for breast cancer.  She had radiation and lumpectomy and is going to have hormone therapy.  At the last visit she was having some leg discomfort and I sent her for a Doppler.  She had chronic evidence of clot with some chronic venous changes and there was a possibility of some subacute clot as well.  She has no new symptoms.  She has had some sporadic chest discomfort.  She is had some soreness in her left calf.  She has not had any new shortness of breath, PND orthopnea.  There is been no new palpitations, presyncope or syncope.  He has had no new weight gain or edema.   Past Medical History:  Diagnosis Date  . Anxiety   . Arthritis    neck  . Breast cancer ([place])   . Cancer [facility])    right breast cancer  . DVT (deep venous thrombosis) ([place])    04/2010  . GERD (gastroesophageal reflux disease)   . Headache(784.0) 09/22/2012  . Heart murmur    dx in the 90s - said that it comes and goes - cardiologist said it was a mild murmur  . History of hiatal hernia   . Hyperlipemia   . Hyperlipidemia   . Hypertension   . Hyperthyroidism   . Iron deficiency anemia   . Palpitations   . Pulmonary embolism ([place])   . Uterine fibroid   . Vitamin D deficiency    Graves disease    Past Surgical History:  Procedure Laterality Date  . BREAST LUMPECTOMY WITH  RADIOACTIVE SEED AND SENTINEL LYMPH NODE BIOPSY Right 12/16/2016   Procedure: RIGHT BREAST LUMPECTOMY WITH RADIOACTIVE SEED AND SENTINEL LYMPH NODE BIOPSY;  Surgeon: Rolm Bookbinder, MD;  Location: [place];  Service: General;  Laterality: Right;  . CHOLECYSTECTOMY    . COLONOSCOPY    . DILATION AND CURETTAGE OF UTERUS     01/2008  . ESOPHAGOGASTRODUODENOSCOPY    . ivc filter       Current Outpatient Medications  Medication Sig Dispense Refill  . anastrozole (ARIMIDEX) 1 MG tablet Take 1 tablet (1 mg total) by mouth daily. 90 tablet 4  . Ascorbic Acid (VITAMIN C) 1000 MG tablet Take 1,000 mg by mouth daily.    Marland Kitchen aspirin 81 MG tablet Take 81 mg by mouth every evening.     Marland Kitchen atorvastatin (LIPITOR) 20 MG tablet Take 20 mg by mouth daily at 6 PM.     . CALCIUM-VITAMIN D PO Take 1 tablet by mouth daily.    . Camphor-Eucalyptus-Menthol (VICKS VAPORUB EX) Apply 1 application topically daily as needed (itching).    . candesartan (ATACAND) 4 MG tablet Take 4 mg by mouth every evening.     Marland Kitchen  Cholecalciferol (VITAMIN D PO) Take 1 tablet by mouth daily.    Marland Kitchen LORazepam (ATIVAN) 0.5 MG tablet Take 0.5 mg by mouth daily as needed for anxiety.   0  . Melatonin 10 MG TABS Take 5 mg by mouth at bedtime.     . methimazole (TAPAZOLE) 10 MG tablet Take 5 mg by mouth daily.     . Multiple Vitamin (MULTIVITAMIN WITH MINERALS) TABS tablet Take 1 tablet by mouth daily.    Marland Kitchen neomycin-bacitracin-polymyxin (NEOSPORIN) ointment Apply 1 application topically 2 (two) times daily as needed for wound care. apply to eye    . Omega-3 Fatty Acids (FISH OIL) 1200 MG CAPS Take 1 capsule by mouth daily.    Vladimir Faster Glycol-Propyl Glycol (SYSTANE OP) Apply 1 drop to eye daily as needed (dry eyes).    . SALINE NASAL MIST NA Place 1-2 sprays into the nose daily as needed (dryness).    . rivaroxaban (XARELTO) 20 MG TABS tablet Take 1 tablet (20 mg total) by mouth daily with supper. 30 tablet 11   No current  facility-administered medications for this visit.     Allergies:   Amoxicillin-pot clavulanate and Niacin-lovastatin er    ROS:  Please see the history of present illness.   Otherwise, review of systems are positive for none.   All other systems are reviewed and negative.    PHYSICAL EXAM: VS:  BP (!) 142/87   Pulse 67   Ht 5\' 6"  (1.676 m)   Wt 184 lb 9.6 oz (83.7 kg)   LMP 06/18/2010   BMI 29.80 kg/m  , BMI Body mass index is 29.8 kg/m.  GENERAL:  Well appearing NECK:  No jugular venous distention, waveform within normal limits, carotid upstroke brisk and symmetric, no bruits, no thyromegaly LUNGS:  Clear to auscultation bilaterally CHEST:  Unremarkable HEART:  PMI not displaced or sustained,S1 and S2 within normal limits, no S3, no S4, no clicks, no rubs, no murmurs ABD:  Flat, positive bowel sounds normal in frequency in pitch, no bruits, no rebound, no guarding, no midline pulsatile mass, no hepatomegaly, no splenomegaly EXT:  2 plus pulses throughout, no edema, no cyanosis no clubbing   EKG:  EKG is not ordered today.   Recent Labs: 03/19/2017: ALT 10; BUN 15; Creatinine, Ser 0.85; Hemoglobin 12.2; Platelets 146; Potassium 3.9; Sodium 141    Lipid Panel    Component Value Date/Time   CHOL 171 04/06/2010 1037   TRIG 82 04/06/2010 1037   HDL 39 (L) 04/06/2010 1037   CHOLHDL 4.4 04/06/2010 1037   VLDL 16 04/06/2010 1037   LDLCALC 116 (H) 04/06/2010 1037      Wt Readings from Last 3 Encounters:  03/21/17 184 lb 9.6 oz (83.7 kg)  03/19/17 186 lb (84.4 kg)  03/13/17 188 lb (85.3 kg)      Other studies Reviewed: Additional studies/ records that were reviewed today include:    Doppler. Review of the above records demonstrates:     ASSESSMENT AND PLAN:    Murmur:  She had a normal echo in 2018.  No further work up.    HTN:   Her blood pressure is upper limits of normal.  We are treating this with therapeutic lifestyle changes.    DVT: It is impossible to  tell whether this is new clot or old.  At this point I am going to assume that this is new. Given her past history going to start anticoagulation.  Start with Xarelto.  She  has no bleeding issues and normal labs.  I will reevaluate the duration of this at 3 month follow up.  She and I had a risk benefit discussion an she has difficulty understanding some concepts about chronic or acute clot but she I believe has been able to participate in understanding and agree to proceed with anticoagulation.   Dyslipidemia:   She had lipids drawn today and I will follow this.    She will come back for fasting lipid profile enzymes.  Current medicines are reviewed at length with the patient today.  The patient does not have concerns regarding medicines.  The following changes have been made:   As above    Labs/ tests ordered today include:     No orders of the defined types were placed in this encounter.    Disposition:   FU with me in 3 months. Ronnell Guadalajara, MD  03/21/2017 12:53 PM    [facility]

## 2017-03-21 NOTE — Telephone Encounter (Signed)
03/21/17 @ 2:58 pm spoke with patient to confirm Disability form was filled out and ready for pick up at front desk receptionist area.

## 2017-03-24 DIAGNOSIS — R350 Frequency of micturition: Secondary | ICD-10-CM | POA: Diagnosis not present

## 2017-03-26 ENCOUNTER — Telehealth: Payer: Self-pay | Admitting: Cardiology

## 2017-03-26 NOTE — Telephone Encounter (Signed)
Pt aware of lab work.

## 2017-03-26 NOTE — Telephone Encounter (Signed)
[name] is returning a call . If unable to reach her please leave a detailed message on her voiciemail

## 2017-04-04 ENCOUNTER — Encounter: Payer: Self-pay | Admitting: Oncology

## 2017-04-10 ENCOUNTER — Encounter: Payer: Self-pay | Admitting: Radiation Oncology

## 2017-04-10 ENCOUNTER — Telehealth: Payer: Self-pay | Admitting: Cardiology

## 2017-04-10 ENCOUNTER — Ambulatory Visit
Admission: RE | Admit: 2017-04-10 | Discharge: 2017-04-10 | Disposition: A | Discharge status: Home / Self Care | Payer: Federal, State, Local not specified - PPO | Source: Ambulatory Visit | Attending: Radiation Oncology | Admitting: Radiation Oncology

## 2017-04-10 VITALS — BP 132/88 | HR 82 | Temp 98.2°F | Resp 18 | Wt 184.1 lb

## 2017-04-10 DIAGNOSIS — Z170 Estrogen receptor positive status [ER+]: Principal | ICD-10-CM

## 2017-04-10 DIAGNOSIS — C50511 Malignant neoplasm of lower-outer quadrant of right female breast: Secondary | ICD-10-CM

## 2017-04-10 NOTE — Telephone Encounter (Signed)
Spoke with pt, she is wanting a CT scan to r/o PE for peace of mind. She reports SOB that occurs when walking to the mailbox but not every time. She has no edema and wears compression hose. One month ago she finished the radiation tx for her breast cancer. She also reports SOB when lying down that will just go away. She remembers in the past when she had a PE she had the same type of symptoms. She is wanting to go out of town and wants to make sure everything is okay before she goes. Will forward to dr hochrein to review and advise.

## 2017-04-10 NOTE — Telephone Encounter (Signed)
New Message     Patient is requesting to have a CT of her lungs- she thinks she is having SOB but she is not for sure.  She just finished having radiation for breast cancer and doesn't know if that is causing it or not    Pt c/o Shortness Of Breath: STAT if SOB developed within the last 24 hours or pt is noticeably SOB on the phone  1. Are you currently SOB (can you hear that pt is SOB on the phone)? no  2. How long have you been experiencing SOB? It comes and goes   3. Are you SOB when sitting or when up moving around? Both   4. Are you currently experiencing any other symptoms? Patient states she was diagnosed with blood clot in her leg and she wants to make sure it did not get in her lung

## 2017-04-10 NOTE — Progress Notes (Signed)
[name] is here today for 1 month follow up from radiation to breast.  Patient denies having any pain to the breast.  Patient reports having some hyperpigmentation.  Patient denies having any itching currently.  Patient denies having any swelling.  Reports appetite is good.  Reports some mild fatigue but reports it is improving.  Vitals:   04/10/17 1144  BP: 132/88  Pulse: 82  Resp: 18  Temp: 98.2 F (36.8 C)  TempSrc: Oral  SpO2: 100%  Weight: 184 lb 1.6 oz (83.5 kg)   Wt Readings from Last 3 Encounters:  04/10/17 184 lb 1.6 oz (83.5 kg)  03/21/17 184 lb 9.6 oz (83.7 kg)  03/19/17 186 lb (84.4 kg)

## 2017-04-10 NOTE — Progress Notes (Signed)
Radiation Oncology         ([phone]) [phone] ________________________________  Name: [name] MRN: [id number]  Date: 04/10/2017  DOB: 08-06-1955  Follow-Up Visit Note  CC: Long, Nicki Reaper, PA-C  Magrinat, Virgie Dad, MD    ICD-10-CM   1. Malignant neoplasm of lower-outer quadrant of right breast of female, estrogen receptor positive ([place]) C50.511    Z17.0     Diagnosis:   Invasive ductal carcinoma of the LOQ of the right breast (G1, pT1c, pN0, pMX), ER/PR(+), Her2 (-), Oncotype score: 18  Interval Since Last Radiation:  1 months  02/05/2017-03/05/2017 1. Right breast, 2.67 Gy in 15 fractions for a total dose of 40.05 Gy 2. Right breast boost, 2 Gy in 5 fractions for a total dose of 10 Gy  Narrative:  The patient returns today for routine follow-up from radiation to breast. Patient denies having any pain to the breast. Patient reports having some hyperpigmentation. Patient denies having any itching currently. Patient denies having any swelling. Reports appetite is good. Reports some mild fatigue but reports it is improving. About two weeks ago she had a blood clot to the back of her right knee. She has a history of blood clots with the last one being diagnosed in 2010. She was placed on Xarelto for this issue. She endorses occasional breast pain and itchy that has improved since her surgery.                             ALLERGIES:  is allergic to amoxicillin-pot clavulanate and niacin-lovastatin er.  Meds: Current Outpatient Medications  Medication Sig Dispense Refill  . anastrozole (ARIMIDEX) 1 MG tablet Take 1 tablet (1 mg total) by mouth daily. 90 tablet 4  . Ascorbic Acid (VITAMIN C) 1000 MG tablet Take 1,000 mg by mouth daily.    Marland Kitchen aspirin 81 MG tablet Take 81 mg by mouth every evening.     Marland Kitchen atorvastatin (LIPITOR) 20 MG tablet Take 20 mg by mouth daily at 6 PM.     . CALCIUM-VITAMIN D PO Take 1 tablet by mouth daily.    . candesartan (ATACAND) 4 MG tablet Take 4 mg by mouth every  evening.     . Cholecalciferol (VITAMIN D PO) Take 1 tablet by mouth daily.    Marland Kitchen LORazepam (ATIVAN) 0.5 MG tablet Take 0.5 mg by mouth daily as needed for anxiety.   0  . Melatonin 10 MG TABS Take 5 mg by mouth at bedtime.     . methimazole (TAPAZOLE) 10 MG tablet Take 5 mg by mouth daily.     . Omega-3 Fatty Acids (FISH OIL) 1200 MG CAPS Take 1 capsule by mouth daily.    Vladimir Faster Glycol-Propyl Glycol (SYSTANE OP) Apply 1 drop to eye daily as needed (dry eyes).    . rivaroxaban (XARELTO) 20 MG TABS tablet Take 1 tablet (20 mg total) by mouth daily with supper. 30 tablet 11  . SALINE NASAL MIST NA Place 1-2 sprays into the nose daily as needed (dryness).     No current facility-administered medications for this encounter.     Physical Findings: The patient is in no acute distress. Patient is alert and oriented.  weight is 184 lb 1.6 oz (83.5 kg). Her oral temperature is 98.2 F (36.8 C). Her blood pressure is 132/88 and her pulse is 82. Her respiration is 18 and oxygen saturation is 100%.   Lungs are clear to auscultation  bilaterally. Heart has regular rate and rhythm. No palpable cervical, supraclavicular, or axillary adenopathy. Abdomen soft, non-tender, normal bowel sounds. Left breast: no palpable mass, no nipple discharge or bleeding  Right breast: Mild edema and hyperpigmentation changes. Skin has healed well. No palpable mass, no nipple discharge or bleeding   Lab Findings: Lab Results  Component Value Date   WBC 4.1 03/19/2017   HGB 12.2 03/19/2017   HCT 36.7 03/19/2017   MCV 97.3 03/19/2017   PLT 146 03/19/2017    Radiographic Findings: No results found.  Impression:  The patient is recovering from the effects of radiation. Clinically there is no evidence of reoccurrence on exam.   Plan:  PRN Follow-up Radiation Oncology. Continue to follow-up with Dr. Jana Hakim on a regular basis.   ____________________________________  Blair Promise, PhD, MD  This document  serves as a record of services personally performed by Blair Promise, PhD, MD. It was created on his behalf by Margit Banda, a trained medical scribe. The creation of this record is based on the scribe's personal observations and the provider's statements to them.

## 2017-04-10 NOTE — Telephone Encounter (Signed)
She is on blood thinner (please confirm that).  I cannot order a test that includes radiation and contrast dye exposure for "peace of mind".  There has to be a reasonable pretest probability and it would need to change the course of therapy.

## 2017-04-14 NOTE — Telephone Encounter (Signed)
Left detailed message for patient of dr hochrein's recommendations.

## 2017-04-18 ENCOUNTER — Other Ambulatory Visit: Payer: Self-pay

## 2017-04-18 ENCOUNTER — Emergency Department (HOSPITAL_COMMUNITY): Payer: Federal, State, Local not specified - PPO

## 2017-04-18 ENCOUNTER — Encounter (HOSPITAL_COMMUNITY): Payer: Self-pay | Admitting: Emergency Medicine

## 2017-04-18 ENCOUNTER — Emergency Department (HOSPITAL_COMMUNITY)
Admission: EM | Admit: 2017-04-18 | Discharge: 2017-04-18 | Disposition: A | Discharge status: Home / Self Care | Payer: Federal, State, Local not specified - PPO | Attending: Emergency Medicine | Admitting: Emergency Medicine

## 2017-04-18 DIAGNOSIS — E785 Hyperlipidemia, unspecified: Secondary | ICD-10-CM | POA: Insufficient documentation

## 2017-04-18 DIAGNOSIS — Z853 Personal history of malignant neoplasm of breast: Secondary | ICD-10-CM | POA: Insufficient documentation

## 2017-04-18 DIAGNOSIS — Z87891 Personal history of nicotine dependence: Secondary | ICD-10-CM | POA: Diagnosis not present

## 2017-04-18 DIAGNOSIS — R0602 Shortness of breath: Secondary | ICD-10-CM

## 2017-04-18 DIAGNOSIS — Z86718 Personal history of other venous thrombosis and embolism: Secondary | ICD-10-CM | POA: Diagnosis not present

## 2017-04-18 DIAGNOSIS — I10 Essential (primary) hypertension: Secondary | ICD-10-CM | POA: Diagnosis not present

## 2017-04-18 DIAGNOSIS — Z7901 Long term (current) use of anticoagulants: Secondary | ICD-10-CM | POA: Diagnosis not present

## 2017-04-18 DIAGNOSIS — Z79899 Other long term (current) drug therapy: Secondary | ICD-10-CM | POA: Diagnosis not present

## 2017-04-18 LAB — COMPREHENSIVE METABOLIC PANEL
ALBUMIN: 3.8 g/dL (ref 3.5–5.0)
ALK PHOS: 114 U/L (ref 38–126)
ALT: 17 U/L (ref 14–54)
AST: 18 U/L (ref 15–41)
Anion gap: 9 (ref 5–15)
BILIRUBIN TOTAL: 0.8 mg/dL (ref 0.3–1.2)
BUN: 15 mg/dL (ref 6–20)
CO2: 24 mmol/L (ref 22–32)
CREATININE: 0.86 mg/dL (ref 0.44–1.00)
Calcium: 9.6 mg/dL (ref 8.9–10.3)
Chloride: 108 mmol/L (ref 101–111)
GFR calc Af Amer: >60 mL/min (ref >60)
GFR calc non Af Amer: >60 mL/min (ref >60)
GLUCOSE: 108 mg/dL — AB (ref 65–99)
Potassium: 4 mmol/L (ref 3.5–5.1)
Sodium: 141 mmol/L (ref 135–145)
TOTAL PROTEIN: 7.9 g/dL (ref 6.5–8.1)

## 2017-04-18 LAB — CBC
HEMATOCRIT: 35.9 % — AB (ref 36.0–46.0)
Hemoglobin: 11.9 g/dL — ABNORMAL LOW (ref 12.0–15.0)
MCH: 32.2 pg (ref 26.0–34.0)
MCHC: 33.1 g/dL (ref 30.0–36.0)
MCV: 97 fL (ref 78.0–100.0)
Platelets: 154 10*3/uL (ref 150–400)
RBC: 3.7 MIL/uL — ABNORMAL LOW (ref 3.87–5.11)
RDW: 12.8 % (ref 11.5–15.5)
WBC: 5.7 10*3/uL (ref 4.0–10.5)

## 2017-04-18 LAB — TROPONIN I

## 2017-04-18 NOTE — ED Triage Notes (Signed)
Per Pt: Has Hx of DVT 1 month ago and is taking Xarelto. Pt also Dx with breast cancer in November. Pt states she have been feeling SOB intermittently for 2 weeks. A&OX4 and ambulatory.

## 2017-04-18 NOTE — Discharge Instructions (Signed)
Your workup in the emergency department today was reassuring.  We recommend that you continue your daily medications as prescribed.  Follow-up with your primary care doctor regarding your visit to the emergency department today.  It is possible that your symptoms are due to increased stress or poorly managed anxiety.  Your symptoms may improve with more consistent use of Ativan as normally prescribed to you.  Try to avoid caffeine and continue with regular healthy sleep habits.  Remain compliant with your Xarelto for management of your DVT.  You may return to the emergency department for new or concerning symptoms.

## 2017-04-18 NOTE — ED Notes (Signed)
ED Provider at bedside. 

## 2017-04-18 NOTE — ED Notes (Signed)
Per main lab, will add on troponin

## 2017-04-18 NOTE — ED Provider Notes (Signed)
[facility] EMERGENCY DEPARTMENT Provider Note   CSN: [id number] Arrival date & time: 04/18/17  1935     History   Chief Complaint Chief Complaint  Patient presents with  . Shortness of Breath    HPI [name] is a 62 y.o. female.  62 y/o female with hx of HTN, HLD, DVT/PE (currently on Xarelto), and R breast cancer s/p lumpectomy presents to the emergency department for shortness of breath.  Patient states that symptoms often present when she is at rest.  Symptoms are fleeting, lasting a few seconds before spontaneously resolving.  She notes associated palpitations characterized as her heart beating stronger and faster than normal.  She denies missing any doses of her Xarelto.  She has had similar complaints intermittently over the past 3.5 weeks.  She denies any associated fever, syncope, new or worsening leg swelling, nausea, vomiting, diaphoresis.  She has a history of hyperthyroid as well, but has been fully compliant with her methimazole.  No personal hx of ACS.  She does have a hx of anxiety and reports noncompliance with her Ativan.   The history is provided by the patient. No language interpreter was used.  Shortness of Breath     Past Medical History:  Diagnosis Date  . Anxiety   . Arthritis    neck  . Breast cancer ([place])   . Cancer [facility])    right breast cancer  . DVT (deep venous thrombosis) ([place])    04/2010  . GERD (gastroesophageal reflux disease)   . Headache(784.0) 09/22/2012  . Heart murmur    dx in the 90s - said that it comes and goes - cardiologist said it was a mild murmur  . History of hiatal hernia   . History of radiation therapy 02/05/17-03/05/17   right breast 40.05 Gy in 15 fractions, right breast boost 10 Gy in 5 fractions  . Hyperlipemia   . Hyperlipidemia   . Hypertension   . Hyperthyroidism   . Iron deficiency anemia   . Palpitations   . Pulmonary embolism ([place])   . Uterine fibroid   . Vitamin D deficiency    Graves disease    Patient Active Problem List   Diagnosis Date Noted  . Pulmonary emboli ([place]) 11/20/2016  . Malignant neoplasm of lower-outer quadrant of right breast of female, estrogen receptor positive ([place]) 11/19/2016  . Graves disease 10/19/2014  . Palpitation 09/24/2013  . Headache(784.0) 09/22/2012  . Dysarthria 09/22/2012  . LEG PAIN, RIGHT 04/22/2008  . VITAMIN B12 DEFICIENCY 04/19/2008  . ANEMIA, IRON DEFICIENCY 04/19/2008  . HYPERLIPIDEMIA 03/30/2008  . HYPERTENSION 03/30/2008  . ALLERGIC RHINITIS 03/30/2008  . GERD 03/30/2008    Past Surgical History:  Procedure Laterality Date  . BREAST LUMPECTOMY WITH RADIOACTIVE SEED AND SENTINEL LYMPH NODE BIOPSY Right 12/16/2016   Procedure: RIGHT BREAST LUMPECTOMY WITH RADIOACTIVE SEED AND SENTINEL LYMPH NODE BIOPSY;  Surgeon: Rolm Bookbinder, MD;  Location: [facility];  Service: General;  Laterality: Right;  . CHOLECYSTECTOMY    . COLONOSCOPY    . DILATION AND CURETTAGE OF UTERUS     01/2008  . ESOPHAGOGASTRODUODENOSCOPY    . ivc filter      OB History    No data available       Home Medications    Prior to Admission medications   Medication Sig Start Date End Date Taking? Authorizing Provider  anastrozole (ARIMIDEX) 1 MG tablet Take 1 tablet (1 mg total) by mouth daily. 03/19/17  Yes Magrinat,  Virgie Dad, MD  Ascorbic Acid (VITAMIN C) 1000 MG tablet Take 1,000 mg by mouth daily.   Yes [provider]  aspirin 81 MG tablet Take 81 mg by mouth every evening.    Yes [provider]  atorvastatin (LIPITOR) 20 MG tablet Take 20 mg by mouth daily at 6 PM.    Yes [provider]  candesartan (ATACAND) 4 MG tablet Take 4 mg by mouth every evening.    Yes [provider]  Cholecalciferol (VITAMIN D-3 PO) Take 1 capsule by mouth daily.   Yes [provider]  LORazepam (ATIVAN) 0.5 MG tablet Take 0.5 mg by mouth 2 (two) times daily as needed for anxiety.  10/06/14  Yes [provider]  Melatonin 10 MG TABS Take 10 mg by mouth at bedtime as needed (for sleep).    Yes [provider]  methimazole (TAPAZOLE) 5 MG tablet Take 10 mg by mouth daily.   Yes [provider]  Omega-3 Fatty Acids (FISH OIL) 1200 MG CAPS Take 1 capsule by mouth daily.   Yes [provider]  Polyethyl Glycol-Propyl Glycol (SYSTANE OP) Place 1 drop into both eyes daily as needed (dry eyes).    Yes [provider]  rivaroxaban (XARELTO) 20 MG TABS tablet Take 1 tablet (20 mg total) by mouth daily with supper. 03/21/17  Yes Minus Breeding, MD  SALINE NASAL MIST NA Place 1-2 sprays into the nose as needed (for dryness).    Yes [provider]    Family History Family History  Problem Relation Age of Onset  . Drug abuse Brother   . Hypertension Mother   . Colon cancer Maternal Grandfather     Social History Social History   Tobacco Use  . Smoking status: Former Smoker    Packs/day: 0.40    Years: 10.00    Pack years: 4.00    Types: Cigarettes    Last attempt to quit: 06/01/2002    Years since quitting: 14.8  . Smokeless tobacco: Never Used  Substance Use Topics  . Alcohol use: Yes    Comment: occasional 1 drink per month  . Drug use: No     Allergies   Amoxicillin-pot clavulanate and Niacin-lovastatin er   Review of Systems Review of Systems  Respiratory: Positive for shortness of breath.   Ten systems reviewed and are negative for acute change, except as noted in the HPI.    Physical Exam Updated Vital Signs BP (!) 140/96   Pulse 70   Temp 97.9 F (36.6 C) (Oral)   Resp (!) 25   Ht 5\' 4"  (1.626 m)   Wt 83.5 kg (184 lb)   LMP 06/18/2010   SpO2 100%   BMI 31.58 kg/m   Physical Exam  Constitutional: She is oriented to person, place, and time. She appears well-developed and well-nourished. No distress.  Nontoxic appearing and in NAD  HENT:  Head: Normocephalic and atraumatic.  Eyes: Conjunctivae and EOM are  normal. No scleral icterus.  Neck: Normal range of motion.  Cardiovascular: Normal rate, regular rhythm and intact distal pulses.  Pulmonary/Chest: Effort normal. No stridor. No respiratory distress. She has no wheezes. She has no rales.  Lungs CTAB  Abdominal: Soft. She exhibits no distension. There is no tenderness.  Musculoskeletal: Normal range of motion.  No BLE pitting edema.  Neurological: She is alert and oriented to person, place, and time.  Skin: Skin is warm and dry. No rash noted. She is not  diaphoretic. No erythema. No pallor.  Psychiatric: Her behavior is normal.  Mildly anxious  Nursing note and vitals reviewed.    ED Treatments / Results  Labs (all labs ordered are listed, but only abnormal results are displayed) Labs Reviewed  COMPREHENSIVE METABOLIC PANEL - Abnormal; Notable for the following components:      Result Value   Glucose, Bld 108 (*)    All other components within normal limits  CBC - Abnormal; Notable for the following components:   RBC 3.70 (*)    Hemoglobin 11.9 (*)    HCT 35.9 (*)    All other components within normal limits  TROPONIN I    EKG  EKG Interpretation  Date/Time:  Friday April 18 2017 19:54:45 EST Ventricular Rate:  75 PR Interval:  144 QRS Duration: 78 QT Interval:  362 QTC Calculation: 404 R Axis:   23 Text Interpretation:  Normal sinus rhythm Nonspecific T wave abnormality Abnormal ECG No significant change since last tracing Confirmed by Wandra Arthurs [phone]) on 04/18/2017 9:37:14 PM       Radiology Dg Chest 2 View  Result Date: 04/18/2017 CLINICAL DATA:  Intermittent shortness of breath EXAM: CHEST - 2 VIEW COMPARISON:  CT 09/29/2009 FINDINGS: No focal pulmonary infiltrate or effusion. Normal cardiomediastinal silhouette. No pneumothorax. Surgical clips over the right breast. IMPRESSION: No active cardiopulmonary disease. Electronically Signed   By: Donavan Foil M.D.   On: 04/18/2017 20:25    Procedures Procedures  (including critical care time)  Medications Ordered in ED Medications - No data to display   Initial Impression / Assessment and Plan / ED Course  I have reviewed the triage vital signs and the nursing notes.  Pertinent labs & imaging results that were available during my care of the patient were reviewed by me and considered in my medical decision making (see chart for details).     Patient presents to the emergency department for evaluation of SOB and palpitations.  Symptoms last a few seconds before resolving and have been intermittent x 3.5 weeks. Usually come on at rest; not aggravated by exertion.  Low suspicion for cardiac etiology given reassuring workup today.  EKG is nonischemic and troponin negative.  Patient has a heart score of 3 consistent with low risk of acute coronary event.  Chest x-ray without evidence of mediastinal widening to suggest dissection.  No pneumothorax, pneumonia, pleural effusion.  Pulmonary embolus further considered; however, patient without tachycardia, tachypnea, dyspnea, hypoxia today.  She is already on Xarelto for chronic DVT, which appropriately treats a pulmonary embolus, and she has an IVC filter in place.  Patient with known history of anxiety and reports noncompliance with her Ativan.  She does report increased stress lately and I believe her symptoms are likely due to an anxiety reaction.  I do not believe further emergent workup is indicated at this time.  Patient stable for continued follow-up with her primary care doctor.  Return precautions discussed and provided. Patient discharged in stable condition with no unaddressed concerns.   Final Clinical Impressions(s) / ED Diagnoses   Final diagnoses:  SOB (shortness of breath)    ED Discharge Orders    None       Antonietta Breach, PA-C 04/18/17 2318    Valarie Merino, MD 04/19/17 [phone]

## 2017-05-14 ENCOUNTER — Telehealth: Payer: Self-pay | Admitting: Oncology

## 2017-05-14 DIAGNOSIS — J40 Bronchitis, not specified as acute or chronic: Secondary | ICD-10-CM | POA: Diagnosis not present

## 2017-05-14 NOTE — Telephone Encounter (Signed)
05/14/17 @ 9:20 am, spoke with patient to inform Disability forms have been successfully faxed to [place] at [phone]. Mailed a copy to patient as well.

## 2017-06-03 DIAGNOSIS — R062 Wheezing: Secondary | ICD-10-CM | POA: Diagnosis not present

## 2017-06-03 DIAGNOSIS — J019 Acute sinusitis, unspecified: Secondary | ICD-10-CM | POA: Diagnosis not present

## 2017-06-18 ENCOUNTER — Inpatient Hospital Stay: Payer: Federal, State, Local not specified - PPO | Attending: Oncology

## 2017-06-18 ENCOUNTER — Inpatient Hospital Stay: Payer: Federal, State, Local not specified - PPO | Admitting: Oncology

## 2017-06-18 VITALS — BP 122/77 | HR 77 | Temp 98.6°F | Resp 18 | Ht 64.0 in | Wt 186.5 lb

## 2017-06-18 DIAGNOSIS — Z7901 Long term (current) use of anticoagulants: Secondary | ICD-10-CM | POA: Diagnosis not present

## 2017-06-18 DIAGNOSIS — Z86718 Personal history of other venous thrombosis and embolism: Secondary | ICD-10-CM | POA: Diagnosis not present

## 2017-06-18 DIAGNOSIS — Z86711 Personal history of pulmonary embolism: Secondary | ICD-10-CM | POA: Insufficient documentation

## 2017-06-18 DIAGNOSIS — D508 Other iron deficiency anemias: Secondary | ICD-10-CM

## 2017-06-18 DIAGNOSIS — Z79811 Long term (current) use of aromatase inhibitors: Secondary | ICD-10-CM | POA: Diagnosis not present

## 2017-06-18 DIAGNOSIS — Z923 Personal history of irradiation: Secondary | ICD-10-CM

## 2017-06-18 DIAGNOSIS — Z170 Estrogen receptor positive status [ER+]: Secondary | ICD-10-CM | POA: Diagnosis not present

## 2017-06-18 DIAGNOSIS — I2699 Other pulmonary embolism without acute cor pulmonale: Secondary | ICD-10-CM

## 2017-06-18 DIAGNOSIS — C50511 Malignant neoplasm of lower-outer quadrant of right female breast: Secondary | ICD-10-CM

## 2017-06-18 LAB — CBC WITH DIFFERENTIAL/PLATELET
BASOS ABS: 0 10*3/uL (ref 0.0–0.1)
BASOS PCT: 1 %
Eosinophils Absolute: 0.2 10*3/uL (ref 0.0–0.5)
Eosinophils Relative: 4 %
HEMATOCRIT: 38.4 % (ref 34.8–46.6)
HEMOGLOBIN: 12.7 g/dL (ref 11.6–15.9)
Lymphocytes Relative: 27 %
Lymphs Abs: 1.3 10*3/uL (ref 0.9–3.3)
MCH: 32.5 pg (ref 25.1–34.0)
MCHC: 33.1 g/dL (ref 31.5–36.0)
MCV: 98.3 fL (ref 79.5–101.0)
MONOS PCT: 7 %
Monocytes Absolute: 0.3 10*3/uL (ref 0.1–0.9)
NEUTROS ABS: 2.9 10*3/uL (ref 1.5–6.5)
NEUTROS PCT: 61 %
Platelets: 154 10*3/uL (ref 145–400)
RBC: 3.91 MIL/uL (ref 3.70–5.45)
RDW: 13.7 % (ref 11.2–14.5)
WBC: 4.7 10*3/uL (ref 3.9–10.3)

## 2017-06-18 LAB — COMPREHENSIVE METABOLIC PANEL
ALBUMIN: 3.7 g/dL (ref 3.5–5.0)
ALK PHOS: 119 U/L (ref 40–150)
ALT: 20 U/L (ref 0–55)
AST: 15 U/L (ref 5–34)
Anion gap: 3 (ref 3–11)
BILIRUBIN TOTAL: 0.5 mg/dL (ref 0.2–1.2)
BUN: 13 mg/dL (ref 7–26)
CALCIUM: 9.5 mg/dL (ref 8.4–10.4)
CO2: 27 mmol/L (ref 22–29)
Chloride: 109 mmol/L (ref 98–109)
Creatinine, Ser: 0.92 mg/dL (ref 0.60–1.10)
GFR calc Af Amer: >60 mL/min (ref >60)
GFR calc non Af Amer: >60 mL/min (ref >60)
GLUCOSE: 103 mg/dL (ref 70–140)
Potassium: 4.3 mmol/L (ref 3.5–5.1)
Sodium: 139 mmol/L (ref 136–145)
TOTAL PROTEIN: 7.8 g/dL (ref 6.4–8.3)

## 2017-06-18 NOTE — Progress Notes (Signed)
[facility]  Telephone:([phone]) [phone] Fax:([phone]     ID: [name] DOB: Jan 10, 1956  MR#: [id number]  JYN#:[id number]  Patient Care Team: Shanon Rosser, PA-C as PCP - General (Physician Assistant) Rolm Bookbinder, MD as Consulting Physician (General Surgery) Magrinat, Virgie Dad, MD as Consulting Physician (Oncology) Gery Pray, MD as Consulting Physician (Radiation Oncology) OTHER MD:  CHIEF COMPLAINT: Estrogen receptor positive breast cancer  CURRENT TREATMENT: anastrozole  HISTORY OF CURRENT ILLNESS: From the original intake note:  [name] had routine bilateral screening mammography with tomography at the [facility] 10/08/2016. An area of possible distortion in the right breast was noted. She was recalled for right diagnostic mammography with tomography on 11/08/2016. This found the breast density to be category C. The small area of architectural distortion in the lateral right breast persisted and on 11/11/2016 biopsy of this area showed ([id number]) invasive ductal carcinoma, E-cadherin positive, estrogen receptor 95% positive with strong staining intensity, progesterone receptor 5% positive, with strong staining intensity, with an MIB-1 of 10%, and no HER-2 amplification, with a signals ratio of 1.51 and number per cell 3.40. Right axillary ultrasound 11/13/2016 was sonographically benign.  Of note, she has a history of DVT diagnosed March 2010, felt to be possibly related to estrogen replacement therapy. She was coumadinized but developed a pulmonary embolus while on Coumadin. She was then had an IVC filter placed and was anticoagulated with Lovenox for 2 years. An extensive hypercoagulable workup was negative except for an elevated factor VIII level. Her IVC filter is still in place and the patient tells me she is participating in a lawsuit regarding that.   The patient's subsequent history is as detailed below.  INTERVAL HISTORY: [name] returns  today for follow up and treatment of her estrogen receptor positive breast cancer. She continues on anastrozole, which she tolerates relatively well. She does endorse mild hot flashes that are manageable. She denies vaginal dryness.    REVIEW OF SYSTEMS: [name] is doing well overall. However, she did get a sinus infection recently and was given a z-pak, other antibiotics and an inhaler, her symptoms have since resolved. She enjoys doing house work and adds doing about walking 2 miles a day according to the tracker on her phone. She denies unusual headaches, visual changes, nausea, vomiting, or dizziness.  This been no change in bowel or bladder habits. She denies unexplained fatigue or unexplained weight loss, bleeding, rash, or fever. A detailed review of systems was otherwise noncontributory.    PAST MEDICAL HISTORY: Past Medical History:  Diagnosis Date  . Anxiety   . Arthritis    neck  . Breast cancer ([place])   . Cancer [facility])    right breast cancer  . DVT (deep venous thrombosis) ([place])    04/2010  . GERD (gastroesophageal reflux disease)   . Headache(784.0) 09/22/2012  . Heart murmur    dx in the 90s - said that it comes and goes - cardiologist said it was a mild murmur  . History of hiatal hernia   . History of radiation therapy 02/05/17-03/05/17   right breast 40.05 Gy in 15 fractions, right breast boost 10 Gy in 5 fractions  . Hyperlipemia   . Hyperlipidemia   . Hypertension   . Hyperthyroidism   . Iron deficiency anemia   . Palpitations   . Pulmonary embolism ([place])   . Uterine fibroid   . Vitamin D deficiency    Graves disease    PAST SURGICAL HISTORY: Past  Surgical History:  Procedure Laterality Date  . BREAST LUMPECTOMY WITH RADIOACTIVE SEED AND SENTINEL LYMPH NODE BIOPSY Right 12/16/2016   Procedure: RIGHT BREAST LUMPECTOMY WITH RADIOACTIVE SEED AND SENTINEL LYMPH NODE BIOPSY;  Surgeon: Rolm Bookbinder, MD;  Location: [facility];  Service: General;  Laterality: Right;  .  CHOLECYSTECTOMY    . COLONOSCOPY    . DILATION AND CURETTAGE OF UTERUS     01/2008  . ESOPHAGOGASTRODUODENOSCOPY    . ivc filter      FAMILY HISTORY Family History  Problem Relation Age of Onset  . Drug abuse Brother   . Hypertension Mother   . Colon cancer Maternal Grandfather   She notes that her father died at age 42 from an accidental head injury.The patient's mother is 66 years old as of October 2018. Pt has one brother and no sisters. Pt reports that a second cousin has a hx of breast cancer and was dx in her 29's. Pt denies family hx of ovarian cancer.   GYNECOLOGIC HISTORY:  Patient's last menstrual period was 06/18/2010. Menarche: 62 years old Age at first live birth: No children GP: GXP0 LMP: March 2016 Contraceptive: OCP on and off for many years d/c after DVT and PE diagnosis.  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 She denies having pets at home.     ADVANCED DIRECTIVES: Not in place. At the 11/20/2016 visit the patient was given the appropriate documents to complete and notarize at her discretion   HEALTH MAINTENANCE: Social History   Tobacco Use  . Smoking status: Former Smoker    Packs/day: 0.40    Years: 10.00    Pack years: 4.00    Types: Cigarettes    Last attempt to quit: 06/01/2002    Years since quitting: 15.0  . Smokeless tobacco: Never Used  Substance Use Topics  . Alcohol use: Yes    Comment: occasional 1 drink per month  . Drug use: No     Colonoscopy: 2013  PAP: 08/30/2016   Bone density: 10/04/2015 with T-score of -0.3 at femur neck right   Allergies  Allergen Reactions  . Amoxicillin-Pot Clavulanate Other (See Comments)    Dizziness   . Niacin-Lovastatin Er Other (See Comments)    Caused flushing     Current Outpatient Medications  Medication Sig Dispense Refill  . anastrozole (ARIMIDEX) 1 MG tablet Take 1 tablet (1 mg total) by mouth daily. 90 tablet 4  . Ascorbic Acid  (VITAMIN C) 1000 MG tablet Take 1,000 mg by mouth daily.    Marland Kitchen aspirin 81 MG tablet Take 81 mg by mouth every evening.     Marland Kitchen atorvastatin (LIPITOR) 20 MG tablet Take 20 mg by mouth daily at 6 PM.     . candesartan (ATACAND) 4 MG tablet Take 4 mg by mouth every evening.     . Cholecalciferol (VITAMIN D-3 PO) Take 1 capsule by mouth daily.    Marland Kitchen LORazepam (ATIVAN) 0.5 MG tablet Take 0.5 mg by mouth 2 (two) times daily as needed for anxiety.   0  . Melatonin 10 MG TABS Take 10 mg by mouth at bedtime as needed (for sleep).     . methimazole (TAPAZOLE) 5 MG tablet Take 10 mg by mouth daily.    . Omega-3 Fatty Acids (FISH OIL) 1200 MG CAPS Take 1 capsule by mouth daily.    Vladimir Faster Glycol-Propyl Glycol (SYSTANE OP) Place 1 drop  into both eyes daily as needed (dry eyes).     . rivaroxaban (XARELTO) 20 MG TABS tablet Take 1 tablet (20 mg total) by mouth daily with supper. 30 tablet 11  . SALINE NASAL MIST NA Place 1-2 sprays into the nose as needed (for dryness).      No current facility-administered medications for this visit.     OBJECTIVE: Middle-aged African-American woman in no acute distress  Vitals:   06/18/17 1117  BP: 122/77  Pulse: 77  Resp: 18  Temp: 98.6 F (37 C)  SpO2: 100%     Body mass index is 32.01 kg/m.   Wt Readings from Last 3 Encounters:  06/18/17 186 lb 8 oz (84.6 kg)  04/18/17 184 lb (83.5 kg)  04/10/17 184 lb 1.6 oz (83.5 kg)      ECOG FS:0 - Asymptomatic  Sclerae unicteric, EOMs intact Oropharynx clear and moist No cervical or supraclavicular adenopathy Lungs no rales or rhonchi Heart regular rate and rhythm Abd soft, nontender, positive bowel sounds MSK no focal spinal tenderness, no upper extremity lymphedema Neuro: nonfocal, well oriented, appropriate affect Breasts: The right breast is status post lumpectomy followed by radiation.  There is still some hyperpigmentation.  There is no evidence of disease recurrence.  The left breast is benign.  Both  axillae are benign.  LAB RESULTS:  CMP     Component Value Date/Time   NA 141 04/18/2017 2014   NA 141 11/20/2016 0822   K 4.0 04/18/2017 2014   K 3.6 11/20/2016 0822   CL 108 04/18/2017 2014   CL 105 06/24/2012 1548   CO2 24 04/18/2017 2014   CO2 24 11/20/2016 0822   GLUCOSE 108 (H) 04/18/2017 2014   GLUCOSE 102 11/20/2016 0822   GLUCOSE 91 06/24/2012 1548   BUN 15 04/18/2017 2014   BUN 19.2 11/20/2016 0822   CREATININE 0.86 04/18/2017 2014   CREATININE 0.9 11/20/2016 0822   CALCIUM 9.6 04/18/2017 2014   CALCIUM 9.5 11/20/2016 0822   PROT 7.9 04/18/2017 2014   PROT 7.9 03/21/2017 1141   PROT 8.2 11/20/2016 0822   ALBUMIN 3.8 04/18/2017 2014   ALBUMIN 4.3 03/21/2017 1141   ALBUMIN 3.8 11/20/2016 0822   AST 18 04/18/2017 2014   AST 15 11/20/2016 0822   ALT 17 04/18/2017 2014   ALT 15 11/20/2016 0822   ALKPHOS 114 04/18/2017 2014   ALKPHOS 122 11/20/2016 0822   BILITOT 0.8 04/18/2017 2014   BILITOT 0.5 03/21/2017 1141   BILITOT 0.52 11/20/2016 0822   GFRNONAA >60 04/18/2017 2014   GFRAA >60 04/18/2017 2014    No results found for: TOTALPROTELP, ALBUMINELP, A1GS, A2GS, BETS, BETA2SER, GAMS, MSPIKE, SPEI  No results found for: KPAFRELGTCHN, LAMBDASER, KAPLAMBRATIO  Lab Results  Component Value Date   WBC 4.7 06/18/2017   NEUTROABS 2.9 06/18/2017   HGB 12.7 06/18/2017   HCT 38.4 06/18/2017   MCV 98.3 06/18/2017   PLT 154 06/18/2017    _0 @  Lab Results  Component Value Date   LABCA2 17 04/26/2008    No components found for: [id number]  No results for input(s): INR in the last 168 hours.  Lab Results  Component Value Date   LABCA2 17 04/26/2008    No results found for: HYQ657  No results found for: QIO962  No results found for: XBM841  No results found for: CA2729  No components found for: HGQUANT  No results found for: CEA1 / No results found for: CEA1   No  results found for: AFPTUMOR  No results found for: CHROMOGRNA  No  results found for: PSA1  Appointment on 06/18/2017  Component Date Value Ref Range Status  . WBC 06/18/2017 4.7  3.9 - 10.3 K/uL Final  . RBC 06/18/2017 3.91  3.70 - 5.45 MIL/uL Final  . Hemoglobin 06/18/2017 12.7  11.6 - 15.9 g/dL Final  . HCT 06/18/2017 38.4  34.8 - 46.6 % Final  . MCV 06/18/2017 98.3  79.5 - 101.0 fL Final  . MCH 06/18/2017 32.5  25.1 - 34.0 pg Final  . MCHC 06/18/2017 33.1  31.5 - 36.0 g/dL Final  . RDW 06/18/2017 13.7  11.2 - 14.5 % Final  . Platelets 06/18/2017 154  145 - 400 K/uL Final  . Neutrophils Relative % 06/18/2017 61  % Final  . Neutro Abs 06/18/2017 2.9  1.5 - 6.5 K/uL Final  . Lymphocytes Relative 06/18/2017 27  % Final  . Lymphs Abs 06/18/2017 1.3  0.9 - 3.3 K/uL Final  . Monocytes Relative 06/18/2017 7  % Final  . Monocytes Absolute 06/18/2017 0.3  0.1 - 0.9 K/uL Final  . Eosinophils Relative 06/18/2017 4  % Final  . Eosinophils Absolute 06/18/2017 0.2  0.0 - 0.5 K/uL Final  . Basophils Relative 06/18/2017 1  % Final  . Basophils Absolute 06/18/2017 0.0  0.0 - 0.1 K/uL Final   Performed at [facility], [address] [place], [place] [address]    (this displays the last labs from the last 3 days)  No results found for: TOTALPROTELP, ALBUMINELP, A1GS, A2GS, BETS, BETA2SER, GAMS, MSPIKE, SPEI (this displays SPEP labs)  No results found for: KPAFRELGTCHN, LAMBDASER, KAPLAMBRATIO (kappa/lambda light chains)  No results found for: HGBA, HGBA2QUANT, HGBFQUANT, HGBSQUAN (Hemoglobinopathy evaluation)   No results found for: LDH  Lab Results  Component Value Date   IRON 61 12/25/2012   TIBC 331 12/25/2012   IRONPCTSAT 18 (L) 12/25/2012   (Iron and TIBC)  Lab Results  Component Value Date   FERRITIN 31 12/25/2012    Urinalysis    Component Value Date/Time   COLORURINE LT YELLOW 04/18/2008 1220   APPEARANCEUR Clear 04/18/2008 1220   LABSPEC > OR = 1.030 04/18/2008 1220   PHURINE 5.5 04/18/2008 1220    GLUCOSEU NEGATIVE 04/18/2008 1220   BILIRUBINUR NEGATIVE 04/18/2008 1220   KETONESUR NEGATIVE 04/18/2008 1220   UROBILINOGEN 0.2 mg/dL 04/18/2008 1220   NITRITE Negative 04/18/2008 1220   LEUKOCYTESUR Negative 04/18/2008 1220     STUDIES: She will be due for repeat mammography October of this year  ELIGIBLE FOR AVAILABLE RESEARCH PROTOCOL: no  ASSESSMENT: 62 y.o. [place] woman status post right breast lower outer quadrant biopsy 11/11/2016 for a clinical TX N), stage I invasive ductal carcinoma, grade 2, E-cadherin positive, estrogen receptor 95% positive, progesterone receptor 5% positive, with an MIB-1 of 10%, and no HER-2 amplification  (1) Status post right lumpectomy and right axillary sentinel lymph node sampling 12/16/2016 for a pT1c pN0, stage IA invasive ductal carcinoma, grade 1, with negative margins.  Total of 3 sentinel lymph nodes removed  (2) The Oncotype DX score was 18, predicting a risk of outside the breast recurrence over the next 10 years of 11% if the patient's only systemic therapy is tamoxifen for 5 years.  It also predicts no benefit from chemotherapy.  (3) Adjuvant radiation 02/05/2017-03/05/2017 Site/dose:    1. Right breast, 2.67 Gy in 15 fractions for a total dose of 40.05 Gy  2. Right breast boost, 2 Gy in 5 fractions for a total dose of 10 Gy   (4) anastrozole started February 2019  (a) not a good tamoxifen candidate given problem #5  (b) bone density at breast center 10/04/2015 with T-score of -0.3 (normal)  (5) history of DVT/PE March 2010, with negative extensive hypercoagulable workup, IVC filter in place  PLAN: Nyimah is now 6 months out from definitive surgery for her breast cancer.  She is recovering well from the initial treatment.  She is tolerating anastrozole with no significant side effects and the plan will be to continue that a minimum of 5 years.  I have encouraged her to exercise more regularly.  I think that will  help her "mental fog" clear.  I have set her up for repeat mammography in October and I will see her shortly after that.  She knows to call for any other issues that may develop before then.   Magrinat, Virgie Dad, MD  06/18/17 11:31 AM Medical Oncology and Hematology [facility] [address] [place], [place] [address] Tel. [phone]    Fax. [phone]  This document serves as a record of services personally performed by Chauncey Cruel, MD. It was created on his behalf by Margit Banda, a trained medical scribe. The creation of this record is based on the scribe's personal observations and the provider's statements to them.   I have reviewed the above documentation for accuracy and completeness, and I agree with the above.

## 2017-06-19 LAB — VITAMIN D 25 HYDROXY (VIT D DEFICIENCY, FRACTURES): Vit D, 25-Hydroxy: 41.6 ng/mL (ref 30.0–100.0)

## 2017-06-22 NOTE — Progress Notes (Signed)
Cardiology Office Note   Date:  06/23/2017   ID:  [name], DOB [birth year], MRN [id number]  PCP:  Shanon Rosser, PA-C  Cardiologist:   Minus Breeding, MD    Chief Complaint  Patient presents with  . Leg Pain     History of Present Illness: [name] is a 62 y.o. female who presents for follow up of palpitations.  I saw her last in Feb.   She has had palpitations in the past and reports a treadmill test years ago in [place].   She has a history of DVT/PE and IVC filter several years ago.  At the last visit I checked a venous Dopler and there was evidence of old clot and I clot and new clot could not be excluded.  I started her on xarelto.   She has called with SOB and wanted a CT to rule out PE but this was not indicated.  Soon after this she was in the ED for SOB and she was treated for anxiety.   I reviewed these records for this visit.    Since her last saw her she has been diagnosed with sinus infection.  She has different somatic complaints including tinge of leg aching which is a discomfort really from sometimes her upper leg or hip down and seems to be positional.  She denies any new shortness of breath, PND or orthopnea.  She has no new chest pressure, neck or arm discomfort.  She does a little activity but not as much walking as I would like.    Past Medical History:  Diagnosis Date  . Anxiety   . Arthritis    neck  . Breast cancer ([place])   . Cancer [facility])    right breast cancer  . DVT (deep venous thrombosis) ([place])    04/2010  . GERD (gastroesophageal reflux disease)   . Headache(784.0) 09/22/2012  . Heart murmur    dx in the 90s - said that it comes and goes - cardiologist said it was a mild murmur  . History of hiatal hernia   . History of radiation therapy 02/05/17-03/05/17   right breast 40.05 Gy in 15 fractions, right breast boost 10 Gy in 5 fractions  . Hyperlipemia   . Hyperlipidemia   . Hypertension   . Hyperthyroidism   . Iron deficiency  anemia   . Palpitations   . Pulmonary embolism ([place])   . Uterine fibroid   . Vitamin D deficiency    Graves disease    Past Surgical History:  Procedure Laterality Date  . BREAST LUMPECTOMY WITH RADIOACTIVE SEED AND SENTINEL LYMPH NODE BIOPSY Right 12/16/2016   Procedure: RIGHT BREAST LUMPECTOMY WITH RADIOACTIVE SEED AND SENTINEL LYMPH NODE BIOPSY;  Surgeon: Rolm Bookbinder, MD;  Location: [place];  Service: General;  Laterality: Right;  . CHOLECYSTECTOMY    . COLONOSCOPY    . DILATION AND CURETTAGE OF UTERUS     01/2008  . ESOPHAGOGASTRODUODENOSCOPY    . ivc filter       Current Outpatient Medications  Medication Sig Dispense Refill  . anastrozole (ARIMIDEX) 1 MG tablet Take 1 tablet (1 mg total) by mouth daily. 90 tablet 4  . Ascorbic Acid (VITAMIN C) 1000 MG tablet Take 1,000 mg by mouth daily.    Marland Kitchen aspirin 81 MG tablet Take 81 mg by mouth every evening.     Marland Kitchen atorvastatin (LIPITOR) 20 MG tablet Take 20 mg by mouth daily at 6 PM.     .  candesartan (ATACAND) 4 MG tablet Take 4 mg by mouth every evening.     . Cholecalciferol (VITAMIN D-3 PO) Take 1 capsule by mouth daily.    Marland Kitchen LORazepam (ATIVAN) 0.5 MG tablet Take 0.5 mg by mouth 2 (two) times daily as needed for anxiety.   0  . Melatonin 10 MG TABS Take 10 mg by mouth at bedtime as needed (for sleep).     . methimazole (TAPAZOLE) 5 MG tablet Take 10 mg by mouth daily.    . Omega-3 Fatty Acids (FISH OIL) 1200 MG CAPS Take 1 capsule by mouth daily.    Vladimir Faster Glycol-Propyl Glycol (SYSTANE OP) Place 1 drop into both eyes daily as needed (dry eyes).     . rivaroxaban (XARELTO) 20 MG TABS tablet Take 1 tablet (20 mg total) by mouth daily with supper. 30 tablet 11  . SALINE NASAL MIST NA Place 1-2 sprays into the nose as needed (for dryness).      No current facility-administered medications for this visit.     Allergies:   Amoxicillin-pot clavulanate and Niacin-lovastatin er    ROS:  Please see the history of present  illness.   Otherwise, review of systems are positive for balance issues, fatigue, feeling "foggy".   All other systems are reviewed and negative.    PHYSICAL EXAM: VS:  BP 128/86   Pulse 88   Ht 5' 6.5" (1.689 m)   Wt 186 lb 12.8 oz (84.7 kg)   LMP 06/18/2010   SpO2 99%   BMI 29.70 kg/m  , BMI Body mass index is 29.7 kg/m.  GENERAL:  Well appearing NECK:  No jugular venous distention, waveform within normal limits, carotid upstroke brisk and symmetric, no bruits, no thyromegaly LUNGS:  Clear to auscultation bilaterally CHEST:  Unremarkable HEART:  PMI not displaced or sustained,S1 and S2 within normal limits, no S3, no S4, no clicks, no rubs, no murmurs ABD:  Flat, positive bowel sounds normal in frequency in pitch, no bruits, no rebound, no guarding, no midline pulsatile mass, no hepatomegaly, no splenomegaly EXT:  2 plus pulses throughout, no edema, no cyanosis no clubbing    EKG:  EKG is notordered today.   Recent Labs: 06/18/2017: ALT 20; BUN 13; Creatinine, Ser 0.92; Hemoglobin 12.7; Platelets 154; Potassium 4.3; Sodium 139    Lipid Panel    Component Value Date/Time   CHOL 152 03/21/2017 1141   TRIG 63 03/21/2017 1141   HDL 49 03/21/2017 1141   CHOLHDL 3.1 03/21/2017 1141   CHOLHDL 4.4 04/06/2010 1037   VLDL 16 04/06/2010 1037   LDLCALC 90 03/21/2017 1141      Wt Readings from Last 3 Encounters:  06/23/17 186 lb 12.8 oz (84.7 kg)  06/18/17 186 lb 8 oz (84.6 kg)  04/18/17 184 lb (83.5 kg)      Other studies Reviewed: Additional studies/ records that were reviewed today include:    ED records. Review of the above records demonstrates:  See elsewhere   ASSESSMENT AND PLAN:  Murmur:  She had a normal echo in 2018.   No further work u it was impossible to tell whether she had a new or old clot on thep.  HTN:   Her blood pressure is upper limits of normal.  We are treating this with therapeutic lifestyle changes.    DVT:    It was impossible to tell whether  she had new or old clot at the Doppler in February.  I treated her with Xarelto  given her complaints.  My plan is to complete a 51-month course given the severity of previous presentations and her symptoms.  I will reassess with another Doppler in 3 months and most likely be able to stop the blood thinner.  She and I have had long discussions about this and she is had some difficulty understanding the difference between chronic clot and the difficulty in assessing this  Dyslipidemia:    HDL and LDL were at acceptable limits.   No change in therapy.   Current medicines are reviewed at length with the patient today.  The patient does not have concerns regarding medicines.  The following changes have been made:   None  Labs/ tests ordered today include:     No orders of the defined types were placed in this encounter.    Disposition:   FU with me in 12 months. Ronnell Guadalajara, MD  06/23/2017 12:44 PM    [place]

## 2017-06-23 ENCOUNTER — Ambulatory Visit: Payer: Federal, State, Local not specified - PPO | Admitting: Cardiology

## 2017-06-23 ENCOUNTER — Encounter: Payer: Self-pay | Admitting: Cardiology

## 2017-06-23 VITALS — BP 128/86 | HR 88 | Ht 66.5 in | Wt 186.8 lb

## 2017-06-23 DIAGNOSIS — M79604 Pain in right leg: Secondary | ICD-10-CM

## 2017-06-23 DIAGNOSIS — E785 Hyperlipidemia, unspecified: Secondary | ICD-10-CM

## 2017-06-23 DIAGNOSIS — R011 Cardiac murmur, unspecified: Secondary | ICD-10-CM

## 2017-06-23 NOTE — Patient Instructions (Signed)
Medication Instructions:  Continue current medications  If you need a refill on your cardiac medications before your next appointment, please call your pharmacy.  Labwork: None Ordered   Testing/Procedures: Your physician has requested that you have a lower extremity venous duplex in 3 Months. This test is an ultrasound of the veins in the legs. It looks at venous blood flow that carries blood from the heart to the legs. Allow one hour for a Lower Venous exam. Allow thirty minutes for an Upper Venous exam. There are no restrictions or special instructions.   Follow-Up: Your physician wants you to follow-up in: 1 Year. You should receive a reminder letter in the mail two months in advance. If you do not receive a letter, please call our office [phone].      Thank you for choosing [facility] at [facility]!!

## 2017-06-30 ENCOUNTER — Other Ambulatory Visit: Payer: Self-pay

## 2017-06-30 ENCOUNTER — Emergency Department (HOSPITAL_COMMUNITY): Payer: Federal, State, Local not specified - PPO

## 2017-06-30 ENCOUNTER — Encounter (HOSPITAL_COMMUNITY): Payer: Self-pay

## 2017-06-30 ENCOUNTER — Emergency Department (HOSPITAL_COMMUNITY)
Admission: EM | Admit: 2017-06-30 | Discharge: 2017-06-30 | Disposition: A | Discharge status: Home / Self Care | Payer: Federal, State, Local not specified - PPO | Attending: Emergency Medicine | Admitting: Emergency Medicine

## 2017-06-30 DIAGNOSIS — Z853 Personal history of malignant neoplasm of breast: Secondary | ICD-10-CM | POA: Insufficient documentation

## 2017-06-30 DIAGNOSIS — R42 Dizziness and giddiness: Secondary | ICD-10-CM | POA: Insufficient documentation

## 2017-06-30 DIAGNOSIS — Z86711 Personal history of pulmonary embolism: Secondary | ICD-10-CM | POA: Diagnosis not present

## 2017-06-30 DIAGNOSIS — R0602 Shortness of breath: Secondary | ICD-10-CM | POA: Insufficient documentation

## 2017-06-30 DIAGNOSIS — Z7901 Long term (current) use of anticoagulants: Secondary | ICD-10-CM | POA: Insufficient documentation

## 2017-06-30 DIAGNOSIS — R1110 Vomiting, unspecified: Secondary | ICD-10-CM | POA: Diagnosis not present

## 2017-06-30 DIAGNOSIS — Z79899 Other long term (current) drug therapy: Secondary | ICD-10-CM | POA: Diagnosis not present

## 2017-06-30 DIAGNOSIS — R05 Cough: Secondary | ICD-10-CM | POA: Insufficient documentation

## 2017-06-30 DIAGNOSIS — R002 Palpitations: Secondary | ICD-10-CM | POA: Insufficient documentation

## 2017-06-30 DIAGNOSIS — I10 Essential (primary) hypertension: Secondary | ICD-10-CM | POA: Insufficient documentation

## 2017-06-30 DIAGNOSIS — Z87891 Personal history of nicotine dependence: Secondary | ICD-10-CM | POA: Insufficient documentation

## 2017-06-30 LAB — COMPREHENSIVE METABOLIC PANEL
ALT: 16 U/L (ref 14–54)
ANION GAP: 9 (ref 5–15)
AST: 19 U/L (ref 15–41)
Albumin: 4 g/dL (ref 3.5–5.0)
Alkaline Phosphatase: 106 U/L (ref 38–126)
BUN: 10 mg/dL (ref 6–20)
CALCIUM: 9.6 mg/dL (ref 8.9–10.3)
CHLORIDE: 107 mmol/L (ref 101–111)
CO2: 25 mmol/L (ref 22–32)
Creatinine, Ser: 0.98 mg/dL (ref 0.44–1.00)
Glucose, Bld: 119 mg/dL — ABNORMAL HIGH (ref 65–99)
Potassium: 4.4 mmol/L (ref 3.5–5.1)
Sodium: 141 mmol/L (ref 135–145)
Total Bilirubin: 0.4 mg/dL (ref 0.3–1.2)
Total Protein: 8.3 g/dL — ABNORMAL HIGH (ref 6.5–8.1)

## 2017-06-30 LAB — CBC WITH DIFFERENTIAL/PLATELET
Basophils Absolute: 0 10*3/uL (ref 0.0–0.1)
Basophils Relative: 0 %
EOS ABS: 0.2 10*3/uL (ref 0.0–0.7)
EOS PCT: 4 %
HCT: 37.9 % (ref 36.0–46.0)
Hemoglobin: 12.5 g/dL (ref 12.0–15.0)
LYMPHS ABS: 1.6 10*3/uL (ref 0.7–4.0)
LYMPHS PCT: 29 %
MCH: 32.6 pg (ref 26.0–34.0)
MCHC: 33 g/dL (ref 30.0–36.0)
MCV: 98.7 fL (ref 78.0–100.0)
MONO ABS: 0.3 10*3/uL (ref 0.1–1.0)
MONOS PCT: 5 %
Neutro Abs: 3.4 10*3/uL (ref 1.7–7.7)
Neutrophils Relative %: 62 %
PLATELETS: 170 10*3/uL (ref 150–400)
RBC: 3.84 MIL/uL — ABNORMAL LOW (ref 3.87–5.11)
RDW: 12.9 % (ref 11.5–15.5)
WBC: 5.6 10*3/uL (ref 4.0–10.5)

## 2017-06-30 LAB — I-STAT TROPONIN, ED
TROPONIN I, POC: 0 ng/mL (ref 0.00–0.08)
TROPONIN I, POC: 0 ng/mL (ref 0.00–0.08)

## 2017-06-30 MED ORDER — ALBUTEROL SULFATE (2.5 MG/3ML) 0.083% IN NEBU
5.0000 mg | INHALATION_SOLUTION | Freq: Once | RESPIRATORY_TRACT | Status: DC
  Filled 2017-06-30: qty 6

## 2017-06-30 NOTE — ED Provider Notes (Signed)
[facility] DEPT Provider Note  CSN: [id number] Arrival date & time: 06/30/17 1813  Chief Complaint(s) Shortness of Breath; Palpitations; Dizziness; Fatigue; and chemo card  HPI [name] is a 62 y.o. female with stage 0-1 breast cancer s/p lumpectomy and radiation currently on Arimidex.  The history is provided by the patient.  Shortness of Breath  This is a new (statest that it is very mild and not similar to prior PE.) problem. The problem occurs intermittently.The current episode started 3 to 5 hours ago. The problem has been resolved. Associated symptoms include cough (from recent sinus infection; improving). Pertinent negatives include no fever, no headaches, no rhinorrhea, no sputum production, no hemoptysis, no PND, no orthopnea, no chest pain, no syncope, no vomiting, no leg pain and no leg swelling. Precipitated by: only with walking. Associated medical issues include PE (on Xarelto).  Palpitations   Associated symptoms include nausea, dizziness, cough (from recent sinus infection; improving) and shortness of breath. Pertinent negatives include no fever, no chest pain, no orthopnea, no PND, no syncope, no vomiting, no headaches, no leg pain, no hemoptysis and no sputum production.  Dizziness  Description: head "fog" Severity:  Mild Onset quality:  Unable to specify Duration:  2 months Timing:  Constant Progression:  Waxing and waning Chronicity:  Recurrent (several months to a yr) Relieved by:  Nothing Worsened by:  Nothing Associated symptoms: nausea, palpitations and shortness of breath   Associated symptoms: no blood in stool, no chest pain, no headaches, no syncope, no vision changes and no vomiting     Past Medical History Past Medical History:  Diagnosis Date  . Anxiety   . Arthritis    neck  . Breast cancer ([place])   . Cancer [facility])    right breast cancer  . DVT (deep venous thrombosis) ([place])    04/2010  . GERD  (gastroesophageal reflux disease)   . Headache(784.0) 09/22/2012  . Heart murmur    dx in the 90s - said that it comes and goes - cardiologist said it was a mild murmur  . History of hiatal hernia   . History of radiation therapy 02/05/17-03/05/17   right breast 40.05 Gy in 15 fractions, right breast boost 10 Gy in 5 fractions  . Hyperlipemia   . Hyperlipidemia   . Hypertension   . Hyperthyroidism   . Iron deficiency anemia   . Palpitations   . Pulmonary embolism ([place])   . Uterine fibroid   . Vitamin D deficiency    Graves disease   Patient Active Problem List   Diagnosis Date Noted  . Pulmonary emboli ([place]) 11/20/2016  . Malignant neoplasm of lower-outer quadrant of right breast of female, estrogen receptor positive ([place]) 11/19/2016  . Graves disease 10/19/2014  . Palpitation 09/24/2013  . Headache(784.0) 09/22/2012  . Dysarthria 09/22/2012  . LEG PAIN, RIGHT 04/22/2008  . VITAMIN B12 DEFICIENCY 04/19/2008  . ANEMIA, IRON DEFICIENCY 04/19/2008  . HYPERLIPIDEMIA 03/30/2008  . HYPERTENSION 03/30/2008  . ALLERGIC RHINITIS 03/30/2008  . GERD 03/30/2008   Home Medication(s) Prior to Admission medications   Medication Sig Start Date End Date Taking? Authorizing Provider  anastrozole (ARIMIDEX) 1 MG tablet Take 1 tablet (1 mg total) by mouth daily. 03/19/17  Yes Magrinat, Virgie Dad, MD  Ascorbic Acid (VITAMIN C) 1000 MG tablet Take 1,000 mg by mouth daily.   Yes [provider]  atorvastatin (LIPITOR) 20 MG tablet Take 20 mg by mouth daily at 6 PM.  Yes [provider]  BIOTIN PO Take 1 tablet by mouth daily.   Yes [provider]  Cholecalciferol (VITAMIN D-3 PO) Take 1 capsule by mouth daily.   Yes [provider]  LORazepam (ATIVAN) 0.5 MG tablet Take 0.5 mg by mouth 2 (two) times daily as needed for anxiety.  10/06/14  Yes [provider]  methimazole (TAPAZOLE) 5 MG tablet Take 10 mg by mouth daily.   Yes [provider]    Omega-3 Fatty Acids (FISH OIL) 1200 MG CAPS Take 1 capsule by mouth daily.   Yes [provider]  rivaroxaban (XARELTO) 20 MG TABS tablet Take 1 tablet (20 mg total) by mouth daily with supper. 03/21/17  Yes Minus Breeding, MD  albuterol (PROVENTIL HFA;VENTOLIN HFA) 108 (90 Base) MCG/ACT inhaler  06/29/17   [provider]  Melatonin 10 MG TABS Take 10 mg by mouth at bedtime as needed (for sleep).     [provider]  Polyethyl Glycol-Propyl Glycol (SYSTANE OP) Place 1 drop into both eyes daily as needed (dry eyes).     [provider]  SALINE NASAL MIST NA Place 1-2 sprays into the nose as needed (for dryness).     [provider]                                                                                                                                    Past Surgical History Past Surgical History:  Procedure Laterality Date  . BREAST LUMPECTOMY WITH RADIOACTIVE SEED AND SENTINEL LYMPH NODE BIOPSY Right 12/16/2016   Procedure: RIGHT BREAST LUMPECTOMY WITH RADIOACTIVE SEED AND SENTINEL LYMPH NODE BIOPSY;  Surgeon: Rolm Bookbinder, MD;  Location: [facility];  Service: General;  Laterality: Right;  . CHOLECYSTECTOMY    . COLONOSCOPY    . DILATION AND CURETTAGE OF UTERUS     01/2008  . ESOPHAGOGASTRODUODENOSCOPY    . ivc filter     Family History Family History  Problem Relation Age of Onset  . Drug abuse Brother   . Hypertension Mother   . Colon cancer Maternal Grandfather     Social History Social History   Tobacco Use  . Smoking status: Former Smoker    Packs/day: 0.40    Years: 10.00    Pack years: 4.00    Types: Cigarettes    Last attempt to quit: 06/01/2002    Years since quitting: 15.0  . Smokeless tobacco: Never Used  Substance Use Topics  . Alcohol use: Yes    Comment: occasional 1 drink per month  . Drug use: No   Allergies Amoxicillin-pot clavulanate and Niacin-lovastatin er  Review of Systems Review of Systems   Constitutional: Negative for fever.  HENT: Negative for rhinorrhea.   Respiratory: Positive for cough (from recent sinus infection; improving) and shortness of breath. Negative for hemoptysis and sputum production.   Cardiovascular: Positive for palpitations. Negative for chest pain, orthopnea,  leg swelling, syncope and PND.  Gastrointestinal: Positive for nausea. Negative for blood in stool and vomiting.  Neurological: Positive for dizziness. Negative for headaches.   All other systems are reviewed and are negative for acute change except as noted in the HPI  Physical Exam Vital Signs  I have reviewed the triage vital signs BP 134/85 (BP Location: Left Arm)   Pulse 73   Temp 98.4 F (36.9 C) (Oral)   Resp 18   Ht 5' 6.5" (1.689 m)   Wt 84.4 kg (186 lb)   LMP 06/18/2010   SpO2 100%   BMI 29.57 kg/m   Physical Exam  Constitutional: She is oriented to person, place, and time. She appears well-developed and well-nourished. No distress.  HENT:  Head: Normocephalic and atraumatic.  Nose: Nose normal.  Eyes: Pupils are equal, round, and reactive to light. Conjunctivae and EOM are normal. Right eye exhibits no discharge. Left eye exhibits no discharge. No scleral icterus.  Neck: Normal range of motion. Neck supple.  Cardiovascular: Normal rate and regular rhythm. Exam reveals no gallop and no friction rub.  No murmur heard. Pulmonary/Chest: Effort normal and breath sounds normal. No stridor. No respiratory distress. She has no rales.  Abdominal: Soft. She exhibits no distension. There is no tenderness.  Musculoskeletal: She exhibits no edema or tenderness.  Neurological: She is alert and oriented to person, place, and time.  Skin: Skin is warm and dry. No rash noted. She is not diaphoretic. No erythema.  Psychiatric: She has a normal mood and affect.  Vitals reviewed.   ED Results and Treatments Labs (all labs ordered are listed, but only abnormal results are displayed) Labs  Reviewed  CBC WITH DIFFERENTIAL/PLATELET - Abnormal; Notable for the following components:      Result Value   RBC 3.84 (*)    All other components within normal limits  COMPREHENSIVE METABOLIC PANEL - Abnormal; Notable for the following components:   Glucose, Bld 119 (*)    Total Protein 8.3 (*)    All other components within normal limits  I-STAT TROPONIN, ED  I-STAT TROPONIN, ED                                                                                                                         EKG  EKG Interpretation  Date/Time:  Monday Jun 30 2017 18:33:39 EDT Ventricular Rate:  77 PR Interval:    QRS Duration: 139 QT Interval:  353 QTC Calculation: 400 R Axis:   30 Text Interpretation:  Sinus rhythm Nonspecific intraventricular conduction delay Borderline T abnormalities, anterior leads No significant change since last tracing Confirmed by Addison Lank [phone]) on 06/30/2017 9:42:46 PM      Radiology Dg Chest 2 View  Result Date: 06/30/2017 CLINICAL DATA:  Shortness of breath.  Dizziness.  Fatigue. EXAM: CHEST - 2 VIEW COMPARISON:  04/18/2017 FINDINGS: Lateral view degraded by patient arm position. Moderate thoracic spondylosis. Surgical clips project over the right breast. Midline trachea.  Normal heart size. Atherosclerosis in the transverse aorta. No pleural effusion or pneumothorax. Clear lungs. IMPRESSION: No acute cardiopulmonary disease. Electronically Signed   By: Abigail Miyamoto M.D.   On: 06/30/2017 20:24   Pertinent labs & imaging results that were available during my care of the patient were reviewed by me and considered in my medical decision making (see chart for details).  Medications Ordered in ED Medications - No data to display                                                                                                                                  Procedures Procedures  (including critical care time)  Medical Decision Making / ED Course I have  reviewed the nursing notes for this encounter and the patient's prior records (if available in EHR or on provided paperwork).    Patient with mild nonspecific symptoms.  Patient reports that her "fogginess" may be due to menopause or her being on the Arimidex.  No current infectious symptoms.  No focal deficits.  Labs grossly reassuring without anemia, leukocytosis, significant electrolyte derangements or renal insufficiency.   Recurrent the patient is shortness of breath.  She believes that it was possibly from anxiety.  She is currently asymptomatic and does not believe that this is due to the recurrence of a pulmonary embolism.  She denied any associated chest pain.  EKG without acute ischemic changes or evidence of pericarditis.  Serial troponins negative x2. Chest x-ray without evidence suggestive of pneumonia, pneumothorax, pneumomediastinum.  No abnormal contour of the mediastinum to suggest dissection. No evidence of acute injuries.  I have low suspicion for pulmonary embolism recurrence.  The patient appears reasonably screened and/or stabilized for discharge and I doubt any other medical condition or other [facility] requiring further screening, evaluation, or treatment in the ED at this time prior to discharge.  The patient is safe for discharge with strict return precautions.   Final Clinical Impression(s) / ED Diagnoses Final diagnoses:  Episodic lightheadedness  SOB (shortness of breath)    Disposition: Discharge  Condition: Good  I have discussed the results, Dx and Tx plan with the patient who expressed understanding and agree(s) with the plan. Discharge instructions discussed at great length. The patient was given strict return precautions who verbalized understanding of the instructions. No further questions at time of discharge.    ED Discharge Orders    None       Follow Up: Shanon Rosser, PA-C [place]  [id number] [phone]  Schedule an  appointment as soon as possible for a visit  in 3-5 days, If symptoms do not improve or  worsen     This chart was dictated using voice recognition software.  Despite best efforts to proofread,  errors can occur which can change the documentation meaning.   Fatima Blank, MD 06/30/17 [phone]

## 2017-06-30 NOTE — ED Triage Notes (Signed)
Patient c/o SOB, dizziness, fatigue, and heart palpitations. Patient states that she began having symptoms 23 days. Patient was at the cancer center today and was feeling worse.

## 2017-06-30 NOTE — ED Notes (Signed)
Provided patient ginger ale for oral hydration.

## 2017-07-09 ENCOUNTER — Ambulatory Visit: Payer: Federal, State, Local not specified - PPO | Admitting: Vascular Surgery

## 2017-07-10 ENCOUNTER — Other Ambulatory Visit: Payer: Self-pay

## 2017-07-10 DIAGNOSIS — Z95828 Presence of other vascular implants and grafts: Secondary | ICD-10-CM

## 2017-07-11 ENCOUNTER — Other Ambulatory Visit: Payer: Self-pay | Admitting: *Deleted

## 2017-07-11 DIAGNOSIS — Z170 Estrogen receptor positive status [ER+]: Principal | ICD-10-CM

## 2017-07-11 DIAGNOSIS — C50511 Malignant neoplasm of lower-outer quadrant of right female breast: Secondary | ICD-10-CM

## 2017-07-11 DIAGNOSIS — N63 Unspecified lump in unspecified breast: Secondary | ICD-10-CM

## 2017-07-23 ENCOUNTER — Telehealth: Payer: Self-pay

## 2017-07-23 NOTE — Telephone Encounter (Signed)
VM from pt stating "I called last week about a new lump that I found and thought someone was going to schedule an appt for me to come in to be seen."   Upon review of pt's chart an order for bilat MM and unilat [place] to evaluate new lump was placed on 5/31. This RN called pt to find out if she has had these test.  Pt states that she has not and was not aware of these orders. I instructed pt to call the [facility] [place] to schedule an appt.  Once she has her appt for MM and [place] she is to call us so we can schedule her an appt to see GM.  Pt verbalizes understanding.

## 2017-07-31 ENCOUNTER — Other Ambulatory Visit: Payer: Self-pay | Admitting: Adult Health

## 2017-07-31 DIAGNOSIS — C50511 Malignant neoplasm of lower-outer quadrant of right female breast: Secondary | ICD-10-CM

## 2017-07-31 DIAGNOSIS — Z170 Estrogen receptor positive status [ER+]: Principal | ICD-10-CM

## 2017-07-31 DIAGNOSIS — N63 Unspecified lump in unspecified breast: Secondary | ICD-10-CM

## 2017-08-01 ENCOUNTER — Ambulatory Visit
Admission: RE | Admit: 2017-08-01 | Discharge: 2017-08-01 | Disposition: A | Discharge status: Home / Self Care | Payer: Federal, State, Local not specified - PPO | Source: Ambulatory Visit | Attending: Oncology | Admitting: Oncology

## 2017-08-01 DIAGNOSIS — R922 Inconclusive mammogram: Secondary | ICD-10-CM | POA: Diagnosis not present

## 2017-08-01 DIAGNOSIS — C50511 Malignant neoplasm of lower-outer quadrant of right female breast: Secondary | ICD-10-CM

## 2017-08-01 DIAGNOSIS — N63 Unspecified lump in unspecified breast: Secondary | ICD-10-CM

## 2017-08-01 DIAGNOSIS — Z170 Estrogen receptor positive status [ER+]: Principal | ICD-10-CM

## 2017-08-01 DIAGNOSIS — N6489 Other specified disorders of breast: Secondary | ICD-10-CM | POA: Diagnosis not present

## 2017-08-05 ENCOUNTER — Other Ambulatory Visit: Payer: Self-pay

## 2017-08-05 DIAGNOSIS — Z95828 Presence of other vascular implants and grafts: Secondary | ICD-10-CM

## 2017-08-09 DIAGNOSIS — Z13228 Encounter for screening for other metabolic disorders: Secondary | ICD-10-CM | POA: Diagnosis not present

## 2017-08-10 DIAGNOSIS — Z13228 Encounter for screening for other metabolic disorders: Secondary | ICD-10-CM | POA: Diagnosis not present

## 2017-08-10 DIAGNOSIS — E0590 Thyrotoxicosis, unspecified without thyrotoxic crisis or storm: Secondary | ICD-10-CM | POA: Diagnosis not present

## 2017-08-10 DIAGNOSIS — E559 Vitamin D deficiency, unspecified: Secondary | ICD-10-CM | POA: Diagnosis not present

## 2017-08-13 ENCOUNTER — Ambulatory Visit: Payer: Federal, State, Local not specified - PPO | Admitting: Cardiology

## 2017-08-13 DIAGNOSIS — Z13228 Encounter for screening for other metabolic disorders: Secondary | ICD-10-CM | POA: Diagnosis not present

## 2017-08-18 ENCOUNTER — Inpatient Hospital Stay (HOSPITAL_COMMUNITY): Admission: RE | Admit: 2017-08-18 | Payer: Federal, State, Local not specified - PPO | Source: Ambulatory Visit

## 2017-08-19 DIAGNOSIS — Z0000 Encounter for general adult medical examination without abnormal findings: Secondary | ICD-10-CM | POA: Diagnosis not present

## 2017-09-17 ENCOUNTER — Ambulatory Visit (HOSPITAL_COMMUNITY)
Admission: RE | Admit: 2017-09-17 | Discharge: 2017-09-17 | Disposition: A | Discharge status: Home / Self Care | Payer: Federal, State, Local not specified - PPO | Source: Ambulatory Visit | Attending: Internal Medicine | Admitting: Internal Medicine

## 2017-09-17 ENCOUNTER — Inpatient Hospital Stay (HOSPITAL_COMMUNITY): Admission: RE | Admit: 2017-09-17 | Payer: Federal, State, Local not specified - PPO | Source: Ambulatory Visit

## 2017-09-17 DIAGNOSIS — M79604 Pain in right leg: Secondary | ICD-10-CM | POA: Insufficient documentation

## 2017-09-18 DIAGNOSIS — Z01419 Encounter for gynecological examination (general) (routine) without abnormal findings: Secondary | ICD-10-CM | POA: Diagnosis not present

## 2017-09-18 DIAGNOSIS — Z124 Encounter for screening for malignant neoplasm of cervix: Secondary | ICD-10-CM | POA: Diagnosis not present

## 2017-09-18 DIAGNOSIS — Z6830 Body mass index (BMI) 30.0-30.9, adult: Secondary | ICD-10-CM | POA: Diagnosis not present

## 2017-10-07 ENCOUNTER — Telehealth: Payer: Self-pay | Admitting: Cardiology

## 2017-10-07 MED ORDER — ASPIRIN EC 81 MG PO TBEC: 81.0000 mg | DELAYED_RELEASE_TABLET | Freq: Every day | ORAL | 3 refills | Status: AC

## 2017-10-07 NOTE — Telephone Encounter (Signed)
Returned call to patient, she wanted clarification on instructions given on her [place] results.     Notes recorded by Minus Breeding, MD on 09/21/2017 at 10:10 AM EDT There is no evidence of any new clot. It was not clear prior to this whether the study showed new or old clot. She has treated with anticoagulation and has had six months. With the absence of any new clot on this study the most prudent step is to discontinue the Xarelto. She can be on ASA only. Call [name] with the results and send results to Laura Rosser, PA-C   Patient aware and verbalized understanding.  She states she continues to experience RLE pain, denies swelling or redness.   Advised to follow up with PCP.   Pt agreed  Med list updated.

## 2017-10-07 NOTE — Telephone Encounter (Signed)
New Message:   Pt would like the instructions reported since she was told to stop her blood thinner.

## 2017-10-15 ENCOUNTER — Ambulatory Visit: Payer: Federal, State, Local not specified - PPO | Admitting: Vascular Surgery

## 2017-10-15 ENCOUNTER — Inpatient Hospital Stay (HOSPITAL_COMMUNITY): Admission: RE | Admit: 2017-10-15 | Payer: Federal, State, Local not specified - PPO | Source: Ambulatory Visit

## 2017-11-07 ENCOUNTER — Other Ambulatory Visit: Payer: Self-pay | Admitting: Oncology

## 2017-11-07 DIAGNOSIS — Z9889 Other specified postprocedural states: Secondary | ICD-10-CM

## 2017-11-14 ENCOUNTER — Ambulatory Visit
Admission: RE | Admit: 2017-11-14 | Discharge: 2017-11-14 | Disposition: A | Discharge status: Home / Self Care | Payer: Federal, State, Local not specified - PPO | Source: Ambulatory Visit | Attending: Oncology | Admitting: Oncology

## 2017-11-14 DIAGNOSIS — Z9889 Other specified postprocedural states: Secondary | ICD-10-CM

## 2017-11-14 HISTORY — DX: Personal history of irradiation: Z92.3

## 2017-12-10 ENCOUNTER — Encounter: Payer: Self-pay | Admitting: Vascular Surgery

## 2017-12-10 ENCOUNTER — Ambulatory Visit: Payer: Federal, State, Local not specified - PPO | Admitting: Vascular Surgery

## 2017-12-10 ENCOUNTER — Ambulatory Visit (HOSPITAL_COMMUNITY)
Admission: RE | Admit: 2017-12-10 | Discharge: 2017-12-10 | Disposition: A | Discharge status: Home / Self Care | Payer: Federal, State, Local not specified - PPO | Source: Ambulatory Visit | Attending: Vascular Surgery | Admitting: Vascular Surgery

## 2017-12-10 ENCOUNTER — Other Ambulatory Visit: Payer: Self-pay

## 2017-12-10 VITALS — BP 131/91 | HR 66 | Temp 98.1°F | Resp 16 | Ht 66.5 in | Wt 187.0 lb

## 2017-12-10 DIAGNOSIS — Z95828 Presence of other vascular implants and grafts: Secondary | ICD-10-CM

## 2017-12-10 NOTE — Progress Notes (Signed)
Patient name: [name] MRN: [id number] DOB: 1955/09/30 Sex: female  REASON FOR VISIT:   Follow-up of IVC filter.  HPI:   [name] is a pleasant 62 y.o. female who I last saw on 12/14/2015.  This patient had a right lower extremity DVT in 2010.  She was on Coumadin but despite this had a pulmonary embolus.  This prompted placement of an IVC filter by interventional radiology on 05/09/2008.  A CT scan in 2017 showed that the filter was in good position although there were 2 struts that appeared to penetrate the inferior vena cava.  These did not appear to be causing any significant problems at that time.  Given that the filter had been placed many years ago I felt that removing the filter would be associated with significant risk.  It is not clear how long the struts had been protruding.  I recommended a follow-up CT of the abdomen in 1 year.  The patient missed that follow-up visit.  Of note, she was diagnosed with early breast cancer and underwent a lumpectomy and radiation therapy.  She was then very reluctant to consider a CT scan which we had previously ordered as she had gone to some classes on the radiation and learned that there was significant radiation associated with a CT scan.  I think she was understandably concerned about getting additional radiation.  Since I saw her last she denies any significant abdominal pain or back pain.  Her main complaint is right hip pain which occurs when she lies on her right side.  She also states that it is worse when she is under stress.  Her symptoms did not appear to be associated with walking.  I do not get any history of claudication, rest pain, or nonhealing ulcers.  She was diagnosed with a right popliteal DVT last year when she had her breast cancer and was treated with 6 months of Xarelto.  Past Medical History:  Diagnosis Date  . Anxiety   . Arthritis    neck  . Breast cancer ([place]) 2018   Right Breast Cancer  . Cancer [facility])      right breast cancer  . DVT (deep venous thrombosis) ([place])    04/2010  . GERD (gastroesophageal reflux disease)   . Headache(784.0) 09/22/2012  . Heart murmur    dx in the 90s - said that it comes and goes - cardiologist said it was a mild murmur  . History of hiatal hernia   . History of radiation therapy 02/05/17-03/05/17   right breast 40.05 Gy in 15 fractions, right breast boost 10 Gy in 5 fractions  . Hyperlipemia   . Hyperlipidemia   . Hypertension   . Hyperthyroidism   . Iron deficiency anemia   . Palpitations   . Personal history of radiation therapy   . Pulmonary embolism ([place])   . Uterine fibroid   . Vitamin D deficiency    Graves disease    Family History  Problem Relation Age of Onset  . Drug abuse Brother   . Hypertension Mother   . Colon cancer Maternal Grandfather     SOCIAL HISTORY: Social History   Tobacco Use  . Smoking status: Former Smoker    Packs/day: 0.40    Years: 10.00    Pack years: 4.00    Types: Cigarettes    Last attempt to quit: 06/01/2002    Years since quitting: 15.5  . Smokeless tobacco: Never Used  Substance Use Topics  .  Alcohol use: Yes    Comment: occasional 1 drink per month    Allergies  Allergen Reactions  . Amoxicillin-Pot Clavulanate Other (See Comments)    Dizziness   . Niacin-Lovastatin Er Other (See Comments)    Caused flushing     Current Outpatient Medications  Medication Sig Dispense Refill  . anastrozole (ARIMIDEX) 1 MG tablet Take 1 tablet (1 mg total) by mouth daily. 90 tablet 4  . Ascorbic Acid (VITAMIN C) 1000 MG tablet Take 1,000 mg by mouth daily.    Marland Kitchen aspirin EC 81 MG tablet Take 1 tablet (81 mg total) by mouth daily. 90 tablet 3  . atorvastatin (LIPITOR) 20 MG tablet Take 20 mg by mouth daily at 6 PM.     . Cholecalciferol (VITAMIN D-3 PO) Take 1 capsule by mouth daily.    Marland Kitchen LORazepam (ATIVAN) 0.5 MG tablet Take 0.5 mg by mouth 2 (two) times daily as needed for anxiety.   0  . Melatonin 10 MG TABS  Take 10 mg by mouth at bedtime as needed (for sleep).     . methimazole (TAPAZOLE) 5 MG tablet Take 10 mg by mouth daily.    . Omega-3 Fatty Acids (FISH OIL) 1200 MG CAPS Take 1 capsule by mouth daily.    Vladimir Faster Glycol-Propyl Glycol (SYSTANE OP) Place 1 drop into both eyes daily as needed (dry eyes).     Marland Kitchen albuterol (PROVENTIL HFA;VENTOLIN HFA) 108 (90 Base) MCG/ACT inhaler     . BIOTIN PO Take 1 tablet by mouth daily.    Marland Kitchen SALINE NASAL MIST NA Place 1-2 sprays into the nose as needed (for dryness).      No current facility-administered medications for this visit.     REVIEW OF SYSTEMS:  [X]  denotes positive finding, [ ]  denotes negative finding Cardiac  Comments:  Chest pain or chest pressure:    Shortness of breath upon exertion:    Short of breath when lying flat:    Irregular heart rhythm:        Vascular    Pain in calf, thigh, or hip brought on by ambulation: x   Pain in feet at night that wakes you up from your sleep:     Blood clot in your veins: x   Leg swelling:         Pulmonary    Oxygen at home:    Productive cough:     Wheezing:         Neurologic    Sudden weakness in arms or legs:     Sudden numbness in arms or legs:     Sudden onset of difficulty speaking or slurred speech:    Temporary loss of vision in one eye:     Problems with dizziness:         Gastrointestinal    Blood in stool:     Vomited blood:         Genitourinary    Burning when urinating:     Blood in urine:        Psychiatric    Major depression:         Hematologic    Bleeding problems:    Problems with blood clotting too easily:        Skin    Rashes or ulcers:        Constitutional    Fever or chills:     PHYSICAL EXAM:   Vitals:   12/10/17 1013  BP: (!) 131/91  Pulse: 66  Resp: 16  Temp: 98.1 F (36.7 C)  TempSrc: Oral  SpO2: 100%  Weight: 187 lb (84.8 kg)  Height: 5' 6.5" (1.689 m)    GENERAL: The patient is a well-nourished female, in no acute distress.  The vital signs are documented above. CARDIAC: There is a regular rate and rhythm.  VASCULAR: I do not detect carotid bruits. She has palpable femoral and pedal pulses bilaterally. She has no significant lower extremity swelling. PULMONARY: There is good air exchange bilaterally without wheezing or rales. ABDOMEN: Soft and non-tender with normal pitched bowel sounds.  MUSCULOSKELETAL: There are no major deformities or cyanosis. NEUROLOGIC: No focal weakness or paresthesias are detected. SKIN: There are no ulcers or rashes noted. PSYCHIATRIC: The patient has a normal affect.  DATA:    DUPLEX OF INFERIOR VENA CAVA: I have independently interpreted the duplex of the inferior vena cava.  This shows that her inferior vena cava is patent as are both common iliac veins.  MEDICAL ISSUES:   HISTORY OF IVC FILTER PLACEMENT: This patient had an IVC filter placed back in March 2010.  When I saw her 2 years ago there were 2 struts that were proved protruding outside of the cava and I recommended a follow-up CT in 1 year which she decided not to get.  She is willing to consider a CT scan now but would like to wait until March given that that will be 1 year after she had completed her radiation therapy for treatment of breast cancer.  I think her concerns are reasonable.  I have explained that the duplex study shows that her IVC and common iliac veins are patent but this does not get good visualization in detail of her IVC filter.  However she is asymptomatic from this standpoint and I think this is a reasonable approach.  I will see her back in March after her CT scan.  With respect to her hip pain I think this is most likely related to arthritis or could potentially be related to degenerative disc disease of her back.  The CT scan of of her abdomen and pelvis will also help [place] evaluate her back.  Deitra Mayo [facility] [phone]

## 2017-12-12 NOTE — Progress Notes (Signed)
[place]  [phone] [phone]     ID: [name] DOB: January 17, 1956  MR#: [id number]  VJD#:[id number]  Patient Care Team: Shanon Rosser, PA-C as PCP - General (Physician Assistant) Rolm Bookbinder, MD as Consulting Physician (General Surgery) , Virgie Dad, MD as Consulting Physician (Oncology) Gery Pray, MD as Consulting Physician (Radiation Oncology) OTHER MD:  CHIEF COMPLAINT: Estrogen receptor positive breast cancer  CURRENT TREATMENT: anastrozole  HISTORY OF CURRENT ILLNESS: From the original intake note:  [name] had routine bilateral screening mammography with tomography at the [facility] 10/08/2016. An area of possible distortion in the right breast was noted. She was recalled for right diagnostic mammography with tomography on 11/08/2016. This found the breast density to be category C. The small area of architectural distortion in the lateral right breast persisted and on 11/11/2016 biopsy of this area showed ([id number]) invasive ductal carcinoma, E-cadherin positive, estrogen receptor 95% positive with strong staining intensity, progesterone receptor 5% positive, with strong staining intensity, with an MIB-1 of 10%, and no HER-2 amplification, with a signals ratio of 1.51 and number per cell 3.40. Right axillary ultrasound 11/13/2016 was sonographically benign.  Of note, she has a history of DVT diagnosed March 2010, felt to be possibly related to estrogen replacement therapy. She was coumadinized but developed a pulmonary embolus while on Coumadin. She was then had an IVC filter placed and was anticoagulated with Lovenox for 2 years. An extensive hypercoagulable workup was negative except for an elevated factor VIII level. Her IVC filter is still in place and the patient tells me she is participating in a lawsuit regarding that.   The patient's subsequent history is as detailed below.  INTERVAL HISTORY: [name] returns  today for follow up and treatment of her estrogen receptor positive breast cancer. She continues on anastrozole which she tolerates well. She denies hot flashes or  vaginal dryness.  Since her last visit she has had an Left diagnostic mammogram with [place] on 08/01/17 that showed:  No mammographic sonographic evidence of malignancy in the left breast.   She also had a bilateral diagnostic mammogram on 11/14/17 that showed: Expected surgical changes in the lateral right breast consistent with lumpectomy. No mammographic evidence of malignancy in the bilateral breasts.      REVIEW OF SYSTEMS: [name] reports that for exercise, she has not been exercising like she should have. She goes to ballroom dance classes on mondays, doesn't know what dance specialty she wants to pursue. She went to [employer] but notices that she doesn't sweat much and feels bad when she doesn't sweat. She has outside family stressors, she has an 9 yr old grandmother who is alive and living well independently. Her grandmother slipped off the bed and cracked her pelvic bone. Her house aid moved out of her house and is now stuck living at nursing home. Her niece took grandmother out of nursing home and is family is trying to use grandmother for her money. She denies unusual headaches, visual changes, nausea, vomiting, or dizziness. There has been no unusual cough, phlegm production, or pleurisy. This been no change in bowel or bladder habits. She denies unexplained fatigue or unexplained weight loss, bleeding, rash, or fever. A detailed review of systems was otherwise stable.    PAST MEDICAL HISTORY: Past Medical History:  Diagnosis Date  . Anxiety   . Arthritis    neck  . Breast cancer ([place]) 2018   Right Breast Cancer  . Cancer ([place])  right breast cancer  . DVT (deep venous thrombosis) ([place])    04/2010  . GERD (gastroesophageal reflux disease)   . Headache(784.0) 09/22/2012  . Heart murmur    dx in the 90s - said that  it comes and goes - cardiologist said it was a mild murmur  . History of hiatal hernia   . History of radiation therapy 02/05/17-03/05/17   right breast 40.05 Gy in 15 fractions, right breast boost 10 Gy in 5 fractions  . Hyperlipemia   . Hyperlipidemia   . Hypertension   . Hyperthyroidism   . Iron deficiency anemia   . Palpitations   . Personal history of radiation therapy   . Pulmonary embolism ([place])   . Uterine fibroid   . Vitamin D deficiency    Graves disease    PAST SURGICAL HISTORY: Past Surgical History:  Procedure Laterality Date  . BREAST LUMPECTOMY Right 12/16/2016  . BREAST LUMPECTOMY WITH RADIOACTIVE SEED AND SENTINEL LYMPH NODE BIOPSY Right 12/16/2016   Procedure: RIGHT BREAST LUMPECTOMY WITH RADIOACTIVE SEED AND SENTINEL LYMPH NODE BIOPSY;  Surgeon: Rolm Bookbinder, MD;  Location: [facility];  Service: General;  Laterality: Right;  . CHOLECYSTECTOMY    . COLONOSCOPY    . DILATION AND CURETTAGE OF UTERUS     01/2008  . ESOPHAGOGASTRODUODENOSCOPY    . ivc filter      FAMILY HISTORY Family History  Problem Relation Age of Onset  . Drug abuse Brother   . Hypertension Mother   . Colon cancer Maternal Grandfather   She notes that her father died at age 78 from an accidental head injury.The patient's mother is 58 years old as of October 2018. Pt has one brother and no sisters. Pt reports that a second cousin has a hx of breast cancer and was dx in her 8's. Pt denies family hx of ovarian cancer.   GYNECOLOGIC HISTORY:  Patient's last menstrual period was 06/18/2010. Menarche: 62 years old Age at first live birth: No children GP: GXP0 LMP: March 2016 Contraceptive: OCP on and off for many years d/c after DVT and PE diagnosis.  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 She denies having pets at home.     ADVANCED DIRECTIVES: Not in place. At the 11/20/2016 visit the patient was given the appropriate documents  to complete and notarize at her discretion   HEALTH MAINTENANCE: Social History   Tobacco Use  . Smoking status: Former Smoker    Packs/day: 0.40    Years: 10.00    Pack years: 4.00    Types: Cigarettes    Last attempt to quit: 06/01/2002    Years since quitting: 15.5  . Smokeless tobacco: Never Used  Substance Use Topics  . Alcohol use: Yes    Comment: occasional 1 drink per month  . Drug use: No     Colonoscopy: 2013  PAP: 08/30/2016   Bone density: 10/04/2015 with T-score of -0.3 at femur neck right   Allergies  Allergen Reactions  . Amoxicillin-Pot Clavulanate Other (See Comments)    Dizziness   . Niacin-Lovastatin Er Other (See Comments)    Caused flushing     Current Outpatient Medications  Medication Sig Dispense Refill  . albuterol (PROVENTIL HFA;VENTOLIN HFA) 108 (90 Base) MCG/ACT inhaler     . anastrozole (ARIMIDEX) 1 MG tablet Take 1 tablet (1 mg total) by mouth daily. 90 tablet 4  . Ascorbic Acid (VITAMIN  C) 1000 MG tablet Take 1,000 mg by mouth daily.    Marland Kitchen aspirin EC 81 MG tablet Take 1 tablet (81 mg total) by mouth daily. 90 tablet 3  . atorvastatin (LIPITOR) 20 MG tablet Take 20 mg by mouth daily at 6 PM.     . BIOTIN PO Take 1 tablet by mouth daily.    . cholecalciferol (VITAMIN D) 1000 units tablet Take 1 tablet (1,000 Units total) by mouth daily.    . Cholecalciferol (VITAMIN D-3 PO) Take 1 capsule by mouth daily.    Marland Kitchen LORazepam (ATIVAN) 0.5 MG tablet Take 0.5 mg by mouth 2 (two) times daily as needed for anxiety.   0  . Melatonin 10 MG TABS Take 10 mg by mouth at bedtime as needed (for sleep).     . methimazole (TAPAZOLE) 5 MG tablet Take 10 mg by mouth daily.    . Omega-3 Fatty Acids (FISH OIL) 1200 MG CAPS Take 1 capsule by mouth daily.    Vladimir Faster Glycol-Propyl Glycol (SYSTANE OP) Place 1 drop into both eyes daily as needed (dry eyes).     . SALINE NASAL MIST NA Place 1-2 sprays into the nose as needed (for dryness).      No current  facility-administered medications for this visit.     OBJECTIVE: Middle-aged African-American woman who appears well  Vitals:   12/15/17 1132  BP: 130/84  Pulse: 68  Resp: 18  Temp: 98.3 F (36.8 C)  SpO2: 100%     Body mass index is 30.73 kg/m.   Wt Readings from Last 3 Encounters:  12/15/17 193 lb 4.8 oz (87.7 kg)  12/10/17 187 lb (84.8 kg)  06/30/17 186 lb (84.4 kg)      ECOG FS:0 - Asymptomatic  Sclerae unicteric, pupils round and equal Oropharynx clear and moist No cervical or supraclavicular adenopathy Lungs no rales or rhonchi Heart regular rate and rhythm Abd soft, nontender, positive bowel sounds MSK no focal spinal tenderness, no upper extremity lymphedema Neuro: nonfocal, well oriented, appropriate affect Breasts: The right breast is status post lumpectomy and radiation.  There is no evidence of disease recurrence.  The left breast is benign.  Both axillae are benign  LAB RESULTS:  CMP     Component Value Date/Time   NA 140 12/15/2017 1110   NA 141 11/20/2016 0822   K 4.0 12/15/2017 1110   K 3.6 11/20/2016 0822   CL 106 12/15/2017 1110   CL 105 06/24/2012 1548   CO2 26 12/15/2017 1110   CO2 24 11/20/2016 0822   GLUCOSE 100 (H) 12/15/2017 1110   GLUCOSE 102 11/20/2016 0822   GLUCOSE 91 06/24/2012 1548   BUN 14 12/15/2017 1110   BUN 19.2 11/20/2016 0822   CREATININE 0.98 12/15/2017 1110   CREATININE 0.9 11/20/2016 0822   CALCIUM 9.9 12/15/2017 1110   CALCIUM 9.5 11/20/2016 0822   PROT 8.1 12/15/2017 1110   PROT 7.9 03/21/2017 1141   PROT 8.2 11/20/2016 0822   ALBUMIN 3.9 12/15/2017 1110   ALBUMIN 4.3 03/21/2017 1141   ALBUMIN 3.8 11/20/2016 0822   AST 18 12/15/2017 1110   AST 15 11/20/2016 0822   ALT 19 12/15/2017 1110   ALT 15 11/20/2016 0822   ALKPHOS 150 (H) 12/15/2017 1110   ALKPHOS 122 11/20/2016 0822   BILITOT 0.5 12/15/2017 1110   BILITOT 0.5 03/21/2017 1141   BILITOT 0.52 11/20/2016 0822   GFRNONAA >60 12/15/2017 1110   GFRAA >60  12/15/2017 1110  No results found for: TOTALPROTELP, ALBUMINELP, A1GS, A2GS, BETS, BETA2SER, GAMS, MSPIKE, SPEI  No results found for: KPAFRELGTCHN, LAMBDASER, [facility]  Lab Results  Component Value Date   WBC 4.7 12/15/2017   NEUTROABS 2.4 12/15/2017   HGB 12.2 12/15/2017   HCT 37.6 12/15/2017   MCV 98.7 12/15/2017   PLT 162 12/15/2017    _0 @  Lab Results  Component Value Date   LABCA2 17 04/26/2008    No components found for: [id number]  No results for input(s): INR in the last 168 hours.  Lab Results  Component Value Date   LABCA2 17 04/26/2008    No results found for: SVX793  No results found for: JQZ009  No results found for: QZR007  No results found for: CA2729  No components found for: HGQUANT  No results found for: CEA1 / No results found for: CEA1   No results found for: AFPTUMOR  No results found for: Edna  No results found for: PSA1  Appointment on 12/15/2017  Component Date Value Ref Range Status  . Sodium 12/15/2017 140  135 - 145 mmol/L Final  . Potassium 12/15/2017 4.0  3.5 - 5.1 mmol/L Final  . Chloride 12/15/2017 106  98 - 111 mmol/L Final  . CO2 12/15/2017 26  22 - 32 mmol/L Final  . Glucose, Bld 12/15/2017 100* 70 - 99 mg/dL Final  . BUN 12/15/2017 14  6 - 20 mg/dL Final  . Creatinine, Ser 12/15/2017 0.98  0.44 - 1.00 mg/dL Final  . Calcium 12/15/2017 9.9  8.9 - 10.3 mg/dL Final  . Total Protein 12/15/2017 8.1  6.5 - 8.1 g/dL Final  . Albumin 12/15/2017 3.9  3.5 - 5.0 g/dL Final  . AST 12/15/2017 18  15 - 41 U/L Final  . ALT 12/15/2017 19  0 - 44 U/L Final  . Alkaline Phosphatase 12/15/2017 150* 38 - 126 U/L Final  . Total Bilirubin 12/15/2017 0.5  0.3 - 1.2 mg/dL Final  . GFR calc non Af Amer 12/15/2017 >60  >60 mL/min Final  . GFR calc Af Amer 12/15/2017 >60  >60 mL/min Final   Comment: (NOTE) The eGFR has been calculated using the CKD EPI equation. This calculation has not been validated in all  clinical situations. eGFR's persistently <60 mL/min signify possible Chronic Kidney Disease.   Georgiann Hahn gap 12/15/2017 8  5 - 15 Final   Performed at [facility], [place] [address] [place], [place] [address]  . WBC 12/15/2017 4.7  4.0 - 10.5 K/uL Final  . RBC 12/15/2017 3.81* 3.87 - 5.11 MIL/uL Final  . Hemoglobin 12/15/2017 12.2  12.0 - 15.0 g/dL Final  . HCT 12/15/2017 37.6  36.0 - 46.0 % Final  . MCV 12/15/2017 98.7  80.0 - 100.0 fL Final  . MCH 12/15/2017 32.0  26.0 - 34.0 pg Final  . MCHC 12/15/2017 32.4  30.0 - 36.0 g/dL Final  . RDW 12/15/2017 12.6  11.5 - 15.5 % Final  . Platelets 12/15/2017 162  150 - 400 K/uL Final  . nRBC 12/15/2017 0.0  0.0 - 0.2 % Final  . Neutrophils Relative % 12/15/2017 51  % Final  . Neutro Abs 12/15/2017 2.4  1.7 - 7.7 K/uL Final  . Lymphocytes Relative 12/15/2017 36  % Final  . Lymphs Abs 12/15/2017 1.7  0.7 - 4.0 K/uL Final  . Monocytes Relative 12/15/2017 7  % Final  . Monocytes Absolute 12/15/2017 0.3  0.1 - 1.0 K/uL Final  . Eosinophils Relative  12/15/2017 5  % Final  . Eosinophils Absolute 12/15/2017 0.2  0.0 - 0.5 K/uL Final  . Basophils Relative 12/15/2017 1  % Final  . Basophils Absolute 12/15/2017 0.0  0.0 - 0.1 K/uL Final  . Immature Granulocytes 12/15/2017 0  % Final  . Abs Immature Granulocytes 12/15/2017 0.01  0.00 - 0.07 K/uL Final   Performed at [facility], [address] [place], [place] [phone]    (this displays the last labs from the last 3 days)  No results found for: TOTALPROTELP, ALBUMINELP, A1GS, A2GS, BETS, BETA2SER, GAMS, MSPIKE, SPEI (this displays SPEP labs)  No results found for: KPAFRELGTCHN, LAMBDASER, KAPLAMBRATIO (kappa/lambda light chains)  No results found for: HGBA, HGBA2QUANT, HGBFQUANT, HGBSQUAN (Hemoglobinopathy evaluation)   No results found for: LDH  Lab Results  Component Value Date   IRON 61 12/25/2012   TIBC 331 12/25/2012   IRONPCTSAT 18  (L) 12/25/2012   (Iron and TIBC)  Lab Results  Component Value Date   FERRITIN 31 12/25/2012    Urinalysis    Component Value Date/Time   COLORURINE LT YELLOW 04/18/2008 1220   APPEARANCEUR Clear 04/18/2008 1220   LABSPEC > OR = 1.030 04/18/2008 1220   PHURINE 5.5 04/18/2008 1220   GLUCOSEU NEGATIVE 04/18/2008 1220   BILIRUBINUR NEGATIVE 04/18/2008 1220   KETONESUR NEGATIVE 04/18/2008 1220   UROBILINOGEN 0.2 mg/dL 04/18/2008 1220   NITRITE Negative 04/18/2008 1220   LEUKOCYTESUR Negative 04/18/2008 1220     STUDIES: Mammographic report discussed with the patient  ELIGIBLE FOR AVAILABLE RESEARCH PROTOCOL: no  ASSESSMENT: 62 y.o. [place] woman status post right breast lower outer quadrant biopsy 11/11/2016 for a clinical TX N), stage I invasive ductal carcinoma, grade 2, E-cadherin positive, estrogen receptor 95% positive, progesterone receptor 5% positive, with an MIB-1 of 10%, and no HER-2 amplification  (1) Status post right lumpectomy and right axillary sentinel lymph node sampling 12/16/2016 for a pT1c pN0, stage IA invasive ductal carcinoma, grade 1, with negative margins.  Total of 3 sentinel lymph nodes removed  (2) The Oncotype DX score was 18, predicting a risk of outside the breast recurrence over the next 10 years of 11% if the patient's only systemic therapy is tamoxifen for 5 years.  It also predicts no benefit from chemotherapy.  (3) Adjuvant radiation 02/05/2017-03/05/2017 Site/dose:    1. Right breast, 2.67 Gy in 15 fractions for a total dose of 40.05 Gy                    2. Right breast boost, 2 Gy in 5 fractions for a total dose of 10 Gy   (4) anastrozole started February 2019  (a) not a good tamoxifen candidate given problem #5  (b) bone density at breast center 10/04/2015 with T-score of -0.3 (normal)  (5) history of DVT/PE March 2010, with negative extensive hypercoagulable workup, IVC filter in place  PLAN: [name] is now a year out from definitive  surgery for her breast cancer with no evidence of disease recurrence.  This is favorable.  She is tolerating anastrozole well and the plan will be to continue that a total of 5 years.  She will need a repeat bone density at some point next year.  She is currently very stressed because of her grandmother situation.  We discussed that at length today.  Her grandmother lives in [place] which makes it even plicated  Breast cancer wise though [name] is doing well.  She knows to call  for any other issues that may develop before the next visit.  , Virgie Dad, MD  12/15/17 11:56 AM Medical Oncology and Hematology [facility] [address] [place], [place] [address] Tel. [phone]    Fax. [phone]    Elie Goody, am acting as scribe for Dr. Virgie Dad. .  I, Lurline Del MD, have reviewed the above documentation for accuracy and completeness, and I agree with the above.

## 2017-12-15 ENCOUNTER — Inpatient Hospital Stay: Payer: Federal, State, Local not specified - PPO

## 2017-12-15 ENCOUNTER — Inpatient Hospital Stay: Payer: Federal, State, Local not specified - PPO | Attending: Oncology | Admitting: Oncology

## 2017-12-15 ENCOUNTER — Telehealth: Payer: Self-pay | Admitting: Oncology

## 2017-12-15 ENCOUNTER — Ambulatory Visit (HOSPITAL_COMMUNITY)
Admission: RE | Admit: 2017-12-15 | Discharge: 2017-12-15 | Disposition: A | Discharge status: Home / Self Care | Payer: Federal, State, Local not specified - PPO | Source: Ambulatory Visit | Attending: Oncology | Admitting: Oncology

## 2017-12-15 ENCOUNTER — Other Ambulatory Visit: Payer: Self-pay | Admitting: Oncology

## 2017-12-15 VITALS — BP 130/84 | HR 68 | Temp 98.3°F | Resp 18 | Ht 66.5 in | Wt 193.3 lb

## 2017-12-15 DIAGNOSIS — Z7982 Long term (current) use of aspirin: Secondary | ICD-10-CM

## 2017-12-15 DIAGNOSIS — Z87891 Personal history of nicotine dependence: Secondary | ICD-10-CM

## 2017-12-15 DIAGNOSIS — Z170 Estrogen receptor positive status [ER+]: Secondary | ICD-10-CM | POA: Insufficient documentation

## 2017-12-15 DIAGNOSIS — C50511 Malignant neoplasm of lower-outer quadrant of right female breast: Secondary | ICD-10-CM | POA: Diagnosis present

## 2017-12-15 DIAGNOSIS — Z7901 Long term (current) use of anticoagulants: Secondary | ICD-10-CM | POA: Insufficient documentation

## 2017-12-15 DIAGNOSIS — D508 Other iron deficiency anemias: Secondary | ICD-10-CM

## 2017-12-15 DIAGNOSIS — I10 Essential (primary) hypertension: Secondary | ICD-10-CM | POA: Diagnosis not present

## 2017-12-15 DIAGNOSIS — Z86718 Personal history of other venous thrombosis and embolism: Secondary | ICD-10-CM | POA: Diagnosis not present

## 2017-12-15 DIAGNOSIS — Z79899 Other long term (current) drug therapy: Secondary | ICD-10-CM | POA: Insufficient documentation

## 2017-12-15 DIAGNOSIS — I2699 Other pulmonary embolism without acute cor pulmonale: Secondary | ICD-10-CM

## 2017-12-15 DIAGNOSIS — Z86711 Personal history of pulmonary embolism: Secondary | ICD-10-CM

## 2017-12-15 LAB — CBC WITH DIFFERENTIAL/PLATELET
Abs Immature Granulocytes: 0.01 10*3/uL (ref 0.00–0.07)
BASOS PCT: 1 %
Basophils Absolute: 0 10*3/uL (ref 0.0–0.1)
EOS ABS: 0.2 10*3/uL (ref 0.0–0.5)
Eosinophils Relative: 5 %
HCT: 37.6 % (ref 36.0–46.0)
Hemoglobin: 12.2 g/dL (ref 12.0–15.0)
IMMATURE GRANULOCYTES: 0 %
Lymphocytes Relative: 36 %
Lymphs Abs: 1.7 10*3/uL (ref 0.7–4.0)
MCH: 32 pg (ref 26.0–34.0)
MCHC: 32.4 g/dL (ref 30.0–36.0)
MCV: 98.7 fL (ref 80.0–100.0)
MONO ABS: 0.3 10*3/uL (ref 0.1–1.0)
MONOS PCT: 7 %
NEUTROS PCT: 51 %
Neutro Abs: 2.4 10*3/uL (ref 1.7–7.7)
PLATELETS: 162 10*3/uL (ref 150–400)
RBC: 3.81 MIL/uL — ABNORMAL LOW (ref 3.87–5.11)
RDW: 12.6 % (ref 11.5–15.5)
WBC: 4.7 10*3/uL (ref 4.0–10.5)
nRBC: 0 % (ref 0.0–0.2)

## 2017-12-15 LAB — COMPREHENSIVE METABOLIC PANEL
ALBUMIN: 3.9 g/dL (ref 3.5–5.0)
ALK PHOS: 150 U/L — AB (ref 38–126)
ALT: 19 U/L (ref 0–44)
AST: 18 U/L (ref 15–41)
Anion gap: 8 (ref 5–15)
BILIRUBIN TOTAL: 0.5 mg/dL (ref 0.3–1.2)
BUN: 14 mg/dL (ref 6–20)
CO2: 26 mmol/L (ref 22–32)
Calcium: 9.9 mg/dL (ref 8.9–10.3)
Chloride: 106 mmol/L (ref 98–111)
Creatinine, Ser: 0.98 mg/dL (ref 0.44–1.00)
GFR calc Af Amer: >60 mL/min (ref >60)
GFR calc non Af Amer: >60 mL/min (ref >60)
GLUCOSE: 100 mg/dL — AB (ref 70–99)
Potassium: 4 mmol/L (ref 3.5–5.1)
Sodium: 140 mmol/L (ref 135–145)
Total Protein: 8.1 g/dL (ref 6.5–8.1)

## 2017-12-15 MED ORDER — ANASTROZOLE 1 MG PO TABS: 1.0000 mg | ORAL_TABLET | Freq: Every day | ORAL | 4 refills | Status: DC

## 2017-12-15 NOTE — Progress Notes (Signed)
[name]  [phone] [phone]     ID: [name] DOB: [birth year]  MR#: [id number]  DDU#:[id number]  Patient Care Team: Shanon Rosser, PA-C as PCP - General (Physician Assistant) Rolm Bookbinder, MD as Consulting Physician (General Surgery) Ashly Goethe, Virgie Dad, MD as Consulting Physician (Oncology) Gery Pray, MD as Consulting Physician (Radiation Oncology) OTHER MD:  CHIEF COMPLAINT: Estrogen receptor positive breast cancer  CURRENT TREATMENT: anastrozole  HISTORY OF CURRENT ILLNESS: From the original intake note:  [name] had routine bilateral screening mammography with tomography at the [facility] 10/08/2016. An area of possible distortion in the right breast was noted. She was recalled for right diagnostic mammography with tomography on 11/08/2016. This found the breast density to be category C. The small area of architectural distortion in the lateral right breast persisted and on 11/11/2016 biopsy of this area showed ([id number]) invasive ductal carcinoma, E-cadherin positive, estrogen receptor 95% positive with strong staining intensity, progesterone receptor 5% positive, with strong staining intensity, with an MIB-1 of 10%, and no HER-2 amplification, with a signals ratio of 1.51 and number per cell 3.40. Right axillary ultrasound 11/13/2016 was sonographically benign.  Of note, she has a history of DVT diagnosed March 2010, felt to be possibly related to estrogen replacement therapy. She was coumadinized but developed a pulmonary embolus while on Coumadin. She was then had an IVC filter placed and was anticoagulated with Lovenox for 2 years. An extensive hypercoagulable workup was negative except for an elevated factor VIII level. Her IVC filter is still in place and the patient tells me she is participating in a lawsuit regarding that.   The patient's subsequent history is as detailed below.  INTERVAL HISTORY: [name] returns  today for follow up and treatment of her estrogen receptor positive breast cancer. She continues on anastrozole which she tolerates well. She denies hot flashes or  vaginal dryness.  Since her last visit she has had an Left diagnostic mammogram with [place] on 08/01/17 that showed:  No mammographic sonographic evidence of malignancy in the left breast.   She also had a bilateral diagnostic mammogram on 11/14/17 that showed: Expected surgical changes in the lateral right breast consistent with lumpectomy. No mammographic evidence of malignancy in the bilateral breasts.      REVIEW OF SYSTEMS: Breuna reports that for exercise, she has not been exercising like she should have. She goes to ballroom dance classes on mondays, doesn't know what dance specialty she wants to pursue. She went to [employer] but notices that she doesn't sweat much and feels bad when she doesn't sweat. She has outside family stressors, she has an 86 yr old grandmother who is alive and living well independently. Her grandmother slipped off the bed and cracked her pelvic bone. Her house aid moved out of her house and is now stuck living at nursing home. Her niece took grandmother out of nursing home and is family is trying to use grandmother for her money. She denies unusual headaches, visual changes, nausea, vomiting, or dizziness. There has been no unusual cough, phlegm production, or pleurisy. This been no change in bowel or bladder habits. She denies unexplained fatigue or unexplained weight loss, bleeding, rash, or fever. A detailed review of systems was otherwise stable.    PAST MEDICAL HISTORY: Past Medical History:  Diagnosis Date  . Anxiety   . Arthritis    neck  . Breast cancer ([place]) 2018   Right Breast Cancer  . Cancer ([place])  right breast cancer  . DVT (deep venous thrombosis) ([place])    04/2010  . GERD (gastroesophageal reflux disease)   . Headache(784.0) 09/22/2012  . Heart murmur    dx in the 90s - said that  it comes and goes - cardiologist said it was a mild murmur  . History of hiatal hernia   . History of radiation therapy 02/05/17-03/05/17   right breast 40.05 Gy in 15 fractions, right breast boost 10 Gy in 5 fractions  . Hyperlipemia   . Hyperlipidemia   . Hypertension   . Hyperthyroidism   . Iron deficiency anemia   . Palpitations   . Personal history of radiation therapy   . Pulmonary embolism ([place])   . Uterine fibroid   . Vitamin D deficiency    Graves disease    PAST SURGICAL HISTORY: Past Surgical History:  Procedure Laterality Date  . BREAST LUMPECTOMY Right 12/16/2016  . BREAST LUMPECTOMY WITH RADIOACTIVE SEED AND SENTINEL LYMPH NODE BIOPSY Right 12/16/2016   Procedure: RIGHT BREAST LUMPECTOMY WITH RADIOACTIVE SEED AND SENTINEL LYMPH NODE BIOPSY;  Surgeon: Rolm Bookbinder, MD;  Location: [facility];  Service: General;  Laterality: Right;  . CHOLECYSTECTOMY    . COLONOSCOPY    . DILATION AND CURETTAGE OF UTERUS     01/2008  . ESOPHAGOGASTRODUODENOSCOPY    . ivc filter      FAMILY HISTORY Family History  Problem Relation Age of Onset  . Drug abuse Brother   . Hypertension Mother   . Colon cancer Maternal Grandfather   She notes that her father died at age 60 from an accidental head injury.The patient's mother is 58 years old as of October 2018. Pt has one brother and no sisters. Pt reports that a second cousin has a hx of breast cancer and was dx in her 60's. Pt denies family hx of ovarian cancer.   GYNECOLOGIC HISTORY:  Patient's last menstrual period was 06/18/2010. Menarche: 62 years old Age at first live birth: No children GP: GXP0 LMP: March 2016 Contraceptive: OCP on and off for many years d/c after DVT and PE diagnosis.  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 She denies having pets at home.     ADVANCED DIRECTIVES: Not in place. At the 11/20/2016 visit the patient was given the appropriate documents  to complete and notarize at her discretion   HEALTH MAINTENANCE: Social History   Tobacco Use  . Smoking status: Former Smoker    Packs/day: 0.40    Years: 10.00    Pack years: 4.00    Types: Cigarettes    Last attempt to quit: 06/01/2002    Years since quitting: 15.5  . Smokeless tobacco: Never Used  Substance Use Topics  . Alcohol use: Yes    Comment: occasional 1 drink per month  . Drug use: No     Colonoscopy: 2013  PAP: 08/30/2016   Bone density: 10/04/2015 with T-score of -0.3 at femur neck right   Allergies  Allergen Reactions  . Amoxicillin-Pot Clavulanate Other (See Comments)    Dizziness   . Niacin-Lovastatin Er Other (See Comments)    Caused flushing     Current Outpatient Medications  Medication Sig Dispense Refill  . albuterol (PROVENTIL HFA;VENTOLIN HFA) 108 (90 Base) MCG/ACT inhaler     . anastrozole (ARIMIDEX) 1 MG tablet Take 1 tablet (1 mg total) by mouth daily. 90 tablet 4  . Ascorbic Acid (VITAMIN  C) 1000 MG tablet Take 1,000 mg by mouth daily.    Marland Kitchen aspirin EC 81 MG tablet Take 1 tablet (81 mg total) by mouth daily. 90 tablet 3  . atorvastatin (LIPITOR) 20 MG tablet Take 20 mg by mouth daily at 6 PM.     . BIOTIN PO Take 1 tablet by mouth daily.    . cholecalciferol (VITAMIN D) 1000 units tablet Take 1 tablet (1,000 Units total) by mouth daily.    . Cholecalciferol (VITAMIN D-3 PO) Take 1 capsule by mouth daily.    Marland Kitchen LORazepam (ATIVAN) 0.5 MG tablet Take 0.5 mg by mouth 2 (two) times daily as needed for anxiety.   0  . Melatonin 10 MG TABS Take 10 mg by mouth at bedtime as needed (for sleep).     . methimazole (TAPAZOLE) 5 MG tablet Take 10 mg by mouth daily.    . Omega-3 Fatty Acids (FISH OIL) 1200 MG CAPS Take 1 capsule by mouth daily.    Vladimir Faster Glycol-Propyl Glycol (SYSTANE OP) Place 1 drop into both eyes daily as needed (dry eyes).     . SALINE NASAL MIST NA Place 1-2 sprays into the nose as needed (for dryness).      No current  facility-administered medications for this visit.     OBJECTIVE: Middle-aged African-American woman who appears well  There were no vitals filed for this visit.   There is no height or weight on file to calculate BMI.   Wt Readings from Last 3 Encounters:  12/15/17 193 lb 4.8 oz (87.7 kg)  12/10/17 187 lb (84.8 kg)  06/30/17 186 lb (84.4 kg)      ECOG FS:0 - Asymptomatic  Sclerae unicteric, pupils round and equal Oropharynx clear and moist No cervical or supraclavicular adenopathy Lungs no rales or rhonchi Heart regular rate and rhythm Abd soft, nontender, positive bowel sounds MSK no focal spinal tenderness, no upper extremity lymphedema Neuro: nonfocal, well oriented, appropriate affect Breasts: The right breast is status post lumpectomy and radiation.  There is no evidence of disease recurrence.  The left breast is benign.  Both axillae are benign  LAB RESULTS:  CMP     Component Value Date/Time   NA 140 12/15/2017 1110   NA 141 11/20/2016 0822   K 4.0 12/15/2017 1110   K 3.6 11/20/2016 0822   CL 106 12/15/2017 1110   CL 105 06/24/2012 1548   CO2 26 12/15/2017 1110   CO2 24 11/20/2016 0822   GLUCOSE 100 (H) 12/15/2017 1110   GLUCOSE 102 11/20/2016 0822   GLUCOSE 91 06/24/2012 1548   BUN 14 12/15/2017 1110   BUN 19.2 11/20/2016 0822   CREATININE 0.98 12/15/2017 1110   CREATININE 0.9 11/20/2016 0822   CALCIUM 9.9 12/15/2017 1110   CALCIUM 9.5 11/20/2016 0822   PROT 8.1 12/15/2017 1110   PROT 7.9 03/21/2017 1141   PROT 8.2 11/20/2016 0822   ALBUMIN 3.9 12/15/2017 1110   ALBUMIN 4.3 03/21/2017 1141   ALBUMIN 3.8 11/20/2016 0822   AST 18 12/15/2017 1110   AST 15 11/20/2016 0822   ALT 19 12/15/2017 1110   ALT 15 11/20/2016 0822   ALKPHOS 150 (H) 12/15/2017 1110   ALKPHOS 122 11/20/2016 0822   BILITOT 0.5 12/15/2017 1110   BILITOT 0.5 03/21/2017 1141   BILITOT 0.52 11/20/2016 0822   GFRNONAA >60 12/15/2017 1110   GFRAA >60 12/15/2017 1110    No results  found for: TOTALPROTELP, ALBUMINELP, A1GS, A2GS, BETS, BETA2SER, GAMS,  MSPIKE, SPEI  No results found for: Nils Pyle, [facility]  Lab Results  Component Value Date   WBC 4.7 12/15/2017   NEUTROABS 2.4 12/15/2017   HGB 12.2 12/15/2017   HCT 37.6 12/15/2017   MCV 98.7 12/15/2017   PLT 162 12/15/2017    '@LASTCHEMISTRY'$ @  Lab Results  Component Value Date   LABCA2 17 04/26/2008    No components found for: [id number]  No results for input(s): INR in the last 168 hours.  Lab Results  Component Value Date   LABCA2 17 04/26/2008    No results found for: YTK160  No results found for: FUX323  No results found for: FTD322  No results found for: CA2729  No components found for: HGQUANT  No results found for: CEA1 / No results found for: CEA1   No results found for: AFPTUMOR  No results found for: [place]  No results found for: PSA1  Appointment on 12/15/2017  Component Date Value Ref Range Status  . Sodium 12/15/2017 140  135 - 145 mmol/L Final  . Potassium 12/15/2017 4.0  3.5 - 5.1 mmol/L Final  . Chloride 12/15/2017 106  98 - 111 mmol/L Final  . CO2 12/15/2017 26  22 - 32 mmol/L Final  . Glucose, Bld 12/15/2017 100* 70 - 99 mg/dL Final  . BUN 12/15/2017 14  6 - 20 mg/dL Final  . Creatinine, Ser 12/15/2017 0.98  0.44 - 1.00 mg/dL Final  . Calcium 12/15/2017 9.9  8.9 - 10.3 mg/dL Final  . Total Protein 12/15/2017 8.1  6.5 - 8.1 g/dL Final  . Albumin 12/15/2017 3.9  3.5 - 5.0 g/dL Final  . AST 12/15/2017 18  15 - 41 U/L Final  . ALT 12/15/2017 19  0 - 44 U/L Final  . Alkaline Phosphatase 12/15/2017 150* 38 - 126 U/L Final  . Total Bilirubin 12/15/2017 0.5  0.3 - 1.2 mg/dL Final  . GFR calc non Af Amer 12/15/2017 >60  >60 mL/min Final  . GFR calc Af Amer 12/15/2017 >60  >60 mL/min Final   Comment: (NOTE) The eGFR has been calculated using the CKD EPI equation. This calculation has not been validated in all clinical situations. eGFR's  persistently <60 mL/min signify possible Chronic Kidney Disease.   Georgiann Hahn gap 12/15/2017 8  5 - 15 Final   Performed at [facility], [place] [address], [place], [place] [address]  . WBC 12/15/2017 4.7  4.0 - 10.5 K/uL Final  . RBC 12/15/2017 3.81* 3.87 - 5.11 MIL/uL Final  . Hemoglobin 12/15/2017 12.2  12.0 - 15.0 g/dL Final  . HCT 12/15/2017 37.6  36.0 - 46.0 % Final  . MCV 12/15/2017 98.7  80.0 - 100.0 fL Final  . MCH 12/15/2017 32.0  26.0 - 34.0 pg Final  . MCHC 12/15/2017 32.4  30.0 - 36.0 g/dL Final  . RDW 12/15/2017 12.6  11.5 - 15.5 % Final  . Platelets 12/15/2017 162  150 - 400 K/uL Final  . nRBC 12/15/2017 0.0  0.0 - 0.2 % Final  . Neutrophils Relative % 12/15/2017 51  % Final  . Neutro Abs 12/15/2017 2.4  1.7 - 7.7 K/uL Final  . Lymphocytes Relative 12/15/2017 36  % Final  . Lymphs Abs 12/15/2017 1.7  0.7 - 4.0 K/uL Final  . Monocytes Relative 12/15/2017 7  % Final  . Monocytes Absolute 12/15/2017 0.3  0.1 - 1.0 K/uL Final  . Eosinophils Relative 12/15/2017 5  % Final  . Eosinophils Absolute 12/15/2017 0.2  0.0 - 0.5 K/uL Final  . Basophils Relative 12/15/2017 1  % Final  . Basophils Absolute 12/15/2017 0.0  0.0 - 0.1 K/uL Final  . Immature Granulocytes 12/15/2017 0  % Final  . Abs Immature Granulocytes 12/15/2017 0.01  0.00 - 0.07 K/uL Final   Performed at [facility], [address] [place], [place] [address]    (this displays the last labs from the last 3 days)  No results found for: TOTALPROTELP, ALBUMINELP, A1GS, A2GS, BETS, BETA2SER, GAMS, MSPIKE, SPEI (this displays SPEP labs)  No results found for: KPAFRELGTCHN, LAMBDASER, KAPLAMBRATIO (kappa/lambda light chains)  No results found for: HGBA, HGBA2QUANT, HGBFQUANT, HGBSQUAN (Hemoglobinopathy evaluation)   No results found for: LDH  Lab Results  Component Value Date   IRON 61 12/25/2012   TIBC 331 12/25/2012   IRONPCTSAT 18 (L) 12/25/2012   (Iron and  TIBC)  Lab Results  Component Value Date   FERRITIN 31 12/25/2012    Urinalysis    Component Value Date/Time   COLORURINE LT YELLOW 04/18/2008 1220   APPEARANCEUR Clear 04/18/2008 1220   LABSPEC > OR = 1.030 04/18/2008 1220   PHURINE 5.5 04/18/2008 1220   GLUCOSEU NEGATIVE 04/18/2008 1220   BILIRUBINUR NEGATIVE 04/18/2008 1220   KETONESUR NEGATIVE 04/18/2008 1220   UROBILINOGEN 0.2 mg/dL 04/18/2008 1220   NITRITE Negative 04/18/2008 1220   LEUKOCYTESUR Negative 04/18/2008 1220     STUDIES: Mammographic report discussed with the patient  ELIGIBLE FOR AVAILABLE RESEARCH PROTOCOL: no  ASSESSMENT: 62 y.o. [place] woman status post right breast lower outer quadrant biopsy 11/11/2016 for a clinical TX N), stage I invasive ductal carcinoma, grade 2, E-cadherin positive, estrogen receptor 95% positive, progesterone receptor 5% positive, with an MIB-1 of 10%, and no HER-2 amplification  (1) Status post right lumpectomy and right axillary sentinel lymph node sampling 12/16/2016 for a pT1c pN0, stage IA invasive ductal carcinoma, grade 1, with negative margins.  Total of 3 sentinel lymph nodes removed  (2) The Oncotype DX score was 18, predicting a risk of outside the breast recurrence over the next 10 years of 11% if the patient's only systemic therapy is tamoxifen for 5 years.  It also predicts no benefit from chemotherapy.  (3) Adjuvant radiation 02/05/2017-03/05/2017 Site/dose:    1. Right breast, 2.67 Gy in 15 fractions for a total dose of 40.05 Gy                    2. Right breast boost, 2 Gy in 5 fractions for a total dose of 10 Gy   (4) anastrozole started February 2019  (a) not a good tamoxifen candidate given problem #5  (b) bone density at breast center 10/04/2015 with T-score of -0.3 (normal)  (5) history of DVT/PE March 2010, with negative extensive hypercoagulable workup, IVC filter in place  PLAN: [name] is now a year out from definitive surgery for her breast cancer  with no evidence of disease recurrence.  This is favorable.  She is tolerating anastrozole well and the plan will be to continue that a total of 5 years.  She will need a repeat bone density at some point next year.  She is currently very stressed because of her grandmother situation.  We discussed that at length today.  Her grandmother lives in [place] which makes it even plicated  Breast cancer wise though Kamani is doing well.  She knows to call for any other issues that may develop before the next visit.  Aleksandr Pellow, Virgie Dad, MD  12/15/17 3:56 PM Medical Oncology and Hematology [facility] [address] [place], [place] [address] Tel. [phone]    Fax. [phone]    Elie Goody, am acting as scribe for Dr. Virgie Dad. Kaspian Muccio.  I, Lurline Del MD, have reviewed the above documentation for accuracy and completeness, and I agree with the above.  AddendumLeitha Schuller with her chest x-ray which was good and also with her alkaline phosphatase which was not.  I set her up for repeat labs in a month.

## 2017-12-15 NOTE — Telephone Encounter (Signed)
Per 12/15/17 los.  Called patient and left message with Nov 2020 appt.

## 2017-12-16 ENCOUNTER — Telehealth: Payer: Self-pay | Admitting: Oncology

## 2017-12-16 NOTE — Telephone Encounter (Signed)
Scheduled appt per 11/4 sch message - pt is aware and reminder letter sent in the mail .

## 2018-01-15 ENCOUNTER — Inpatient Hospital Stay: Payer: Federal, State, Local not specified - PPO | Attending: Oncology

## 2018-01-15 DIAGNOSIS — C50511 Malignant neoplasm of lower-outer quadrant of right female breast: Secondary | ICD-10-CM | POA: Diagnosis present

## 2018-01-15 DIAGNOSIS — D508 Other iron deficiency anemias: Secondary | ICD-10-CM

## 2018-01-15 DIAGNOSIS — Z170 Estrogen receptor positive status [ER+]: Secondary | ICD-10-CM

## 2018-01-15 DIAGNOSIS — I2699 Other pulmonary embolism without acute cor pulmonale: Secondary | ICD-10-CM

## 2018-01-15 LAB — CBC WITH DIFFERENTIAL/PLATELET
Abs Immature Granulocytes: 0.07 10*3/uL (ref 0.00–0.07)
BASOS ABS: 0 10*3/uL (ref 0.0–0.1)
Basophils Relative: 0 %
EOS ABS: 0 10*3/uL (ref 0.0–0.5)
EOS PCT: 0 %
HEMATOCRIT: 39 % (ref 36.0–46.0)
Hemoglobin: 12.6 g/dL (ref 12.0–15.0)
IMMATURE GRANULOCYTES: 1 %
Lymphocytes Relative: 21 %
Lymphs Abs: 2.6 10*3/uL (ref 0.7–4.0)
MCH: 31.8 pg (ref 26.0–34.0)
MCHC: 32.3 g/dL (ref 30.0–36.0)
MCV: 98.5 fL (ref 80.0–100.0)
Monocytes Absolute: 0.7 10*3/uL (ref 0.1–1.0)
Monocytes Relative: 6 %
NEUTROS PCT: 72 %
NRBC: 0 % (ref 0.0–0.2)
Neutro Abs: 8.5 10*3/uL — ABNORMAL HIGH (ref 1.7–7.7)
PLATELETS: 187 10*3/uL (ref 150–400)
RBC: 3.96 MIL/uL (ref 3.87–5.11)
RDW: 12.5 % (ref 11.5–15.5)
WBC: 11.9 10*3/uL — AB (ref 4.0–10.5)

## 2018-01-15 LAB — COMPREHENSIVE METABOLIC PANEL
ALT: 15 U/L (ref 0–44)
AST: 12 U/L — AB (ref 15–41)
Albumin: 3.9 g/dL (ref 3.5–5.0)
Alkaline Phosphatase: 174 U/L — ABNORMAL HIGH (ref 38–126)
Anion gap: 7 (ref 5–15)
BILIRUBIN TOTAL: 0.4 mg/dL (ref 0.3–1.2)
BUN: 15 mg/dL (ref 6–20)
CO2: 28 mmol/L (ref 22–32)
CREATININE: 1.14 mg/dL — AB (ref 0.44–1.00)
Calcium: 9.8 mg/dL (ref 8.9–10.3)
Chloride: 107 mmol/L (ref 98–111)
GFR calc Af Amer: >60 mL/min (ref >60)
GFR, EST NON AFRICAN AMERICAN: 52 mL/min — AB (ref >60)
GLUCOSE: 130 mg/dL — AB (ref 70–99)
Potassium: 4.3 mmol/L (ref 3.5–5.1)
Sodium: 142 mmol/L (ref 135–145)
Total Protein: 8.4 g/dL — ABNORMAL HIGH (ref 6.5–8.1)

## 2018-01-16 ENCOUNTER — Other Ambulatory Visit: Payer: Self-pay | Admitting: *Deleted

## 2018-01-16 ENCOUNTER — Telehealth: Payer: Self-pay | Admitting: *Deleted

## 2018-01-16 DIAGNOSIS — Z170 Estrogen receptor positive status [ER+]: Principal | ICD-10-CM

## 2018-01-16 DIAGNOSIS — C50511 Malignant neoplasm of lower-outer quadrant of right female breast: Secondary | ICD-10-CM

## 2018-01-16 DIAGNOSIS — M898X9 Other specified disorders of bone, unspecified site: Secondary | ICD-10-CM

## 2018-01-16 NOTE — Telephone Encounter (Signed)
This RN spoke with pt per issues of concern.  1. Per lab draw yesterday - pt stated concern due to history of blood clot x 2( 2010)and inquiry by primary MD " if I should be on lifelong blood thinner?"  2. She states pain in lower right extremity.  3. Continued elevation of alk phosphatase.  Informed pt MD reviewed above with recommendation to obtain a bone scan for best evaluation. Follow up visit will be scheduled for review of scan and to discuss her concern with history of blood clots.  [name] verbalized understanding of plan and communication of concerns and plan.  Order entered for bone scan and follow up visit.

## 2018-01-19 ENCOUNTER — Telehealth: Payer: Self-pay | Admitting: Oncology

## 2018-01-19 NOTE — Telephone Encounter (Signed)
Scheduled appt per 12/9 sch message pt is aware of appt date and time   

## 2018-01-29 ENCOUNTER — Telehealth: Payer: Self-pay | Admitting: *Deleted

## 2018-01-29 NOTE — Telephone Encounter (Signed)
Pt called to this RN to inquire about bone scan scheduled for tomorrow- she states she was contacted by pre auth for [facility] and per discussion- scan would be covered better if done post Jan 1. Jacaria is asking if that is ok with MD.  This RN discussed above- with plan for pt to call to reschedule scan - MD appointment will be rescheduled accordingly.

## 2018-01-30 ENCOUNTER — Ambulatory Visit (HOSPITAL_COMMUNITY): Payer: Federal, State, Local not specified - PPO

## 2018-01-30 ENCOUNTER — Ambulatory Visit (HOSPITAL_COMMUNITY): Admission: RE | Admit: 2018-01-30 | Payer: Federal, State, Local not specified - PPO | Source: Ambulatory Visit

## 2018-02-03 ENCOUNTER — Telehealth: Payer: Self-pay | Admitting: Oncology

## 2018-02-03 ENCOUNTER — Telehealth: Payer: Self-pay | Admitting: *Deleted

## 2018-02-03 ENCOUNTER — Inpatient Hospital Stay: Payer: Federal, State, Local not specified - PPO | Admitting: Oncology

## 2018-02-03 NOTE — Telephone Encounter (Signed)
Scheduled appt per 12/24 sch message. - pt is aware of appt date and time

## 2018-02-03 NOTE — Telephone Encounter (Signed)
This RN received VM from pt stating the appointment scheduled for today needs to be rescheduled to after her bone scan.  The bone scan has not been rescheduled at this time.  Appointment canceled and rescheduling request sent.

## 2018-02-05 ENCOUNTER — Telehealth: Payer: Self-pay | Admitting: Cardiology

## 2018-02-05 DIAGNOSIS — M79605 Pain in left leg: Secondary | ICD-10-CM

## 2018-02-05 DIAGNOSIS — R238 Other skin changes: Secondary | ICD-10-CM

## 2018-02-05 DIAGNOSIS — Z86718 Personal history of other venous thrombosis and embolism: Secondary | ICD-10-CM

## 2018-02-05 DIAGNOSIS — M7989 Other specified soft tissue disorders: Secondary | ICD-10-CM

## 2018-02-05 DIAGNOSIS — R600 Localized edema: Secondary | ICD-10-CM

## 2018-02-05 DIAGNOSIS — M79604 Pain in right leg: Secondary | ICD-10-CM

## 2018-02-05 NOTE — Telephone Encounter (Signed)
Spoke to patient, patient states she is having BL LE calf swelling and pain R>L.   States the right calf is red and warm to touch.   She states she is sedentary at her job but no recent long car rides or flights.  She has hx of DVT and PE.   She last had LE [place] in August which was - for any new clot, xarelto was stopped and patient was to take ASA only.    Patient states she does not take the aspirin daily like she should.  She is requesting BL LE dopplers to r/o DVT.    Reviewed with DOD, Dr. Oval Linsey.  Ok to order.    Order placed, patient made aware scheduler will call to arrange.   Verbalized understanding.

## 2018-02-05 NOTE — Telephone Encounter (Signed)
New Message:   Patient would like to get a Doppler done as soon as possible. Patient states that her leg is warm and swollen. It hurst and she states that she has had Blood clot before.

## 2018-02-06 ENCOUNTER — Ambulatory Visit (HOSPITAL_COMMUNITY)
Admission: RE | Admit: 2018-02-06 | Discharge: 2018-02-06 | Disposition: A | Discharge status: Home / Self Care | Payer: Federal, State, Local not specified - PPO | Source: Ambulatory Visit | Attending: Cardiovascular Disease | Admitting: Cardiovascular Disease

## 2018-02-06 DIAGNOSIS — Z86718 Personal history of other venous thrombosis and embolism: Secondary | ICD-10-CM | POA: Diagnosis not present

## 2018-02-06 DIAGNOSIS — M7989 Other specified soft tissue disorders: Secondary | ICD-10-CM | POA: Diagnosis not present

## 2018-02-06 DIAGNOSIS — R600 Localized edema: Secondary | ICD-10-CM

## 2018-02-06 DIAGNOSIS — M79605 Pain in left leg: Secondary | ICD-10-CM | POA: Diagnosis present

## 2018-02-06 DIAGNOSIS — M79604 Pain in right leg: Secondary | ICD-10-CM

## 2018-02-06 DIAGNOSIS — R238 Other skin changes: Secondary | ICD-10-CM | POA: Insufficient documentation

## 2018-02-16 ENCOUNTER — Encounter (INDEPENDENT_AMBULATORY_CARE_PROVIDER_SITE_OTHER): Payer: Self-pay

## 2018-02-16 ENCOUNTER — Encounter: Payer: Self-pay | Admitting: Cardiology

## 2018-02-16 ENCOUNTER — Ambulatory Visit: Payer: Federal, State, Local not specified - PPO | Admitting: Cardiology

## 2018-02-16 DIAGNOSIS — E0500 Thyrotoxicosis with diffuse goiter without thyrotoxic crisis or storm: Secondary | ICD-10-CM

## 2018-02-16 DIAGNOSIS — Z853 Personal history of malignant neoplasm of breast: Secondary | ICD-10-CM

## 2018-02-16 DIAGNOSIS — Z86718 Personal history of other venous thrombosis and embolism: Secondary | ICD-10-CM

## 2018-02-16 NOTE — Assessment & Plan Note (Signed)
H/O DVT and PE-2010- negative hematologic work up. Treated with anticoagulation and IVF filter. Recurrent DVT Jan 2019- Xarelto x 6 months with negative doppler Aug 2019- Xarelto stopped. Leg pain with negative doppler 02/06/18

## 2018-02-16 NOTE — Progress Notes (Signed)
02/16/2018 [name]   Oct 25, 1955  [id number]  Primary Physician Long, Nicki Reaper, PA-C Primary Cardiologist: Dr Percival Spanish  HPI:  This is an office note on [name].  Patient is a pleasant 63 year old female who is been followed by Dr. Percival Spanish.  She has a history of a DVT and pulmonary embolism in 2010.  She had a negative hypercoagulable work-up.  An IVC filter was placed.  She then had breast cancer with lumpectomy and radiation.  She apparently had recurrent DVT and Xarelto was added.  When Dr. Percival Spanish saw her in May 2019 she was still on Xarelto.  She had venous Dopplers in August 2018 that did not show new clot.  She had been treated for 6 months with Xarelto and it was discontinued at this time.  She saw Dr. Doren Custard in October for follow-up of her IVC filter.  There was a question about removing the filter.  A CT scan in 2017 showed that the filter was in good position although there were 2 struts that appeared to penetrate the inferior vena cava.  They did not appear to be causing any significant problems.  Given that the filter been placed many years ago Dr. Doren Custard felt removing the filter would be associated with a significant risk.  The patient is in the office today because she has had some intermittent right leg swelling and pain.  There is no significant swelling on exam in the office today.  In retrospect she thinks the pain may be more musculoskeletal.  She did have repeat Dopplers done since she was off Xarelto, this was on February 06, 2018.  This did not reveal a DVT and there was no change in therapy recommended.   Current Outpatient Medications  Medication Sig Dispense Refill  . albuterol (PROVENTIL HFA;VENTOLIN HFA) 108 (90 Base) MCG/ACT inhaler     . anastrozole (ARIMIDEX) 1 MG tablet Take 1 tablet (1 mg total) by mouth daily. 90 tablet 4  . Ascorbic Acid (VITAMIN C) 1000 MG tablet Take 1,000 mg by mouth daily.    Marland Kitchen aspirin EC 81 MG tablet Take 1 tablet (81 mg total) by  mouth daily. 90 tablet 3  . atorvastatin (LIPITOR) 20 MG tablet Take 20 mg by mouth daily at 6 PM.     . Cholecalciferol (VITAMIN D-3 PO) Take 1 capsule by mouth daily.    Marland Kitchen LORazepam (ATIVAN) 0.5 MG tablet Take 0.5 mg by mouth 2 (two) times daily as needed for anxiety.   0  . methimazole (TAPAZOLE) 5 MG tablet Take 10 mg by mouth daily.    . Omega-3 Fatty Acids (FISH OIL) 1200 MG CAPS Take 1 capsule by mouth daily.    Vladimir Faster Glycol-Propyl Glycol (SYSTANE OP) Place 1 drop into both eyes daily as needed (dry eyes).      No current facility-administered medications for this visit.     Allergies  Allergen Reactions  . Amoxicillin-Pot Clavulanate Other (See Comments)    Dizziness   . Niacin-Lovastatin Er Other (See Comments)    Caused flushing     Past Medical History:  Diagnosis Date  . Anxiety   . Arthritis    neck  . Breast cancer ([place]) 2018   Right Breast Cancer  . Cancer [facility])    right breast cancer  . DVT (deep venous thrombosis) ([place])    04/2010  . GERD (gastroesophageal reflux disease)   . Headache(784.0) 09/22/2012  . Heart murmur    dx in the  90s - said that it comes and goes - cardiologist said it was a mild murmur  . History of hiatal hernia   . History of radiation therapy 02/05/17-03/05/17   right breast 40.05 Gy in 15 fractions, right breast boost 10 Gy in 5 fractions  . Hyperlipemia   . Hyperlipidemia   . Hypertension   . Hyperthyroidism   . Iron deficiency anemia   . Palpitations   . Personal history of radiation therapy   . Pulmonary embolism ([place])   . Uterine fibroid   . Vitamin D deficiency    Graves disease    Social History   Socioeconomic History  . Marital status: Single    Spouse name: Not on file  . Number of children: Not on file  . Years of education: college  . Highest education level: Not on file  Occupational History  . 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 Financial resource strain: Not on  file  . Food insecurity:    Worry: Not on file    Inability: Not on file  . Transportation needs:    Medical: Not on file    Non-medical: Not on file  Tobacco Use  . Smoking status: Former Smoker    Packs/day: 0.40    Years: 10.00    Pack years: 4.00    Types: Cigarettes    Last attempt to quit: 06/01/2002    Years since quitting: 15.7  . Smokeless tobacco: Never Used  Substance and Sexual Activity  . Alcohol use: Yes    Comment: occasional 1 drink per month  . Drug use: No  . Sexual activity: Not on file  Lifestyle  . Physical activity:    Days per week: Not on file    Minutes per session: Not on file  . Stress: Not on file  Relationships  . Social connections:    Talks on phone: Not on file    Gets together: Not on file    Attends religious service: Not on file    Active member of club or organization: Not on file    Attends meetings of clubs or organizations: Not on file    Relationship status: Not on file  . Intimate partner violence:    Fear of current or ex partner: Not on file    Emotionally abused: Not on file    Physically abused: Not on file    Forced sexual activity: Not on file  Other Topics Concern  . Not on file  Social History Narrative   Lives alone.      Family History  Problem Relation Age of Onset  . Drug abuse Brother   . Hypertension Mother   . Colon cancer Maternal Grandfather      Review of Systems: General: negative for chills, fever, night sweats or weight changes.  Cardiovascular: negative for chest pain, dyspnea on exertion, edema, orthopnea, palpitations, paroxysmal nocturnal dyspnea or shortness of breath Dermatological: negative for rash Respiratory: negative for cough or wheezing Urologic: negative for hematuria Abdominal: negative for nausea, vomiting, diarrhea, bright red blood per rectum, melena, or hematemesis Neurologic: negative for visual changes, syncope, or dizziness All other systems reviewed and are otherwise  negative except as noted above.    Blood pressure 136/84, pulse 65, height 5' 6.5" (1.689 m), weight 196 lb (88.9 kg), last menstrual period 06/18/2010.  General appearance: alert, cooperative and no distress Lungs: clear to auscultation bilaterally Heart: regular rate and rhythm Extremities:  no edema Skin: warm and dry Neurologic: Grossly normal   ASSESSMENT AND PLAN:   History of DVT (deep vein thrombosis) H/O DVT and PE-2010- negative hematologic work up. Treated with anticoagulation and IVF filter. Recurrent DVT Jan 2019- Xarelto x 6 months with negative doppler Aug 2019- Xarelto stopped. Leg pain with negative doppler 02/06/18  History of breast cancer S/P lumpectomy, and radiation Nov 2018  Graves disease She is on tapazole her endocrinologist has retired- I suggested she check with her PCP and check TSH   PLAN  F/U Dr Percival Spanish in one year  Kerin Ransom PA-C 02/16/2018 4:46 PM

## 2018-02-16 NOTE — Assessment & Plan Note (Signed)
She is on tapazole her endocrinologist has retired- I suggested she check with her PCP and check TSH

## 2018-02-16 NOTE — Patient Instructions (Signed)
Medication Instructions:  Your physician recommends that you continue on your current medications as directed. Please refer to the Current Medication list given to you today. If you need a refill on your cardiac medications before your next appointment, please call your pharmacy.   Lab work: None  If you have labs (blood work) drawn today and your tests are completely normal, you will receive your results only by: Marland Kitchen MyChart Message (if you have MyChart) OR . A paper copy in the mail If you have any lab test that is abnormal or we need to change your treatment, we will call you to review the results.  Testing/Procedures: None   Follow-Up: At [facility], you and your health needs are our priority.  As part of our continuing mission to provide you with exceptional heart care, we have created designated Provider Care Teams.  These Care Teams include your primary Cardiologist (physician) and Advanced Practice Providers (APPs -  Physician Assistants and Nurse Practitioners) who all work together to provide you with the care you need, when you need it. You will need a follow up appointment in 12 months.  Please call our office 2 months(November 2020) in advance to schedule this appointment.  You may see Dr Minus Breeding or one of the following Advanced Practice Providers on your designated Care Team:   Rosaria Ferries, PA-C . Jory Sims, DNP, ANP  Any Other Special Instructions Will Be Listed Below (If Applicable).

## 2018-02-16 NOTE — Assessment & Plan Note (Signed)
S/P lumpectomy, and radiation Nov 2018

## 2018-02-20 DIAGNOSIS — K080 Exfoliation of teeth due to systemic causes: Secondary | ICD-10-CM | POA: Diagnosis not present

## 2018-02-24 ENCOUNTER — Encounter (HOSPITAL_COMMUNITY)
Admission: RE | Admit: 2018-02-24 | Discharge: 2018-02-24 | Disposition: A | Discharge status: Home / Self Care | Payer: Federal, State, Local not specified - PPO | Source: Ambulatory Visit | Attending: Oncology | Admitting: Oncology

## 2018-02-24 DIAGNOSIS — M898X9 Other specified disorders of bone, unspecified site: Secondary | ICD-10-CM | POA: Insufficient documentation

## 2018-02-24 DIAGNOSIS — C50919 Malignant neoplasm of unspecified site of unspecified female breast: Secondary | ICD-10-CM | POA: Diagnosis not present

## 2018-02-24 DIAGNOSIS — C50511 Malignant neoplasm of lower-outer quadrant of right female breast: Secondary | ICD-10-CM | POA: Insufficient documentation

## 2018-02-24 DIAGNOSIS — Z170 Estrogen receptor positive status [ER+]: Secondary | ICD-10-CM | POA: Insufficient documentation

## 2018-02-24 MED ORDER — TECHNETIUM TC 99M MEDRONATE IV KIT
18.1000 | PACK | Freq: Once | INTRAVENOUS | Status: AC | PRN
  Administered 2018-02-24: 18.1 via INTRAVENOUS

## 2018-03-03 NOTE — Progress Notes (Signed)
[name]  [phone] Fax:([phone]) [phone]     ID: [name] DOB: October 27, 1955  MR#: [id number]  EHU#:[id number]  Patient Care Team: Shanon Rosser, PA-C as PCP - General (Physician Assistant) Rolm Bookbinder, MD as Consulting Physician (General Surgery) Magrinat, Virgie Dad, MD as Consulting Physician (Oncology) Gery Pray, MD as Consulting Physician (Radiation Oncology) OTHER MD:   CHIEF COMPLAINT: Estrogen receptor positive breast cancer  CURRENT TREATMENT: anastrozole  HISTORY OF CURRENT ILLNESS: From the original intake note:  [name] had routine bilateral screening mammography with tomography at the [facility] 10/08/2016. An area of possible distortion in the right breast was noted. She was recalled for right diagnostic mammography with tomography on 11/08/2016. This found the breast density to be category C. The small area of architectural distortion in the lateral right breast persisted and on 11/11/2016 biopsy of this area showed ([id number]) invasive ductal carcinoma, E-cadherin positive, estrogen receptor 95% positive with strong staining intensity, progesterone receptor 5% positive, with strong staining intensity, with an MIB-1 of 10%, and no HER-2 amplification, with a signals ratio of 1.51 and number per cell 3.40. Right axillary ultrasound 11/13/2016 was sonographically benign.  Of note, she has a history of DVT diagnosed March 2010, felt to be possibly related to estrogen replacement therapy. She was coumadinized but developed a pulmonary embolus while on Coumadin. She was then had an IVC filter placed and was anticoagulated with Lovenox for 2 years. An extensive hypercoagulable workup was negative except for an elevated factor VIII level. Her IVC filter is still in place and the patient tells me she is participating in a lawsuit regarding that.   The patient's subsequent history is as detailed below.   INTERVAL HISTORY: [name]  returns today for follow-up and treatment of her estrogen receptor positive breast cancer.   She continues on anastrozole. She rarely has hot flashes and she does not have any vaginal dryness.   [name]'s last bone density screening on 07/21/2015 at the [facility], showed a T-score of -0.3, which is considered normal.    Her last mammography was on 11/14/2017 at [facility] showing Breast Density Category C. There are expected surgical changes in the lateral right breast consistent with lumpectomy. No mammographic evidence of malignancy in the bilateral breasts.  Since her last visit here, she underwent a whole body bone scan on 02/24/2018 showing uptake at the shoulders, hips, and LEFT wrist, typically degenerative. No worrisome sites of abnormal osseous tracer accumulation are identified to suggest osseous metastatic disease. Expected urinary tract and soft tissue distribution of tracer.   REVIEW OF SYSTEMS: For the holidays, [name] didn't do much. She has felt fatigued and she has a "foggy brain." She has been taking care of her mother. For exercise, she dances on Monday nights. She tries to go to [employer], but she says it doesn't hold her attention. She has been interested in the [place] program at the [facility]. She says that she has been overwhelmed at work recently. The patient denies unusual headaches, visual changes, nausea, vomiting, or dizziness. There has been no unusual cough, phlegm production, or pleurisy. This been no change in bowel or bladder habits. The patient denies unexplained fatigue or unexplained weight loss, bleeding, rash, or fever. A detailed review of systems was otherwise noncontributory.    PAST MEDICAL HISTORY: Past Medical History:  Diagnosis Date  . Anxiety   . Arthritis    neck  . Breast cancer ([place]) 2018   Right Breast  Cancer  . Cancer [facility])    right breast cancer  . DVT (deep venous thrombosis) ([place])    04/2010  . GERD (gastroesophageal reflux  disease)   . Headache(784.0) 09/22/2012  . Heart murmur    dx in the 90s - said that it comes and goes - cardiologist said it was a mild murmur  . History of hiatal hernia   . History of radiation therapy 02/05/17-03/05/17   right breast 40.05 Gy in 15 fractions, right breast boost 10 Gy in 5 fractions  . Hyperlipemia   . Hyperlipidemia   . Hypertension   . Hyperthyroidism   . Iron deficiency anemia   . Palpitations   . Personal history of radiation therapy   . Pulmonary embolism ([place])   . Uterine fibroid   . Vitamin D deficiency    Graves disease    PAST SURGICAL HISTORY: Past Surgical History:  Procedure Laterality Date  . BREAST LUMPECTOMY Right 12/16/2016  . BREAST LUMPECTOMY WITH RADIOACTIVE SEED AND SENTINEL LYMPH NODE BIOPSY Right 12/16/2016   Procedure: RIGHT BREAST LUMPECTOMY WITH RADIOACTIVE SEED AND SENTINEL LYMPH NODE BIOPSY;  Surgeon: Rolm Bookbinder, MD;  Location: [facility];  Service: General;  Laterality: Right;  . CHOLECYSTECTOMY    . COLONOSCOPY    . DILATION AND CURETTAGE OF UTERUS     01/2008  . ESOPHAGOGASTRODUODENOSCOPY    . ivc filter      FAMILY HISTORY Family History  Problem Relation Age of Onset  . Drug abuse Brother   . Hypertension Mother   . Colon cancer Maternal Grandfather    She notes that her father died at age 26 from an accidental head injury.The patient's mother is 51 years old as of October 2018. Pt has one brother and no sisters. Pt reports that a second cousin has a hx of breast cancer and was dx in her 58's. Pt denies family hx of ovarian cancer.   GYNECOLOGIC HISTORY:  Patient's last menstrual period was 06/18/2010. Menarche: 63 years old Age at first live birth: No children GP: GXP0 LMP: March 2016 Contraceptive: OCP on and off for many years d/c after DVT and PE diagnosis.  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 She denies having pets at home.     ADVANCED  DIRECTIVES: Not in place. At the 11/20/2016 visit the patient was given the appropriate documents to complete and notarize at her discretion   HEALTH MAINTENANCE: Social History   Tobacco Use  . Smoking status: Former Smoker    Packs/day: 0.40    Years: 10.00    Pack years: 4.00    Types: Cigarettes    Last attempt to quit: 06/01/2002    Years since quitting: 15.7  . Smokeless tobacco: Never Used  Substance Use Topics  . Alcohol use: Yes    Comment: occasional 1 drink per month  . Drug use: No     Colonoscopy: 2013  PAP: 08/30/2016   Bone density: 10/04/2015 with T-score of -0.3 at femur neck right   Allergies  Allergen Reactions  . Amoxicillin-Pot Clavulanate Other (See Comments)    Dizziness   . Niacin-Lovastatin Er Other (See Comments)    Caused flushing     Current Outpatient Medications  Medication Sig Dispense Refill  . albuterol (PROVENTIL HFA;VENTOLIN HFA) 108 (90 Base) MCG/ACT inhaler     . anastrozole (ARIMIDEX) 1 MG tablet Take 1 tablet (1 mg total) by mouth  daily. 90 tablet 4  . Ascorbic Acid (VITAMIN C) 1000 MG tablet Take 1,000 mg by mouth daily.    Marland Kitchen aspirin EC 81 MG tablet Take 1 tablet (81 mg total) by mouth daily. 90 tablet 3  . atorvastatin (LIPITOR) 20 MG tablet Take 20 mg by mouth daily at 6 PM.     . Cholecalciferol (VITAMIN D-3 PO) Take 1 capsule by mouth daily.    Marland Kitchen LORazepam (ATIVAN) 0.5 MG tablet Take 0.5 mg by mouth 2 (two) times daily as needed for anxiety.   0  . methimazole (TAPAZOLE) 5 MG tablet Take 10 mg by mouth daily.    . Omega-3 Fatty Acids (FISH OIL) 1200 MG CAPS Take 1 capsule by mouth daily.    Vladimir Faster Glycol-Propyl Glycol (SYSTANE OP) Place 1 drop into both eyes daily as needed (dry eyes).      No current facility-administered medications for this visit.     OBJECTIVE: Middle-aged African-American woman in no acute distress  Vitals:   03/04/18 1428  BP: 135/79  Pulse: 70  Resp: 18  Temp: 98.6 F (37 C)  SpO2: 100%       Body mass index is 30.83 kg/m.   Wt Readings from Last 3 Encounters:  03/04/18 193 lb 14.4 oz (88 kg)  02/16/18 196 lb (88.9 kg)  12/15/17 193 lb 4.8 oz (87.7 kg)      ECOG FS:1 - Symptomatic but completely ambulatory  Sclerae unicteric, EOMs intact No cervical or supraclavicular adenopathy Lungs no rales or rhonchi Heart regular rate and rhythm Abd soft, nontender, positive bowel sounds MSK no focal spinal tenderness, no upper extremity lymphedema Neuro: nonfocal, well oriented, appropriate affect Breasts: The right breast has undergone lumpectomy and radiation.  There is no evidence of disease recurrence.  The left breast is benign.  Both axillae are benign.  LAB RESULTS:  CMP     Component Value Date/Time   NA 140 03/04/2018 1359   NA 141 11/20/2016 0822   K 4.1 03/04/2018 1359   K 3.6 11/20/2016 0822   CL 107 03/04/2018 1359   CL 105 06/24/2012 1548   CO2 24 03/04/2018 1359   CO2 24 11/20/2016 0822   GLUCOSE 139 (H) 03/04/2018 1359   GLUCOSE 102 11/20/2016 0822   GLUCOSE 91 06/24/2012 1548   BUN 13 03/04/2018 1359   BUN 19.2 11/20/2016 0822   CREATININE 0.89 03/04/2018 1359   CREATININE 0.9 11/20/2016 0822   CALCIUM 9.5 03/04/2018 1359   CALCIUM 9.5 11/20/2016 0822   PROT 7.8 03/04/2018 1359   PROT 7.9 03/21/2017 1141   PROT 8.2 11/20/2016 0822   ALBUMIN 3.8 03/04/2018 1359   ALBUMIN 4.3 03/21/2017 1141   ALBUMIN 3.8 11/20/2016 0822   AST 13 (L) 03/04/2018 1359   AST 15 11/20/2016 0822   ALT 15 03/04/2018 1359   ALT 15 11/20/2016 0822   ALKPHOS 145 (H) 03/04/2018 1359   ALKPHOS 122 11/20/2016 0822   BILITOT 0.3 03/04/2018 1359   BILITOT 0.5 03/21/2017 1141   BILITOT 0.52 11/20/2016 0822   GFRNONAA >60 03/04/2018 1359   GFRAA >60 03/04/2018 1359    No results found for: TOTALPROTELP, ALBUMINELP, A1GS, A2GS, BETS, BETA2SER, GAMS, MSPIKE, SPEI  No results found for: KPAFRELGTCHN, LAMBDASER, KAPLAMBRATIO  Lab Results  Component Value Date   WBC  5.3 03/04/2018   NEUTROABS 2.9 03/04/2018   HGB 12.3 03/04/2018   HCT 37.0 03/04/2018   MCV 98.7 03/04/2018   PLT 154 03/04/2018    @  LASTCHEMISTRY@  Lab Results  Component Value Date   LABCA2 17 04/26/2008    No components found for: [id number]  No results for input(s): INR in the last 168 hours.  Lab Results  Component Value Date   LABCA2 17 04/26/2008    No results found for: MOL078  No results found for: MLJ449  No results found for: EEF007  No results found for: CA2729  No components found for: HGQUANT  No results found for: CEA1 / No results found for: CEA1   No results found for: AFPTUMOR  No results found for: [place]  No results found for: PSA1  Appointment on 03/04/2018  Component Date Value Ref Range Status  . WBC 03/04/2018 5.3  4.0 - 10.5 K/uL Final  . RBC 03/04/2018 3.75* 3.87 - 5.11 MIL/uL Final  . Hemoglobin 03/04/2018 12.3  12.0 - 15.0 g/dL Final  . HCT 03/04/2018 37.0  36.0 - 46.0 % Final  . MCV 03/04/2018 98.7  80.0 - 100.0 fL Final  . MCH 03/04/2018 32.8  26.0 - 34.0 pg Final  . MCHC 03/04/2018 33.2  30.0 - 36.0 g/dL Final  . RDW 03/04/2018 12.6  11.5 - 15.5 % Final  . Platelets 03/04/2018 154  150 - 400 K/uL Final  . nRBC 03/04/2018 0.0  0.0 - 0.2 % Final  . Neutrophils Relative % 03/04/2018 54  % Final  . Neutro Abs 03/04/2018 2.9  1.7 - 7.7 K/uL Final  . Lymphocytes Relative 03/04/2018 35  % Final  . Lymphs Abs 03/04/2018 1.8  0.7 - 4.0 K/uL Final  . Monocytes Relative 03/04/2018 6  % Final  . Monocytes Absolute 03/04/2018 0.3  0.1 - 1.0 K/uL Final  . Eosinophils Relative 03/04/2018 4  % Final  . Eosinophils Absolute 03/04/2018 0.2  0.0 - 0.5 K/uL Final  . Basophils Relative 03/04/2018 1  % Final  . Basophils Absolute 03/04/2018 0.0  0.0 - 0.1 K/uL Final  . Immature Granulocytes 03/04/2018 0  % Final  . Abs Immature Granulocytes 03/04/2018 0.01  0.00 - 0.07 K/uL Final   Performed at [facility], [place]  [address] [place], [place] [address]  . Sodium 03/04/2018 140  135 - 145 mmol/L Final  . Potassium 03/04/2018 4.1  3.5 - 5.1 mmol/L Final  . Chloride 03/04/2018 107  98 - 111 mmol/L Final  . CO2 03/04/2018 24  22 - 32 mmol/L Final  . Glucose, Bld 03/04/2018 139* 70 - 99 mg/dL Final  . BUN 03/04/2018 13  6 - 20 mg/dL Final  . Creatinine, Ser 03/04/2018 0.89  0.44 - 1.00 mg/dL Final  . Calcium 03/04/2018 9.5  8.9 - 10.3 mg/dL Final  . Total Protein 03/04/2018 7.8  6.5 - 8.1 g/dL Final  . Albumin 03/04/2018 3.8  3.5 - 5.0 g/dL Final  . AST 03/04/2018 13* 15 - 41 U/L Final  . ALT 03/04/2018 15  0 - 44 U/L Final  . Alkaline Phosphatase 03/04/2018 145* 38 - 126 U/L Final  . Total Bilirubin 03/04/2018 0.3  0.3 - 1.2 mg/dL Final  . GFR calc non Af Amer 03/04/2018 >60  >60 mL/min Final  . GFR calc Af Amer 03/04/2018 >60  >60 mL/min Final  . Anion gap 03/04/2018 9  5 - 15 Final   Performed at [facility], [place] [address] [place], [place] [address]    (this displays the last labs from the last 3 days)  No results found for: TOTALPROTELP, ALBUMINELP,  A1GS, A2GS, BETS, BETA2SER, GAMS, MSPIKE, SPEI (this displays SPEP labs)  No results found for: KPAFRELGTCHN, LAMBDASER, KAPLAMBRATIO (kappa/lambda light chains)  No results found for: HGBA, HGBA2QUANT, HGBFQUANT, HGBSQUAN (Hemoglobinopathy evaluation)   No results found for: LDH  Lab Results  Component Value Date   IRON 61 12/25/2012   TIBC 331 12/25/2012   IRONPCTSAT 18 (L) 12/25/2012   (Iron and TIBC)  Lab Results  Component Value Date   FERRITIN 31 12/25/2012    Urinalysis    Component Value Date/Time   COLORURINE LT YELLOW 04/18/2008 1220   APPEARANCEUR Clear 04/18/2008 1220   LABSPEC > OR = 1.030 04/18/2008 1220   PHURINE 5.5 04/18/2008 1220   GLUCOSEU NEGATIVE 04/18/2008 1220   BILIRUBINUR NEGATIVE 04/18/2008 1220   KETONESUR NEGATIVE 04/18/2008 1220   UROBILINOGEN 0.2 mg/dL 04/18/2008 1220    NITRITE Negative 04/18/2008 1220   LEUKOCYTESUR Negative 04/18/2008 1220     STUDIES: Nm Bone Scan Whole Body  Result Date: 02/24/2018 CLINICAL DATA:  Invasive breast cancer, progression suspected, restaging, increased pain in RIGHT lower extremity, elevated alkaline phosphatase EXAM: NUCLEAR MEDICINE WHOLE BODY BONE SCAN TECHNIQUE: Whole body anterior and posterior images were obtained approximately 3 hours after intravenous injection of radiopharmaceutical. RADIOPHARMACEUTICALS:  18.1 mCi Technetium-63mMDP IV COMPARISON:  None Radiographic correlation: None recent FINDINGS: Uptake at the shoulders, hips, and LEFT wrist, typically degenerative. No worrisome sites of abnormal osseous tracer accumulation are identified to suggest osseous metastatic disease. Expected urinary tract and soft tissue distribution of tracer. IMPRESSION: No scintigraphic evidence of osseous metastatic disease. Electronically Signed   By: MLavonia DanaM.D.   On: 02/24/2018 18:11   Vas UKoreaLower Extremity Venous (dvt)  Result Date: 02/06/2018  Lower Venous Study Other Indications: Patient complains of pain and swelling in both legs for                    several weeks. She denies any shortness of breath. She has an                    IVC filter. Risk Factors: History of PE DVT History of rt pop DVT. Performing Technologist: GWilkie AyeRVT  Examination Guidelines: A complete evaluation includes B-mode imaging, spectral Doppler, color Doppler, and power Doppler as needed of all accessible portions of each vessel. Bilateral testing is considered an integral part of a complete examination. Limited examinations for reoccurring indications may be performed as noted.  Right Venous Findings: +---------+---------------+---------+-----------+----------+-------+          CompressibilityPhasicitySpontaneityPropertiesSummary +---------+---------------+---------+-----------+----------+-------+ CFV      Full           Yes      Yes                           +---------+---------------+---------+-----------+----------+-------+ SFJ      Full           Yes      Yes                          +---------+---------------+---------+-----------+----------+-------+ FV Prox  Full           Yes      Yes                          +---------+---------------+---------+-----------+----------+-------+ FV Mid   Full  Yes      Yes                          +---------+---------------+---------+-----------+----------+-------+ FV DistalFull           Yes      Yes                          +---------+---------------+---------+-----------+----------+-------+ PFV      Full                                                 +---------+---------------+---------+-----------+----------+-------+ POP      Full           Yes      Yes                          +---------+---------------+---------+-----------+----------+-------+ PTV      Full           Yes      Yes                          +---------+---------------+---------+-----------+----------+-------+ PERO     Full           Yes      Yes                          +---------+---------------+---------+-----------+----------+-------+ Gastroc  Full                                                 +---------+---------------+---------+-----------+----------+-------+ GSV      Full           Yes      Yes                          +---------+---------------+---------+-----------+----------+-------+  Left Venous Findings: +---------+---------------+---------+-----------+----------+-------+          CompressibilityPhasicitySpontaneityPropertiesSummary +---------+---------------+---------+-----------+----------+-------+ CFV      Full           Yes      Yes                          +---------+---------------+---------+-----------+----------+-------+ SFJ      Full           Yes      Yes                           +---------+---------------+---------+-----------+----------+-------+ FV Prox  Full           Yes      Yes                          +---------+---------------+---------+-----------+----------+-------+ FV Mid   Full           Yes      Yes                          +---------+---------------+---------+-----------+----------+-------+ FV DistalFull  Yes      Yes                          +---------+---------------+---------+-----------+----------+-------+ PFV      Full                                                 +---------+---------------+---------+-----------+----------+-------+ POP      Full           Yes      Yes                          +---------+---------------+---------+-----------+----------+-------+ PTV      Full           Yes      Yes                          +---------+---------------+---------+-----------+----------+-------+ PERO     Full           Yes      Yes                          +---------+---------------+---------+-----------+----------+-------+ Gastroc  Full                                                 +---------+---------------+---------+-----------+----------+-------+ GSV      Full           Yes      Yes                          +---------+---------------+---------+-----------+----------+-------+  Summary: Right: No indirect evidence of obstruction proximal to the inguinal ligament. Findings consistent with chronic deep vein thrombosis involving the right popliteal vein. Findings appear essentially unchanged compared to previous examination. No cystic structure found in the popliteal fossa. Left: No evidence of deep vein thrombosis in the lower extremity. No indirect evidence of obstruction proximal to the inguinal ligament. Findings appear essentially unchanged compared to previous examination. No cystic structure found in the popliteal fossa.  *See table(s) above for measurements and observations. Electronically signed  by Carlyle Dolly MD on 02/06/2018 at 4:27:14 PM.    Final     ELIGIBLE FOR AVAILABLE RESEARCH PROTOCOL: no   ASSESSMENT: 63 y.o. [name] woman status post right breast lower outer quadrant biopsy 11/11/2016 for a clinical TX N), stage I invasive ductal carcinoma, grade 2, E-cadherin positive, estrogen receptor 95% positive, progesterone receptor 5% positive, with an MIB-1 of 10%, and no HER-2 amplification  (1) Status post right lumpectomy and right axillary sentinel lymph node sampling 12/16/2016 for a pT1c pN0, stage IA invasive ductal carcinoma, grade 1, with negative margins.  Total of 3 sentinel lymph nodes removed  (2) The Oncotype DX score was 18, predicting a risk of outside the breast recurrence over the next 10 years of 11% if the patient's only systemic therapy is tamoxifen for 5 years.  It also predicts no benefit from chemotherapy.  (3) Adjuvant radiation 02/05/2017-03/05/2017 Site/dose:    1. Right breast, 2.67 Gy in 15 fractions for a total dose of 40.05  Gy                    2. Right breast boost, 2 Gy in 5 fractions for a total dose of 10 Gy   (4) anastrozole started February 2019  (a) not a good tamoxifen candidate given problem #5  (b) bone density at breast center 10/04/2015 with T-score of -0.3 (normal)  (5) history of DVT/PE March 2010, with negative extensive hypercoagulable workup, IVC filter in place   PLAN: [name] is now a little over a year out from definitive surgery for her breast cancer with no evidence of disease recurrence.  This is favorable.  She is tolerating anastrozole well and the plan will be to continue that for total of 5 years.  We discussed her bone scan which shows arthritis and no cancer.  We discussed the alkaline phosphatase which is a little bit better.  Most likely she has fatty liver.  She is prediabetic.  We discussed avoiding carbs and exercising 45 minutes 5 times a week.  If she does not I think her labs will look better when she  returns to see me next November  She will have her next mammogram in October and she will also have a DEXA scan at the same time  I think her mental fog is likely due to the stress from work and from her grandmother's situation.  As that resolves hopefully she will start feeling more clear headed.  She knows to call for any other issues that may develop before the next visit.   Magrinat, Virgie Dad, MD  03/04/18 3:40 PM Medical Oncology and Hematology [facility] [address] [place], [place] [address] Tel. [phone]    Fax. [phone]   I, Jacqualyn Posey am acting as a Education administrator for Chauncey Cruel, MD.   I, Lurline Del MD, have reviewed the above documentation for accuracy and completeness, and I agree with the above.

## 2018-03-04 ENCOUNTER — Inpatient Hospital Stay: Payer: Federal, State, Local not specified - PPO

## 2018-03-04 ENCOUNTER — Inpatient Hospital Stay: Payer: Federal, State, Local not specified - PPO | Attending: Oncology | Admitting: Oncology

## 2018-03-04 VITALS — BP 135/79 | HR 70 | Temp 98.6°F | Resp 18 | Ht 66.5 in | Wt 193.9 lb

## 2018-03-04 DIAGNOSIS — R7303 Prediabetes: Secondary | ICD-10-CM

## 2018-03-04 DIAGNOSIS — Z86711 Personal history of pulmonary embolism: Secondary | ICD-10-CM | POA: Diagnosis not present

## 2018-03-04 DIAGNOSIS — Z170 Estrogen receptor positive status [ER+]: Principal | ICD-10-CM

## 2018-03-04 DIAGNOSIS — C50511 Malignant neoplasm of lower-outer quadrant of right female breast: Secondary | ICD-10-CM

## 2018-03-04 DIAGNOSIS — D508 Other iron deficiency anemias: Secondary | ICD-10-CM

## 2018-03-04 DIAGNOSIS — K760 Fatty (change of) liver, not elsewhere classified: Secondary | ICD-10-CM | POA: Diagnosis not present

## 2018-03-04 DIAGNOSIS — Z87891 Personal history of nicotine dependence: Secondary | ICD-10-CM

## 2018-03-04 DIAGNOSIS — I2699 Other pulmonary embolism without acute cor pulmonale: Secondary | ICD-10-CM

## 2018-03-04 LAB — COMPREHENSIVE METABOLIC PANEL
ALBUMIN: 3.8 g/dL (ref 3.5–5.0)
ALT: 15 U/L (ref 0–44)
AST: 13 U/L — ABNORMAL LOW (ref 15–41)
Alkaline Phosphatase: 145 U/L — ABNORMAL HIGH (ref 38–126)
Anion gap: 9 (ref 5–15)
BUN: 13 mg/dL (ref 6–20)
CHLORIDE: 107 mmol/L (ref 98–111)
CO2: 24 mmol/L (ref 22–32)
CREATININE: 0.89 mg/dL (ref 0.44–1.00)
Calcium: 9.5 mg/dL (ref 8.9–10.3)
GFR calc Af Amer: >60 mL/min (ref >60)
GFR calc non Af Amer: >60 mL/min (ref >60)
GLUCOSE: 139 mg/dL — AB (ref 70–99)
Potassium: 4.1 mmol/L (ref 3.5–5.1)
SODIUM: 140 mmol/L (ref 135–145)
Total Bilirubin: 0.3 mg/dL (ref 0.3–1.2)
Total Protein: 7.8 g/dL (ref 6.5–8.1)

## 2018-03-04 LAB — CBC WITH DIFFERENTIAL/PLATELET
ABS IMMATURE GRANULOCYTES: 0.01 10*3/uL (ref 0.00–0.07)
BASOS PCT: 1 %
Basophils Absolute: 0 10*3/uL (ref 0.0–0.1)
Eosinophils Absolute: 0.2 10*3/uL (ref 0.0–0.5)
Eosinophils Relative: 4 %
HCT: 37 % (ref 36.0–46.0)
HEMOGLOBIN: 12.3 g/dL (ref 12.0–15.0)
IMMATURE GRANULOCYTES: 0 %
LYMPHS PCT: 35 %
Lymphs Abs: 1.8 10*3/uL (ref 0.7–4.0)
MCH: 32.8 pg (ref 26.0–34.0)
MCHC: 33.2 g/dL (ref 30.0–36.0)
MCV: 98.7 fL (ref 80.0–100.0)
Monocytes Absolute: 0.3 10*3/uL (ref 0.1–1.0)
Monocytes Relative: 6 %
NEUTROS ABS: 2.9 10*3/uL (ref 1.7–7.7)
NEUTROS PCT: 54 %
PLATELETS: 154 10*3/uL (ref 150–400)
RBC: 3.75 MIL/uL — AB (ref 3.87–5.11)
RDW: 12.6 % (ref 11.5–15.5)
WBC: 5.3 10*3/uL (ref 4.0–10.5)
nRBC: 0 % (ref 0.0–0.2)

## 2018-03-19 ENCOUNTER — Other Ambulatory Visit: Payer: Self-pay

## 2018-03-19 DIAGNOSIS — Z95828 Presence of other vascular implants and grafts: Secondary | ICD-10-CM

## 2018-04-07 ENCOUNTER — Encounter (INDEPENDENT_AMBULATORY_CARE_PROVIDER_SITE_OTHER): Payer: Federal, State, Local not specified - PPO

## 2018-04-08 ENCOUNTER — Other Ambulatory Visit: Payer: Federal, State, Local not specified - PPO

## 2018-04-13 ENCOUNTER — Ambulatory Visit (INDEPENDENT_AMBULATORY_CARE_PROVIDER_SITE_OTHER): Payer: Federal, State, Local not specified - PPO | Admitting: Bariatrics

## 2018-04-15 ENCOUNTER — Ambulatory Visit: Payer: Federal, State, Local not specified - PPO | Admitting: Vascular Surgery

## 2018-04-17 ENCOUNTER — Ambulatory Visit
Admission: RE | Admit: 2018-04-17 | Discharge: 2018-04-17 | Disposition: A | Discharge status: Home / Self Care | Payer: Federal, State, Local not specified - PPO | Source: Ambulatory Visit | Attending: Vascular Surgery | Admitting: Vascular Surgery

## 2018-04-17 DIAGNOSIS — Z95828 Presence of other vascular implants and grafts: Secondary | ICD-10-CM

## 2018-04-22 ENCOUNTER — Ambulatory Visit: Payer: Federal, State, Local not specified - PPO | Admitting: Vascular Surgery

## 2018-04-22 ENCOUNTER — Encounter: Payer: Self-pay | Admitting: Vascular Surgery

## 2018-04-22 ENCOUNTER — Other Ambulatory Visit: Payer: Self-pay

## 2018-04-22 VITALS — BP 122/84 | HR 70 | Temp 98.1°F | Resp 16 | Ht 66.5 in | Wt 194.0 lb

## 2018-04-22 DIAGNOSIS — Z95828 Presence of other vascular implants and grafts: Secondary | ICD-10-CM

## 2018-04-22 NOTE — Progress Notes (Signed)
Patient name: [name] MRN: [id number] DOB: [birth year] Sex: female  REASON FOR VISIT:   Follow-up of IVC filter.  HPI:   [name] is a pleasant 63 y.o. female who I last saw on 12/10/2017.  This patient had developed a right lower extremity DVT in 2010.  She was on Coumadin but despite this had a pulmonary embolus.  This prompted placement of an IVC filter in March 2010 by interventional radiology.  A CT scan was done in 2017 which showed the filter in good position although there were 2 struts that appeared to be penetrating the wall of the inferior vena cava.  At that time given that she was not having symptoms I recommended a follow-up CT scan in 1 year.  The patient missed that follow-up appointment.  She is very reluctant to consider CT scan because of the risk of radiation.  However when I saw her last she agreed to have a follow-up study in March of this year.  The patient had a follow-up study on 04/17/2018 and comes in today to discuss these results.  The patient denies any abdominal pain or back pain.  She denies any nausea or vomiting.  She has had no fever or chills.  There have been no significant changes to her medical history.  She has a history of breast cancer and seems to have done very well with her treatment for this.  Of note this patient has a history of a right popliteal DVT last year and was treated with 6 months of Xarelto.  She is currently not on any anticoagulation.  Past Medical History:  Diagnosis Date  . Anxiety   . Arthritis    neck  . Breast cancer ([place]) 2018   Right Breast Cancer  . Cancer [facility])    right breast cancer  . DVT (deep venous thrombosis) ([place])    04/2010  . GERD (gastroesophageal reflux disease)   . Headache(784.0) 09/22/2012  . Heart murmur    dx in the 90s - said that it comes and goes - cardiologist said it was a mild murmur  . History of hiatal hernia   . History of radiation therapy 02/05/17-03/05/17   right breast 40.05 Gy  in 15 fractions, right breast boost 10 Gy in 5 fractions  . Hyperlipemia   . Hyperlipidemia   . Hypertension   . Hyperthyroidism   . Iron deficiency anemia   . Palpitations   . Personal history of radiation therapy   . Pulmonary embolism ([place])   . Uterine fibroid   . Vitamin D deficiency    Graves disease    Family History  Problem Relation Age of Onset  . Drug abuse Brother   . Hypertension Mother   . Colon cancer Maternal Grandfather     SOCIAL HISTORY: Social History   Tobacco Use  . Smoking status: Former Smoker    Packs/day: 0.40    Years: 10.00    Pack years: 4.00    Types: Cigarettes    Last attempt to quit: 06/01/2002    Years since quitting: 15.9  . Smokeless tobacco: Never Used  Substance Use Topics  . Alcohol use: Yes    Comment: occasional 1 drink per month    Allergies  Allergen Reactions  . Amoxicillin-Pot Clavulanate Other (See Comments)    Dizziness   . Niacin-Lovastatin Er Other (See Comments)    Caused flushing     Current Outpatient Medications  Medication Sig Dispense Refill  .  anastrozole (ARIMIDEX) 1 MG tablet Take 1 tablet (1 mg total) by mouth daily. 90 tablet 4  . Ascorbic Acid (VITAMIN C) 1000 MG tablet Take 1,000 mg by mouth daily.    Marland Kitchen aspirin EC 81 MG tablet Take 1 tablet (81 mg total) by mouth daily. 90 tablet 3  . candesartan (ATACAND) 4 MG tablet     . Cholecalciferol (VITAMIN D-3 PO) Take 1 capsule by mouth daily.    . Omega-3 Fatty Acids (FISH OIL) 1200 MG CAPS Take 1 capsule by mouth daily.    Vladimir Faster Glycol-Propyl Glycol (SYSTANE OP) Place 1 drop into both eyes daily as needed (dry eyes).      No current facility-administered medications for this visit.     REVIEW OF SYSTEMS:  [X]  denotes positive finding, [ ]  denotes negative finding Cardiac  Comments:  Chest pain or chest pressure:    Shortness of breath upon exertion:    Short of breath when lying flat:    Irregular heart rhythm:        Vascular    Pain in  calf, thigh, or hip brought on by ambulation:    Pain in feet at night that wakes you up from your sleep:     Blood clot in your veins:    Leg swelling:         Pulmonary    Oxygen at home:    Productive cough:     Wheezing:         Neurologic    Sudden weakness in arms or legs:     Sudden numbness in arms or legs:     Sudden onset of difficulty speaking or slurred speech:    Temporary loss of vision in one eye:     Problems with dizziness:         Gastrointestinal    Blood in stool:     Vomited blood:         Genitourinary    Burning when urinating:     Blood in urine:        Psychiatric    Major depression:         Hematologic    Bleeding problems:    Problems with blood clotting too easily:        Skin    Rashes or ulcers:        Constitutional    Fever or chills:     PHYSICAL EXAM:   Vitals:   04/22/18 1619  BP: 122/84  Pulse: 70  Resp: 16  Temp: 98.1 F (36.7 C)  TempSrc: Oral  SpO2: 98%  Weight: 194 lb (88 kg)  Height: 5' 6.5" (1.689 m)   GENERAL: The patient is a well-nourished female, in no acute distress. The vital signs are documented above. CARDIAC: There is a regular rate and rhythm.  VASCULAR: I do not detect carotid bruits. She has palpable dorsalis pedis pulses bilaterally. She has no significant lower extremity swelling. PULMONARY: There is good air exchange bilaterally without wheezing or rales. ABDOMEN: Soft and non-tender with normal pitched bowel sounds.  MUSCULOSKELETAL: There are no major deformities or cyanosis. NEUROLOGIC: No focal weakness or paresthesias are detected. SKIN: There are no ulcers or rashes noted. PSYCHIATRIC: The patient has a normal affect.  DATA:    CT ABDOMEN PELVIS: Her CT of the abdomen and pelvis was reviewed.  This was interpreted as showing some tilting of the IVC filter with the majority of the struts extending beyond the caval  wall and 1 of the struts appear to be penetrating the distal duodenal wall.   There was no superimposed inflammation.  I have reviewed the images myself and do not see any evidence of inflammation or reaction around this 1 strut that appears to be near the duodenum.  MEDICAL ISSUES:   STATUS POST IVC FILTER: This patient had a IVC filter placed in 2010 and has some struts protruding from the IVC.  1 of the struts appears to be near the duodenum however she is completely asymptomatic.  I have discussed her case with my partners and the consensus is that we would not consider removing the filter unless it became symptomatic.  Removal of the filter would be associated with significant risk.  I would favor a follow-up study and I think this would require a CT of the abdomen.  I do not think the details as related to the struts could be seen on ultrasound.  I have reviewed with her the potential symptoms that she could have if the protruding strut became an issue.  I think the potential symptoms could potentially include nausea, vomiting, a bowel obstruction, abdominal pain or fever.  She will call if she develops any new symptoms.  Deitra Mayo [facility] [phone]

## 2018-04-27 ENCOUNTER — Ambulatory Visit (INDEPENDENT_AMBULATORY_CARE_PROVIDER_SITE_OTHER): Payer: Federal, State, Local not specified - PPO | Admitting: Bariatrics

## 2018-07-08 ENCOUNTER — Telehealth: Payer: Self-pay

## 2018-07-08 NOTE — Telephone Encounter (Signed)
Pt called and said that she is having bilateral leg pain that is worse in the morning and gets better throughout the day as she is doing things.  Also complains of right side pain that comes and goes.   Spoke with Dr Scot Dock since he was in the office and he reviewed her chart. He suggest she have a repeat CT scan of abdomen to evaluate the IVC filter.   Pt notified and though she thinks it is awfully soon for a repeat CT scan - she agrees. Advised her we would set this up and call her with an appt.   York Cerise, CMA

## 2018-07-10 ENCOUNTER — Other Ambulatory Visit: Payer: Self-pay

## 2018-07-10 DIAGNOSIS — E0590 Thyrotoxicosis, unspecified without thyrotoxic crisis or storm: Secondary | ICD-10-CM | POA: Diagnosis not present

## 2018-07-10 DIAGNOSIS — Z95828 Presence of other vascular implants and grafts: Secondary | ICD-10-CM

## 2018-07-10 DIAGNOSIS — K5909 Other constipation: Secondary | ICD-10-CM | POA: Diagnosis not present

## 2018-07-10 DIAGNOSIS — R011 Cardiac murmur, unspecified: Secondary | ICD-10-CM | POA: Insufficient documentation

## 2018-07-10 DIAGNOSIS — M79606 Pain in leg, unspecified: Secondary | ICD-10-CM | POA: Diagnosis not present

## 2018-07-13 ENCOUNTER — Other Ambulatory Visit: Payer: Self-pay | Admitting: Oncology

## 2018-07-13 DIAGNOSIS — Z1231 Encounter for screening mammogram for malignant neoplasm of breast: Secondary | ICD-10-CM

## 2018-07-14 ENCOUNTER — Telehealth: Payer: Self-pay | Admitting: *Deleted

## 2018-07-14 NOTE — Telephone Encounter (Signed)
This RN returned call per communication from [place] stating pt called 07/13/2018 at 436pm with concern :  Caller states she has found a spot on her breast and is needing a referral to the [facility].  Per call- obtained VM- message left call is being returned - and requested for pt to call again to discuss concern for appropriate follow up.

## 2018-07-14 NOTE — Telephone Encounter (Signed)
This RN called pt per her message left with [facility] stating need for [facility] to [facility] due to area of concern on breast. Received VM- message left to return call for additional information for further recommendations.  Pt left VM on RN's phone - stating she has small " just a dot " " that is red but not a bump but feels dry if you rub it " She stated area is on the lower part of her left breast.  This RN returned call to given number - VM verifying correct number received- this RN again left message to return call hopefully for a  person to person discussion for appropriate recommendations.

## 2018-07-20 ENCOUNTER — Other Ambulatory Visit: Payer: Self-pay

## 2018-07-20 ENCOUNTER — Ambulatory Visit
Admission: RE | Admit: 2018-07-20 | Discharge: 2018-07-20 | Disposition: A | Discharge status: Home / Self Care | Payer: Federal, State, Local not specified - PPO | Source: Ambulatory Visit | Attending: Vascular Surgery | Admitting: Vascular Surgery

## 2018-07-20 ENCOUNTER — Inpatient Hospital Stay: Admission: RE | Admit: 2018-07-20 | Payer: Federal, State, Local not specified - PPO | Source: Ambulatory Visit

## 2018-07-20 ENCOUNTER — Telehealth: Payer: Self-pay | Admitting: *Deleted

## 2018-07-20 DIAGNOSIS — Z95828 Presence of other vascular implants and grafts: Secondary | ICD-10-CM

## 2018-07-20 NOTE — Telephone Encounter (Signed)
Virtual Visit Pre-Appointment Phone Call  Today, I spoke with [name] and performed the following actions:  1. I explained that we are currently trying to limit exposure to the COVID-19 virus by seeing patients at home rather than in the office.  I explained that the visits are best done by video, but can be done by telephone.  I asked the patient if a virtual visit that the patient would like to try instead of coming into the office. [name] agreed to proceed with the virtual visit scheduled with Dr. Scot Dock on 07/22/2018.          If the patient said yes, I continued with 2 (below).  If the patient said no, I discussed other options and documented those and did not continue to 2.  2. I confirmed the BEST phone number to call the day of the visit and- I included this in appointment notes.        If the patient said yes, I documented "VIDEO" in the appointment notes.  If the patient said no, I documented "PHONE" in the appointment notes.  3. I confirmed consent by  a. sending through [place] or by email the [place] as written at the end of this message or  b. verbally as listed below. i. This visit is being performed in the setting of COVID-19. ii. All virtual visits are billed to your insurance company just like a normal visit would be.   iii. We'd like you to understand that the technology does not allow for your provider to perform an examination, and thus may limit your provider's ability to fully assess your condition.  iv. If your provider identifies any concerns that need to be evaluated in person, we will make arrangements to do so.   v. Finally, though the technology is pretty good, we cannot assure that it will always work on either your or our end, and in the setting of a video visit, we may have to convert it to a phone-only visit.  In either situation, we cannot ensure that we have a secure connection.   vi. Are you willing to  proceed?"  STAFF: Did the patient verbally acknowledge consent to telehealth visit? Document YES/NO here: YES  2. I advised the patient to be prepared - I asked that the patient, on the day of her visit, record any information possible with the equipment at her home, such as blood pressure, pulse, oxygen saturation, and your weight and write them all down. I asked the patient to have a pen and paper handy nearby the day of the visit as well.  3. If the patient was scheduled for a video visit, I informed the patient that the visit with the doctor would start with a text to the smartphone # given to [place] by the patient.         If the patient was scheduled for a telephone call, I informed the patient that the visit with the doctor would start with a call to the telephone # given to [place] by the patient.  4. I Informed patient they will receive a phone call 15 minutes prior to their appointment time from a [place] or nurse to review medications, allergies, etc. to prepare for the visit.    TELEPHONE CALL NOTE  [name] has been deemed a candidate for a follow-up tele-health visit to limit community exposure during the Covid-19 pandemic. I spoke with the  patient via phone to ensure availability of phone/video source, confirm preferred email & phone number, and discuss instructions and expectations.  I reminded [name] to be prepared with any vital sign and/or heart rhythm information that could potentially be obtained via home monitoring, at the time of her visit. I reminded [name] to expect a phone call prior to her visit.  Cleaster Corin, NT 07/20/2018 12:31 PM     FULL LENGTH CONSENT FOR TELE-HEALTH VISIT   I hereby voluntarily request, consent and authorize [facility] and its employed or contracted physicians, physician assistants, nurse practitioners or other licensed health care professionals (the Practitioner), to provide me with telemedicine health care services (the  "Services") as deemed necessary by the treating Practitioner. I acknowledge and consent to receive the Services by the Practitioner via telemedicine. I understand that the telemedicine visit will involve communicating with the Practitioner through live audiovisual communication technology and the disclosure of certain medical information by electronic transmission. I acknowledge that I have been given the opportunity to request an in-person assessment or other available alternative prior to the telemedicine visit and am voluntarily participating in the telemedicine visit.  I understand that I have the right to withhold or withdraw my consent to the use of telemedicine in the course of my care at any time, without affecting my right to future care or treatment, and that the Practitioner or I may terminate the telemedicine visit at any time. I understand that I have the right to inspect all information obtained and/or recorded in the course of the telemedicine visit and may receive copies of available information for a reasonable fee.  I understand that some of the potential risks of receiving the Services via telemedicine include:  Marland Kitchen Delay or interruption in medical evaluation due to technological equipment failure or disruption; . Information transmitted may not be sufficient (e.g. poor resolution of images) to allow for appropriate medical decision making by the Practitioner; and/or  . In rare instances, security protocols could fail, causing a breach of personal health information.  Furthermore, I acknowledge that it is my responsibility to provide information about my medical history, conditions and care that is complete and accurate to the best of my ability. I acknowledge that Practitioner's advice, recommendations, and/or decision may be based on factors not within their control, such as incomplete or inaccurate data provided by me or distortions of diagnostic images or specimens that may result from  electronic transmissions. I understand that the practice of medicine is not an exact science and that Practitioner makes no warranties or guarantees regarding treatment outcomes. I acknowledge that I will receive a copy of this consent concurrently upon execution via email to the email address I last provided but may also request a printed copy by calling the office of [facility].    I understand that my insurance will be billed for this visit.   I have read or had this consent read to me. . I understand the contents of this consent, which adequately explains the benefits and risks of the Services being provided via telemedicine.  . I have been provided ample opportunity to ask questions regarding this consent and the Services and have had my questions answered to my satisfaction. . I give my informed consent for the services to be provided through the use of telemedicine in my medical care  By participating in this telemedicine visit I agree to the above.

## 2018-07-22 ENCOUNTER — Ambulatory Visit (INDEPENDENT_AMBULATORY_CARE_PROVIDER_SITE_OTHER): Payer: Federal, State, Local not specified - PPO | Admitting: Vascular Surgery

## 2018-07-22 ENCOUNTER — Encounter: Payer: Self-pay | Admitting: Vascular Surgery

## 2018-07-22 VITALS — BP 118/80 | HR 54

## 2018-07-22 DIAGNOSIS — Z95828 Presence of other vascular implants and grafts: Secondary | ICD-10-CM

## 2018-07-22 NOTE — Progress Notes (Signed)
Patient name: [name] DOB: 11-Sep-1955 Sex: female    Referring Provider is Long, Nicki Reaper, PA-C  PCP is Long, Event organiser, PA-C  REASON FOR VIRTUAL VISIT: Follow-up of IVC filter.  I connected with [name] on 07/22/18 at  2:20 PM EDT by a video enabled telemedicine application and verified that I am speaking with the correct person using two identifiers. I discussed the limitations of evaluation and management by telemedicine and the availability of in person appointments. The patient expressed understanding and agreed to proceed.  Location: Patient: Home  provider: Office  HPI: [name] is a 63 y.o. female who I last saw on 12/10/2017.  This patient had a right lower extremity DVT in 2010.  She had a pulmonary embolus despite being on Coumadin and this prompted placement of an IVC filter by interventional radiology on 05/09/2008.  CT scan in 2017 showed that 2 of the struts appear to be penetrating the wall of the inferior vena cava.  Given that the filter had been in place for 7 years I felt that removing the filter would be associated with significant risk.  I recommended a follow-up CT scan in 1 year.  The patient did not wish to get a CT scan.  She is very concerned about radiation because she has a history of breast cancer.  She did have a duplex scan at the time of her last visit which showed that the inferior vena cava and both common iliac veins were patent.  She was agreeable to come back in March with a CT scan at that time however situation was complicated by the EPPIR-51 pandemic.  She did have her CT abdomen pelvis that we had previously arranged in June of this year.  We attempted to establish a video conference call however she was unable to see or hear me although I could see her.  Thus we did a telephone call.  She had been having some vague abdominal pain and was given something for constipation which helped.  However subsequent to that she had a CT scan which did  not show any complicating factors related to her IVC filter that would explain her symptoms.  She did have a right popliteal DVT a couple years ago and was on Xarelto for a while but is no longer on Xarelto.  She denies any significant leg swelling.   Current Outpatient Medications  Medication Sig Dispense Refill  . anastrozole (ARIMIDEX) 1 MG tablet Take 1 tablet (1 mg total) by mouth daily. 90 tablet 4  . Ascorbic Acid (VITAMIN C) 1000 MG tablet Take 1,000 mg by mouth daily.    Marland Kitchen aspirin EC 81 MG tablet Take 1 tablet (81 mg total) by mouth daily. 90 tablet 3  . candesartan (ATACAND) 4 MG tablet     . Cholecalciferol (VITAMIN D-3 PO) Take 1 capsule by mouth daily.    . methimazole (TAPAZOLE) 5 MG tablet Take 5 mg by mouth daily.    . Omega-3 Fatty Acids (FISH OIL) 1200 MG CAPS Take 1 capsule by mouth daily.    Vladimir Faster Glycol-Propyl Glycol (SYSTANE OP) Place 1 drop into both eyes daily as needed (dry eyes).      No current facility-administered medications for this visit.    REVIEW OF SYSTEMS: Valu.Nieves ] denotes positive finding; [  ] denotes negative finding  CARDIOVASCULAR:  [ ]  chest pain   [ ]  dyspnea on exertion  [ ]  leg swelling  CONSTITUTIONAL:  [ ]   fever   [ ]  chills   OBSERVATIONS/OBJECTIVE: Vitals:   07/22/18 1358  BP: 118/80  Pulse: (!) 54   GENERAL: The patient is a well-nourished female, in no acute distress. The vital signs are documented above. When I saw her on video she did not appear to have any shortness of breath.  DATA:  CT ABDOMEN PELVIS: I reviewed the CT abdomen pelvis that was done on 07/20/2018.  This shows that many of the filter legs penetrate the IVC.  1 leg appears to be involved in the transverse segment of the duodenum but this is unchanged compared to previous studies.  There is no evidence of inflammation or bowel abnormality in this area.  There is no evidence of filter leg fracture.  The configuration and position of the filter is unchanged compared  to the previous CT scan.  MEDICAL ISSUES:  HISTORY OF IVC FILTER: Her CT scan shows no significant changes in her IVC filter that was placed in 2010.  She has some struts which are extruding but do not appear to be causing any complications.  She is very reluctant to have any more CT scans than she needs because of her concerns for radiation.  I think this is understandable.  For this reason I recommended an ultrasound of her IVC and common iliac veins in 2 years and I will see her back at that time.  Certainly if she develops any new abdominal pain or evidence of a bowel obstruction she would need repeat CT scan to see if anything it changed relative to her IVC filter.  She will call if she develops any new symptoms.  FOLLOW UP INSTRUCTIONS:   I discussed the assessment and treatment plan with the patient. The patient was provided an opportunity to ask questions and all were answered. The patient agreed with the plan and demonstrated an understanding of the instructions. The patient was advised to call back or seek an in-person evaluation if the symptoms worsen or if the condition fails to improve as anticipated.    Deitra Mayo [facility]

## 2018-08-04 DIAGNOSIS — Z95828 Presence of other vascular implants and grafts: Secondary | ICD-10-CM | POA: Insufficient documentation

## 2018-08-13 DIAGNOSIS — R140 Abdominal distension (gaseous): Secondary | ICD-10-CM | POA: Diagnosis not present

## 2018-08-13 DIAGNOSIS — Z800 Family history of malignant neoplasm of digestive organs: Secondary | ICD-10-CM | POA: Diagnosis not present

## 2018-08-13 DIAGNOSIS — K5900 Constipation, unspecified: Secondary | ICD-10-CM | POA: Diagnosis not present

## 2018-08-27 DIAGNOSIS — K5904 Chronic idiopathic constipation: Secondary | ICD-10-CM | POA: Diagnosis not present

## 2018-08-27 DIAGNOSIS — Z1211 Encounter for screening for malignant neoplasm of colon: Secondary | ICD-10-CM | POA: Diagnosis not present

## 2018-08-27 DIAGNOSIS — Z800 Family history of malignant neoplasm of digestive organs: Secondary | ICD-10-CM | POA: Diagnosis not present

## 2018-08-27 DIAGNOSIS — K625 Hemorrhage of anus and rectum: Secondary | ICD-10-CM | POA: Diagnosis not present

## 2018-10-20 ENCOUNTER — Other Ambulatory Visit: Payer: Self-pay | Admitting: Oncology

## 2018-10-20 DIAGNOSIS — Z9889 Other specified postprocedural states: Secondary | ICD-10-CM

## 2018-11-05 DIAGNOSIS — E559 Vitamin D deficiency, unspecified: Secondary | ICD-10-CM | POA: Diagnosis not present

## 2018-11-05 DIAGNOSIS — Z13228 Encounter for screening for other metabolic disorders: Secondary | ICD-10-CM | POA: Diagnosis not present

## 2018-11-05 DIAGNOSIS — E0500 Thyrotoxicosis with diffuse goiter without thyrotoxic crisis or storm: Secondary | ICD-10-CM | POA: Diagnosis not present

## 2018-11-05 DIAGNOSIS — E785 Hyperlipidemia, unspecified: Secondary | ICD-10-CM | POA: Diagnosis not present

## 2018-11-06 DIAGNOSIS — Z01419 Encounter for gynecological examination (general) (routine) without abnormal findings: Secondary | ICD-10-CM | POA: Diagnosis not present

## 2018-11-06 DIAGNOSIS — Z6830 Body mass index (BMI) 30.0-30.9, adult: Secondary | ICD-10-CM | POA: Diagnosis not present

## 2018-11-06 DIAGNOSIS — Z124 Encounter for screening for malignant neoplasm of cervix: Secondary | ICD-10-CM | POA: Diagnosis not present

## 2018-11-08 DIAGNOSIS — E785 Hyperlipidemia, unspecified: Secondary | ICD-10-CM | POA: Insufficient documentation

## 2018-11-08 NOTE — Progress Notes (Deleted)
Cardiology Office Note   Date:  11/08/2018   ID:  Laura, Allison 11/05/1955, MRN [id number]  PCP:  Shanon Rosser, PA-C  Cardiologist:   Minus Breeding, MD    No chief complaint on file.    History of Present Illness: [name] is a 63 y.o. female who presents for follow up of palpitations,  DVT and pulmonary embolism from 2010.  She had a negative hypercoagulable work-up.  An IVC filter was placed.  She then had breast cancer with lumpectomy and radiation. She did explore the possibility of removing the filter and she and Dr. Doren Custard and it was decided not to remove it.  ***    She apparently had recurrent DVT and Xarelto was added.  When Dr. Percival Spanish saw her in May 2019 she was still on Xarelto.  She had venous Dopplers in August 2018 that did not show new clot.  She had been treated for 6 months with Xarelto and it was discontinued at this time.  She saw Dr. Doren Custard in October for follow-up of her IVC filter.  There was a question about removing the filter.  A CT scan in 2017 showed that the filter was in good position although there were 2 struts that appeared to penetrate the inferior vena cava.  They did not appear to be causing any significant problems.  Given that the filter been placed many years ago Dr. Doren Custard felt removing the filter would be associated with a significant risk.  The patient is in the office today because she has had some intermittent right leg swelling and pain.  There is no significant swelling on exam in the office today.  In retrospect she thinks the pain may be more musculoskeletal.  She did have repeat Dopplers done since she was off Xarelto, this was on February 06, 2018.  This did not reveal a DVT and there was no change in therapy recommended.    I saw her last in Feb.   She has had palpitations in the past and reports a treadmill test years ago in [place].   She has a history of DVT/PE and IVC filter several years ago.  At the last visit I  checked a venous Dopler and there was evidence of old clot and I clot and new clot could not be excluded.  I started her on xarelto.   She has called with SOB and wanted a CT to rule out PE but this was not indicated.  Soon after this she was in the ED for SOB and she was treated for anxiety.   I reviewed these records for this visit.    Since her last saw her she has been diagnosed with sinus infection.  She has different somatic complaints including tinge of leg aching which is a discomfort really from sometimes her upper leg or hip down and seems to be positional.  She denies any new shortness of breath, PND or orthopnea.  She has no new chest pressure, neck or arm discomfort.  She does a little activity but not as much walking as I would like.    Past Medical History:  Diagnosis Date  . Anxiety   . Arthritis    neck  . Breast cancer ([place]) 2018   Right Breast Cancer  . Cancer [facility])    right breast cancer  . DVT (deep venous thrombosis) ([place])    04/2010  . GERD (gastroesophageal reflux disease)   . Headache(784.0) 09/22/2012  .  Heart murmur    dx in the 90s - said that it comes and goes - cardiologist said it was a mild murmur  . History of hiatal hernia   . History of radiation therapy 02/05/17-03/05/17   right breast 40.05 Gy in 15 fractions, right breast boost 10 Gy in 5 fractions  . Hyperlipemia   . Hyperlipidemia   . Hypertension   . Hyperthyroidism   . Iron deficiency anemia   . Palpitations   . Personal history of radiation therapy   . Pulmonary embolism ([place])   . Uterine fibroid   . Vitamin D deficiency    Graves disease    Past Surgical History:  Procedure Laterality Date  . BREAST LUMPECTOMY Right 12/16/2016  . BREAST LUMPECTOMY WITH RADIOACTIVE SEED AND SENTINEL LYMPH NODE BIOPSY Right 12/16/2016   Procedure: RIGHT BREAST LUMPECTOMY WITH RADIOACTIVE SEED AND SENTINEL LYMPH NODE BIOPSY;  Surgeon: Rolm Bookbinder, MD;  Location: [facility];  Service: General;   Laterality: Right;  . CHOLECYSTECTOMY    . COLONOSCOPY    . DILATION AND CURETTAGE OF UTERUS     01/2008  . ESOPHAGOGASTRODUODENOSCOPY    . ivc filter       Current Outpatient Medications  Medication Sig Dispense Refill  . anastrozole (ARIMIDEX) 1 MG tablet Take 1 tablet (1 mg total) by mouth daily. 90 tablet 4  . Ascorbic Acid (VITAMIN C) 1000 MG tablet Take 1,000 mg by mouth daily.    Marland Kitchen aspirin EC 81 MG tablet Take 1 tablet (81 mg total) by mouth daily. 90 tablet 3  . candesartan (ATACAND) 4 MG tablet     . Cholecalciferol (VITAMIN D-3 PO) Take 1 capsule by mouth daily.    . methimazole (TAPAZOLE) 5 MG tablet Take 5 mg by mouth daily.    . Omega-3 Fatty Acids (FISH OIL) 1200 MG CAPS Take 1 capsule by mouth daily.    Vladimir Faster Glycol-Propyl Glycol (SYSTANE OP) Place 1 drop into both eyes daily as needed (dry eyes).      No current facility-administered medications for this visit.     Allergies:   Amoxicillin-pot clavulanate and Niacin-lovastatin er    ROS:  Please see the history of present illness.   Otherwise, review of systems are positive for ***.   All other systems are reviewed and negative.    PHYSICAL EXAM: VS:  LMP 06/18/2010  , BMI There is no height or weight on file to calculate BMI.  GENERAL:  Well appearing NECK:  No jugular venous distention, waveform within normal limits, carotid upstroke brisk and symmetric, no bruits, no thyromegaly LUNGS:  Clear to auscultation bilaterally CHEST:  Unremarkable HEART:  PMI not displaced or sustained,S1 and S2 within normal limits, no S3, no S4, no clicks, no rubs, *** murmurs ABD:  Flat, positive bowel sounds normal in frequency in pitch, no bruits, no rebound, no guarding, no midline pulsatile mass, no hepatomegaly, no splenomegaly EXT:  2 plus pulses throughout, no edema, no cyanosis no clubbing    ***GENERAL:  Well appearing NECK:  No jugular venous distention, waveform within normal limits, carotid upstroke brisk  and symmetric, no bruits, no thyromegaly LUNGS:  Clear to auscultation bilaterally CHEST:  Unremarkable HEART:  PMI not displaced or sustained,S1 and S2 within normal limits, no S3, no S4, no clicks, no rubs, no murmurs ABD:  Flat, positive bowel sounds normal in frequency in pitch, no bruits, no rebound, no guarding, no midline pulsatile mass, no hepatomegaly, no splenomegaly EXT:  2 plus pulses throughout, no edema, no cyanosis no clubbing    EKG:  EKG is *** ordered today. ***  Recent Labs: 03/04/2018: ALT 15; BUN 13; Creatinine, Ser 0.89; Hemoglobin 12.3; Platelets 154; Potassium 4.1; Sodium 140    Lipid Panel    Component Value Date/Time   CHOL 152 03/21/2017 1141   TRIG 63 03/21/2017 1141   HDL 49 03/21/2017 1141   CHOLHDL 3.1 03/21/2017 1141   CHOLHDL 4.4 04/06/2010 1037   VLDL 16 04/06/2010 1037   LDLCALC 90 03/21/2017 1141      Wt Readings from Last 3 Encounters:  04/22/18 194 lb (88 kg)  03/04/18 193 lb 14.4 oz (88 kg)  02/16/18 196 lb (88.9 kg)      Other studies Reviewed: Additional studies/ records that were reviewed today include:    *** Review of the above records demonstrates:  ***    ASSESSMENT AND PLAN:  Murmur:  She had a normal echo in 2018.   *** No further work u it was impossible to tell whether she had a new or old clot on thep.  HTN:   Her blood pressure is *** upper limits of normal.  We are treating this with therapeutic lifestyle changes.    DVT:  ***  It was impossible to tell whether she had new or old clot at the Doppler in February.  I treated her with Xarelto given her complaints.  My plan is to complete a 48-month course given the severity of previous presentations and her symptoms.  I will reassess with another Doppler in 3 months and most likely be able to stop the blood thinner.  She and I have had long discussions about this and she is had some difficulty understanding the difference between chronic clot and the difficulty in  assessing this  Dyslipidemia:   ***  HDL and LDL were at acceptable limits.   No change in therapy.   Current medicines are reviewed at length with the patient today.  The patient does not have concerns regarding medicines.  The following changes have been made:   ***  Labs/ tests ordered today include:   ***  No orders of the defined types were placed in this encounter.    Disposition:   FU with me in ***  months. Ronnell Guadalajara, MD  11/08/2018 3:43 PM    [place]

## 2018-11-10 ENCOUNTER — Ambulatory Visit: Payer: Federal, State, Local not specified - PPO | Admitting: Cardiology

## 2018-11-17 ENCOUNTER — Other Ambulatory Visit: Payer: Self-pay

## 2018-11-17 ENCOUNTER — Ambulatory Visit
Admission: RE | Admit: 2018-11-17 | Discharge: 2018-11-17 | Disposition: A | Discharge status: Home / Self Care | Payer: Federal, State, Local not specified - PPO | Source: Ambulatory Visit | Attending: Oncology | Admitting: Oncology

## 2018-11-17 DIAGNOSIS — Z9889 Other specified postprocedural states: Secondary | ICD-10-CM

## 2018-11-17 DIAGNOSIS — R922 Inconclusive mammogram: Secondary | ICD-10-CM | POA: Diagnosis not present

## 2018-12-05 IMAGING — MG DIGITAL DIAGNOSTIC BILATERAL MAMMOGRAM WITH TOMO AND CAD
9 series · 9 of 25 positions shown · non-contrast
Comparison: Previous exam(s).

CLINICAL DATA: 61-year-old female presenting for routine annual
surveillance status post right breast lumpectomy in 5540.

EXAM:
DIGITAL DIAGNOSTIC BILATERAL MAMMOGRAM WITH CAD AND TOMO

[R CC]
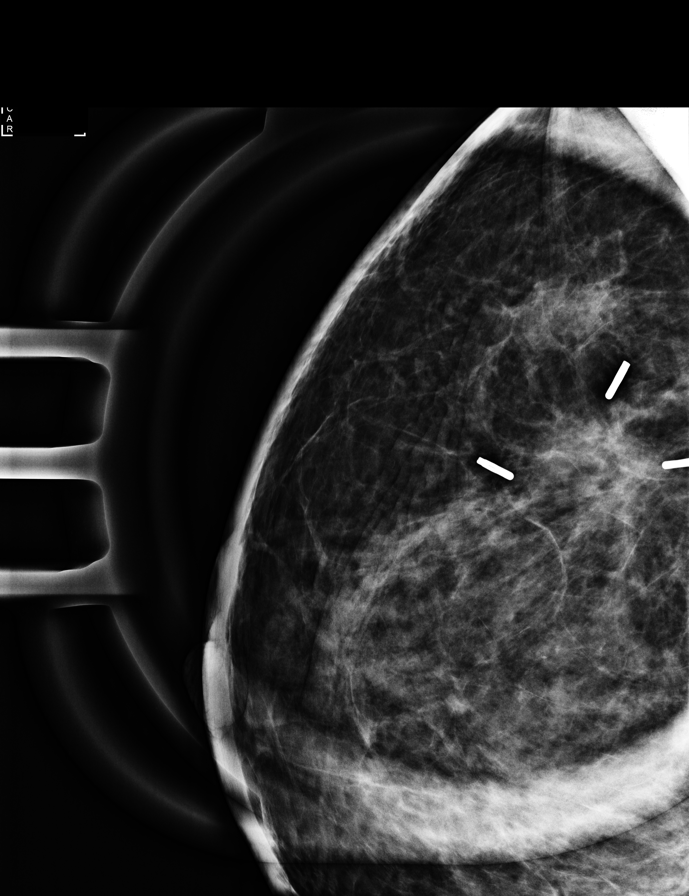

[R MLO synth-2D]
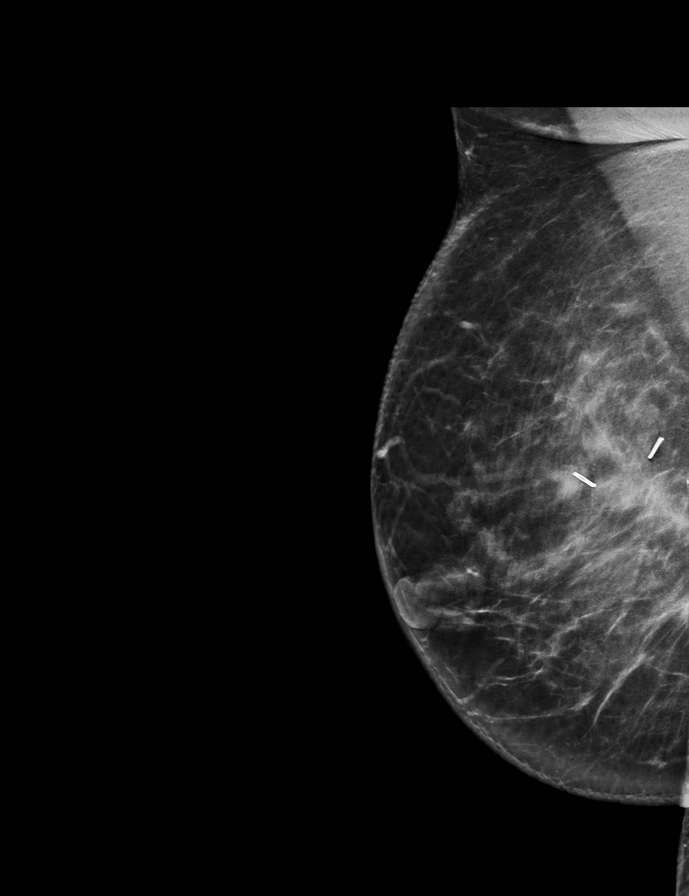

[L MLO synth-2D]
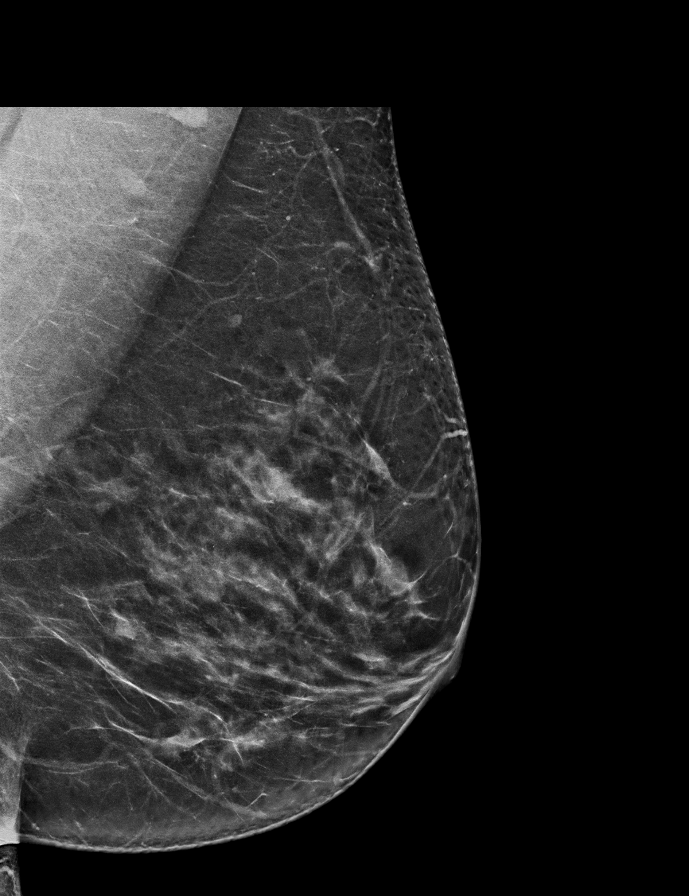

[R CC synth-2D]
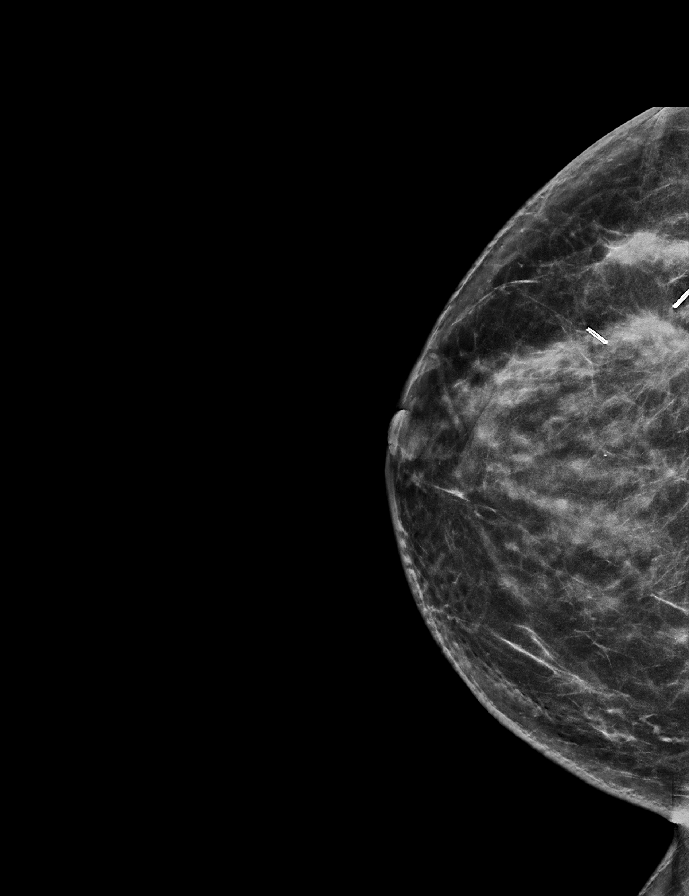

[L CC synth-2D]
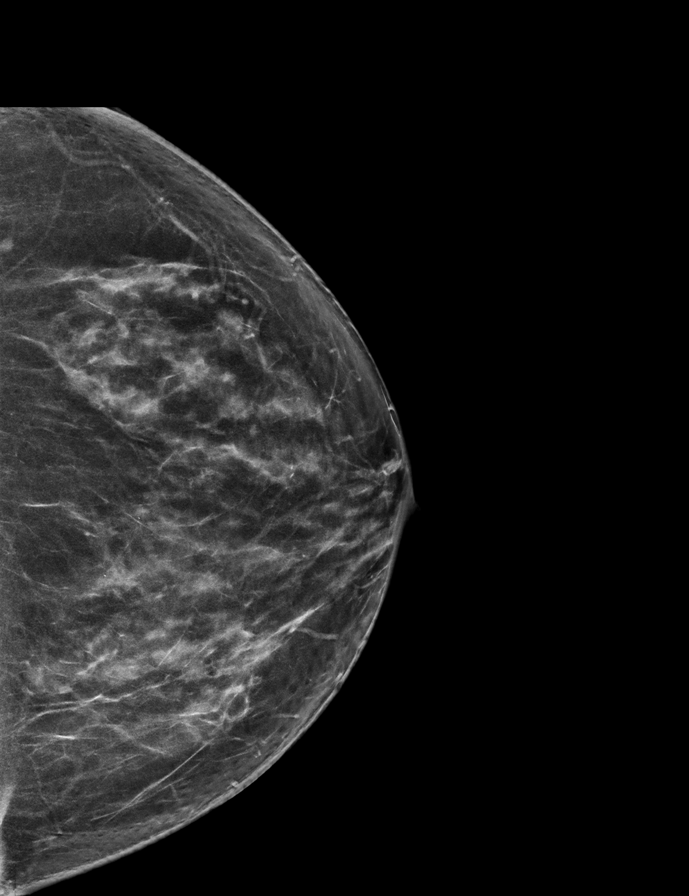

[R MLO tomo · tomo slice 41/82.0]
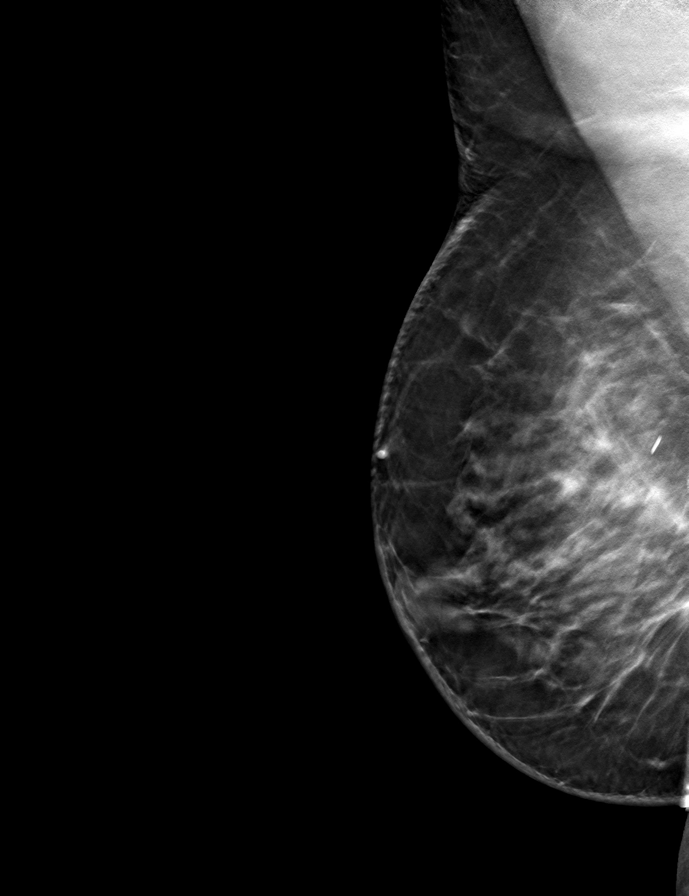

[L MLO tomo · tomo slice 37/73.0]
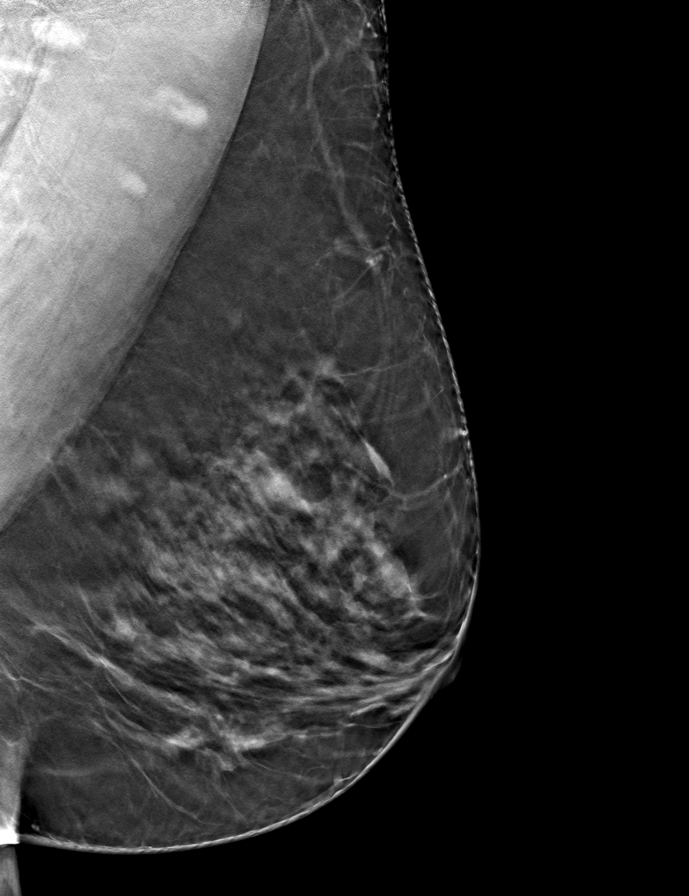

[L CC tomo · tomo slice 36/71.0]
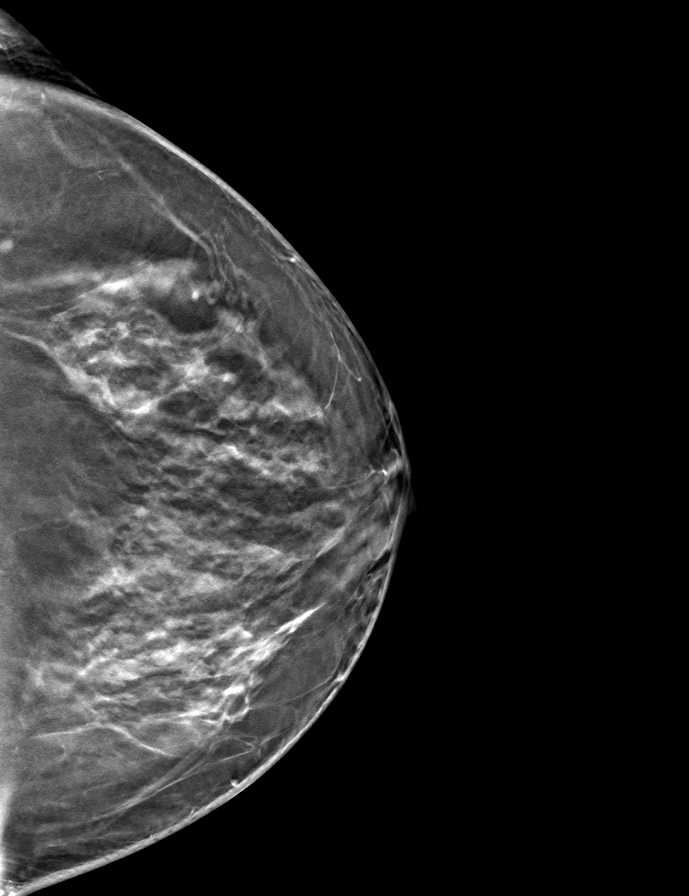

[R CC tomo · tomo slice 37/73.0]
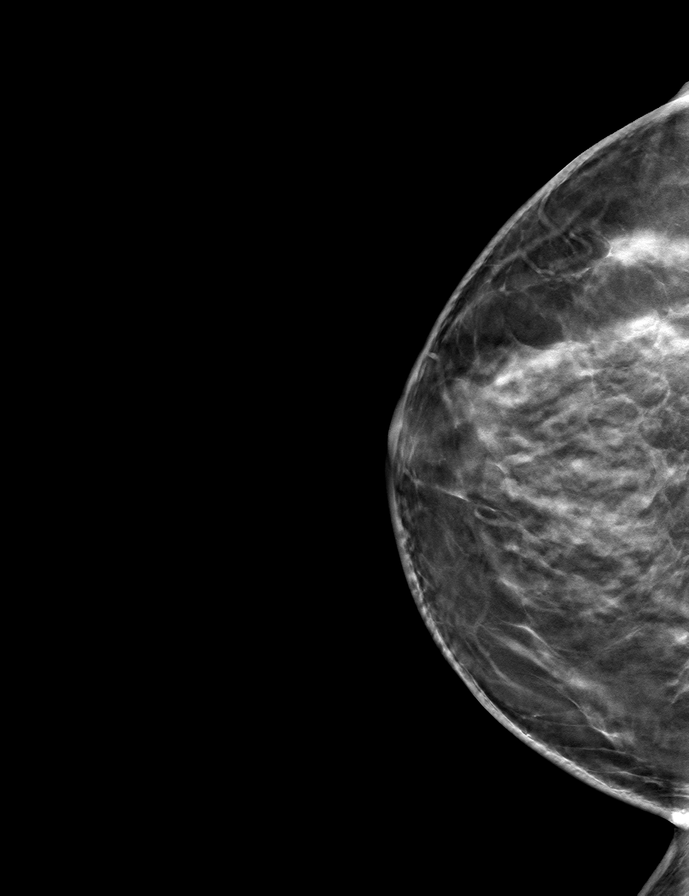

[9 of 25 positions shown; findings below may reference images not displayed]

ACR Breast Density Category c: The breast tissue is heterogeneously
dense, which may obscure small masses.
FINDINGS: Interval surgical changes in the lateral aspect of the right breast
are consistent with history of lumpectomy. No suspicious
calcifications, masses or areas of distortion are seen in the
bilateral breasts.

Mammographic images were processed with CAD.
IMPRESSION: Expected surgical changes in the lateral right breast consistent
with lumpectomy. No mammographic evidence of malignancy in the
bilateral breasts.

RECOMMENDATION:
Diagnostic mammogram is suggested in 1 year. (Code:EB-[id number])

I have discussed the findings and recommendations with the patient.
Results were also provided in writing at the conclusion of the
visit. If applicable, a reminder letter will be sent to the patient
regarding the next appointment.

BI-RADS CATEGORY  2: Benign.

## 2018-12-07 ENCOUNTER — Encounter: Payer: Self-pay | Admitting: Physician Assistant

## 2018-12-07 ENCOUNTER — Ambulatory Visit: Payer: Federal, State, Local not specified - PPO | Admitting: Physician Assistant

## 2018-12-07 NOTE — Progress Notes (Deleted)
Cardiology Office Note   Date:  12/07/2018   ID:  Laura, Allison 1955/08/09, MRN [id number]  PCP:  Laura Rosser, PA-C Cardiologist:  Laura Breeding, MD 06/23/2017 Electrphysiologist: None  [name], [facility] 02/16/2018 Laura Ferries, PA-C   No chief complaint on file.   History of Present Illness: [name] is a 63 y.o. female with a history of DVT/PE 2010 w/ neg hypercoag eval s/p IVC filter>>Xarelto after repeat DVT (d/c 06/2017), chronic R popliteal DVT on [place], breast CA s/p lumpectomy & XRT, HTN, HLD, Hyperthyroid, HH.    [name] presents for ***   Past Medical History:  Diagnosis Date  . Anxiety   . Arthritis    neck  . Breast cancer ([place]) 2018   Right Breast Cancer  . DVT (deep venous thrombosis) ([place])    04/2010  . GERD (gastroesophageal reflux disease)   . Headache(784.0) 09/22/2012  . Heart murmur    dx in the 90s - said that it comes and goes - cardiologist said it was a mild murmur  . History of hiatal hernia   . History of radiation therapy 02/05/2017   02/05/17-03/05/17,  right breast 40.05 Gy in 15 fractions, right breast boost 10 Gy in 5 fractions  . Hyperlipidemia   . Hypertension   . Hyperthyroidism   . Iron deficiency anemia   . Palpitations   . Pulmonary embolism ([place]) 2010  . Uterine fibroid   . Vitamin D deficiency    Graves disease    Past Surgical History:  Procedure Laterality Date  . BREAST LUMPECTOMY Right 12/16/2016  . BREAST LUMPECTOMY WITH RADIOACTIVE SEED AND SENTINEL LYMPH NODE BIOPSY Right 12/16/2016   Procedure: RIGHT BREAST LUMPECTOMY WITH RADIOACTIVE SEED AND SENTINEL LYMPH NODE BIOPSY;  Surgeon: Rolm Bookbinder, MD;  Location: [facility];  Service: General;  Laterality: Right;  . CHOLECYSTECTOMY    . COLONOSCOPY    . DILATION AND CURETTAGE OF UTERUS     01/2008  . ESOPHAGOGASTRODUODENOSCOPY    . ivc filter      Current Outpatient Medications  Medication Sig Dispense Refill  . anastrozole (ARIMIDEX) 1 MG  tablet Take 1 tablet (1 mg total) by mouth daily. 90 tablet 4  . Ascorbic Acid (VITAMIN C) 1000 MG tablet Take 1,000 mg by mouth daily.    Marland Kitchen aspirin EC 81 MG tablet Take 1 tablet (81 mg total) by mouth daily. 90 tablet 3  . candesartan (ATACAND) 4 MG tablet     . Cholecalciferol (VITAMIN D-3 PO) Take 1 capsule by mouth daily.    . methimazole (TAPAZOLE) 5 MG tablet Take 5 mg by mouth daily.    . Omega-3 Fatty Acids (FISH OIL) 1200 MG CAPS Take 1 capsule by mouth daily.    Vladimir Faster Glycol-Propyl Glycol (SYSTANE OP) Place 1 drop into both eyes daily as needed (dry eyes).      No current facility-administered medications for this visit.     Allergies:   Amoxicillin-pot clavulanate and Niacin-lovastatin er    Social History:  The patient  reports that she quit smoking about 16 years ago. Her smoking use included cigarettes. She has a 4.00 pack-year smoking history. She has never used smokeless tobacco. She reports current alcohol use. She reports that she does not use drugs.   Family History:  The patient's family history includes Colon cancer in her maternal grandfather; Drug abuse in her brother; Hypertension in her mother.  She indicated that her mother is  alive. She indicated that her father is deceased. She indicated that her brother is deceased. She indicated that her maternal grandmother is deceased. She indicated that her maternal grandfather is deceased. She indicated that her paternal grandmother is deceased. She indicated that her paternal grandfather is deceased.    ROS:  Please see the history of present illness. All other systems are reviewed and negative.    PHYSICAL EXAM: VS:  LMP 06/18/2010  , BMI There is no height or weight on file to calculate BMI. GEN: Well nourished, well developed, female in no acute distress HEENT: normal for age  Neck: no JVD, no carotid bruit, no masses Cardiac: RRR; no murmur, no rubs, or gallops Respiratory:  clear to auscultation  bilaterally, normal work of breathing GI: soft, nontender, nondistended, + BS MS: no deformity or atrophy; no edema; distal pulses are 2+ in all 4 extremities  Skin: warm and dry, no rash Neuro:  Strength and sensation are intact Psych: euthymic mood, full affect   EKG:  EKG {ACTION; IS/IS [id number] ordered today. The ekg ordered today demonstrates ***  ECHO: 02/21/2016 - Left ventricle: The cavity size was normal. Wall thickness was   normal. Systolic function was normal. The estimated ejection   fraction was in the range of 60% to 65%. Wall motion was normal;   there were no regional wall motion abnormalities. Doppler   parameters are consistent with abnormal left ventricular   relaxation (grade 1 diastolic dysfunction). - Pulmonary arteries: Systolic pressure was mildly increased. PA   peak pressure: 31 mm Hg (S).   CATH: ***  MONITOR: ***   Recent Labs: 03/04/2018: ALT 15; BUN 13; Creatinine, Ser 0.89; Hemoglobin 12.3; Platelets 154; Potassium 4.1; Sodium 140  CBC    Component Value Date/Time   WBC 5.3 03/04/2018 1359   RBC 3.75 (L) 03/04/2018 1359   HGB 12.3 03/04/2018 1359   HGB 12.5 11/20/2016 0822   HCT 37.0 03/04/2018 1359   HCT 38.2 11/20/2016 0822   PLT 154 03/04/2018 1359   PLT 158 11/20/2016 0822   MCV 98.7 03/04/2018 1359   MCV 97.9 11/20/2016 0822   MCH 32.8 03/04/2018 1359   MCHC 33.2 03/04/2018 1359   RDW 12.6 03/04/2018 1359   RDW 13.1 11/20/2016 0822   LYMPHSABS 1.8 03/04/2018 1359   LYMPHSABS 2.4 11/20/2016 0822   MONOABS 0.3 03/04/2018 1359   MONOABS 0.3 11/20/2016 0822   EOSABS 0.2 03/04/2018 1359   EOSABS 0.1 11/20/2016 0822   BASOSABS 0.0 03/04/2018 1359   BASOSABS 0.0 11/20/2016 0822   CMP Latest Ref Rng & Units 03/04/2018 01/15/2018 12/15/2017  Glucose 70 - 99 mg/dL 139(H) 130(H) 100(H)  BUN 6 - 20 mg/dL 13 15 14   Creatinine 0.44 - 1.00 mg/dL 0.89 1.14(H) 0.98  Sodium 135 - 145 mmol/L 140 142 140  Potassium 3.5 - 5.1 mmol/L 4.1  4.3 4.0  Chloride 98 - 111 mmol/L 107 107 106  CO2 22 - 32 mmol/L 24 28 26   Calcium 8.9 - 10.3 mg/dL 9.5 9.8 9.9  Total Protein 6.5 - 8.1 g/dL 7.8 8.4(H) 8.1  Total Bilirubin 0.3 - 1.2 mg/dL 0.3 0.4 0.5  Alkaline Phos 38 - 126 U/L 145(H) 174(H) 150(H)  AST 15 - 41 U/L 13(L) 12(L) 18  ALT 0 - 44 U/L 15 15 19      Lipid Panel Lab Results  Component Value Date   CHOL 152 03/21/2017   HDL 49 03/21/2017   LDLCALC 90 03/21/2017   TRIG 63  03/21/2017   CHOLHDL 3.1 03/21/2017      Wt Readings from Last 3 Encounters:  04/22/18 194 lb (88 kg)  03/04/18 193 lb 14.4 oz (88 kg)  02/16/18 196 lb (88.9 kg)     Other studies Reviewed: Additional studies/ records that were reviewed today include: Office notes, hospital records and testing.  ASSESSMENT AND PLAN:  1.    2. DVT, s/p IVC filter - Dr Scot Dock follows the IVC filter, last saw her 07/2018 - per his note: CT abdomen pelvis was Allison on 07/20/2018.  This shows that many of the filter legs penetrate the IVC.  1 leg appears to be involved in the transverse segment of the duodenum but this is unchanged compared to previous studies.  There is no evidence of inflammation or bowel abnormality in this area.  There is no evidence of filter leg fracture.  The configuration and position of the filter is unchanged compared to the previous CT scan.   Current medicines are reviewed at length with the patient today.  The patient {ACTIONS; HAS/DOES NOT HAVE:19233} concerns regarding medicines.  The following changes have been made:  {PLAN; NO CHANGE:13088:s}  Labs/ tests ordered today include:  No orders of the defined types were placed in this encounter.    Disposition:   FU with Laura Breeding, MD  Signed, Laura Ferries, PA-C  12/07/2018 8:50 AM    [place] Phone: [phone]; Fax: [phone]

## 2018-12-16 DIAGNOSIS — Z6829 Body mass index (BMI) 29.0-29.9, adult: Secondary | ICD-10-CM | POA: Diagnosis not present

## 2018-12-16 DIAGNOSIS — N950 Postmenopausal bleeding: Secondary | ICD-10-CM | POA: Diagnosis not present

## 2018-12-18 ENCOUNTER — Other Ambulatory Visit: Payer: Self-pay | Admitting: Lab

## 2018-12-18 DIAGNOSIS — F418 Other specified anxiety disorders: Secondary | ICD-10-CM | POA: Diagnosis not present

## 2018-12-18 DIAGNOSIS — R0981 Nasal congestion: Secondary | ICD-10-CM | POA: Diagnosis not present

## 2018-12-19 NOTE — Progress Notes (Signed)
[place]  Telephone:(336) [phone] [phone]) [phone]     ID: [name] DOB: February 11, 1956  MR#: [id number]  LYY#:[id number]  Patient Care Team: Shanon Rosser, PA-C as PCP - General (Physician Assistant) Minus Breeding, MD as PCP - Cardiology (Cardiology) Rolm Bookbinder, MD as Consulting Physician (General Surgery) Merla Sawka, Virgie Dad, MD as Consulting Physician (Oncology) Gery Pray, MD as Consulting Physician (Radiation Oncology) OTHER MD:   CHIEF COMPLAINT: Estrogen receptor positive breast cancer  CURRENT TREATMENT: anastrozole   INTERVAL HISTORY: [name] returns today for follow-up of her estrogen receptor positive breast cancer.   She continues on anastrozole.  She tolerates this well, with no significant side effects that she is aware of.  She obtains it under good price.  Since her last visit, she underwent bilateral diagnostic mammography with tomography at [facility] on 10/6/020 showing: breast density category C; no evidence of malignancy in either breast.  [name]'s last bone density screening on 07/21/2015 at the [facility], showed a T-score of -0.3, which is considered normal.  She is scheduled for repeat exam on 12/28/2018.  She has also undergone two abdomen/pelvis CTs to follow up on her IVC filter. The most recent, performed on 07/20/2018, showed: no acute abnormality; stable appearance of IVC filter.   REVIEW OF SYSTEMS: [name] retired from her job last week.  She had hoped to work another year on the third so she could get the full 80% pension, but there were some issues relating to her supervisor and she just could not think of working there that long under those circumstances.  She did end up with 78% of the pension.  She is very smart about money management and has set herself generally quite well for retirement at this point.  She continues to be very concerned about her grandmother, who has fallen twice recently and also broke  her left arm as a result of one of the falls.  Aside from these issues a detailed review of systems today was stable   HISTORY OF CURRENT ILLNESS: From the original intake note:  [name] had routine bilateral screening mammography with tomography at the [facility] 10/08/2016. An area of possible distortion in the right breast was noted. She was recalled for right diagnostic mammography with tomography on 11/08/2016. This found the breast density to be category C. The small area of architectural distortion in the lateral right breast persisted and on 11/11/2016 biopsy of this area showed ([id number]) invasive ductal carcinoma, E-cadherin positive, estrogen receptor 95% positive with strong staining intensity, progesterone receptor 5% positive, with strong staining intensity, with an MIB-1 of 10%, and no HER-2 amplification, with a signals ratio of 1.51 and number per cell 3.40. Right axillary ultrasound 11/13/2016 was sonographically benign.  Of note, she has a history of DVT diagnosed March 2010, felt to be possibly related to estrogen replacement therapy. She was coumadinized but developed a pulmonary embolus while on Coumadin. She was then had an IVC filter placed and was anticoagulated with Lovenox for 2 years. An extensive hypercoagulable workup was negative except for an elevated factor VIII level. Her IVC filter is still in place and the patient tells me she is participating in a lawsuit regarding that.   The patient's subsequent history is as detailed below.   PAST MEDICAL HISTORY: Past Medical History:  Diagnosis Date  . Anxiety   . Arthritis    neck  . Breast cancer ([place]) 2018   Right Breast Cancer  . DVT (deep  venous thrombosis) ([place])    04/2010  . GERD (gastroesophageal reflux disease)   . Headache(784.0) 09/22/2012  . Heart murmur    dx in the 90s - said that it comes and goes - cardiologist said it was a mild murmur  . History of hiatal hernia   . History of radiation  therapy 02/05/2017   02/05/17-03/05/17,  right breast 40.05 Gy in 15 fractions, right breast boost 10 Gy in 5 fractions  . Hyperlipidemia   . Hypertension   . Hyperthyroidism   . Iron deficiency anemia   . Palpitations   . Pulmonary embolism ([place]) 2010  . Uterine fibroid   . Vitamin D deficiency    Graves disease    PAST SURGICAL HISTORY: Past Surgical History:  Procedure Laterality Date  . BREAST LUMPECTOMY Right 12/16/2016  . BREAST LUMPECTOMY WITH RADIOACTIVE SEED AND SENTINEL LYMPH NODE BIOPSY Right 12/16/2016   Procedure: RIGHT BREAST LUMPECTOMY WITH RADIOACTIVE SEED AND SENTINEL LYMPH NODE BIOPSY;  Surgeon: Rolm Bookbinder, MD;  Location: [facility];  Service: General;  Laterality: Right;  . CHOLECYSTECTOMY    . COLONOSCOPY    . DILATION AND CURETTAGE OF UTERUS     01/2008  . ESOPHAGOGASTRODUODENOSCOPY    . ivc filter      FAMILY HISTORY Family History  Problem Relation Age of Onset  . Drug abuse Brother   . Hypertension Mother   . Colon cancer Maternal Grandfather    She notes that her father died at age 47 from an accidental head injury.The patient's mother is 21 years old as of October 2018. Pt has one brother and no sisters. Pt reports that a second cousin has a hx of breast cancer and was dx in her 95's. Pt denies family hx of ovarian cancer.   GYNECOLOGIC HISTORY:  Patient's last menstrual period was 06/18/2010. Menarche: 63 years old Age at first live birth: No children GP: GXP0 LMP: March 2016 Contraceptive: OCP on and off for many years d/c after DVT and PE diagnosis.  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  She retired November 2020.  She denies having pets at home.     ADVANCED DIRECTIVES: Not in place. At the 11/20/2016 visit the patient was given the appropriate documents to complete and notarize at her discretion   HEALTH MAINTENANCE: Social History    Tobacco Use  . Smoking status: Former Smoker    Packs/day: 0.40    Years: 10.00    Pack years: 4.00    Types: Cigarettes    Quit date: 06/01/2002    Years since quitting: 16.5  . Smokeless tobacco: Never Used  Substance Use Topics  . Alcohol use: Yes    Comment: occasional 1 drink per month  . Drug use: No     Colonoscopy: 2013  PAP: 08/30/2016   Bone density: 10/04/2015 with T-score of -0.3 at femur neck right   Allergies  Allergen Reactions  . Amoxicillin-Pot Clavulanate Other (See Comments)    Dizziness   . Niacin-Lovastatin Er Other (See Comments)    Caused flushing     Current Outpatient Medications  Medication Sig Dispense Refill  . anastrozole (ARIMIDEX) 1 MG tablet Take 1 tablet (1 mg total) by mouth daily. 90 tablet 4  . Ascorbic Acid (VITAMIN C) 1000 MG tablet Take 1,000 mg by mouth daily.    Marland Kitchen aspirin EC 81 MG tablet Take 1  tablet (81 mg total) by mouth daily. 90 tablet 3  . candesartan (ATACAND) 4 MG tablet     . Cholecalciferol (VITAMIN D-3 PO) Take 1 capsule by mouth daily.    . methimazole (TAPAZOLE) 5 MG tablet Take 5 mg by mouth daily.    . Omega-3 Fatty Acids (FISH OIL) 1200 MG CAPS Take 1 capsule by mouth daily.    Vladimir Faster Glycol-Propyl Glycol (SYSTANE OP) Place 1 drop into both eyes daily as needed (dry eyes).      No current facility-administered medications for this visit.     OBJECTIVE: Middle-aged African-American woman in no acute distress  Vitals:   12/21/18 1114  BP: (!) 141/79  Pulse: (!) 55  Resp: 18  Temp: 97.9 F (36.6 C)  SpO2: 100%     Body mass index is 29.16 kg/m.   Wt Readings from Last 3 Encounters:  12/21/18 183 lb 6.4 oz (83.2 kg)  04/22/18 194 lb (88 kg)  03/04/18 193 lb 14.4 oz (88 kg)      ECOG FS:1 - Symptomatic but completely ambulatory  Sclerae unicteric, EOMs intact Wearing a mask No cervical or supraclavicular adenopathy Lungs no rales or rhonchi Heart regular rate and rhythm Abd soft, nontender,  positive bowel sounds MSK no focal spinal tenderness, no upper extremity lymphedema Neuro: nonfocal, well oriented, appropriate affect Breasts: The right breast is status post lumpectomy followed by radiation with no evidence of disease recurrence.  The left breast is benign.  Both axillae are benign.   LAB RESULTS:  CMP     Component Value Date/Time   NA 142 12/21/2018 1048   NA 141 11/20/2016 0822   K 4.1 12/21/2018 1048   K 3.6 11/20/2016 0822   CL 108 12/21/2018 1048   CL 105 06/24/2012 1548   CO2 25 12/21/2018 1048   CO2 24 11/20/2016 0822   GLUCOSE 111 (H) 12/21/2018 1048   GLUCOSE 102 11/20/2016 0822   GLUCOSE 91 06/24/2012 1548   BUN 14 12/21/2018 1048   BUN 19.2 11/20/2016 0822   CREATININE 0.88 12/21/2018 1048   CREATININE 0.9 11/20/2016 0822   CALCIUM 9.4 12/21/2018 1048   CALCIUM 9.5 11/20/2016 0822   PROT 7.7 12/21/2018 1048   PROT 7.9 03/21/2017 1141   PROT 8.2 11/20/2016 0822   ALBUMIN 3.9 12/21/2018 1048   ALBUMIN 4.3 03/21/2017 1141   ALBUMIN 3.8 11/20/2016 0822   AST 15 12/21/2018 1048   AST 15 11/20/2016 0822   ALT 15 12/21/2018 1048   ALT 15 11/20/2016 0822   ALKPHOS 156 (H) 12/21/2018 1048   ALKPHOS 122 11/20/2016 0822   BILITOT 0.5 12/21/2018 1048   BILITOT 0.5 03/21/2017 1141   BILITOT 0.52 11/20/2016 0822   GFRNONAA >60 12/21/2018 1048   GFRAA >60 12/21/2018 1048    No results found for: TOTALPROTELP, ALBUMINELP, A1GS, A2GS, BETS, BETA2SER, GAMS, MSPIKE, SPEI  No results found for: KPAFRELGTCHN, LAMBDASER, KAPLAMBRATIO  Lab Results  Component Value Date   WBC 3.8 (L) 12/21/2018   NEUTROABS 2.1 12/21/2018   HGB 11.3 (L) 12/21/2018   HCT 34.3 (L) 12/21/2018   MCV 96.6 12/21/2018   PLT 145 (L) 12/21/2018    _0 @  Lab Results  Component Value Date   LABCA2 17 04/26/2008    No components found for: [id number]  No results for input(s): INR in the last 168 hours.  Lab Results  Component Value Date   LABCA2 17  04/26/2008    No results found for: ITG549  No results found for: YIF027  No results found for: XAJ287  No results found for: CA2729  No components found for: HGQUANT  No results found for: CEA1 / No results found for: CEA1   No results found for: AFPTUMOR  No results found for: CHROMOGRNA  No results found for: PSA1  Appointment on 12/21/2018  Component Date Value Ref Range Status  . Sodium 12/21/2018 142  135 - 145 mmol/L Final  . Potassium 12/21/2018 4.1  3.5 - 5.1 mmol/L Final  . Chloride 12/21/2018 108  98 - 111 mmol/L Final  . CO2 12/21/2018 25  22 - 32 mmol/L Final  . Glucose, Bld 12/21/2018 111* 70 - 99 mg/dL Final  . BUN 12/21/2018 14  8 - 23 mg/dL Final  . Creatinine, Ser 12/21/2018 0.88  0.44 - 1.00 mg/dL Final  . Calcium 12/21/2018 9.4  8.9 - 10.3 mg/dL Final  . Total Protein 12/21/2018 7.7  6.5 - 8.1 g/dL Final  . Albumin 12/21/2018 3.9  3.5 - 5.0 g/dL Final  . AST 12/21/2018 15  15 - 41 U/L Final  . ALT 12/21/2018 15  0 - 44 U/L Final  . Alkaline Phosphatase 12/21/2018 156* 38 - 126 U/L Final  . Total Bilirubin 12/21/2018 0.5  0.3 - 1.2 mg/dL Final  . GFR calc non Af Amer 12/21/2018 >60  >60 mL/min Final  . GFR calc Af Amer 12/21/2018 >60  >60 mL/min Final  . Anion gap 12/21/2018 9  5 - 15 Final   Performed at [facility], [place] [address] [place], [place] [address]  . WBC 12/21/2018 3.8* 4.0 - 10.5 K/uL Final  . RBC 12/21/2018 3.55* 3.87 - 5.11 MIL/uL Final  . Hemoglobin 12/21/2018 11.3* 12.0 - 15.0 g/dL Final  . HCT 12/21/2018 34.3* 36.0 - 46.0 % Final  . MCV 12/21/2018 96.6  80.0 - 100.0 fL Final  . MCH 12/21/2018 31.8  26.0 - 34.0 pg Final  . MCHC 12/21/2018 32.9  30.0 - 36.0 g/dL Final  . RDW 12/21/2018 12.4  11.5 - 15.5 % Final  . Platelets 12/21/2018 145* 150 - 400 K/uL Final  . nRBC 12/21/2018 0.0  0.0 - 0.2 % Final  . Neutrophils Relative % 12/21/2018 54  % Final  . Neutro Abs 12/21/2018 2.1  1.7 - 7.7 K/uL Final  .  Lymphocytes Relative 12/21/2018 33  % Final  . Lymphs Abs 12/21/2018 1.3  0.7 - 4.0 K/uL Final  . Monocytes Relative 12/21/2018 7  % Final  . Monocytes Absolute 12/21/2018 0.3  0.1 - 1.0 K/uL Final  . Eosinophils Relative 12/21/2018 5  % Final  . Eosinophils Absolute 12/21/2018 0.2  0.0 - 0.5 K/uL Final  . Basophils Relative 12/21/2018 1  % Final  . Basophils Absolute 12/21/2018 0.0  0.0 - 0.1 K/uL Final  . Immature Granulocytes 12/21/2018 0  % Final  . Abs Immature Granulocytes 12/21/2018 0.01  0.00 - 0.07 K/uL Final   Performed at [facility], [place] [address], [place], [place] [address]    (this displays the last labs from the last 3 days)  No results found for: TOTALPROTELP, ALBUMINELP, A1GS, A2GS, BETS, BETA2SER, GAMS, MSPIKE, SPEI (this displays SPEP labs)  No results found for: KPAFRELGTCHN, LAMBDASER, KAPLAMBRATIO (kappa/lambda light chains)  No results found for: HGBA, HGBA2QUANT, HGBFQUANT, HGBSQUAN (Hemoglobinopathy evaluation)   No results found for: LDH  Lab Results  Component Value Date   IRON 61 12/25/2012   TIBC 331  12/25/2012   IRONPCTSAT 18 (L) 12/25/2012   (Iron and TIBC)  Lab Results  Component Value Date   FERRITIN 31 12/25/2012    Urinalysis    Component Value Date/Time   COLORURINE LT YELLOW 04/18/2008 1220   APPEARANCEUR Clear 04/18/2008 1220   LABSPEC > OR = 1.030 04/18/2008 1220   PHURINE 5.5 04/18/2008 1220   GLUCOSEU NEGATIVE 04/18/2008 1220   BILIRUBINUR NEGATIVE 04/18/2008 1220   KETONESUR NEGATIVE 04/18/2008 1220   UROBILINOGEN 0.2 mg/dL 04/18/2008 1220   NITRITE Negative 04/18/2008 1220   LEUKOCYTESUR Negative 04/18/2008 1220    STUDIES: No results found.    ELIGIBLE FOR AVAILABLE RESEARCH PROTOCOL: no   ASSESSMENT: 63 y.o. [place] woman status post right breast lower outer quadrant biopsy 11/11/2016 for a clinical TX N), stage I invasive ductal carcinoma, grade 2, E-cadherin positive, estrogen  receptor 95% positive, progesterone receptor 5% positive, with an MIB-1 of 10%, and no HER-2 amplification  (1) Status post right lumpectomy and right axillary sentinel lymph node sampling 12/16/2016 for a pT1c pN0, stage IA invasive ductal carcinoma, grade 1, with negative margins.  Total of 3 sentinel lymph nodes removed  (2) The Oncotype DX score was 18, predicting a risk of outside the breast recurrence over the next 10 years of 11% if the patient's only systemic therapy is tamoxifen for 5 years.  It also predicts no benefit from chemotherapy.  (3) Adjuvant radiation 02/05/2017-03/05/2017 Site/dose:    1. Right breast, 2.67 Gy in 15 fractions for a total dose of 40.05 Gy                    2. Right breast boost, 2 Gy in 5 fractions for a total dose of 10 Gy   (4) anastrozole started February 2019  (a) not a good tamoxifen candidate given problem #5  (b) bone density at breast center 10/04/2015 with T-score of -0.3 (normal)  (c) bone density 12/28/2018  (5) history of DVT/PE March 2010, with negative extensive hypercoagulable workup, IVC filter in place   PLAN: [name] is now 2 years out from definitive surgery for her breast cancer with no evidence of disease recurrence.  This is very favorable.  She is tolerating anastrozole well, and the plan is to continue anastrozole for a minimum of 5 years.  He is already scheduled for repeat bone density next week.  If there is a significant change we will consider bisphosphonates.  We discussed her grandmother situation.  It is not clear of course how long she will live but my suggestion is that she stay in a wheelchair and only transfer from bed to wheelchair and back and wheelchair to toilet on back.  Hopefully if she does not she will prevent further falls.  I will see [name] again in a year.  She knows to call for any other issues that may develop before the next visit here.  [name], Virgie Dad, MD  12/21/18 1:25 PM Medical Oncology and  Hematology [facility] [address] [place], [place] [address] Tel. [phone]    Fax. [phone]    I, Wilburn Mylar, am acting as scribe for Dr. Virgie Dad. Bretton Tandy.  I, Lurline Del MD, have reviewed the above documentation for accuracy and completeness, and I agree with the above.

## 2018-12-21 ENCOUNTER — Inpatient Hospital Stay: Payer: Federal, State, Local not specified - PPO | Attending: Oncology

## 2018-12-21 ENCOUNTER — Inpatient Hospital Stay: Payer: Federal, State, Local not specified - PPO | Admitting: Oncology

## 2018-12-21 ENCOUNTER — Other Ambulatory Visit: Payer: Self-pay

## 2018-12-21 VITALS — BP 141/79 | HR 55 | Temp 97.9°F | Resp 18 | Ht 66.5 in | Wt 183.4 lb

## 2018-12-21 DIAGNOSIS — I10 Essential (primary) hypertension: Secondary | ICD-10-CM | POA: Diagnosis not present

## 2018-12-21 DIAGNOSIS — Z170 Estrogen receptor positive status [ER+]: Secondary | ICD-10-CM | POA: Insufficient documentation

## 2018-12-21 DIAGNOSIS — Z86718 Personal history of other venous thrombosis and embolism: Secondary | ICD-10-CM | POA: Insufficient documentation

## 2018-12-21 DIAGNOSIS — E0500 Thyrotoxicosis with diffuse goiter without thyrotoxic crisis or storm: Secondary | ICD-10-CM | POA: Insufficient documentation

## 2018-12-21 DIAGNOSIS — Z86711 Personal history of pulmonary embolism: Secondary | ICD-10-CM | POA: Insufficient documentation

## 2018-12-21 DIAGNOSIS — I2699 Other pulmonary embolism without acute cor pulmonale: Secondary | ICD-10-CM

## 2018-12-21 DIAGNOSIS — Z79899 Other long term (current) drug therapy: Secondary | ICD-10-CM | POA: Insufficient documentation

## 2018-12-21 DIAGNOSIS — C50511 Malignant neoplasm of lower-outer quadrant of right female breast: Secondary | ICD-10-CM

## 2018-12-21 DIAGNOSIS — Z7982 Long term (current) use of aspirin: Secondary | ICD-10-CM | POA: Insufficient documentation

## 2018-12-21 DIAGNOSIS — Z87891 Personal history of nicotine dependence: Secondary | ICD-10-CM | POA: Diagnosis not present

## 2018-12-21 DIAGNOSIS — Z79811 Long term (current) use of aromatase inhibitors: Secondary | ICD-10-CM | POA: Diagnosis not present

## 2018-12-21 DIAGNOSIS — Z7901 Long term (current) use of anticoagulants: Secondary | ICD-10-CM | POA: Insufficient documentation

## 2018-12-21 DIAGNOSIS — Z8249 Family history of ischemic heart disease and other diseases of the circulatory system: Secondary | ICD-10-CM | POA: Diagnosis not present

## 2018-12-21 DIAGNOSIS — D508 Other iron deficiency anemias: Secondary | ICD-10-CM

## 2018-12-21 DIAGNOSIS — E785 Hyperlipidemia, unspecified: Secondary | ICD-10-CM | POA: Insufficient documentation

## 2018-12-21 DIAGNOSIS — Z800 Family history of malignant neoplasm of digestive organs: Secondary | ICD-10-CM | POA: Diagnosis not present

## 2018-12-21 DIAGNOSIS — Z923 Personal history of irradiation: Secondary | ICD-10-CM | POA: Insufficient documentation

## 2018-12-21 LAB — COMPREHENSIVE METABOLIC PANEL
ALT: 15 U/L (ref 0–44)
AST: 15 U/L (ref 15–41)
Albumin: 3.9 g/dL (ref 3.5–5.0)
Alkaline Phosphatase: 156 U/L — ABNORMAL HIGH (ref 38–126)
Anion gap: 9 (ref 5–15)
BUN: 14 mg/dL (ref 8–23)
CO2: 25 mmol/L (ref 22–32)
Calcium: 9.4 mg/dL (ref 8.9–10.3)
Chloride: 108 mmol/L (ref 98–111)
Creatinine, Ser: 0.88 mg/dL (ref 0.44–1.00)
GFR calc Af Amer: >60 mL/min (ref >60)
GFR calc non Af Amer: >60 mL/min (ref >60)
Glucose, Bld: 111 mg/dL — ABNORMAL HIGH (ref 70–99)
Potassium: 4.1 mmol/L (ref 3.5–5.1)
Sodium: 142 mmol/L (ref 135–145)
Total Bilirubin: 0.5 mg/dL (ref 0.3–1.2)
Total Protein: 7.7 g/dL (ref 6.5–8.1)

## 2018-12-21 LAB — CBC WITH DIFFERENTIAL/PLATELET
Abs Immature Granulocytes: 0.01 10*3/uL (ref 0.00–0.07)
Basophils Absolute: 0 10*3/uL (ref 0.0–0.1)
Basophils Relative: 1 %
Eosinophils Absolute: 0.2 10*3/uL (ref 0.0–0.5)
Eosinophils Relative: 5 %
HCT: 34.3 % — ABNORMAL LOW (ref 36.0–46.0)
Hemoglobin: 11.3 g/dL — ABNORMAL LOW (ref 12.0–15.0)
Immature Granulocytes: 0 %
Lymphocytes Relative: 33 %
Lymphs Abs: 1.3 10*3/uL (ref 0.7–4.0)
MCH: 31.8 pg (ref 26.0–34.0)
MCHC: 32.9 g/dL (ref 30.0–36.0)
MCV: 96.6 fL (ref 80.0–100.0)
Monocytes Absolute: 0.3 10*3/uL (ref 0.1–1.0)
Monocytes Relative: 7 %
Neutro Abs: 2.1 10*3/uL (ref 1.7–7.7)
Neutrophils Relative %: 54 %
Platelets: 145 10*3/uL — ABNORMAL LOW (ref 150–400)
RBC: 3.55 MIL/uL — ABNORMAL LOW (ref 3.87–5.11)
RDW: 12.4 % (ref 11.5–15.5)
WBC: 3.8 10*3/uL — ABNORMAL LOW (ref 4.0–10.5)
nRBC: 0 % (ref 0.0–0.2)

## 2018-12-21 MED ORDER — ANASTROZOLE 1 MG PO TABS: 1.0000 mg | ORAL_TABLET | Freq: Every day | ORAL | 4 refills | Status: DC

## 2018-12-22 ENCOUNTER — Telehealth: Payer: Self-pay | Admitting: Oncology

## 2018-12-22 NOTE — Telephone Encounter (Signed)
I left a message regarding schedule  

## 2018-12-28 ENCOUNTER — Other Ambulatory Visit: Payer: Self-pay

## 2018-12-28 ENCOUNTER — Ambulatory Visit
Admission: RE | Admit: 2018-12-28 | Discharge: 2018-12-28 | Disposition: A | Discharge status: Home / Self Care | Payer: Federal, State, Local not specified - PPO | Source: Ambulatory Visit | Attending: Oncology | Admitting: Oncology

## 2018-12-28 DIAGNOSIS — C50511 Malignant neoplasm of lower-outer quadrant of right female breast: Secondary | ICD-10-CM

## 2018-12-28 DIAGNOSIS — Z13820 Encounter for screening for osteoporosis: Secondary | ICD-10-CM | POA: Diagnosis not present

## 2018-12-28 DIAGNOSIS — Z780 Asymptomatic menopausal state: Secondary | ICD-10-CM | POA: Diagnosis not present

## 2019-01-01 DIAGNOSIS — Z0000 Encounter for general adult medical examination without abnormal findings: Secondary | ICD-10-CM | POA: Diagnosis not present

## 2019-01-01 DIAGNOSIS — Z283 Underimmunization status: Secondary | ICD-10-CM | POA: Diagnosis not present

## 2019-02-03 ENCOUNTER — Other Ambulatory Visit: Payer: Self-pay | Admitting: Oncology

## 2019-02-19 ENCOUNTER — Ambulatory Visit: Payer: Federal, State, Local not specified - PPO | Admitting: Cardiology

## 2019-03-04 ENCOUNTER — Telehealth: Payer: Self-pay | Admitting: *Deleted

## 2019-03-04 NOTE — Telephone Encounter (Signed)
A message was left, re: her follow up visit. 

## 2019-03-18 ENCOUNTER — Emergency Department (HOSPITAL_COMMUNITY)
Admission: EM | Admit: 2019-03-18 | Discharge: 2019-03-18 | Disposition: A | Discharge status: Home / Self Care | Payer: Federal, State, Local not specified - PPO | Attending: Emergency Medicine | Admitting: Emergency Medicine

## 2019-03-18 ENCOUNTER — Other Ambulatory Visit: Payer: Self-pay

## 2019-03-18 ENCOUNTER — Emergency Department (HOSPITAL_COMMUNITY): Payer: Federal, State, Local not specified - PPO

## 2019-03-18 ENCOUNTER — Encounter (HOSPITAL_COMMUNITY): Payer: Self-pay

## 2019-03-18 DIAGNOSIS — Z853 Personal history of malignant neoplasm of breast: Secondary | ICD-10-CM | POA: Diagnosis not present

## 2019-03-18 DIAGNOSIS — Z79899 Other long term (current) drug therapy: Secondary | ICD-10-CM | POA: Diagnosis not present

## 2019-03-18 DIAGNOSIS — R079 Chest pain, unspecified: Secondary | ICD-10-CM | POA: Diagnosis not present

## 2019-03-18 DIAGNOSIS — I10 Essential (primary) hypertension: Secondary | ICD-10-CM | POA: Insufficient documentation

## 2019-03-18 DIAGNOSIS — Z87891 Personal history of nicotine dependence: Secondary | ICD-10-CM | POA: Insufficient documentation

## 2019-03-18 DIAGNOSIS — R0789 Other chest pain: Secondary | ICD-10-CM | POA: Diagnosis not present

## 2019-03-18 DIAGNOSIS — Z7982 Long term (current) use of aspirin: Secondary | ICD-10-CM | POA: Insufficient documentation

## 2019-03-18 LAB — TROPONIN I (HIGH SENSITIVITY)
Troponin I (High Sensitivity): <2 ng/L (ref <18)
Troponin I (High Sensitivity): <2 ng/L (ref <18)

## 2019-03-18 LAB — BASIC METABOLIC PANEL
Anion gap: 11 (ref 5–15)
BUN: 11 mg/dL (ref 8–23)
CO2: 24 mmol/L (ref 22–32)
Calcium: 9.7 mg/dL (ref 8.9–10.3)
Chloride: 106 mmol/L (ref 98–111)
Creatinine, Ser: 0.78 mg/dL (ref 0.44–1.00)
GFR calc Af Amer: >60 mL/min (ref >60)
GFR calc non Af Amer: >60 mL/min (ref >60)
Glucose, Bld: 100 mg/dL — ABNORMAL HIGH (ref 70–99)
Potassium: 4.2 mmol/L (ref 3.5–5.1)
Sodium: 141 mmol/L (ref 135–145)

## 2019-03-18 LAB — CBC
HCT: 38.7 % (ref 36.0–46.0)
Hemoglobin: 12.5 g/dL (ref 12.0–15.0)
MCH: 32.1 pg (ref 26.0–34.0)
MCHC: 32.3 g/dL (ref 30.0–36.0)
MCV: 99.5 fL (ref 80.0–100.0)
Platelets: 157 10*3/uL (ref 150–400)
RBC: 3.89 MIL/uL (ref 3.87–5.11)
RDW: 12.6 % (ref 11.5–15.5)
WBC: 3.7 10*3/uL — ABNORMAL LOW (ref 4.0–10.5)
nRBC: 0 % (ref 0.0–0.2)

## 2019-03-18 MED ORDER — IOHEXOL 350 MG/ML SOLN
100.0000 mL | Freq: Once | INTRAVENOUS | Status: AC | PRN
  Administered 2019-03-18: 100 mL via INTRAVENOUS

## 2019-03-18 NOTE — ED Notes (Signed)
Transported to CT 

## 2019-03-18 NOTE — ED Provider Notes (Signed)
[facility] Provider Note   CSN: [id number] Arrival date & time: 03/18/19  1550     History Chief Complaint  Patient presents with  . Chest Pain    [name] is a 64 y.o. female.  64yo F w/ PMH including DVT/PE, HTN, HLD, breast cancer, Graves disease who p/w chest pain. She reports intermittent L upper chest pain near her shoulder that has been coming and going randomly for the past 2 weeks. Pain is pressure, sometimes dull, not associated w/ exertion, and lasts a few seconds in duration. Nothing makes it better or worse. No SOB, N/V, or diaphoresis. No fever, cough/cold symptoms, leg swelling, or estrogen use.   No FH heart disease. She has distant h/o blood clots in setting of taking estrogen therapy and later in setting of active cancer.   The history is provided by the patient.  Chest Pain      Past Medical History:  Diagnosis Date  . Anxiety   . Arthritis    neck  . Breast cancer ([place]) 2018   Right Breast Cancer  . DVT (deep venous thrombosis) ([place])    04/2010  . GERD (gastroesophageal reflux disease)   . Headache(784.0) 09/22/2012  . Heart murmur    dx in the 90s - said that it comes and goes - cardiologist said it was a mild murmur  . History of hiatal hernia   . History of radiation therapy 02/05/2017   02/05/17-03/05/17,  right breast 40.05 Gy in 15 fractions, right breast boost 10 Gy in 5 fractions  . Hyperlipidemia   . Hypertension   . Hyperthyroidism   . Iron deficiency anemia   . Palpitations   . Pulmonary embolism ([place]) 2010  . Uterine fibroid   . Vitamin D deficiency    Graves disease    Patient Active Problem List   Diagnosis Date Noted  . Dyslipidemia 11/08/2018  . History of DVT (deep vein thrombosis) 02/16/2018  . History of breast cancer 02/16/2018  . Pulmonary emboli ([place]) 11/20/2016  . Malignant neoplasm of lower-outer quadrant of right breast of female, estrogen receptor positive ([place])  11/19/2016  . Graves disease 10/19/2014  . Palpitation 09/24/2013  . Headache(784.0) 09/22/2012  . Dysarthria 09/22/2012  . LEG PAIN, RIGHT 04/22/2008  . VITAMIN B12 DEFICIENCY 04/19/2008  . ANEMIA, IRON DEFICIENCY 04/19/2008  . HYPERLIPIDEMIA 03/30/2008  . HYPERTENSION 03/30/2008  . ALLERGIC RHINITIS 03/30/2008  . GERD 03/30/2008    Past Surgical History:  Procedure Laterality Date  . BREAST LUMPECTOMY Right 12/16/2016  . BREAST LUMPECTOMY WITH RADIOACTIVE SEED AND SENTINEL LYMPH NODE BIOPSY Right 12/16/2016   Procedure: RIGHT BREAST LUMPECTOMY WITH RADIOACTIVE SEED AND SENTINEL LYMPH NODE BIOPSY;  Surgeon: Rolm Bookbinder, MD;  Location: [facility];  Service: General;  Laterality: Right;  . CHOLECYSTECTOMY    . COLONOSCOPY    . DILATION AND CURETTAGE OF UTERUS     01/2008  . ESOPHAGOGASTRODUODENOSCOPY    . ivc filter       OB History   No obstetric history on file.     Family History  Problem Relation Age of Onset  . Drug abuse Brother   . Hypertension Mother   . Colon cancer Maternal Grandfather     Social History   Tobacco Use  . Smoking status: Former Smoker    Packs/day: 0.40    Years: 10.00    Pack years: 4.00    Types: Cigarettes    Quit date: 06/01/2002  Years since quitting: 16.8  . Smokeless tobacco: Never Used  Substance Use Topics  . Alcohol use: Yes    Comment: occasional 1 drink per month  . Drug use: No    Home Medications Prior to Admission medications   Medication Sig Start Date End Date Taking? Authorizing Provider  anastrozole (ARIMIDEX) 1 MG tablet TAKE 1 TABLET BY MOUTH EVERY DAY 02/03/19   Magrinat, Virgie Dad, MD  Ascorbic Acid (VITAMIN C) 1000 MG tablet Take 1,000 mg by mouth daily.    [provider]  aspirin EC 81 MG tablet Take 1 tablet (81 mg total) by mouth daily. 10/07/17   Minus Breeding, MD  candesartan (ATACAND) 4 MG tablet  04/08/18   [provider]  Cholecalciferol (VITAMIN D-3 PO) Take 1 capsule by mouth  daily.    [provider]  methimazole (TAPAZOLE) 5 MG tablet Take 5 mg by mouth daily. 07/10/18   [provider]  Omega-3 Fatty Acids (FISH OIL) 1200 MG CAPS Take 1 capsule by mouth daily.    [provider]  Polyethyl Glycol-Propyl Glycol (SYSTANE OP) Place 1 drop into both eyes daily as needed (dry eyes).     [provider]    Allergies    Amoxicillin-pot clavulanate and Niacin-lovastatin er  Review of Systems   Review of Systems  Cardiovascular: Positive for chest pain.  All other systems reviewed and are negative except that which was mentioned in HPI   Physical Exam Updated Vital Signs BP 130/77   Pulse (!) 55   Temp 98 F (36.7 C) (Oral)   Resp 13   Ht 5\' 7"  (1.702 m)   Wt 83.9 kg   LMP 06/18/2010   SpO2 99%   BMI 28.98 kg/m   Physical Exam Vitals and nursing note reviewed.  Constitutional:      General: She is not in acute distress.    Appearance: She is well-developed.  HENT:     Head: Normocephalic and atraumatic.  Eyes:     Conjunctiva/sclera: Conjunctivae normal.  Cardiovascular:     Rate and Rhythm: Normal rate and regular rhythm.     Heart sounds: Normal heart sounds. No murmur.  Pulmonary:     Effort: Pulmonary effort is normal.     Breath sounds: Normal breath sounds.  Chest:     Chest wall: No tenderness.  Abdominal:     General: There is no distension.     Palpations: Abdomen is soft.     Tenderness: There is no abdominal tenderness.  Musculoskeletal:     Cervical back: Neck supple.     Right lower leg: No tenderness. No edema.     Left lower leg: No tenderness. No edema.  Skin:    General: Skin is warm and dry.  Neurological:     Mental Status: She is alert and oriented to person, place, and time.     Comments: Fluent speech  Psychiatric:        Judgment: Judgment normal.     ED Results / Procedures / Treatments   Labs (all labs ordered are listed, but only abnormal results are displayed) Labs  Reviewed  BASIC METABOLIC PANEL - Abnormal; Notable for the following components:      Result Value   Glucose, Bld 100 (*)    All other components within normal limits  CBC - Abnormal; Notable for the following components:   WBC 3.7 (*)    All other components within normal limits  TROPONIN I (  HIGH SENSITIVITY)  TROPONIN I (HIGH SENSITIVITY)    EKG None Sinus rhythm, rate 67, QTc 412, no ST segment or T wave changes to suggest acute ischemia  Radiology DG Chest 2 View  Result Date: 03/18/2019 CLINICAL DATA:  Chest pain EXAM: CHEST - 2 VIEW COMPARISON:  December 15, 2017 FINDINGS: The heart size and mediastinal contours are within normal limits. Both lungs are clear. Surgical clips overlying the right chest wall. The visualized skeletal structures are unremarkable. IMPRESSION: No active cardiopulmonary disease. Electronically Signed   By: Prudencio Pair M.D.   On: 03/18/2019 16:48   CT Angio Chest PE W/Cm &/Or Wo Cm  Result Date: 03/18/2019 CLINICAL DATA:  64 year old female with left-sided chest pain. History of pulmonary embolism and DVT. EXAM: CT ANGIOGRAPHY CHEST WITH CONTRAST TECHNIQUE: Multidetector CT imaging of the chest was performed using the standard protocol during bolus administration of intravenous contrast. Multiplanar CT image reconstructions and MIPs were obtained to evaluate the vascular anatomy. CONTRAST:  139mL OMNIPAQUE IOHEXOL 350 MG/ML SOLN COMPARISON:  Chest radiograph dated 03/18/2019. FINDINGS: Cardiovascular: There is mild cardiomegaly. No pericardial effusion. There is retrograde flow of contrast from the right atrium into the IVC in keeping with a degree of right heart dysfunction. There is mild atherosclerotic calcification of the aortic arch. No aneurysmal dilatation or evidence of dissection. No pulmonary artery embolus identified. Mediastinum/Nodes: There is no hilar or mediastinal adenopathy. There is a small hiatal hernia. The esophagus is grossly unremarkable. No  mediastinal fluid collection. Lungs/Pleura: The lungs are clear. There is no pleural effusion or pneumothorax. The central airways are patent. Upper Abdomen: Cholecystectomy. Small nonobstructing right renal upper pole calculus may be present. Musculoskeletal: A 1 cm nodular density in the right breast with associated surgical clips. Clinical correlation is recommended. No fluid collection. No acute osseous pathology. Review of the MIP images confirms the above findings. IMPRESSION: 1. No acute intrathoracic pathology. No CT evidence of pulmonary embolism. 2. Mild cardiomegaly and mild Aortic Atherosclerosis (ICD10-I70.0). Electronically Signed   By: Anner Crete M.D.   On: 03/18/2019 19:42    Procedures Procedures (including critical care time)  Medications Ordered in ED Medications  iohexol (OMNIPAQUE) 350 MG/ML injection 100 mL (100 mLs Intravenous Contrast Given 03/18/19 1921)    ED Course  I have reviewed the triage vital signs and the nursing notes.  Pertinent labs & imaging results that were available during my care of the patient were reviewed by me and considered in my medical decision making (see chart for details).    MDM Rules/Calculators/A&P                      Comfortable on exam, reassuring vital signs, unremarkable EKG showing sinus rhythm with no ischemic changes.  Because of the patient's history of DVT/PE as well as breast cancer, obtain CTA to rule out PE. CTA negative for PE, no acute findings.   Serial troponins are negative.  Symptoms have been nonexertional, intermittent, a few seconds in duration, and not associated with any other symptoms therefore I highly doubt ACS.  I have recommended PCP follow-up and extensively reviewed return precautions.  Patient voiced understanding. Final Clinical Impression(s) / ED Diagnoses Final diagnoses:  Atypical chest pain    Rx / DC Orders ED Discharge Orders    None       Dixie Jafri, Wenda Overland, MD 03/18/19 2028

## 2019-03-18 NOTE — ED Notes (Signed)
Discharge instructions discussed with pt. Pt verbalized understanding with no questions at this time. Pt to follow up with PCP.  

## 2019-03-18 NOTE — ED Triage Notes (Signed)
Pt sent from UC for further evaluation of left sided chest pain, intermittent pressure for 1 week. No other associated symptoms. Pt has hx of blood clot in 2010 but is not on any blood thinners. Pain 3/10 at this time. Pt a.o, nad noted

## 2019-03-21 NOTE — Progress Notes (Deleted)
Cardiology Office Note   Date:  03/21/2019   ID:  Rotonda, Stankovic Oct 01, 1955, MRN [id number]  PCP:  Shanon Rosser, PA-C  Cardiologist:  Dr. Percival Spanish  No chief complaint on file.    History of Present Illness: [name] is a 64 y.o. female who presents for ongoing assessment and management with history of DVT and pulmonary embolism in 2010.  She had a negative hypercoagulable work-up.  IVC filter was placed at that time.  Other history includes breast cancer with lumpectomy and radiation, hypertension, hyperlipidemia, and Graves' disease..  The patient had apparent recurrent DVT and is Xarelto was added.  On follow-up with Dr. Percival Spanish 06/2017 she remained on Xarelto.  She followed up with Dr. Doren Custard in October 2019, with IVC filter evaluation.  A CT scan of the chest on 2017 showed that the filter was in good position although there were to stretch that appeared to penetrate the inferior vena cava.  This did not appear to be causing any significant problems and therefore the filter was kept in place and not removed.  Xarelto was stopped in August 2019.  Follow-up Doppler studies for recurrent leg pain was found to be negative for DVT on 02/06/2018.  She was last seen by Laura Ransom, PA, on 02/16/2018 and was doing well and was without complaint.  [name] was seen in the ED on 03/18/2019 with complaints of chest pain.  EKG and labs were negative for ACS.  EKG revealed normal sinus rhythm with no ischemic changes.  CTA ruled out PE.  She was released home to follow-up with PCP and cardiology.   Past Medical History:  Diagnosis Date  . Anxiety   . Arthritis    neck  . Breast cancer ([place]) 2018   Right Breast Cancer  . DVT (deep venous thrombosis) ([place])    04/2010  . GERD (gastroesophageal reflux disease)   . Headache(784.0) 09/22/2012  . Heart murmur    dx in the 90s - said that it comes and goes - cardiologist said it was a mild murmur  . History of hiatal hernia   . History of  radiation therapy 02/05/2017   02/05/17-03/05/17,  right breast 40.05 Gy in 15 fractions, right breast boost 10 Gy in 5 fractions  . Hyperlipidemia   . Hypertension   . Hyperthyroidism   . Iron deficiency anemia   . Palpitations   . Pulmonary embolism ([place]) 2010  . Uterine fibroid   . Vitamin D deficiency    Graves disease    Past Surgical History:  Procedure Laterality Date  . BREAST LUMPECTOMY Right 12/16/2016  . BREAST LUMPECTOMY WITH RADIOACTIVE SEED AND SENTINEL LYMPH NODE BIOPSY Right 12/16/2016   Procedure: RIGHT BREAST LUMPECTOMY WITH RADIOACTIVE SEED AND SENTINEL LYMPH NODE BIOPSY;  Surgeon: Rolm Bookbinder, MD;  Location: [facility];  Service: General;  Laterality: Right;  . CHOLECYSTECTOMY    . COLONOSCOPY    . DILATION AND CURETTAGE OF UTERUS     01/2008  . ESOPHAGOGASTRODUODENOSCOPY    . ivc filter       Current Outpatient Medications  Medication Sig Dispense Refill  . anastrozole (ARIMIDEX) 1 MG tablet TAKE 1 TABLET BY MOUTH EVERY DAY 90 tablet 4  . Ascorbic Acid (VITAMIN C) 1000 MG tablet Take 1,000 mg by mouth daily.    Marland Kitchen aspirin EC 81 MG tablet Take 1 tablet (81 mg total) by mouth daily. 90 tablet 3  . candesartan (ATACAND) 4 MG tablet     .  Cholecalciferol (VITAMIN D-3 PO) Take 1 capsule by mouth daily.    . methimazole (TAPAZOLE) 5 MG tablet Take 5 mg by mouth daily.    . Omega-3 Fatty Acids (FISH OIL) 1200 MG CAPS Take 1 capsule by mouth daily.    [name] Glycol-Propyl Glycol (SYSTANE OP) Place 1 drop into both eyes daily as needed (dry eyes).      No current facility-administered medications for this visit.    Allergies:   Amoxicillin-pot clavulanate and Niacin-lovastatin er    Social History:  The patient  reports that she quit smoking about 16 years ago. Her smoking use included cigarettes. She has a 4.00 pack-year smoking history. She has never used smokeless tobacco. She reports current alcohol use. She reports that she does not use drugs.    Family History:  The patient's family history includes Colon cancer in her maternal grandfather; Drug abuse in her brother; Hypertension in her mother.    ROS: All other systems are reviewed and negative. Unless otherwise mentioned in H&P    PHYSICAL EXAM: VS:  LMP 06/18/2010  , BMI There is no height or weight on file to calculate BMI. GEN: Well nourished, well developed, in no acute distress HEENT: normal Neck: no JVD, carotid bruits, or masses Cardiac: ***RRR; no murmurs, rubs, or gallops,no edema  Respiratory:  Clear to auscultation bilaterally, normal work of breathing GI: soft, nontender, nondistended, + BS MS: no deformity or atrophy Skin: warm and dry, no rash Neuro:  Strength and sensation are intact Psych: euthymic mood, full affect   EKG:  EKG {ACTION; IS/IS [id number] ordered today. The ekg ordered today demonstrates ***   Recent Labs: 12/21/2018: ALT 15 03/18/2019: BUN 11; Creatinine, Ser 0.78; Hemoglobin 12.5; Platelets 157; Potassium 4.2; Sodium 141    Lipid Panel    Component Value Date/Time   CHOL 152 03/21/2017 1141   TRIG 63 03/21/2017 1141   HDL 49 03/21/2017 1141   CHOLHDL 3.1 03/21/2017 1141   CHOLHDL 4.4 04/06/2010 1037   VLDL 16 04/06/2010 1037   LDLCALC 90 03/21/2017 1141      Wt Readings from Last 3 Encounters:  03/18/19 185 lb (83.9 kg)  12/21/18 183 lb 6.4 oz (83.2 kg)  04/22/18 194 lb (88 kg)      Other studies Reviewed: Additional studies/ records that were reviewed today include: ***. Review of the above records demonstrates: ***   ASSESSMENT AND PLAN:  1.  ***   Current medicines are reviewed at length with the patient today.    Labs/ tests ordered today include: *** Phill Myron. [place] Pugh, ANP, AACC   03/21/2019 2:40 PM    [facility] [address] Office [phone] Fax [phone]  Notice: This dictation was prepared with Dragon dictation along with smaller phrase  technology. Any transcriptional errors that result from this process are unintentional and may not be corrected upon review.

## 2019-03-22 ENCOUNTER — Ambulatory Visit: Payer: Federal, State, Local not specified - PPO | Admitting: Adult Health

## 2019-03-30 ENCOUNTER — Telehealth: Payer: Self-pay | Admitting: *Deleted

## 2019-03-30 NOTE — Telephone Encounter (Signed)
A message was left, re: her follow up visit. 

## 2019-05-07 DIAGNOSIS — R739 Hyperglycemia, unspecified: Secondary | ICD-10-CM | POA: Diagnosis not present

## 2019-05-07 DIAGNOSIS — R7309 Other abnormal glucose: Secondary | ICD-10-CM | POA: Diagnosis not present

## 2019-05-07 DIAGNOSIS — R300 Dysuria: Secondary | ICD-10-CM | POA: Diagnosis not present

## 2019-05-07 DIAGNOSIS — R309 Painful micturition, unspecified: Secondary | ICD-10-CM | POA: Diagnosis not present

## 2019-05-08 DIAGNOSIS — R309 Painful micturition, unspecified: Secondary | ICD-10-CM | POA: Diagnosis not present

## 2019-05-08 DIAGNOSIS — R300 Dysuria: Secondary | ICD-10-CM | POA: Diagnosis not present

## 2019-05-26 ENCOUNTER — Telehealth: Payer: Self-pay

## 2019-05-26 ENCOUNTER — Telehealth: Payer: Self-pay | Admitting: Cardiology

## 2019-05-26 NOTE — Telephone Encounter (Signed)
Pt called to report that she received Wynetta Emery and [employer] vaccine on 4/12.    Pt voiced concerned with history of blood clots, and blood clots related to [employer] vaccine.  Pt inquiring to see if MD recommends re-starting anticoagulation medication.   RN reviewed with MD.  MD does not recommend restarting medications at this time.  Pt voiced understanding.  No further needs at this time.

## 2019-05-26 NOTE — Telephone Encounter (Signed)
No.  There are no data to support doing that.  The risk of clotting is really rare.

## 2019-05-26 NOTE — Telephone Encounter (Signed)
  Patient is concerned because she had the [employer] vaccine on  05/24/19 and she has a history of blood clots. She wants to know if Dr Percival Spanish would recommend her going back on a blood thinner for a couple months just in case. Please advise.

## 2019-05-26 NOTE — Telephone Encounter (Signed)
Spoke with patient. Informed her of Dr. Rosezella [place] recommendations not to start any new medications as there is no data to support doing so and risk of clotting is really rare. Patient verbalized understanding.

## 2019-05-26 NOTE — Telephone Encounter (Signed)
Routing to MD to advise on this issue since the news has come out regarding the Wynetta Emery and [employer] vaccine.   Thank you!

## 2019-05-31 ENCOUNTER — Telehealth: Payer: Self-pay | Admitting: *Deleted

## 2019-05-31 NOTE — Telephone Encounter (Signed)
Patient called and requested to be seen stating she had previously had a DVT, right leg.  She denies warmth, redness or swelling.  C/O of numbness and pain.  Appointment was scheduled for DVT duplex and an office visit.

## 2019-06-09 ENCOUNTER — Other Ambulatory Visit: Payer: Self-pay | Admitting: *Deleted

## 2019-06-09 DIAGNOSIS — Z86718 Personal history of other venous thrombosis and embolism: Secondary | ICD-10-CM

## 2019-06-09 DIAGNOSIS — M79604 Pain in right leg: Secondary | ICD-10-CM

## 2019-06-11 ENCOUNTER — Ambulatory Visit: Payer: Federal, State, Local not specified - PPO

## 2019-06-11 ENCOUNTER — Encounter (HOSPITAL_COMMUNITY): Payer: Federal, State, Local not specified - PPO

## 2019-06-11 NOTE — Progress Notes (Deleted)
[facility]   Reason for referral: Swollen right leg  History of Present Illness  [name] is a 64 y.o. female who presents with chief complaint: swollen leg.   This patient had a right lower extremity DVT in 2010.  She had a pulmonary embolus despite being on Coumadin and this prompted placement of an IVC filter by interventional radiology on 05/09/2008.  CT scan in 2017 showed that 2 of the struts appear to be penetrating the wall of the inferior vena cava.        Past Medical History:  Diagnosis Date  . Anxiety   . Arthritis    neck  . Breast cancer ([place]) 2018   Right Breast Cancer  . DVT (deep venous thrombosis) ([place])    04/2010  . GERD (gastroesophageal reflux disease)   . Headache(784.0) 09/22/2012  . Heart murmur    dx in the 90s - said that it comes and goes - cardiologist said it was a mild murmur  . History of hiatal hernia   . History of radiation therapy 02/05/2017   02/05/17-03/05/17,  right breast 40.05 Gy in 15 fractions, right breast boost 10 Gy in 5 fractions  . Hyperlipidemia   . Hypertension   . Hyperthyroidism   . Iron deficiency anemia   . Palpitations   . Pulmonary embolism ([place]) 2010  . Uterine fibroid   . Vitamin D deficiency    Graves disease    Past Surgical History:  Procedure Laterality Date  . BREAST LUMPECTOMY Right 12/16/2016  . BREAST LUMPECTOMY WITH RADIOACTIVE SEED AND SENTINEL LYMPH NODE BIOPSY Right 12/16/2016   Procedure: RIGHT BREAST LUMPECTOMY WITH RADIOACTIVE SEED AND SENTINEL LYMPH NODE BIOPSY;  Surgeon: Rolm Bookbinder, MD;  Location: [facility];  Service: General;  Laterality: Right;  . CHOLECYSTECTOMY    . COLONOSCOPY    . DILATION AND CURETTAGE OF UTERUS     01/2008  . ESOPHAGOGASTRODUODENOSCOPY    . ivc filter      Social History   Socioeconomic History  . Marital status: Single    Spouse name: Not on file  . Number of children: Not on file  . Years of education: college  . Highest  education level: Not on file  Occupational History  . Occupation: TAX Firefighter: [employer]  Tobacco Use  . Smoking status: Former Smoker    Packs/day: 0.40    Years: 10.00    Pack years: 4.00    Types: Cigarettes    Quit date: 06/01/2002    Years since quitting: 17.0  . Smokeless tobacco: Never Used  Substance and Sexual Activity  . Alcohol use: Yes    Comment: occasional 1 drink per month  . Drug use: No  . Sexual activity: Not on file  Other Topics Concern  . Not on file  Social History Narrative   Lives alone.    Social Determinants of Health   Financial Resource Strain:   . Difficulty of Paying Living Expenses:   Food Insecurity:   . Worried About Charity fundraiser in the Last Year:   . Arboriculturist in the Last Year:   Transportation Needs:   . Film/video editor (Medical):   Marland Kitchen Lack of Transportation (Non-Medical):   Physical Activity:   . Days of Exercise per Week:   . Minutes of Exercise per Session:   Stress:   . Feeling of Stress :   Social Connections:   .  Frequency of Communication with Friends and Family:   . Frequency of Social Gatherings with Friends and Family:   . Attends Religious Services:   . Active Member of Clubs or Organizations:   . Attends Archivist Meetings:   Marland Kitchen Marital Status:   Intimate Partner Violence:   . Fear of Current or Ex-Partner:   . Emotionally Abused:   Marland Kitchen Physically Abused:   . Sexually Abused:     Family History  Problem Relation Age of Onset  . Drug abuse Brother   . Hypertension Mother   . Colon cancer Maternal Grandfather     Current Outpatient Medications on File Prior to Visit  Medication Sig Dispense Refill  . anastrozole (ARIMIDEX) 1 MG tablet TAKE 1 TABLET BY MOUTH EVERY DAY 90 tablet 4  . Ascorbic Acid (VITAMIN C) 1000 MG tablet Take 1,000 mg by mouth daily.    Marland Kitchen aspirin EC 81 MG tablet Take 1 tablet (81 mg total) by mouth daily. 90 tablet 3  . candesartan (ATACAND) 4 MG  tablet     . Cholecalciferol (VITAMIN D-3 PO) Take 1 capsule by mouth daily.    . methimazole (TAPAZOLE) 5 MG tablet Take 5 mg by mouth daily.    . Omega-3 Fatty Acids (FISH OIL) 1200 MG CAPS Take 1 capsule by mouth daily.    Vladimir Faster Glycol-Propyl Glycol (SYSTANE OP) Place 1 drop into both eyes daily as needed (dry eyes).      No current facility-administered medications on file prior to visit.    Allergies as of 06/11/2019 - Review Complete 03/18/2019  Allergen Reaction Noted  . Amoxicillin-pot clavulanate Other (See Comments) 03/30/2008  . Niacin-lovastatin er Other (See Comments) 03/30/2008     ROS:   General:  No weight loss, Fever, chills  HEENT: No recent headaches, no nasal bleeding, no visual changes, no sore throat  Neurologic: No dizziness, blackouts, seizures. No recent symptoms of stroke or mini- stroke. No recent episodes of slurred speech, or temporary blindness.  Cardiac: No recent episodes of chest pain/pressure, no shortness of breath at rest.  No shortness of breath with exertion.  Denies history of atrial fibrillation or irregular heartbeat  Vascular: No history of rest pain in feet.  No history of claudication.  No history of non-healing ulcer, No history of DVT   Pulmonary: No home oxygen, no productive cough, no hemoptysis,  No asthma or wheezing  Musculoskeletal:  [ ]  Arthritis, [ ]  Low back pain,  [ ]  Joint pain  Hematologic:No history of hypercoagulable state.  No history of easy bleeding.  No history of anemia  Gastrointestinal: No hematochezia or melena,  No gastroesophageal reflux, no trouble swallowing  Urinary: [ ]  chronic Kidney disease, [ ]  on HD - [ ]  MWF or [ ]  TTHS, [ ]  Burning with urination, [ ]  Frequent urination, [ ]  Difficulty urinating;   Skin: No rashes  Psychological: No history of anxiety,  No history of depression  Physical Examination  There were no vitals filed for this visit.  There is no height or weight on file to  calculate BMI.  General:  Alert and oriented, no acute distress HEENT: Normal Neck: No bruit or JVD Pulmonary: Clear to auscultation bilaterally Cardiac: Regular Rate and Rhythm without murmur Abdomen: Soft, non-tender, non-distended, no mass, no scars Skin: No rash Extremity Pulses:  2+ radial, brachial, femoral, dorsalis pedis, posterior tibial pulses bilaterally Musculoskeletal: No deformity or edema  Neurologic: Upper and lower extremity motor 5/5 and  symmetric  DATA:  Assessment:  Plan:  Roxy Horseman PA-C [facility] Office: [phone]

## 2019-06-14 DIAGNOSIS — Z1211 Encounter for screening for malignant neoplasm of colon: Secondary | ICD-10-CM | POA: Diagnosis not present

## 2019-06-28 ENCOUNTER — Telehealth: Payer: Self-pay | Admitting: *Deleted

## 2019-06-28 DIAGNOSIS — C50511 Malignant neoplasm of lower-outer quadrant of right female breast: Secondary | ICD-10-CM

## 2019-06-28 DIAGNOSIS — Z170 Estrogen receptor positive status [ER+]: Secondary | ICD-10-CM

## 2019-06-28 NOTE — Telephone Encounter (Signed)
VM left by pt stating she has palpated a new change in her surgical breast. She called to obtain an [place] at the breast center and was told she needed an order.  This RN called pt to inquire further and obtained VM.  Message left requesting a return call left as well as order for breast ultrasound order entered.

## 2019-06-30 ENCOUNTER — Other Ambulatory Visit: Payer: Self-pay

## 2019-06-30 ENCOUNTER — Inpatient Hospital Stay: Payer: Federal, State, Local not specified - PPO | Attending: Oncology | Admitting: Oncology

## 2019-06-30 VITALS — BP 140/80 | HR 71 | Temp 97.8°F | Resp 20 | Ht 67.0 in | Wt 180.2 lb

## 2019-06-30 DIAGNOSIS — C50511 Malignant neoplasm of lower-outer quadrant of right female breast: Secondary | ICD-10-CM | POA: Diagnosis not present

## 2019-06-30 DIAGNOSIS — Z923 Personal history of irradiation: Secondary | ICD-10-CM | POA: Diagnosis not present

## 2019-06-30 DIAGNOSIS — Z86718 Personal history of other venous thrombosis and embolism: Secondary | ICD-10-CM | POA: Diagnosis not present

## 2019-06-30 DIAGNOSIS — Z79811 Long term (current) use of aromatase inhibitors: Secondary | ICD-10-CM | POA: Insufficient documentation

## 2019-06-30 DIAGNOSIS — Z170 Estrogen receptor positive status [ER+]: Secondary | ICD-10-CM | POA: Diagnosis not present

## 2019-06-30 DIAGNOSIS — N899 Noninflammatory disorder of vagina, unspecified: Secondary | ICD-10-CM | POA: Diagnosis not present

## 2019-06-30 DIAGNOSIS — N6313 Unspecified lump in the right breast, lower outer quadrant: Secondary | ICD-10-CM | POA: Diagnosis not present

## 2019-06-30 DIAGNOSIS — Z86711 Personal history of pulmonary embolism: Secondary | ICD-10-CM | POA: Diagnosis not present

## 2019-06-30 MED ORDER — ANASTROZOLE 1 MG PO TABS: 1.0000 mg | ORAL_TABLET | Freq: Every day | ORAL | 4 refills | Status: DC

## 2019-06-30 NOTE — Progress Notes (Signed)
[facility]  [phone] [phone]     ID: MELISS Allison DOB: January 04, 1956  MR#: [id number]  JYN#:[id number]  Patient Care Team: Shanon Rosser, PA-C as PCP - General (Physician Assistant) Minus Breeding, MD as PCP - Cardiology (Cardiology) Rolm Bookbinder, MD as Consulting Physician (General Surgery) Srinidhi Landers, Virgie Dad, MD as Consulting Physician (Oncology) Gery Pray, MD as Consulting Physician (Radiation Oncology) OTHER MD:   CHIEF COMPLAINT: Estrogen receptor positive breast cancer  CURRENT TREATMENT: anastrozole   INTERVAL HISTORY: [name] returns today for follow-up of her estrogen receptor positive breast cancer. She contacted our office on 06/28/2019 to report a new, palpable change in her right breast.  She continues on anastrozole.  She denies problems with hot flashes but she does have some vaginal dryness issues.  This is longstanding.  Her most recent mammogram was 11/17/2018 at the breast center, showing breast density category C, with stable postsurgical changes in the right breast.  Since her last visit, she underwent repeat bone density screening on 12/28/2018. This showed a T-score of -0.4, which is considered normal.  She presented to the ED on 03/18/2019 with chest pain. Fortunately, workup consisting of serial troponin's, chest x-ray and CT, and EKG were negative.   REVIEW OF SYSTEMS: [name]'s grandmother died just short of 40 years old.  Unfortunately she died in a rehab facility.  [name] does not feel they really took adequate care of her and she has filed a complaint.  She herself retired in November.  She has been doing a lot of work trying to get her house in shape.  Aside from these issues a detailed review of systems today was stable   HISTORY OF CURRENT ILLNESS: From the original intake note:  [name] had routine bilateral screening mammography with tomography at the [facility] 10/08/2016. An area of possible  distortion in the right breast was noted. She was recalled for right diagnostic mammography with tomography on 11/08/2016. This found the breast density to be category C. The small area of architectural distortion in the lateral right breast persisted and on 11/11/2016 biopsy of this area showed ([id number]) invasive ductal carcinoma, E-cadherin positive, estrogen receptor 95% positive with strong staining intensity, progesterone receptor 5% positive, with strong staining intensity, with an MIB-1 of 10%, and no HER-2 amplification, with a signals ratio of 1.51 and number per cell 3.40. Right axillary ultrasound 11/13/2016 was sonographically benign.  Of note, she has a history of DVT diagnosed March 2010, felt to be possibly related to estrogen replacement therapy. She was coumadinized but developed a pulmonary embolus while on Coumadin. She was then had an IVC filter placed and was anticoagulated with Lovenox for 2 years. An extensive hypercoagulable workup was negative except for an elevated factor VIII level. Her IVC filter is still in place and the patient tells me she is participating in a lawsuit regarding that.   The patient's subsequent history is as detailed below.   PAST MEDICAL HISTORY: Past Medical History:  Diagnosis Date  . Anxiety   . Arthritis    neck  . Breast cancer ([place]) 2018   Right Breast Cancer  . DVT (deep venous thrombosis) ([place])    04/2010  . GERD (gastroesophageal reflux disease)   . Headache(784.0) 09/22/2012  . Heart murmur    dx in the 90s - said that it comes and goes - cardiologist said it was a mild murmur  . History of hiatal hernia   . History of radiation therapy  02/05/2017   02/05/17-03/05/17,  right breast 40.05 Gy in 15 fractions, right breast boost 10 Gy in 5 fractions  . Hyperlipidemia   . Hypertension   . Hyperthyroidism   . Iron deficiency anemia   . Palpitations   . Pulmonary embolism ([place]) 2010  . Uterine fibroid   . Vitamin D deficiency     Graves disease    PAST SURGICAL HISTORY: Past Surgical History:  Procedure Laterality Date  . BREAST LUMPECTOMY Right 12/16/2016  . BREAST LUMPECTOMY WITH RADIOACTIVE SEED AND SENTINEL LYMPH NODE BIOPSY Right 12/16/2016   Procedure: RIGHT BREAST LUMPECTOMY WITH RADIOACTIVE SEED AND SENTINEL LYMPH NODE BIOPSY;  Surgeon: Rolm Bookbinder, MD;  Location: [facility];  Service: General;  Laterality: Right;  . CHOLECYSTECTOMY    . COLONOSCOPY    . DILATION AND CURETTAGE OF UTERUS     01/2008  . ESOPHAGOGASTRODUODENOSCOPY    . ivc filter      FAMILY HISTORY Family History  Problem Relation Age of Onset  . Drug abuse Brother   . Hypertension Mother   . Colon cancer Maternal Grandfather    She notes that her father died at age 36 from an accidental head injury.The patient's mother is 33 years old as of October 2018. Pt has one brother and no sisters. Pt reports that a second cousin has a hx of breast cancer and was dx in her 4's. Pt denies family hx of ovarian cancer.   GYNECOLOGIC HISTORY:  Patient's last menstrual period was 06/18/2010. Menarche: 64 years old Age at first live birth: No children GP: GXP0 LMP: March 2016 Contraceptive: OCP on and off for many years d/c after DVT and PE diagnosis.  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  She lives at home alone.  She keeps no pets   ADVANCED DIRECTIVES: Not in place. At the 11/20/2016 visit the patient was given the appropriate documents to complete and notarize at her discretion   HEALTH MAINTENANCE: Social History   Tobacco Use  . Smoking status: Former Smoker    Packs/day: 0.40    Years: 10.00    Pack years: 4.00    Types: Cigarettes    Quit date: 06/01/2002    Years since quitting: 17.0  . Smokeless tobacco: Never Used  Substance Use Topics  . Alcohol use: Yes    Comment: occasional 1 drink per month  . Drug use: No      Colonoscopy: 2013  PAP: 08/30/2016   Bone density: 10/04/2015 with T-score of -0.3 at femur neck right   Allergies  Allergen Reactions  . Amoxicillin-Pot Clavulanate Other (See Comments)    Dizziness   . Niacin-Lovastatin Er Other (See Comments)    Caused flushing     Current Outpatient Medications  Medication Sig Dispense Refill  . anastrozole (ARIMIDEX) 1 MG tablet Take 1 tablet (1 mg total) by mouth daily. 90 tablet 4  . Ascorbic Acid (VITAMIN C) 1000 MG tablet Take 1,000 mg by mouth daily.    Marland Kitchen aspirin EC 81 MG tablet Take 1 tablet (81 mg total) by mouth daily. 90 tablet 3  . candesartan (ATACAND) 4 MG tablet     . Cholecalciferol (VITAMIN D-3 PO) Take 1 capsule by mouth daily.    . methimazole (TAPAZOLE) 5 MG tablet Take 5 mg by mouth daily.    . Omega-3 Fatty Acids (FISH OIL) 1200 MG CAPS Take 1  capsule by mouth daily.    Vladimir Faster Glycol-Propyl Glycol (SYSTANE OP) Place 1 drop into both eyes daily as needed (dry eyes).      No current facility-administered medications for this visit.    OBJECTIVE:  African-American woman who appears younger than stated age  54:   06/30/19 1455  BP: 140/80  Pulse: 71  Resp: 20  Temp: 97.8 F (36.6 C)  SpO2: 100%     Body mass index is 28.22 kg/m.   Wt Readings from Last 3 Encounters:  06/30/19 180 lb 3.2 oz (81.7 kg)  03/18/19 185 lb (83.9 kg)  12/21/18 183 lb 6.4 oz (83.2 kg)      ECOG FS:1 - Symptomatic but completely ambulatory  Sclerae unicteric, EOMs intact Wearing a mask No cervical or supraclavicular adenopathy Lungs no rales or rhonchi Heart regular rate and rhythm Abd soft, nontender, positive bowel sounds MSK no focal spinal tenderness, no upper extremity lymphedema Neuro: nonfocal, well oriented, appropriate affect Breasts: The right breast is status post lumpectomy and radiation.  In the inferior portion of there are some irregularities which may well be scar tissue from her prior treatment but  which to [facility] seem new.  Left breast is benign.  Both axillae are benign.   LAB RESULTS:  CMP     Component Value Date/Time   NA 141 03/18/2019 1631   NA 141 11/20/2016 0822   K 4.2 03/18/2019 1631   K 3.6 11/20/2016 0822   CL 106 03/18/2019 1631   CL 105 06/24/2012 1548   CO2 24 03/18/2019 1631   CO2 24 11/20/2016 0822   GLUCOSE 100 (H) 03/18/2019 1631   GLUCOSE 102 11/20/2016 0822   GLUCOSE 91 06/24/2012 1548   BUN 11 03/18/2019 1631   BUN 19.2 11/20/2016 0822   CREATININE 0.78 03/18/2019 1631   CREATININE 0.9 11/20/2016 0822   CALCIUM 9.7 03/18/2019 1631   CALCIUM 9.5 11/20/2016 0822   PROT 7.7 12/21/2018 1048   PROT 7.9 03/21/2017 1141   PROT 8.2 11/20/2016 0822   ALBUMIN 3.9 12/21/2018 1048   ALBUMIN 4.3 03/21/2017 1141   ALBUMIN 3.8 11/20/2016 0822   AST 15 12/21/2018 1048   AST 15 11/20/2016 0822   ALT 15 12/21/2018 1048   ALT 15 11/20/2016 0822   ALKPHOS 156 (H) 12/21/2018 1048   ALKPHOS 122 11/20/2016 0822   BILITOT 0.5 12/21/2018 1048   BILITOT 0.5 03/21/2017 1141   BILITOT 0.52 11/20/2016 0822   GFRNONAA >60 03/18/2019 1631   GFRAA >60 03/18/2019 1631    No results found for: TOTALPROTELP, ALBUMINELP, A1GS, A2GS, BETS, BETA2SER, GAMS, MSPIKE, SPEI  No results found for: KPAFRELGTCHN, LAMBDASER, KAPLAMBRATIO  Lab Results  Component Value Date   WBC 3.7 (L) 03/18/2019   NEUTROABS 2.1 12/21/2018   HGB 12.5 03/18/2019   HCT 38.7 03/18/2019   MCV 99.5 03/18/2019   PLT 157 03/18/2019    Lab Results  Component Value Date   LABCA2 17 04/26/2008    No components found for: NHAFBX038  No results for input(s): INR in the last 168 hours.  Lab Results  Component Value Date   LABCA2 17 04/26/2008    No results found for: BFX832  No results found for: NVB166  No results found for: MAY045  No results found for: CA2729  No components found for: HGQUANT  No results found for: CEA1 / No results found for: CEA1   No results found for:  AFPTUMOR  No results found for: [facility]  No results found for: HGBA, HGBA2QUANT, HGBFQUANT, HGBSQUAN (Hemoglobinopathy evaluation)   No results found for: LDH  Lab Results  Component Value Date   IRON 61 12/25/2012   TIBC 331 12/25/2012   IRONPCTSAT 18 (L) 12/25/2012   (Iron and TIBC)  Lab Results  Component Value Date   FERRITIN 31 12/25/2012    Urinalysis    Component Value Date/Time   COLORURINE LT YELLOW 04/18/2008 1220   APPEARANCEUR Clear 04/18/2008 1220   LABSPEC > OR = 1.030 04/18/2008 1220   PHURINE 5.5 04/18/2008 1220   GLUCOSEU NEGATIVE 04/18/2008 1220   BILIRUBINUR NEGATIVE 04/18/2008 1220   KETONESUR NEGATIVE 04/18/2008 1220   UROBILINOGEN 0.2 mg/dL 04/18/2008 1220   NITRITE Negative 04/18/2008 1220   LEUKOCYTESUR Negative 04/18/2008 1220    STUDIES: No results found.    ELIGIBLE FOR AVAILABLE RESEARCH PROTOCOL: no   ASSESSMENT: 64 y.o. [place] woman status post right breast lower outer quadrant biopsy 11/11/2016 for a clinical TX N), stage I invasive ductal carcinoma, grade 2, E-cadherin positive, estrogen receptor 95% positive, progesterone receptor 5% positive, with an MIB-1 of 10%, and no HER-2 amplification  (1) Status post right lumpectomy and right axillary sentinel lymph node sampling 12/16/2016 for a pT1c pN0, stage IA invasive ductal carcinoma, grade 1, with negative margins.  Total of 3 sentinel lymph nodes removed  (2) The Oncotype DX score was 18, predicting a risk of outside the breast recurrence over the next 10 years of 11% if the patient's only systemic therapy is tamoxifen for 5 years.  It also predicts no benefit from chemotherapy.  (3) Adjuvant radiation 02/05/2017-03/05/2017 Site/dose:    1. Right breast, 2.67 Gy in 15 fractions for a total dose of 40.05 Gy                    2. Right breast boost, 2 Gy in 5 fractions for a total dose of 10 Gy   (4) anastrozole started February 2019  (a) not a good tamoxifen candidate given  problem #5  (b) bone density at breast center 10/04/2015 with T-score of -0.3 (normal)  (c) bone density 12/28/2018 showed a T score of -0.4 (normal)  (5) history of DVT/PE March 2010, with negative extensive hypercoagulable workup, IVC filter in place   PLAN: Kaitlynn is now 2-1/2 years out from definitive surgery for her breast cancer with no evidence of disease recurrence.  This is very favorable.  She is tolerating anastrozole well.  The plan is to continue that a minimum of 5 years.  She does have problems with vaginal dryness.  I gave her information on our pelvic health program and she will let me know if she would like a referral.  I think very likely what we are feeling in the right breast is just scar tissue but I set her up for a diagnostic right mammography next week to confirm.  She will still need bilateral mammograms in October and I am going to see her in November and routine follow-up  I was sorry to hear her grandmother died.  It was wonderful that she lived almost 204  Total encounter time 30 minutes.*  Jacia Sickman, Virgie Dad, MD  06/30/19 3:53 PM Medical Oncology and Hematology [facility] [address] [place], [place] [address] Tel. [phone]    Fax. [phone]    I, Wilburn Mylar, am acting as scribe for Dr. Virgie Dad. Margarit Minshall.  Lindie Spruce MD, have reviewed the above documentation for accuracy  and completeness, and I agree with the above.   *Total Encounter Time as defined by the Centers for Medicare and Medicaid Services includes, in addition to the face-to-face time of a patient visit (documented in the note above) non-face-to-face time: obtaining and reviewing outside history, ordering and reviewing medications, tests or procedures, care coordination (communications with other health care professionals or caregivers) and documentation in the medical record.

## 2019-07-05 ENCOUNTER — Encounter: Payer: Self-pay | Admitting: Surgery

## 2019-07-05 ENCOUNTER — Other Ambulatory Visit: Payer: Self-pay | Admitting: Oncology

## 2019-07-19 ENCOUNTER — Ambulatory Visit
Admission: RE | Admit: 2019-07-19 | Discharge: 2019-07-19 | Disposition: A | Discharge status: Home / Self Care | Payer: Federal, State, Local not specified - PPO | Source: Ambulatory Visit | Attending: Oncology | Admitting: Oncology

## 2019-07-19 ENCOUNTER — Other Ambulatory Visit: Payer: Self-pay

## 2019-07-19 DIAGNOSIS — N6489 Other specified disorders of breast: Secondary | ICD-10-CM | POA: Diagnosis not present

## 2019-07-19 DIAGNOSIS — Z853 Personal history of malignant neoplasm of breast: Secondary | ICD-10-CM | POA: Diagnosis not present

## 2019-07-19 DIAGNOSIS — Z170 Estrogen receptor positive status [ER+]: Secondary | ICD-10-CM

## 2019-07-19 DIAGNOSIS — C50511 Malignant neoplasm of lower-outer quadrant of right female breast: Secondary | ICD-10-CM

## 2019-07-19 DIAGNOSIS — R922 Inconclusive mammogram: Secondary | ICD-10-CM | POA: Diagnosis not present

## 2019-08-19 DIAGNOSIS — J069 Acute upper respiratory infection, unspecified: Secondary | ICD-10-CM | POA: Diagnosis not present

## 2019-08-19 DIAGNOSIS — J029 Acute pharyngitis, unspecified: Secondary | ICD-10-CM | POA: Diagnosis not present

## 2019-08-31 ENCOUNTER — Encounter: Payer: Self-pay | Admitting: Podiatry

## 2019-08-31 ENCOUNTER — Ambulatory Visit: Payer: Federal, State, Local not specified - PPO | Admitting: Podiatry

## 2019-08-31 ENCOUNTER — Other Ambulatory Visit: Payer: Self-pay

## 2019-08-31 DIAGNOSIS — M79675 Pain in left toe(s): Secondary | ICD-10-CM

## 2019-08-31 DIAGNOSIS — L600 Ingrowing nail: Secondary | ICD-10-CM

## 2019-08-31 NOTE — Patient Instructions (Signed)
Ingrown Toenail An ingrown toenail occurs when the corner or sides of a toenail grow into the surrounding skin. This causes discomfort and pain. The big toe is most commonly affected, but any of the toes can be affected. If an ingrown toenail is not treated, it can become infected. What are the causes? This condition may be caused by:  Wearing shoes that are too small or tight.  An injury, such as stubbing your toe or having your toe stepped on.  Improper cutting or care of your toenails.  Having nail or foot abnormalities that were present from birth (congenital abnormalities), such as having a nail that is too big for your toe. What increases the risk? The following factors may make you more likely to develop ingrown toenails:  Age. Nails tend to get thicker with age, so ingrown nails are more common among older people.  Cutting your toenails incorrectly, such as cutting them very short or cutting them unevenly. An ingrown toenail is more likely to get infected if you have:  Diabetes.  Blood flow (circulation) problems. What are the signs or symptoms? Symptoms of an ingrown toenail may include:  Pain, soreness, or tenderness.  Redness.  Swelling.  Hardening of the skin that surrounds the toenail. Signs that an ingrown toenail may be infected include:  Fluid or pus.  Symptoms that get worse instead of better. How is this diagnosed? An ingrown toenail may be diagnosed based on your medical history, your symptoms, and a physical exam. If you have fluid or blood coming from your toenail, a sample may be collected to test for the specific type of bacteria that is causing the infection. How is this treated? Treatment depends on how severe your ingrown toenail is. You may be able to care for your toenail at home.  If you have an infection, you may be prescribed antibiotic medicines.  If you have fluid or pus draining from your toenail, your health care provider may drain  it.  If you have trouble walking, you may be given crutches to use.  If you have a severe or infected ingrown toenail, you may need a procedure to remove part or all of the nail. Follow these instructions at home: Foot care   Do not pick at your toenail or try to remove it yourself.  Soak your foot in warm, soapy water. Do this for 20 minutes, 3 times a day, or as often as told by your health care provider. This helps to keep your toe clean and keep your skin soft.  Wear shoes that fit well and are not too tight. Your health care provider may recommend that you wear open-toed shoes while you heal.  Trim your toenails regularly and carefully. Cut your toenails straight across to prevent injury to the skin at the corners of the toenail. Do not cut your nails in a curved shape.  Keep your feet clean and dry to help prevent infection. Medicines  Take over-the-counter and prescription medicines only as told by your health care provider.  If you were prescribed an antibiotic, take it as told by your health care provider. Do not stop taking the antibiotic even if you start to feel better. Activity  Return to your normal activities as told by your health care provider. Ask your health care provider what activities are safe for you.  Avoid activities that cause pain. General instructions  If your health care provider told you to use crutches to help you move around, use them   as instructed.  Keep all follow-up visits as told by your health care provider. This is important. Contact a health care provider if:  You have more redness, swelling, pain, or other symptoms that do not improve with treatment.  You have fluid, blood, or pus coming from your toenail. Get help right away if:  You have a red streak on your skin that starts at your foot and spreads up your leg.  You have a fever. Summary  An ingrown toenail occurs when the corner or sides of a toenail grow into the surrounding  skin. This causes discomfort and pain. The big toe is most commonly affected, but any of the toes can be affected.  If an ingrown toenail is not treated, it can become infected.  Fluid or pus draining from your toenail is a sign of infection. Your health care provider may need to drain it. You may be given antibiotics to treat the infection.  Trimming your toenails regularly and properly can help you prevent an ingrown toenail. This information is not intended to replace advice given to you by your health care provider. Make sure you discuss any questions you have with your health care provider. Document Revised: 05/22/2018 Document Reviewed: 10/16/2016 Elsevier Patient Education  2020 Elsevier Inc.  

## 2019-09-01 NOTE — Progress Notes (Signed)
Subjective:   Patient ID: [name], female   DOB: 64 y.o.   MRN: [id number]   HPI 64 year old female presents the office today for concerns of a possible ingrown toenail left big toe, medial aspect.  She states that when she was about 64 years old to the lateral border removed of both of her nails.  She states that she has been getting pedicures for the nails trimmed and polished however she states that she went through one incident with a to G causing bleeding and since then she has had ingrown toenail issue.  Denies any drainage or pus or any swelling.  Becomes tender at the tip of the toe and the left big toenail.  She has no other concerns today.   Review of Systems  All other systems reviewed and are negative.  Past Medical History:  Diagnosis Date  . Anxiety   . Arthritis    neck  . Breast cancer ([place]) 2018   Right Breast Cancer  . DVT (deep venous thrombosis) ([place])    04/2010  . GERD (gastroesophageal reflux disease)   . Headache(784.0) 09/22/2012  . Heart murmur    dx in the 90s - said that it comes and goes - cardiologist said it was a mild murmur  . History of hiatal hernia   . History of radiation therapy 02/05/2017   02/05/17-03/05/17,  right breast 40.05 Gy in 15 fractions, right breast boost 10 Gy in 5 fractions  . Hyperlipidemia   . Hypertension   . Hyperthyroidism   . Iron deficiency anemia   . Palpitations   . Personal history of radiation therapy   . Pulmonary embolism ([place]) 2010  . Uterine fibroid   . Vitamin D deficiency    Graves disease    Past Surgical History:  Procedure Laterality Date  . BREAST LUMPECTOMY Right 12/16/2016  . BREAST LUMPECTOMY WITH RADIOACTIVE SEED AND SENTINEL LYMPH NODE BIOPSY Right 12/16/2016   Procedure: RIGHT BREAST LUMPECTOMY WITH RADIOACTIVE SEED AND SENTINEL LYMPH NODE BIOPSY;  Surgeon: Rolm Bookbinder, MD;  Location: [facility];  Service: General;  Laterality: Right;  . CHOLECYSTECTOMY    . COLONOSCOPY    . DILATION AND  CURETTAGE OF UTERUS     01/2008  . ESOPHAGOGASTRODUODENOSCOPY    . ivc filter       Current Outpatient Medications:  .  anastrozole (ARIMIDEX) 1 MG tablet, Take 1 tablet (1 mg total) by mouth daily., Disp: 90 tablet, Rfl: 4 .  Ascorbic Acid (VITAMIN C) 1000 MG tablet, Take 1,000 mg by mouth daily., Disp: , Rfl:  .  aspirin EC 81 MG tablet, Take 1 tablet (81 mg total) by mouth daily., Disp: 90 tablet, Rfl: 3 .  candesartan (ATACAND) 4 MG tablet, , Disp: , Rfl:  .  Cholecalciferol (VITAMIN D-3 PO), Take 1 capsule by mouth daily., Disp: , Rfl:  .  methimazole (TAPAZOLE) 5 MG tablet, Take 5 mg by mouth daily., Disp: , Rfl:  .  Omega-3 Fatty Acids (FISH OIL) 1200 MG CAPS, Take 1 capsule by mouth daily., Disp: , Rfl:  .  Polyethyl Glycol-Propyl Glycol (SYSTANE OP), Place 1 drop into both eyes daily as needed (dry eyes). , Disp: , Rfl:   Allergies  Allergen Reactions  . Amoxicillin-Pot Clavulanate Other (See Comments)    Dizziness   . Niacin-Lovastatin Er Other (See Comments)    Caused flushing         Objective:  Physical Exam  General: AAO x3, NAD  Dermatological: There is mild incurvation present to medial aspect the left hallux toenail and there is no edema, erythema, drainage or pus or any signs of infection.  There is minimal discomfort to the distal medial nail border left hallux.  There is no open lesions.  Vascular: Dorsalis Pedis artery and Posterior Tibial artery pedal pulses are 2/4 bilateral with immedate capillary fill time.There is no pain with calf compression, swelling, warmth, erythema.   Neruologic: Grossly intact via light touch bilateral.   Musculoskeletal: No gross boney pedal deformities bilateral. No pain, crepitus, or limitation noted with foot and ankle range of motion bilateral. Muscular strength 5/5 in all groups tested bilateral.  Gait: Unassisted, Nonantalgic.       Assessment:   Ingrown toenail left medial hallux nail border, distal aspect      Plan:  -Treatment options discussed including all alternatives, risks, and complications -Etiology of symptoms were discussed -Discussed partial nail avulsion however she wants to hold off on this.  Did lightly debride the central portion of ingrown toenail but any complications or bleeding.  Small piece of nail.  Recommended to keep the nail trimmed straight across.  Monitor for signs or symptoms of infection.  Return if symptoms worsen or fail to improve.

## 2019-10-20 ENCOUNTER — Other Ambulatory Visit: Payer: Self-pay | Admitting: Oncology

## 2019-10-20 DIAGNOSIS — Z9889 Other specified postprocedural states: Secondary | ICD-10-CM

## 2019-11-19 ENCOUNTER — Other Ambulatory Visit: Payer: Self-pay

## 2019-11-19 ENCOUNTER — Ambulatory Visit
Admission: RE | Admit: 2019-11-19 | Discharge: 2019-11-19 | Disposition: A | Discharge status: Home / Self Care | Payer: Federal, State, Local not specified - PPO | Source: Ambulatory Visit | Attending: Oncology | Admitting: Oncology

## 2019-11-19 DIAGNOSIS — Z9889 Other specified postprocedural states: Secondary | ICD-10-CM

## 2019-11-19 DIAGNOSIS — R928 Other abnormal and inconclusive findings on diagnostic imaging of breast: Secondary | ICD-10-CM | POA: Diagnosis not present

## 2019-11-28 DIAGNOSIS — Z20822 Contact with and (suspected) exposure to covid-19: Secondary | ICD-10-CM | POA: Diagnosis not present

## 2019-12-01 DIAGNOSIS — E0590 Thyrotoxicosis, unspecified without thyrotoxic crisis or storm: Secondary | ICD-10-CM | POA: Diagnosis not present

## 2019-12-09 DIAGNOSIS — Z113 Encounter for screening for infections with a predominantly sexual mode of transmission: Secondary | ICD-10-CM | POA: Diagnosis not present

## 2019-12-09 DIAGNOSIS — R309 Painful micturition, unspecified: Secondary | ICD-10-CM | POA: Diagnosis not present

## 2019-12-09 DIAGNOSIS — N760 Acute vaginitis: Secondary | ICD-10-CM | POA: Diagnosis not present

## 2019-12-09 DIAGNOSIS — Z124 Encounter for screening for malignant neoplasm of cervix: Secondary | ICD-10-CM | POA: Diagnosis not present

## 2019-12-09 DIAGNOSIS — Z01419 Encounter for gynecological examination (general) (routine) without abnormal findings: Secondary | ICD-10-CM | POA: Diagnosis not present

## 2019-12-15 DIAGNOSIS — I10 Essential (primary) hypertension: Secondary | ICD-10-CM | POA: Diagnosis not present

## 2019-12-15 DIAGNOSIS — Z1159 Encounter for screening for other viral diseases: Secondary | ICD-10-CM | POA: Diagnosis not present

## 2019-12-15 DIAGNOSIS — Z79899 Other long term (current) drug therapy: Secondary | ICD-10-CM | POA: Diagnosis not present

## 2019-12-15 DIAGNOSIS — E785 Hyperlipidemia, unspecified: Secondary | ICD-10-CM | POA: Diagnosis not present

## 2019-12-20 NOTE — Progress Notes (Signed)
[place]  [phone] [phone]) [phone]     ID: [name] DOB: [birth year]  MR#: [id number]  HTD#:[id number]  Patient Care Team: Shanon Rosser, PA-C as PCP - General (Physician Assistant) Minus Breeding, MD as PCP - Cardiology (Cardiology) Rolm Bookbinder, MD as Consulting Physician (General Surgery) Carmella Kees, Virgie Dad, MD as Consulting Physician (Oncology) Gery Pray, MD as Consulting Physician (Radiation Oncology) Bobbye Charleston, MD as Consulting Physician (Obstetrics and Gynecology) OTHER MD:   CHIEF COMPLAINT: Estrogen receptor positive breast cancer  CURRENT TREATMENT: anastrozole   INTERVAL HISTORY: [name] returns today for follow-up of her estrogen receptor positive breast cancer.   She continues on anastrozole.  She denies problems with hot flashes but she does have some vaginal dryness issues.  This is longstanding.  Her most recent repeat bone density screening on 12/28/2018 showed a T-score of -0.4, which is considered normal.  At her last visit, she noted a palpable abnormality. This was felt to represent scar tissue, but she was set up with mammography and ultrasonography to confirm. Performed on 07/19/2019, diagnostic right mammogram and right breast ultrasound showed: breast density category C; no evidence of malignancy in right breast.  She returned for annual bilateral diagnostic mammography with tomography at [facility] on 11/19/2019 showing: breast density category B; no evidence of malignancy in either breast.    REVIEW OF SYSTEMS: [name] does not have dyspareunia but she does have some bleeding with intercourse.  She feels wobbly at times although she says she is feeling better.  She was started on fluoxetine by Dr. Philis Pique but after 1 dose she felt very dizzy and has not tried it again.  She feels her brain is "foggy" at times.  A detailed review of systems was otherwise stable   COVID 19 VACCINATION STATUS:     HISTORY OF CURRENT ILLNESS: From the original intake note:  [name] had routine bilateral screening mammography with tomography at [facility] 10/08/2016. An area of possible distortion in the right breast was noted. She was recalled for right diagnostic mammography with tomography on 11/08/2016. This found the breast density to be category C. The small area of architectural distortion in the lateral right breast persisted and on 11/11/2016 biopsy of this area showed ([id number]) invasive ductal carcinoma, E-cadherin positive, estrogen receptor 95% positive with strong staining intensity, progesterone receptor 5% positive, with strong staining intensity, with an MIB-1 of 10%, and no HER-2 amplification, with a signals ratio of 1.51 and number per cell 3.40. Right axillary ultrasound 11/13/2016 was sonographically benign.  Of note, she has a history of DVT diagnosed March 2010, felt to be possibly related to estrogen replacement therapy. She was coumadinized but developed a pulmonary embolus while on Coumadin. She was then had an IVC filter placed and was anticoagulated with Lovenox for 2 years. An extensive hypercoagulable workup was negative except for an elevated factor VIII level. Her IVC filter is still in place and the patient tells me she is participating in a lawsuit regarding that.   The patient's subsequent history is as detailed below.   PAST MEDICAL HISTORY: Past Medical History:  Diagnosis Date  . Anxiety   . Arthritis    neck  . Breast cancer ([place]) 2018   Right Breast Cancer  . DVT (deep venous thrombosis) ([place])    04/2010  . GERD (gastroesophageal reflux disease)   . Headache(784.0) 09/22/2012  . Heart murmur    dx in the 90s - said that it  comes and goes - cardiologist said it was a mild murmur  . History of hiatal hernia   . History of radiation therapy 02/05/2017   02/05/17-03/05/17,  right breast 40.05 Gy in 15 fractions, right breast boost 10 Gy in 5 fractions   . Hyperlipidemia   . Hypertension   . Hyperthyroidism   . Iron deficiency anemia   . Palpitations   . Personal history of radiation therapy 2018   Right Breast Cancer  . Pulmonary embolism ([place]) 2010  . Uterine fibroid   . Vitamin D deficiency    Graves disease    PAST SURGICAL HISTORY: Past Surgical History:  Procedure Laterality Date  . BREAST LUMPECTOMY Right 12/16/2016  . BREAST LUMPECTOMY WITH RADIOACTIVE SEED AND SENTINEL LYMPH NODE BIOPSY Right 12/16/2016   Procedure: RIGHT BREAST LUMPECTOMY WITH RADIOACTIVE SEED AND SENTINEL LYMPH NODE BIOPSY;  Surgeon: Rolm Bookbinder, MD;  Location: [facility];  Service: General;  Laterality: Right;  . CHOLECYSTECTOMY    . COLONOSCOPY    . DILATION AND CURETTAGE OF UTERUS     01/2008  . ESOPHAGOGASTRODUODENOSCOPY    . ivc filter      FAMILY HISTORY Family History  Problem Relation Age of Onset  . Drug abuse Brother   . Hypertension Mother   . Colon cancer Maternal Grandfather    She notes that her father died at age 21 from an accidental head injury.The patient's mother is 7 years old as of October 2018. Pt has one brother and no sisters. Pt reports that a second cousin has a hx of breast cancer and was dx in her 13's. Pt denies family hx of ovarian cancer.   GYNECOLOGIC HISTORY:  Patient's last menstrual period was 06/18/2010. Menarche: 64 years old Age at first live birth: No children GP: GXP0 LMP: March 2016 Contraceptive: OCP on and off for many years d/c after DVT and PE diagnosis.  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  She lives at home alone.  She keeps no pets   ADVANCED DIRECTIVES: Not in place. At the 11/20/2016 visit the patient was given the appropriate documents to complete and notarize at her discretion   HEALTH MAINTENANCE: Social History   Tobacco Use  . Smoking status: Former Smoker    Packs/day: 0.40     Years: 10.00    Pack years: 4.00    Types: Cigarettes    Quit date: 06/01/2002    Years since quitting: 17.5  . Smokeless tobacco: Never Used  Vaping Use  . Vaping Use: Never used  Substance Use Topics  . Alcohol use: Yes    Comment: occasional 1 drink per month  . Drug use: No     Colonoscopy: 2013  PAP: 08/30/2016   Bone density: 10/04/2015 with T-score of -0.3 at femur neck right   Allergies  Allergen Reactions  . Amoxicillin-Pot Clavulanate Other (See Comments)    Dizziness   . Niacin-Lovastatin Er Other (See Comments)    Caused flushing     Current Outpatient Medications  Medication Sig Dispense Refill  . anastrozole (ARIMIDEX) 1 MG tablet Take 1 tablet (1 mg total) by mouth daily. 90 tablet 4  . Ascorbic Acid (VITAMIN C) 1000 MG tablet Take 1,000 mg by mouth daily.    Marland Kitchen aspirin EC 81 MG tablet Take 1 tablet (81 mg total) by mouth daily. 90 tablet 3  . candesartan (ATACAND) 4  MG tablet     . Cholecalciferol (VITAMIN D-3 PO) Take 1 capsule by mouth daily.    . methimazole (TAPAZOLE) 5 MG tablet Take 5 mg by mouth daily.    . Omega-3 Fatty Acids (FISH OIL) 1200 MG CAPS Take 1 capsule by mouth daily.    Vladimir Faster Glycol-Propyl Glycol (SYSTANE OP) Place 1 drop into both eyes daily as needed (dry eyes).      No current facility-administered medications for this visit.    OBJECTIVE:  African-American woman who appears younger than stated age  79:   12/21/19 1123  BP: 131/84  Pulse: 63  Resp: 18  Temp: 97.6 F (36.4 C)  SpO2: 98%     Body mass index is 29.57 kg/m.   Wt Readings from Last 3 Encounters:  12/21/19 188 lb 12.8 oz (85.6 kg)  06/30/19 180 lb 3.2 oz (81.7 kg)  03/18/19 185 lb (83.9 kg)      ECOG FS:1 - Symptomatic but completely ambulatory  Sclerae unicteric, EOMs intact Wearing a mask No cervical or supraclavicular adenopathy Lungs no rales or rhonchi Heart regular rate and rhythm Abd soft, nontender, positive bowel sounds MSK no focal  spinal tenderness, no upper extremity lymphedema Neuro: nonfocal, well oriented, appropriate affect Breasts: The right breast has undergone lumpectomy and radiation.  There is no evidence of local recurrence.  The left breast is benign.  Both axillae are benign.   LAB RESULTS:  CMP     Component Value Date/Time   NA 139 12/21/2019 1042   NA 141 11/20/2016 0822   K 4.4 12/21/2019 1042   K 3.6 11/20/2016 0822   CL 106 12/21/2019 1042   CL 105 06/24/2012 1548   CO2 28 12/21/2019 1042   CO2 24 11/20/2016 0822   GLUCOSE 106 (H) 12/21/2019 1042   GLUCOSE 102 11/20/2016 0822   GLUCOSE 91 06/24/2012 1548   BUN 13 12/21/2019 1042   BUN 19.2 11/20/2016 0822   CREATININE 0.90 12/21/2019 1042   CREATININE 0.9 11/20/2016 0822   CALCIUM 9.4 12/21/2019 1042   CALCIUM 9.5 11/20/2016 0822   PROT 7.9 12/21/2019 1042   PROT 7.9 03/21/2017 1141   PROT 8.2 11/20/2016 0822   ALBUMIN 3.8 12/21/2019 1042   ALBUMIN 4.3 03/21/2017 1141   ALBUMIN 3.8 11/20/2016 0822   AST 16 12/21/2019 1042   AST 15 11/20/2016 0822   ALT 15 12/21/2019 1042   ALT 15 11/20/2016 0822   ALKPHOS 158 (H) 12/21/2019 1042   ALKPHOS 122 11/20/2016 0822   BILITOT 0.5 12/21/2019 1042   BILITOT 0.5 03/21/2017 1141   BILITOT 0.52 11/20/2016 0822   GFRNONAA >60 12/21/2019 1042   GFRAA >60 03/18/2019 1631    No results found for: TOTALPROTELP, ALBUMINELP, A1GS, A2GS, BETS, BETA2SER, GAMS, MSPIKE, SPEI  No results found for: KPAFRELGTCHN, LAMBDASER, KAPLAMBRATIO  Lab Results  Component Value Date   WBC 5.2 12/21/2019   NEUTROABS 2.4 12/21/2019   HGB 12.1 12/21/2019   HCT 36.9 12/21/2019   MCV 98.4 12/21/2019   PLT 171 12/21/2019    Lab Results  Component Value Date   LABCA2 17 04/26/2008    No components found for: RXVQMG867  No results for input(s): INR in the last 168 hours.  Lab Results  Component Value Date   LABCA2 17 04/26/2008    No results found for: YPP509  No results found for: TOI712  No  results found for: WPY099  No results found for: CA2729  No components found  for: HGQUANT  No results found for: CEA1 / No results found for: CEA1   No results found for: AFPTUMOR  No results found for: CHROMOGRNA  No results found for: HGBA, HGBA2QUANT, HGBFQUANT, HGBSQUAN (Hemoglobinopathy evaluation)   No results found for: LDH  Lab Results  Component Value Date   IRON 61 12/25/2012   TIBC 331 12/25/2012   IRONPCTSAT 18 (L) 12/25/2012   (Iron and TIBC)  Lab Results  Component Value Date   FERRITIN 31 12/25/2012    Urinalysis    Component Value Date/Time   COLORURINE LT YELLOW 04/18/2008 1220   APPEARANCEUR Clear 04/18/2008 1220   LABSPEC > OR = 1.030 04/18/2008 1220   PHURINE 5.5 04/18/2008 1220   GLUCOSEU NEGATIVE 04/18/2008 1220   BILIRUBINUR NEGATIVE 04/18/2008 1220   KETONESUR NEGATIVE 04/18/2008 1220   UROBILINOGEN 0.2 mg/dL 04/18/2008 1220   NITRITE Negative 04/18/2008 1220   LEUKOCYTESUR Negative 04/18/2008 1220    STUDIES: No results found.    ELIGIBLE FOR AVAILABLE RESEARCH PROTOCOL: no   ASSESSMENT: 64 y.o. [name] woman status post right breast lower outer quadrant biopsy 11/11/2016 for a clinical TX N), stage I invasive ductal carcinoma, grade 2, E-cadherin positive, estrogen receptor 95% positive, progesterone receptor 5% positive, with an MIB-1 of 10%, and no HER-2 amplification  (1) Status post right lumpectomy and right axillary sentinel lymph node sampling 12/16/2016 for a pT1c pN0, stage IA invasive ductal carcinoma, grade 1, with negative margins.  Total of 3 sentinel lymph nodes removed  (2) The Oncotype DX score was 18, predicting a risk of outside the breast recurrence over the next 10 years of 11% if the patient's only systemic therapy is tamoxifen for 5 years.  It also predicts no benefit from chemotherapy.  (3) Adjuvant radiation 02/05/2017-03/05/2017 Site/dose:    1. Right breast, 2.67 Gy in 15 fractions for a total dose of  40.05 Gy                    2. Right breast boost, 2 Gy in 5 fractions for a total dose of 10 Gy   (4) anastrozole started February 2019  (a) not a good tamoxifen candidate given problem #5  (b) bone density at breast center 10/04/2015 with T-score of -0.3 (normal)  (c) bone density 12/28/2018 showed a T score of -0.4 (normal)  (5) history of DVT/PE March 2010, with negative extensive hypercoagulable workup, IVC filter in place   PLAN: [name] is now 3 years out from definitive surgery for breast cancer with no evidence of disease recurrence.  This is very favorable.  She is tolerating anastrozole generally well.  Because of the history of DVT/PE we are not going to switch to tamoxifen, which would allow [place] to safely use vaginal estrogens and that would be beneficial for her.  The plan is for anastrozole to continue for a total of 5years.  She will see me again a year from now.  She knows to call for any other issue that may develop before then  Total encounter time 20 minutes.*   Noela Brothers, Virgie Dad, MD  12/21/19 5:49 PM Medical Oncology and Hematology [facility] [address] [place], [place] [address] Tel. [phone]    Fax. [phone]    I, Wilburn Mylar, am acting as scribe for Dr. Virgie Dad. Lilly Gasser.  I, Lurline Del MD, have reviewed the above documentation for accuracy and completeness, and I agree with the above.   *Total Encounter Time as defined  by the Centers for Medicare and Medicaid Services includes, in addition to the face-to-face time of a patient visit (documented in the note above) non-face-to-face time: obtaining and reviewing outside history, ordering and reviewing medications, tests or procedures, care coordination (communications with other health care professionals or caregivers) and documentation in the medical record.

## 2019-12-21 ENCOUNTER — Inpatient Hospital Stay: Payer: Federal, State, Local not specified - PPO | Admitting: Oncology

## 2019-12-21 ENCOUNTER — Inpatient Hospital Stay: Payer: Federal, State, Local not specified - PPO | Attending: Oncology

## 2019-12-21 ENCOUNTER — Other Ambulatory Visit: Payer: Self-pay

## 2019-12-21 VITALS — BP 131/84 | HR 63 | Temp 97.6°F | Resp 18 | Ht 67.0 in | Wt 188.8 lb

## 2019-12-21 DIAGNOSIS — Z9049 Acquired absence of other specified parts of digestive tract: Secondary | ICD-10-CM | POA: Diagnosis not present

## 2019-12-21 DIAGNOSIS — Z8249 Family history of ischemic heart disease and other diseases of the circulatory system: Secondary | ICD-10-CM | POA: Insufficient documentation

## 2019-12-21 DIAGNOSIS — I2699 Other pulmonary embolism without acute cor pulmonale: Secondary | ICD-10-CM

## 2019-12-21 DIAGNOSIS — Z800 Family history of malignant neoplasm of digestive organs: Secondary | ICD-10-CM | POA: Diagnosis not present

## 2019-12-21 DIAGNOSIS — Z880 Allergy status to penicillin: Secondary | ICD-10-CM | POA: Insufficient documentation

## 2019-12-21 DIAGNOSIS — Z814 Family history of other substance abuse and dependence: Secondary | ICD-10-CM | POA: Diagnosis not present

## 2019-12-21 DIAGNOSIS — Z79811 Long term (current) use of aromatase inhibitors: Secondary | ICD-10-CM | POA: Diagnosis not present

## 2019-12-21 DIAGNOSIS — Z170 Estrogen receptor positive status [ER+]: Secondary | ICD-10-CM

## 2019-12-21 DIAGNOSIS — Z86711 Personal history of pulmonary embolism: Secondary | ICD-10-CM | POA: Insufficient documentation

## 2019-12-21 DIAGNOSIS — Z86718 Personal history of other venous thrombosis and embolism: Secondary | ICD-10-CM | POA: Diagnosis not present

## 2019-12-21 DIAGNOSIS — Z923 Personal history of irradiation: Secondary | ICD-10-CM | POA: Diagnosis not present

## 2019-12-21 DIAGNOSIS — Z79899 Other long term (current) drug therapy: Secondary | ICD-10-CM | POA: Insufficient documentation

## 2019-12-21 DIAGNOSIS — Z7901 Long term (current) use of anticoagulants: Secondary | ICD-10-CM | POA: Insufficient documentation

## 2019-12-21 DIAGNOSIS — C50511 Malignant neoplasm of lower-outer quadrant of right female breast: Secondary | ICD-10-CM

## 2019-12-21 DIAGNOSIS — Z87891 Personal history of nicotine dependence: Secondary | ICD-10-CM | POA: Diagnosis not present

## 2019-12-21 DIAGNOSIS — M1990 Unspecified osteoarthritis, unspecified site: Secondary | ICD-10-CM | POA: Diagnosis not present

## 2019-12-21 DIAGNOSIS — D508 Other iron deficiency anemias: Secondary | ICD-10-CM

## 2019-12-21 LAB — CBC WITH DIFFERENTIAL/PLATELET
Abs Immature Granulocytes: 0.01 10*3/uL (ref 0.00–0.07)
Basophils Absolute: 0 10*3/uL (ref 0.0–0.1)
Basophils Relative: 1 %
Eosinophils Absolute: 0.3 10*3/uL (ref 0.0–0.5)
Eosinophils Relative: 6 %
HCT: 36.9 % (ref 36.0–46.0)
Hemoglobin: 12.1 g/dL (ref 12.0–15.0)
Immature Granulocytes: 0 %
Lymphocytes Relative: 41 %
Lymphs Abs: 2.1 10*3/uL (ref 0.7–4.0)
MCH: 32.3 pg (ref 26.0–34.0)
MCHC: 32.8 g/dL (ref 30.0–36.0)
MCV: 98.4 fL (ref 80.0–100.0)
Monocytes Absolute: 0.3 10*3/uL (ref 0.1–1.0)
Monocytes Relative: 6 %
Neutro Abs: 2.4 10*3/uL (ref 1.7–7.7)
Neutrophils Relative %: 46 %
Platelets: 171 10*3/uL (ref 150–400)
RBC: 3.75 MIL/uL — ABNORMAL LOW (ref 3.87–5.11)
RDW: 12.4 % (ref 11.5–15.5)
WBC: 5.2 10*3/uL (ref 4.0–10.5)
nRBC: 0 % (ref 0.0–0.2)

## 2019-12-21 LAB — COMPREHENSIVE METABOLIC PANEL
ALT: 15 U/L (ref 0–44)
AST: 16 U/L (ref 15–41)
Albumin: 3.8 g/dL (ref 3.5–5.0)
Alkaline Phosphatase: 158 U/L — ABNORMAL HIGH (ref 38–126)
Anion gap: 5 (ref 5–15)
BUN: 13 mg/dL (ref 8–23)
CO2: 28 mmol/L (ref 22–32)
Calcium: 9.4 mg/dL (ref 8.9–10.3)
Chloride: 106 mmol/L (ref 98–111)
Creatinine, Ser: 0.9 mg/dL (ref 0.44–1.00)
GFR, Estimated: >60 mL/min (ref >60)
Glucose, Bld: 106 mg/dL — ABNORMAL HIGH (ref 70–99)
Potassium: 4.4 mmol/L (ref 3.5–5.1)
Sodium: 139 mmol/L (ref 135–145)
Total Bilirubin: 0.5 mg/dL (ref 0.3–1.2)
Total Protein: 7.9 g/dL (ref 6.5–8.1)

## 2019-12-21 MED ORDER — ANASTROZOLE 1 MG PO TABS: 1.0000 mg | ORAL_TABLET | Freq: Every day | ORAL | 4 refills | Status: DC

## 2019-12-27 ENCOUNTER — Other Ambulatory Visit: Payer: Self-pay

## 2019-12-27 DIAGNOSIS — C50511 Malignant neoplasm of lower-outer quadrant of right female breast: Secondary | ICD-10-CM

## 2019-12-27 NOTE — Progress Notes (Signed)
Pt understands referral has been placed and understands she should call if she has not heard from them.

## 2020-01-03 DIAGNOSIS — Z0000 Encounter for general adult medical examination without abnormal findings: Secondary | ICD-10-CM | POA: Diagnosis not present

## 2020-01-30 DIAGNOSIS — Z20822 Contact with and (suspected) exposure to covid-19: Secondary | ICD-10-CM | POA: Diagnosis not present

## 2020-02-15 NOTE — Progress Notes (Signed)
Name: [name]  MRN/ DOB: [id number], Jan 28, 1956    Age/ Sex: 65 y.o., female    PCP: Lindaann Pascal, PA-C   Reason for Endocrinology Evaluation: Hyperthyroidism     Date of Initial Endocrinology Evaluation: 02/16/2020     HPI: [name] is a 65 y.o. female with a past medical history of Hx of breast ca ( S/P right lumpectomy 2018 and radiation ) and Graves' disease. The patient presented for initial endocrinology clinic visit on 02/16/2020 for consultative assistance with her Hyperthyroidism  She has been diagnosed with Graves' disease in 2016. Had a thyroid uptake and scan on 08/13/2014 at 57.5 % of I-131 , uniform uptake consistent with Graves's disease. Has been on Methimazole since her diagnosis.    Denies weight loss  Denies diarrhea  Denies palpitations except for the past few months noted mild palpitations  Denies tremors    HOME ENDOCRINE MEDICATIONS: Methimazole 5 mg daily , half a tablet daily       HISTORY:  Past Medical History:  Past Medical History:  Diagnosis Date  . Anxiety   . Arthritis    neck  . Breast cancer ([facility]) 2018   Right Breast Cancer  . DVT (deep venous thrombosis) ([facility])    04/2010  . GERD (gastroesophageal reflux disease)   . Headache(784.0) 09/22/2012  . Heart murmur    dx in the 90s - said that it comes and goes - cardiologist said it was a mild murmur  . History of hiatal hernia   . History of radiation therapy 02/05/2017   02/05/17-03/05/17,  right breast 40.05 Gy in 15 fractions, right breast boost 10 Gy in 5 fractions  . Hyperlipidemia   . Hypertension   . Hyperthyroidism   . Iron deficiency anemia   . Palpitations   . Personal history of radiation therapy 2018   Right Breast Cancer  . Pulmonary embolism ([facility]) 2010  . Uterine fibroid   . Vitamin D deficiency    Graves disease   Past Surgical History:  Past Surgical History:  Procedure Laterality Date  . BREAST LUMPECTOMY Right 12/16/2016  . BREAST LUMPECTOMY  WITH RADIOACTIVE SEED AND SENTINEL LYMPH NODE BIOPSY Right 12/16/2016   Procedure: RIGHT BREAST LUMPECTOMY WITH RADIOACTIVE SEED AND SENTINEL LYMPH NODE BIOPSY;  Surgeon: Emelia Loron, MD;  Location: [facility] OR;  Service: General;  Laterality: Right;  . CHOLECYSTECTOMY    . COLONOSCOPY    . DILATION AND CURETTAGE OF UTERUS     01/2008  . ESOPHAGOGASTRODUODENOSCOPY    . ivc filter        Social History:  reports that she quit smoking about 17 years ago. Her smoking use included cigarettes. She has a 4.00 pack-year smoking history. She has never used smokeless tobacco. She reports current alcohol use. She reports that she does not use drugs.  Family History: family history includes Colon cancer in her maternal grandfather; Drug abuse in her brother; Hypertension in her mother.   HOME MEDICATIONS: Allergies as of 02/16/2020      Reactions   Amoxicillin-pot Clavulanate Other (See Comments)   Dizziness   Niacin-lovastatin Er Other (See Comments)   Caused flushing      Medication List       Accurate as of February 16, 2020 12:03 PM. If you have any questions, ask your nurse or doctor.        anastrozole 1 MG tablet Commonly known as: ARIMIDEX Take 1 tablet (1 mg total) by  mouth daily.   aspirin EC 81 MG tablet Take 1 tablet (81 mg total) by mouth daily.   candesartan 4 MG tablet Commonly known as: ATACAND   Fish Oil 1200 MG Caps Take 1 capsule by mouth daily.   methimazole 5 MG tablet Commonly known as: TAPAZOLE Take 5 mg by mouth daily.   SYSTANE OP Place 1 drop into both eyes daily as needed (dry eyes).   vitamin C 1000 MG tablet Take 1,000 mg by mouth daily.   VITAMIN D-3 PO Take 1 capsule by mouth daily.         REVIEW OF SYSTEMS: A comprehensive ROS was conducted with the patient and is negative except as per HPI    OBJECTIVE:  VS: BP 124/82   Pulse 68   Ht 5' 6.75" (1.695 m)   Wt 191 lb (86.6 kg)   LMP 06/18/2010   SpO2 98%   BMI 30.14 kg/m     Wt Readings from Last 3 Encounters:  02/16/20 191 lb (86.6 kg)  12/21/19 188 lb 12.8 oz (85.6 kg)  06/30/19 180 lb 3.2 oz (81.7 kg)     EXAM: General: Pt appears well and is in NAD  Eyes: External eye exam normal without stare, lid lag or exophthalmos.  EOM intact.  PERRL.  Neck: General: Supple without adenopathy. Thyroid: Thyroid size normal.  No goiter or nodules appreciated. No thyroid bruit.  Lungs: Clear with good BS bilat with no rales, rhonchi, or wheezes  Heart: Auscultation: RRR.  Abdomen: Normoactive bowel sounds, soft, nontender, without masses or organomegaly palpable  Extremities:  BL LE: No pretibial edema normal ROM and strength.  Skin: Hair: Texture and amount normal with gender appropriate distribution Skin Inspection: No rashes Skin Palpation: Skin temperature, texture, and thickness normal to palpation  Neuro: Cranial nerves: II - XII grossly intact  Motor: Normal strength throughout DTRs: 2+ and symmetric in UE without delay in relaxation phase  Mental Status: Judgment, insight: Intact Memory: Intact for recent and remote events Mood and affect: No depression, anxiety, or agitation     DATA REVIEWED:   Results for [name], [name] (MRN [id number]) as of 02/16/2020 14:19  Ref. Range 02/16/2020 10:00  TSH Latest Ref Range: 0.35 - 4.50 uIU/mL 4.68 (H)  T4,Free(Direct) Latest Ref Range: 0.60 - 1.60 ng/dL 2.75    Results for [name], [name] (MRN [id number]) as of 02/16/2020 09:30  Ref. Range 12/21/2019 10:42  Sodium Latest Ref Range: 135 - 145 mmol/L 139  Potassium Latest Ref Range: 3.5 - 5.1 mmol/L 4.4  Chloride Latest Ref Range: 98 - 111 mmol/L 106  CO2 Latest Ref Range: 22 - 32 mmol/L 28  Glucose Latest Ref Range: 70 - 99 mg/dL 496 (H)  BUN Latest Ref Range: 8 - 23 mg/dL 13  Creatinine Latest Ref Range: 0.44 - 1.00 mg/dL 7.59  Calcium Latest Ref Range: 8.9 - 10.3 mg/dL 9.4  Anion gap Latest Ref Range: 5 - 15  5  Alkaline Phosphatase Latest Ref Range: 38  - 126 U/L 158 (H)  Albumin Latest Ref Range: 3.5 - 5.0 g/dL 3.8  AST Latest Ref Range: 15 - 41 U/L 16  ALT Latest Ref Range: 0 - 44 U/L 15  Total Protein Latest Ref Range: 6.5 - 8.1 g/dL 7.9  Total Bilirubin Latest Ref Range: 0.3 - 1.2 mg/dL 0.5  GFR, Estimated Latest Ref Range: >60 mL/min >60   12/01/2019 TSH 1.7 uIU/mL  ASSESSMENT/PLAN/RECOMMENDATIONS:   1. Hyperthyroidism :  - Pt  is clinically euthyroid  - No local neck symptoms - Has been on methimazole since 2016. We briefly discussed RAI ablation . - Repeat labs today shows elevated TSH, will reduce   Medications : Decrease Methimazole 5 mg, half a tablet three tablets a week ( Monday, Wednesday and Saturday)   2. Graves' Disease:   No extra-thyroidal manifestations of graves' disease   F/U in 4 months   Addendum: discussed labs results and recommendations with the pt on 02/16/2020  Signed electronically by: Mack Guise, MD  [facility]  [facility] [address] [place], [place] [address] Phone: [phone] FAX: [phone]   CC: Ledell Noss [address] [place] [address] Phone: [phone] Fax: [phone]   Return to Endocrinology clinic as below: Future Appointments  Date Time Provider [place]  02/21/2020 11:45 AM Desenglau, Tommy Rainwater, PT [facility]  06/21/2020  1:00 PM Bari Handshoe, Melanie Crazier, MD [facility] None  12/21/2020 11:00 AM [facility] None  12/21/2020 11:30 AM Magrinat, Virgie Dad, MD [facility] None

## 2020-02-16 ENCOUNTER — Other Ambulatory Visit: Payer: Self-pay

## 2020-02-16 ENCOUNTER — Ambulatory Visit: Payer: Federal, State, Local not specified - PPO | Admitting: Internal Medicine

## 2020-02-16 ENCOUNTER — Encounter: Payer: Self-pay | Admitting: Internal Medicine

## 2020-02-16 VITALS — BP 124/82 | HR 68 | Ht 66.75 in | Wt 191.0 lb

## 2020-02-16 DIAGNOSIS — E0500 Thyrotoxicosis with diffuse goiter without thyrotoxic crisis or storm: Secondary | ICD-10-CM | POA: Diagnosis not present

## 2020-02-16 LAB — T4, FREE: Free T4: 1.05 ng/dL (ref 0.60–1.60)

## 2020-02-16 LAB — TSH: TSH: 4.68 u[IU]/mL — ABNORMAL HIGH (ref 0.35–4.50)

## 2020-02-16 MED ORDER — METHIMAZOLE 5 MG PO TABS: 2.5000 mg | ORAL_TABLET | ORAL | 1 refills | Status: DC

## 2020-02-16 NOTE — Patient Instructions (Signed)
-   Continue Methimazole 5 mg, half a tablet daily for now

## 2020-02-19 DIAGNOSIS — Z20822 Contact with and (suspected) exposure to covid-19: Secondary | ICD-10-CM | POA: Diagnosis not present

## 2020-02-21 ENCOUNTER — Ambulatory Visit: Payer: Federal, State, Local not specified - PPO | Attending: Oncology | Admitting: Physical Therapy

## 2020-02-21 ENCOUNTER — Other Ambulatory Visit: Payer: Self-pay

## 2020-02-21 ENCOUNTER — Encounter: Payer: Self-pay | Admitting: Physical Therapy

## 2020-02-21 DIAGNOSIS — M62838 Other muscle spasm: Secondary | ICD-10-CM | POA: Diagnosis not present

## 2020-02-21 DIAGNOSIS — M6281 Muscle weakness (generalized): Secondary | ICD-10-CM | POA: Insufficient documentation

## 2020-02-21 DIAGNOSIS — R279 Unspecified lack of coordination: Secondary | ICD-10-CM

## 2020-02-21 NOTE — Patient Instructions (Signed)
Moisturizers . They are used in the vagina to hydrate the mucous membrane that make up the vaginal canal. . Designed to keep a more normal acid balance (ph) . Once placed in the vagina, it will last between two to three days.  . Use 2-3 times per week at bedtime  . Ingredients to avoid is glycerin and fragrance, can increase chance of infection . Should not be used just before sex due to causing irritation . Most are gels administered either in a tampon-shaped applicator or as a vaginal suppository. They are non-hormonal.   Types of Moisturizers(internal use)  . Vitamin E vaginal suppositories- Whole foods, Amazon . Moist Again . Coconut oil- can break down condoms . Yes moisturizer- amazon . NeuEve Silk , NeuEve Silver for menopausal or over 65 (if have severe vaginal atrophy or cancer treatments use NeuEve Silk for  1 month than move to [employer])- [employer], MapleFlower.dk . Olive and Bee intimate cream- www.oliveandbee.com.au . Mae vaginal [place] . Aloe   Creams to use externally on the Vulva area  Albertson's (good for for cancer patients that had radiation to the area)- [place] or [employer].FlyingBasics.com.br  V-magic cream - amazon  Vital "V [place] salve ( help moisturize and help with thinning vulvar area, does have [place] by Irwin Brakeman labial moisturizer ([place],   Coconut or olive oil  aloe   Things to avoid in the vaginal area . Do not use things to irritate the vulvar area . No lotions just specialized creams for the vulva area- Neogyn, V-magic, No soaps; can use Aveeno or Calendula cleanser if needed. Must be gentle . No deodorants . No douches . Good to sleep without underwear to let the vaginal area to air out . No scrubbing: spread the lips to let warm water rinse over labias and pat dry   [facility] [address], [place], [place]  [address] Phone # [phone] Fax [phone]

## 2020-02-21 NOTE — Therapy (Signed)
[facility] [address] [place], [place], [address] Phone: [phone]   Fax:  [phone]  Physical Therapy Evaluation  Patient Details  Name: [name] MRN: [id number] Date of Birth: [birth year] Referring Provider (PT): Magrinat, Virgie Dad, MD   Encounter Date: 02/21/2020   PT End of Session - 02/21/20 1641    Visit Number 1    Date for PT Re-Evaluation 05/15/20    Authorization Type BCBS    PT Start Time 1148    PT Stop Time 1228    PT Time Calculation (min) 40 min    Activity Tolerance Patient tolerated treatment well    Behavior During Therapy [facility] for tasks assessed/performed           Past Medical History:  Diagnosis Date  . Anxiety   . Arthritis    neck  . Breast cancer ([place]) 2018   Right Breast Cancer  . DVT (deep venous thrombosis) ([place])    04/2010  . GERD (gastroesophageal reflux disease)   . Headache(784.0) 09/22/2012  . Heart murmur    dx in the 90s - said that it comes and goes - cardiologist said it was a mild murmur  . History of hiatal hernia   . History of radiation therapy 02/05/2017   02/05/17-03/05/17,  right breast 40.05 Gy in 15 fractions, right breast boost 10 Gy in 5 fractions  . Hyperlipidemia   . Hypertension   . Hyperthyroidism   . Iron deficiency anemia   . Palpitations   . Personal history of radiation therapy 2018   Right Breast Cancer  . Pulmonary embolism ([place]) 2010  . Uterine fibroid   . Vitamin D deficiency    Graves disease    Past Surgical History:  Procedure Laterality Date  . BREAST LUMPECTOMY Right 12/16/2016  . BREAST LUMPECTOMY WITH RADIOACTIVE SEED AND SENTINEL LYMPH NODE BIOPSY Right 12/16/2016   Procedure: RIGHT BREAST LUMPECTOMY WITH RADIOACTIVE SEED AND SENTINEL LYMPH NODE BIOPSY;  Surgeon: Rolm Bookbinder, MD;  Location: [facility];  Service: General;  Laterality: Right;  . CHOLECYSTECTOMY    . COLONOSCOPY    . DILATION AND CURETTAGE OF UTERUS      01/2008  . ESOPHAGOGASTRODUODENOSCOPY    . ivc filter      There were no vitals filed for this visit.    Subjective Assessment - 02/21/20 1149    Subjective Pt states she has been having possible vaginal dryness and spotting.  Pt states also having more urgency and cannot wait to use the bathroom.  Sometimes pain prevents from having intercourse.    Patient Stated Goals not having pain during intercourse; reduce urgency    Currently in Pain? No/denies              [facility] PT Assessment - 02/21/20 0001      Assessment   Medical Diagnosis C50.511,Z17.0 (ICD-10-CM) - Malignant neoplasm of lower-outer quadrant of right breast of female, estrogen receptor positive ([place])    Referring Provider (PT) Magrinat, Virgie Dad, MD    Prior Therapy No      Precautions   Precautions None      Balance Screen   Has the patient fallen in the past 6 months No      [place] residence    Living Arrangements Alone      Prior Function   Level of Independence Independent    Vocation Retired    Leisure walking; ballroom  Cognition   Overall Cognitive Status Within Functional Limits for tasks assessed      Posture/Postural Control   Posture/Postural Control Postural limitations    Postural Limitations Anterior pelvic tilt;Rounded Shoulders      ROM / Strength   AROM / PROM / Strength PROM;Strength      PROM   Overall PROM Comments Rt ER 40%; Lt 50%; IR 60%      Strength   Overall Strength Comments core weakness 4/5      Flexibility   Soft Tissue Assessment /Muscle Length yes    Hamstrings 70%   hip flexors 80%     Palpation   SI assessment  normal    Palpation comment tight lumbar and thoracic paraspinals, glutes, hamstrings      Ambulation/Gait   Gait Pattern Within Functional Limits                      Objective measurements completed on examination: See above findings.     Pelvic Floor Special Questions - 02/21/20  0001    Prior Pelvic/Prostate Exam Yes    Are you Pregnant or attempting pregnancy? No    Prior Pregnancies Yes   miscarriage   Currently Sexually Active Yes    Is this Painful Yes    Urinary Leakage Yes    Urinary urgency Yes    Fecal incontinence No   have to take probiotics some constipation   Fluid intake 16 oz bottle    Caffeine beverages tea and hot chocolate    Falling out feeling (prolapse) No    Skin Integrity --   dryness   Perineal Body/Introitus  Elevated   decreased bulge   Prolapse --   not noticed to be significant   Pelvic Floor Internal Exam pt identity confirmed and informed consent given    Exam Type Vaginal    Palpation tight with no tenderness; Lt vaginal canal with more restriction    Strength fair squeeze, definite lift    Strength # of reps --   8 quick in 10 s   Strength # of seconds 3    Tone high            [facility] Adult PT Treatment/Exercise - 02/21/20 0001      Self-Care   Self-Care Other Self-Care Comments    Other Self-Care Comments  urge and moisturizing                  PT Education - 02/21/20 1643    Education Details moisturizing and urge drills    Person(s) Educated Patient    Methods Explanation;Demonstration;Handout    Comprehension Verbalized understanding            PT Short Term Goals - 02/21/20 1643      PT SHORT TERM GOAL #1   Title ind with moisturizing and urge drills    Time 4    Period Weeks    Status New    Target Date 03/20/20      PT SHORT TERM GOAL #2   Title ind with initial stretches    Time 4    Period Weeks    Status New    Target Date 03/20/20      PT SHORT TERM GOAL #3   Title pt will report 50% less pain with intercourse    Time 4    Period Weeks    Status New    Target Date 03/20/20  PT Long Term Goals - 02/21/20 1644      PT LONG TERM GOAL #1   Title pt will be ind with advanced HEP    Time 12    Period Weeks    Status New    Target Date 05/15/20      PT LONG  TERM GOAL #2   Title Pt will be able to contract and sustain pelvic floor contraction for at least 15 seconds for improved ability to reduce urge and leakage    Time 12    Period Weeks    Status New    Target Date 05/15/20      PT LONG TERM GOAL #3   Title Pt will report no pain with intercourse due to improved pelvic floor health    Time 12    Period Weeks    Status New    Target Date 05/15/20      PT LONG TERM GOAL #4   Title Pt will report improved H2O intake of at least 4 bottles (16 oz each) per day.    Baseline 1 bottle/day    Time 12    Period Weeks    Status New    Target Date 05/15/20                  Plan - 02/21/20 1651    Clinical Impression Statement Pt presents to skilled PT due to vaginal spotting and pain with intercourse.  Pt being treated by OBGYN to assess spotting but feels it may be due to intercourse or vaginal dryness on occasion. Pt is breast cancer survivor on estrogen blocking meds.  Pt has posture abnormalities and muscle restrictions throughout trunk and LE as noted above.  Pt has posutre abnormalities and weakness in core.  Pt does have dryness observed externally and pelvic floor weakness 3/5 with low endurance of 3 sec before starting to have trouble holding . Pt also has high tone and history of constipation.  Pt was educated on drinking more water which she is drinking very minimally right now.  Pt will benefit from skilled PT to address afore mentioned impairments with special attention to mechanics during funcitonal activiites and core posture and strength as well as lifestyle changes to manage symptoms associated with current meds.    Personal Factors and Comorbidities Comorbidity 3+    Comorbidities breast cancer; hx constipation; GERD; anxiety    Examination-Activity Limitations Continence;Toileting    Examination-Participation Restrictions Interpersonal Relationship;Community Activity    Stability/Clinical Decision Making Evolving/Moderate  complexity    Clinical Decision Making Moderate    Rehab Potential Excellent    PT Frequency 1x / week    PT Duration 12 weeks    PT Treatment/Interventions ADLs/Self Care Home Management;Biofeedback;Cryotherapy;Electrical Stimulation;Moist Heat;Therapeutic activities;Therapeutic exercise;Neuromuscular re-education;Patient/family education;Manual techniques;Passive range of motion;Balance training;Dry needling;Taping    PT Next Visit Plan lumbar and gluteal STM and stretches; stretches with breathing; f/u on urge and add toileting techniques    PT Home Exercise Plan moisture and urge given at eval    Consulted and Agree with Plan of Care Patient           Patient will benefit from skilled therapeutic intervention in order to improve the following deficits and impairments:  Decreased endurance,Pain,Postural dysfunction,Increased fascial restricitons,Impaired flexibility,Decreased strength,Decreased coordination,Decreased range of motion,Increased muscle spasms  Visit Diagnosis: Other muscle spasm  Muscle weakness (generalized)  Unspecified lack of coordination     Problem List Patient Active Problem List   Diagnosis Date Noted  .  Dyslipidemia 11/08/2018  . History of DVT (deep vein thrombosis) 02/16/2018  . History of breast cancer 02/16/2018  . Pulmonary emboli ([place]) 11/20/2016  . Malignant neoplasm of lower-outer quadrant of right breast of female, estrogen receptor positive ([place]) 11/19/2016  . Graves disease 10/19/2014  . Palpitation 09/24/2013  . Headache(784.0) 09/22/2012  . Dysarthria 09/22/2012  . LEG PAIN, RIGHT 04/22/2008  . VITAMIN B12 DEFICIENCY 04/19/2008  . ANEMIA, IRON DEFICIENCY 04/19/2008  . HYPERLIPIDEMIA 03/30/2008  . HYPERTENSION 03/30/2008  . ALLERGIC RHINITIS 03/30/2008  . GERD 03/30/2008    Jule Ser, PT 02/21/2020, 5:05 PM   [facility] [address] [place], [place],  [address] Phone: [phone]   Fax:  [phone]  Name: SHADY PADRON MRN: [id number] Date of Birth: April 13, 1955

## 2020-02-29 ENCOUNTER — Encounter: Payer: Federal, State, Local not specified - PPO | Admitting: Physical Therapy

## 2020-03-07 ENCOUNTER — Encounter: Payer: Self-pay | Admitting: Physical Therapy

## 2020-03-07 ENCOUNTER — Other Ambulatory Visit: Payer: Self-pay

## 2020-03-07 ENCOUNTER — Ambulatory Visit: Payer: Federal, State, Local not specified - PPO | Admitting: Physical Therapy

## 2020-03-07 DIAGNOSIS — R279 Unspecified lack of coordination: Secondary | ICD-10-CM

## 2020-03-07 DIAGNOSIS — M62838 Other muscle spasm: Secondary | ICD-10-CM | POA: Diagnosis not present

## 2020-03-07 DIAGNOSIS — M6281 Muscle weakness (generalized): Secondary | ICD-10-CM

## 2020-03-07 NOTE — Patient Instructions (Signed)
Access Code: [email or web address] URL: [email or web address] Date: 03/07/2020 Prepared by: Jari Favre  Exercises Supine Figure 4 Piriformis Stretch - 1 x daily - 7 x weekly - 1 sets - 3 reps - 30 sec hold Supine Single Knee to Chest Stretch - 2 x daily - 7 x weekly - 1 sets - 5 reps - 10 sec hold Supine Hamstring Stretch - 1 x daily - 7 x weekly - 1 sets - 3 reps - 30 sec hold   Make marble sized ball with coconut oil and freeze to insert vaginally

## 2020-03-08 NOTE — Therapy (Signed)
[facility] [address], [place], [place], [address] Phone: [phone]   Fax:  [phone]  Physical Therapy Treatment  Patient Details  Name: [name] MRN: [id number] Date of Birth: January 15, 1956 Referring Provider (PT): Magrinat, Virgie Dad, MD   Encounter Date: 03/07/2020   PT End of Session - 03/07/20 1611    Visit Number 2    Date for PT Re-Evaluation 05/15/20    Authorization Type BCBS    PT Start Time 1534    PT Stop Time 1612    PT Time Calculation (min) 38 min    Activity Tolerance Patient tolerated treatment well    Behavior During Therapy [facility] for tasks assessed/performed           Past Medical History:  Diagnosis Date  . Anxiety   . Arthritis    neck  . Breast cancer ([place]) 2018   Right Breast Cancer  . DVT (deep venous thrombosis) ([place])    04/2010  . GERD (gastroesophageal reflux disease)   . Headache(784.0) 09/22/2012  . Heart murmur    dx in the 90s - said that it comes and goes - cardiologist said it was a mild murmur  . History of hiatal hernia   . History of radiation therapy 02/05/2017   02/05/17-03/05/17,  right breast 40.05 Gy in 15 fractions, right breast boost 10 Gy in 5 fractions  . Hyperlipidemia   . Hypertension   . Hyperthyroidism   . Iron deficiency anemia   . Palpitations   . Personal history of radiation therapy 2018   Right Breast Cancer  . Pulmonary embolism ([place]) 2010  . Uterine fibroid   . Vitamin D deficiency    Graves disease    Past Surgical History:  Procedure Laterality Date  . BREAST LUMPECTOMY Right 12/16/2016  . BREAST LUMPECTOMY WITH RADIOACTIVE SEED AND SENTINEL LYMPH NODE BIOPSY Right 12/16/2016   Procedure: RIGHT BREAST LUMPECTOMY WITH RADIOACTIVE SEED AND SENTINEL LYMPH NODE BIOPSY;  Surgeon: Rolm Bookbinder, MD;  Location: [facility];  Service: General;  Laterality: Right;  . CHOLECYSTECTOMY    . COLONOSCOPY    . DILATION AND CURETTAGE OF UTERUS      01/2008  . ESOPHAGOGASTRODUODENOSCOPY    . ivc filter      There were no vitals filed for this visit.   Subjective Assessment - 03/07/20 1538    Subjective Pt states nothing has changed. have not been using coconut oil.  I have done the urge drills and that is okay    Patient Stated Goals not having pain during intercourse; reduce urgency    Currently in Pain? No/denies                             [facility] Adult PT Treatment/Exercise - 03/08/20 0001      Neuro Re-ed    Neuro Re-ed Details  breathing with exercises      Exercises   Exercises Lumbar      Lumbar Exercises: Stretches   Active Hamstring Stretch Right;Left;2 reps;20 seconds    Single Knee to Chest Stretch 5 reps    Double Knee to Chest Stretch 5 reps    Lower Trunk Rotation 5 reps    Figure 4 Stretch 3 reps      Manual Therapy   Manual Therapy Soft tissue mobilization    Soft tissue mobilization lumbar and thoracic paraspinals and gluteals  PT Education - 03/07/20 1610    Education Details Access Code: [email or web address]    Person(s) Educated Patient    Methods Explanation;Demonstration;Handout;Verbal cues;Tactile cues    Comprehension Verbalized understanding;Returned demonstration;Verbal cues required;Tactile cues required            PT Short Term Goals - 02/21/20 1643      PT SHORT TERM GOAL #1   Title ind with moisturizing and urge drills    Time 4    Period Weeks    Status New    Target Date 03/20/20      PT SHORT TERM GOAL #2   Title ind with initial stretches    Time 4    Period Weeks    Status New    Target Date 03/20/20      PT SHORT TERM GOAL #3   Title pt will report 50% less pain with intercourse    Time 4    Period Weeks    Status New    Target Date 03/20/20             PT Long Term Goals - 02/21/20 1644      PT LONG TERM GOAL #1   Title pt will be ind with advanced HEP    Time 12    Period Weeks    Status New    Target Date  05/15/20      PT LONG TERM GOAL #2   Title Pt will be able to contract and sustain pelvic floor contraction for at least 15 seconds for improved ability to reduce urge and leakage    Time 12    Period Weeks    Status New    Target Date 05/15/20      PT LONG TERM GOAL #3   Title Pt will report no pain with intercourse due to improved pelvic floor health    Time 12    Period Weeks    Status New    Target Date 05/15/20      PT LONG TERM GOAL #4   Title Pt will report improved H2O intake of at least 4 bottles (16 oz each) per day.    Baseline 1 bottle/day    Time 12    Period Weeks    Status New    Target Date 05/15/20                 Plan - 03/08/20 1206    Clinical Impression Statement Pt has been using the urge techniques with some success.  Pt has not been doing moisturizing and no noticeable difference with pain.  Pt has a lot of tension throughout low back and gluteals so today's session focused on address these areas in order to work on better breathing into pelvic floor with improved posture.  Pt responded well and felt muscle release after today's treatment.  Pt was given stretches to continue to maintain muscle length gained during today's session.    PT Treatment/Interventions ADLs/Self Care Home Management;Biofeedback;Cryotherapy;Electrical Stimulation;Moist Heat;Therapeutic activities;Therapeutic exercise;Neuromuscular re-education;Patient/family education;Manual techniques;Passive range of motion;Balance training;Dry needling;Taping    PT Next Visit Plan f/u on HEP, inernal STM f/u on using coconut oil    PT Home Exercise Plan moisture and urge given at eval; Access Code: [email or web address]    Consulted and Agree with Plan of Care Patient           Patient will benefit from skilled therapeutic intervention in order to improve the following deficits and impairments:  Decreased endurance,Pain,Postural dysfunction,Increased  fascial restricitons,Impaired flexibility,Decreased  strength,Decreased coordination,Decreased range of motion,Increased muscle spasms  Visit Diagnosis: Other muscle spasm  Muscle weakness (generalized)  Unspecified lack of coordination     Problem List Patient Active Problem List   Diagnosis Date Noted  . Dyslipidemia 11/08/2018  . History of DVT (deep vein thrombosis) 02/16/2018  . History of breast cancer 02/16/2018  . Pulmonary emboli ([place]) 11/20/2016  . Malignant neoplasm of lower-outer quadrant of right breast of female, estrogen receptor positive ([place]) 11/19/2016  . Graves disease 10/19/2014  . Palpitation 09/24/2013  . Headache(784.0) 09/22/2012  . Dysarthria 09/22/2012  . LEG PAIN, RIGHT 04/22/2008  . VITAMIN B12 DEFICIENCY 04/19/2008  . ANEMIA, IRON DEFICIENCY 04/19/2008  . HYPERLIPIDEMIA 03/30/2008  . HYPERTENSION 03/30/2008  . ALLERGIC RHINITIS 03/30/2008  . GERD 03/30/2008    Jule Ser, PT 03/08/2020, 12:21 PM  [facility] [address], [place], [place], [address] Phone: [phone]   Fax:  [phone]  Name: [name] MRN: [id number] Date of Birth: 05/22/1955

## 2020-03-10 DIAGNOSIS — N760 Acute vaginitis: Secondary | ICD-10-CM | POA: Diagnosis not present

## 2020-03-14 ENCOUNTER — Encounter: Payer: Self-pay | Admitting: Physical Therapy

## 2020-03-14 ENCOUNTER — Other Ambulatory Visit: Payer: Self-pay

## 2020-03-14 ENCOUNTER — Ambulatory Visit: Payer: Federal, State, Local not specified - PPO | Attending: Oncology | Admitting: Physical Therapy

## 2020-03-14 DIAGNOSIS — M62838 Other muscle spasm: Secondary | ICD-10-CM

## 2020-03-14 DIAGNOSIS — R279 Unspecified lack of coordination: Secondary | ICD-10-CM | POA: Diagnosis not present

## 2020-03-14 DIAGNOSIS — M6281 Muscle weakness (generalized): Secondary | ICD-10-CM

## 2020-03-14 NOTE — Therapy (Addendum)
[facility] [address], [place], [place], [address] Phone: [phone]   Fax:  [phone]  Physical Therapy Treatment  Patient Details  Name: [name] MRN: [id number] Date of Birth: [birth year] Referring Provider (PT): Magrinat, Virgie Dad, MD   Encounter Date: 03/14/2020   PT End of Session - 03/14/20 1732     Visit Number 3    Date for PT Re-Evaluation 05/15/20    Authorization Type BCBS    PT Start Time 1538   arrived late   PT Stop Time 1618    PT Time Calculation (min) 40 min    Activity Tolerance Patient tolerated treatment well    Behavior During Therapy [facility] for tasks assessed/performed             Past Medical History:  Diagnosis Date   Anxiety    Arthritis    neck   Breast cancer ([place]) 2018   Right Breast Cancer   DVT (deep venous thrombosis) ([place])    04/2010   GERD (gastroesophageal reflux disease)    Headache(784.0) 09/22/2012   Heart murmur    dx in the 90s - said that it comes and goes - cardiologist said it was a mild murmur   History of hiatal hernia    History of radiation therapy 02/05/2017   02/05/17-03/05/17,  right breast 40.05 Gy in 15 fractions, right breast boost 10 Gy in 5 fractions   Hyperlipidemia    Hypertension    Hyperthyroidism    Iron deficiency anemia    Palpitations    Personal history of radiation therapy 2018   Right Breast Cancer   Pulmonary embolism ([place]) 2010   Uterine fibroid    Vitamin D deficiency    Graves disease    Past Surgical History:  Procedure Laterality Date   BREAST LUMPECTOMY Right 12/16/2016   BREAST LUMPECTOMY WITH RADIOACTIVE SEED AND SENTINEL LYMPH NODE BIOPSY Right 12/16/2016   Procedure: RIGHT BREAST LUMPECTOMY WITH RADIOACTIVE SEED AND SENTINEL LYMPH NODE BIOPSY;  Surgeon: Rolm Bookbinder, MD;  Location: [facility];  Service: General;  Laterality: Right;   CHOLECYSTECTOMY     COLONOSCOPY     DILATION AND CURETTAGE OF UTERUS      01/2008   ESOPHAGOGASTRODUODENOSCOPY     ivc filter      There were no vitals filed for this visit.   Subjective Assessment - 03/14/20 1542     Subjective Pt states still about the same, she has not used the coconut oil yet    Patient Stated Goals not having pain during intercourse; reduce urgency    Currently in Pain? No/denies                               [facility] Adult PT Treatment/Exercise - 03/14/20 0001       Manual Therapy   Manual Therapy Myofascial release;Internal Pelvic Floor    Manual therapy comments pt identity confirmed and internal soft tissue release with informed consent    Myofascial Release descending colon, rectum and bladder release more on left side    Internal Pelvic Floor mobs to urethra; stretch bulbocav and levators, fascial release down both sides vaginal canal                      PT Short Term Goals - 03/14/20 1737       PT SHORT TERM GOAL #1  Title ind with moisturizing and urge drills    Status Achieved      PT SHORT TERM GOAL #2   Title ind with initial stretches    Status Achieved      PT SHORT TERM GOAL #3   Title pt will report 50% less pain with intercourse    Baseline not doing this at the moment    Status Deferred               PT Long Term Goals - 02/21/20 1644       PT LONG TERM GOAL #1   Title pt will be ind with advanced HEP    Time 12    Period Weeks    Status New    Target Date 05/15/20      PT LONG TERM GOAL #2   Title Pt will be able to contract and sustain pelvic floor contraction for at least 15 seconds for improved ability to reduce urge and leakage    Time 12    Period Weeks    Status New    Target Date 05/15/20      PT LONG TERM GOAL #3   Title Pt will report no pain with intercourse due to improved pelvic floor health    Time 12    Period Weeks    Status New    Target Date 05/15/20      PT LONG TERM GOAL #4   Title Pt will report improved H2O intake of at least  4 bottles (16 oz each) per day.    Baseline 1 bottle/day    Time 12    Period Weeks    Status New    Target Date 05/15/20                   Plan - 03/14/20 1733     Clinical Impression Statement Today's session focused on soft tissue release of the pelvic floor.  Pt had no pain during or after treatment and PT monitored and checked in throughout. Pt has tension in levators Lt>Rt and needed cues to breath in order to relax.  Pt was holding a lot of tension and tenses up intermittently when talking throughout the session.  She did well with breathing and focus on relaxing the pelvic floor.  Pt also had good fascial release on left lower abdomen around the descending colon and around rectum and bladder on the left side.  Pt will benefit from skilled PT to continue to address restrictions and work on soft tissue length for reduced urgency and so she can regain ability to have intercourse when she decides to do so.    Comorbidities breast cancer; hx constipation; GERD; anxiety    PT Treatment/Interventions ADLs/Self Care Home Management;Biofeedback;Cryotherapy;Electrical Stimulation;Moist Heat;Therapeutic activities;Therapeutic exercise;Neuromuscular re-education;Patient/family education;Manual techniques;Passive range of motion;Balance training;Dry needling;Taping    PT Next Visit Plan f/u on changes with internal STM; review stretches and breathing as needed, internal STM    PT Home Exercise Plan moisture and urge given at eval; Access Code: [email or web address]    Consulted and Agree with Plan of Care Patient             Patient will benefit from skilled therapeutic intervention in order to improve the following deficits and impairments:  Decreased endurance,Pain,Postural dysfunction,Increased fascial restricitons,Impaired flexibility,Decreased strength,Decreased coordination,Decreased range of motion,Increased muscle spasms  Visit Diagnosis: Other muscle spasm  Muscle weakness  (generalized)  Unspecified lack of coordination     Problem List Patient  Active Problem List   Diagnosis Date Noted   Dyslipidemia 11/08/2018   History of DVT (deep vein thrombosis) 02/16/2018   History of breast cancer 02/16/2018   Pulmonary emboli ([place]) 11/20/2016   Malignant neoplasm of lower-outer quadrant of right breast of female, estrogen receptor positive ([place]) 11/19/2016   Graves disease 10/19/2014   Palpitation 09/24/2013   Headache(784.0) 09/22/2012   Dysarthria 09/22/2012   LEG PAIN, RIGHT 04/22/2008   VITAMIN B12 DEFICIENCY 04/19/2008   ANEMIA, IRON DEFICIENCY 04/19/2008   HYPERLIPIDEMIA 03/30/2008   HYPERTENSION 03/30/2008   ALLERGIC RHINITIS 03/30/2008   GERD 03/30/2008    Camillo Flaming Diahann Guajardo, PT 03/14/2020, 5:43 PM  [facility] [address] [place], [place], [address] Phone: [phone]   Fax:  [phone]  Name: [name] MRN: [id number] Date of Birth: Apr 08, 1955  PHYSICAL THERAPY DISCHARGE SUMMARY  Visits from Start of Care: 3  Current functional level related to goals / functional outcomes: See above goals   Remaining deficits: See above   Education / Equipment: HEP   Patient agrees to discharge. Patient goals were not met. Patient is being discharged due to the patient's request.  Gustavus Bryant, PT 08/04/20 11:47 AM

## 2020-03-22 DIAGNOSIS — M25532 Pain in left wrist: Secondary | ICD-10-CM | POA: Diagnosis not present

## 2020-03-22 DIAGNOSIS — M25519 Pain in unspecified shoulder: Secondary | ICD-10-CM | POA: Diagnosis not present

## 2020-03-27 DIAGNOSIS — M25532 Pain in left wrist: Secondary | ICD-10-CM | POA: Diagnosis not present

## 2020-03-27 DIAGNOSIS — M19012 Primary osteoarthritis, left shoulder: Secondary | ICD-10-CM | POA: Diagnosis not present

## 2020-03-28 ENCOUNTER — Encounter: Payer: Federal, State, Local not specified - PPO | Admitting: Physical Therapy

## 2020-03-29 DIAGNOSIS — M25612 Stiffness of left shoulder, not elsewhere classified: Secondary | ICD-10-CM | POA: Diagnosis not present

## 2020-03-29 DIAGNOSIS — M25512 Pain in left shoulder: Secondary | ICD-10-CM | POA: Diagnosis not present

## 2020-03-29 DIAGNOSIS — M19012 Primary osteoarthritis, left shoulder: Secondary | ICD-10-CM | POA: Diagnosis not present

## 2020-03-29 DIAGNOSIS — M6281 Muscle weakness (generalized): Secondary | ICD-10-CM | POA: Diagnosis not present

## 2020-04-04 ENCOUNTER — Encounter: Payer: Federal, State, Local not specified - PPO | Admitting: Physical Therapy

## 2020-04-10 DIAGNOSIS — M19012 Primary osteoarthritis, left shoulder: Secondary | ICD-10-CM | POA: Diagnosis not present

## 2020-04-11 ENCOUNTER — Encounter: Payer: Federal, State, Local not specified - PPO | Admitting: Physical Therapy

## 2020-04-12 DIAGNOSIS — M25612 Stiffness of left shoulder, not elsewhere classified: Secondary | ICD-10-CM | POA: Diagnosis not present

## 2020-04-12 DIAGNOSIS — M19012 Primary osteoarthritis, left shoulder: Secondary | ICD-10-CM | POA: Diagnosis not present

## 2020-04-12 DIAGNOSIS — M6281 Muscle weakness (generalized): Secondary | ICD-10-CM | POA: Diagnosis not present

## 2020-04-12 DIAGNOSIS — M25512 Pain in left shoulder: Secondary | ICD-10-CM | POA: Diagnosis not present

## 2020-04-18 ENCOUNTER — Encounter: Payer: Federal, State, Local not specified - PPO | Admitting: Physical Therapy

## 2020-04-21 DIAGNOSIS — M19012 Primary osteoarthritis, left shoulder: Secondary | ICD-10-CM | POA: Diagnosis not present

## 2020-04-21 DIAGNOSIS — M25612 Stiffness of left shoulder, not elsewhere classified: Secondary | ICD-10-CM | POA: Diagnosis not present

## 2020-04-21 DIAGNOSIS — M25512 Pain in left shoulder: Secondary | ICD-10-CM | POA: Diagnosis not present

## 2020-04-21 DIAGNOSIS — M6281 Muscle weakness (generalized): Secondary | ICD-10-CM | POA: Diagnosis not present

## 2020-04-25 ENCOUNTER — Encounter: Payer: Federal, State, Local not specified - PPO | Admitting: Physical Therapy

## 2020-04-28 DIAGNOSIS — M25612 Stiffness of left shoulder, not elsewhere classified: Secondary | ICD-10-CM | POA: Diagnosis not present

## 2020-04-28 DIAGNOSIS — M19012 Primary osteoarthritis, left shoulder: Secondary | ICD-10-CM | POA: Diagnosis not present

## 2020-04-28 DIAGNOSIS — M25512 Pain in left shoulder: Secondary | ICD-10-CM | POA: Diagnosis not present

## 2020-04-28 DIAGNOSIS — M6281 Muscle weakness (generalized): Secondary | ICD-10-CM | POA: Diagnosis not present

## 2020-05-03 DIAGNOSIS — M6281 Muscle weakness (generalized): Secondary | ICD-10-CM | POA: Diagnosis not present

## 2020-05-03 DIAGNOSIS — M25612 Stiffness of left shoulder, not elsewhere classified: Secondary | ICD-10-CM | POA: Diagnosis not present

## 2020-05-03 DIAGNOSIS — M25512 Pain in left shoulder: Secondary | ICD-10-CM | POA: Diagnosis not present

## 2020-05-03 DIAGNOSIS — M19012 Primary osteoarthritis, left shoulder: Secondary | ICD-10-CM | POA: Diagnosis not present

## 2020-05-10 DIAGNOSIS — M25512 Pain in left shoulder: Secondary | ICD-10-CM | POA: Diagnosis not present

## 2020-05-10 DIAGNOSIS — M25612 Stiffness of left shoulder, not elsewhere classified: Secondary | ICD-10-CM | POA: Diagnosis not present

## 2020-05-10 DIAGNOSIS — M6281 Muscle weakness (generalized): Secondary | ICD-10-CM | POA: Diagnosis not present

## 2020-05-10 DIAGNOSIS — M19012 Primary osteoarthritis, left shoulder: Secondary | ICD-10-CM | POA: Diagnosis not present

## 2020-05-24 DIAGNOSIS — E0590 Thyrotoxicosis, unspecified without thyrotoxic crisis or storm: Secondary | ICD-10-CM | POA: Diagnosis not present

## 2020-05-24 DIAGNOSIS — E785 Hyperlipidemia, unspecified: Secondary | ICD-10-CM | POA: Diagnosis not present

## 2020-05-24 DIAGNOSIS — I10 Essential (primary) hypertension: Secondary | ICD-10-CM | POA: Diagnosis not present

## 2020-05-24 DIAGNOSIS — M6281 Muscle weakness (generalized): Secondary | ICD-10-CM | POA: Diagnosis not present

## 2020-05-24 DIAGNOSIS — Z13228 Encounter for screening for other metabolic disorders: Secondary | ICD-10-CM | POA: Diagnosis not present

## 2020-05-24 DIAGNOSIS — R002 Palpitations: Secondary | ICD-10-CM | POA: Diagnosis not present

## 2020-06-08 DIAGNOSIS — R072 Precordial pain: Secondary | ICD-10-CM

## 2020-06-08 HISTORY — DX: Precordial pain: R07.2

## 2020-06-08 NOTE — Progress Notes (Signed)
Cardiology Office Note   Date:  06/09/2020   ID:  Laura, Allison [birth year], MRN [id number]  PCP:  Shanon Rosser, PA-C  Cardiologist:   Minus Breeding, MD    Chief Complaint  Patient presents with  . Palpitations     History of Present Illness: [name] is a 65 y.o. female who presents for follow up of palpitations.  I saw her last in 2019  She has had palpitations in the past and reports a treadmill test years ago in [place].   She has a history of DVT/PE and IVC filter several years ago.    She has had palpitations for about 6 months.  These are sporadic but they seem to be happening with more frequency.  She notices them more when she is lying down.  She feels like her heart is racing.  It last for about a minute.  She has some racing up into her neck.  She does not get presyncope or syncope.  She does not have any shortness of breath, PND or orthopnea.  She has had no chest pressure, neck or arm discomfort.  She has had thyroid issues and is having this actively managed.  She recently had her methimazole reduced and she thinks her symptoms might be a little worse since then.   Past Medical History:  Diagnosis Date  . Anxiety   . Arthritis    neck  . Breast cancer ([place]) 2018   Right Breast Cancer  . DVT (deep venous thrombosis) ([place])    04/2010  . GERD (gastroesophageal reflux disease)   . Headache(784.0) 09/22/2012  . History of hiatal hernia   . History of radiation therapy 02/05/2017   02/05/17-03/05/17,  right breast 40.05 Gy in 15 fractions, right breast boost 10 Gy in 5 fractions  . Hyperlipidemia   . Hypertension   . Hyperthyroidism   . Iron deficiency anemia   . Palpitations   . Personal history of radiation therapy 2018   Right Breast Cancer  . Pulmonary embolism ([place]) 2010  . Uterine fibroid   . Vitamin D deficiency    Graves disease    Past Surgical History:  Procedure Laterality Date  . BREAST LUMPECTOMY Right 12/16/2016  . BREAST  LUMPECTOMY WITH RADIOACTIVE SEED AND SENTINEL LYMPH NODE BIOPSY Right 12/16/2016   Procedure: RIGHT BREAST LUMPECTOMY WITH RADIOACTIVE SEED AND SENTINEL LYMPH NODE BIOPSY;  Surgeon: Rolm Bookbinder, MD;  Location: [facility];  Service: General;  Laterality: Right;  . CHOLECYSTECTOMY    . COLONOSCOPY    . DILATION AND CURETTAGE OF UTERUS     01/2008  . ESOPHAGOGASTRODUODENOSCOPY    . ivc filter       Current Outpatient Medications  Medication Sig Dispense Refill  . anastrozole (ARIMIDEX) 1 MG tablet Take 1 tablet (1 mg total) by mouth daily. 90 tablet 4  . Ascorbic Acid (VITAMIN C) 1000 MG tablet Take 1,000 mg by mouth daily.    Marland Kitchen aspirin EC 81 MG tablet Take 1 tablet (81 mg total) by mouth daily. 90 tablet 3  . candesartan (ATACAND) 4 MG tablet     . Cholecalciferol (VITAMIN D-3 PO) Take 1 capsule by mouth daily.    . methimazole (TAPAZOLE) 5 MG tablet Take 0.5 tablets (2.5 mg total) by mouth as directed. Half a tablet three days  A week 36 tablet 1  . Omega-3 Fatty Acids (FISH OIL) 1200 MG CAPS Take 1 capsule by mouth daily.    Marland Kitchen  Polyethyl Glycol-Propyl Glycol (SYSTANE OP) Place 1 drop into both eyes daily as needed (dry eyes).      No current facility-administered medications for this visit.    Allergies:   Amoxicillin-pot clavulanate and Niacin-lovastatin er    ROS:  Please see the history of present illness.   Otherwise, review of systems are positive for none.   All other systems are reviewed and negative.    PHYSICAL EXAM: VS:  BP 124/84 (BP Location: Left Arm, Patient Position: Sitting, Cuff Size: Normal)   Pulse 76   Ht 5' 6.75" (1.695 m)   Wt 196 lb (88.9 kg)   LMP 06/18/2010   BMI 30.93 kg/m  , BMI Body mass index is 30.93 kg/m.  GENERAL:  Well appearing NECK:  No jugular venous distention, waveform within normal limits, carotid upstroke brisk and symmetric, no bruits, no thyromegaly LUNGS:  Clear to auscultation bilaterally CHEST:  Unremarkable HEART:  PMI not  displaced or sustained,S1 and S2 within normal limits, no S3, no S4, no clicks, no rubs, very brief right upper sternal border systolic murmur, no diastolic murmurs ABD:  Flat, positive bowel sounds normal in frequency in pitch, no bruits, no rebound, no guarding, no midline pulsatile mass, no hepatomegaly, no splenomegaly EXT:  2 plus pulses throughout, no edema, no cyanosis no clubbing     EKG:  EKG is  ordered today. Sinus rhythm, rate 59, axis within normal limits, intervals within normal limits, nonspecific anterior T wave inversions.  I did compare this to previous EKGs and it was unchanged.  Recent Labs: 12/21/2019: ALT 15; BUN 13; Creatinine, Ser 0.90; Hemoglobin 12.1; Platelets 171; Potassium 4.4; Sodium 139 02/16/2020: TSH 4.68    Lipid Panel    Component Value Date/Time   CHOL 152 03/21/2017 1141   TRIG 63 03/21/2017 1141   HDL 49 03/21/2017 1141   CHOLHDL 3.1 03/21/2017 1141   CHOLHDL 4.4 04/06/2010 1037   VLDL 16 04/06/2010 1037   LDLCALC 90 03/21/2017 1141      Wt Readings from Last 3 Encounters:  06/09/20 196 lb (88.9 kg)  02/16/20 191 lb (86.6 kg)  12/21/19 188 lb 12.8 oz (85.6 kg)      Other studies Reviewed: Additional studies/ records that were reviewed today include:    EKG from primary care office Review of the above records demonstrates: See elsewhere   ASSESSMENT AND PLAN:  PALPITATIONS: I plan to apply a 2-week monitor.  Further evaluation will be based on these.  I do suspect that she may be having some palpitations related to her thyroid which is being actively managed.  Perhaps this will be less problematic when she is completely euthyroid.  HTN:   Her blood pressure is well controlled.  No change in therapy.  DVT:    She was treated with an adequate course of anticoagulation.  No change in therapy.  No further symptoms suggestive of recurrent DVTs.  Current medicines are reviewed at length with the patient today.  The patient does not have  concerns regarding medicines.  The following changes have been made:   None  Labs/ tests ordered today include:     Orders Placed This Encounter  Procedures  . LONG TERM MONITOR (3-14 DAYS)  . EKG 12-Lead     Disposition:   FU with me as needed based on results of the monitor or future symptoms.   Signed, Minus Breeding, MD  06/09/2020 10:01 AM    [facility]

## 2020-06-09 ENCOUNTER — Ambulatory Visit: Payer: Federal, State, Local not specified - PPO | Admitting: Cardiology

## 2020-06-09 ENCOUNTER — Encounter: Payer: Self-pay | Admitting: Cardiology

## 2020-06-09 ENCOUNTER — Other Ambulatory Visit: Payer: Self-pay

## 2020-06-09 ENCOUNTER — Ambulatory Visit (INDEPENDENT_AMBULATORY_CARE_PROVIDER_SITE_OTHER): Payer: Federal, State, Local not specified - PPO

## 2020-06-09 VITALS — BP 124/84 | HR 76 | Ht 66.75 in | Wt 196.0 lb

## 2020-06-09 DIAGNOSIS — R072 Precordial pain: Secondary | ICD-10-CM

## 2020-06-09 DIAGNOSIS — R002 Palpitations: Secondary | ICD-10-CM

## 2020-06-09 DIAGNOSIS — E785 Hyperlipidemia, unspecified: Secondary | ICD-10-CM

## 2020-06-09 DIAGNOSIS — I10 Essential (primary) hypertension: Secondary | ICD-10-CM | POA: Diagnosis not present

## 2020-06-09 NOTE — Patient Instructions (Signed)
Medication Instructions:  Continue current medications  *If you need a refill on your cardiac medications before your next appointment, please call your pharmacy*   Lab Work: None Ordered   Testing/Procedures: Your physician has recommended that you wear a 2 weeks Zio monitor. Holter monitors are medical devices that record the heart's electrical activity. Doctors most often use these monitors to diagnose arrhythmias. Arrhythmias are problems with the speed or rhythm of the heartbeat. The monitor is a small, portable device. You can wear one while you do your normal daily activities. This is usually used to diagnose what is causing palpitations/syncope (passing out).   Follow-Up: At [facility], you and your health needs are our priority.  As part of our continuing mission to provide you with exceptional heart care, we have created designated Provider Care Teams.  These Care Teams include your primary Cardiologist (physician) and Advanced Practice Providers (APPs -  Physician Assistants and Nurse Practitioners) who all work together to provide you with the care you need, when you need it.  We recommend signing up for the patient portal called "MyChart".  Sign up information is provided on this After Visit Summary.  MyChart is used to connect with patients for Virtual Visits (Telemedicine).  Patients are able to view lab/test results, encounter notes, upcoming appointments, etc.  Non-urgent messages can be sent to your provider as well.   To learn more about what you can do with MyChart, go to [email or web address].    Your next appointment:   As Needed  Other Instructions  ZIO XT- Long Term Monitor Instructions   Your physician has requested you wear a ZIO patch monitor for 14  days.  This is a single patch monitor.   [employer] supplies one patch monitor per enrollment. Additional stickers are not available. Please do not apply patch if you will be having a Nuclear Stress Test,  Echocardiogram, Cardiac CT, MRI, or Chest Xray during the period you would be wearing the monitor. The patch cannot be worn during these tests. You cannot remove and re-apply the ZIO XT patch monitor.  Your ZIO patch monitor will be sent Fed Ex from [employer] directly to your home address. It may take 3-5 days to receive your monitor after you have been enrolled.  Once you have received your monitor, please review the enclosed instructions. Your monitor has already been registered assigning a specific monitor serial # to you.  Billing and Patient Assistance Program Information   We have supplied [employer] with any of your insurance information on file for billing purposes. [employer] offers a sliding scale Patient Assistance Program for patients that do not have insurance, or whose insurance does not completely cover the cost of the ZIO monitor.   You must apply for the Patient Assistance Program to qualify for this discounted rate.     To apply, please call [employer] at [phone], select option 4, then select option 2, and ask to apply for Patient Assistance Program.  Theodore Demark will ask your household income, and how many people are in your household.  They will quote your out-of-pocket cost based on that information.  [employer] will also be able to set up a 90-month, interest-free payment plan if needed.  Applying the monitor   Shave hair from upper left chest.  Hold abrader disc by orange tab. Rub abrader in 40 strokes over the upper left chest as indicated in your monitor instructions.  Clean area with 4 enclosed alcohol pads. Let dry.  Apply patch as  indicated in monitor instructions. Patch will be placed under collarbone on left side of chest with arrow pointing upward.  Rub patch adhesive wings for 2 minutes. Remove white label marked "1". Remove the white label marked "2". Rub patch adhesive wings for 2 additional minutes.  While looking in a mirror, press and release button in center of  patch. A small green light will flash 3-4 times. This will be your only indicator that the monitor has been turned on. ?  Do not shower for the first 24 hours. You may shower after the first 24 hours.  Press the button if you feel a symptom. You will hear a small click. Record Date, Time and Symptom in the Patient Logbook.  When you are ready to remove the patch, follow instructions on the last 2 pages of the Patient Logbook. Stick patch monitor onto the last page of Patient Logbook.  Place Patient Logbook in the blue and white box.  Use locking tab on box and tape box closed securely.  The blue and white box has prepaid postage on it. Please place it in the mailbox as soon as possible. Your physician should have your test results approximately 7 days after the monitor has been mailed back to [facility].  Call [place] at [phone] if you have questions regarding your ZIO XT patch monitor. Call them immediately if you see an orange light blinking on your monitor.  If your monitor falls off in less than 4 days, contact our Monitor department at [phone]. ?If your monitor becomes loose or falls off after 4 days call [employer] at [phone] for suggestions on securing your monitor.?

## 2020-06-09 NOTE — Progress Notes (Unsigned)
Patient enrolled for 14 day ZIO XT monitor to be shipped to her home.

## 2020-06-16 ENCOUNTER — Other Ambulatory Visit: Payer: Self-pay

## 2020-06-16 ENCOUNTER — Ambulatory Visit: Payer: Federal, State, Local not specified - PPO | Admitting: Internal Medicine

## 2020-06-16 ENCOUNTER — Encounter: Payer: Self-pay | Admitting: Internal Medicine

## 2020-06-16 VITALS — BP 110/74 | HR 80 | Ht 66.75 in | Wt 189.0 lb

## 2020-06-16 DIAGNOSIS — E0590 Thyrotoxicosis, unspecified without thyrotoxic crisis or storm: Secondary | ICD-10-CM

## 2020-06-16 DIAGNOSIS — E0500 Thyrotoxicosis with diffuse goiter without thyrotoxic crisis or storm: Secondary | ICD-10-CM

## 2020-06-16 LAB — T4, FREE: Free T4: 1.39 ng/dL (ref 0.60–1.60)

## 2020-06-16 LAB — TSH: TSH: 0.01 u[IU]/mL — ABNORMAL LOW (ref 0.35–4.50)

## 2020-06-16 NOTE — Progress Notes (Signed)
Name: [name]  MRN/ DOB: [id number], 1955-12-11    Age/ Sex: 65 y.o., female     PCP: Shanon Rosser, PA-C   Reason for Endocrinology Evaluation: Graves' Disease      Initial Endocrinology Clinic Visit: 02/16/2020    PATIENT IDENTIFIER: Ms. RENUKA FARFAN is a 65 y.o., female with a past medical history of Hx of breast ca ( S/P right lumpectomy 2018 and radiation ) and Graves' disease . She has followed with [facility] since 02/16/2020 for consultative assistance with management of her hyperthyroidism.   HISTORICAL SUMMARY:   She has been diagnosed with Graves' disease in 2016. Had a thyroid uptake and scan on 08/13/2014 at 57.5 % of I-131 , uniform uptake consistent with Graves's disease. Has been on Methimazole since her diagnosis.    SUBJECTIVE:    Today (06/16/2020):  Ms. Byron is here for a follow up on Graves disease   Weight is fluctuating  Denies constipation or diarrhea  Denies local neck symptoms Has chronic palpitations  Has been having anxiety      HOME ENDOCRINE MEDICATION  Methimazole 5 mg daily , half a tablet, three tablets a week ( Monday, Thursday  and Saturday)       HISTORY:  Past Medical History:  Past Medical History:  Diagnosis Date  . Anxiety   . Arthritis    neck  . Breast cancer ([place]) 2018   Right Breast Cancer  . DVT (deep venous thrombosis) ([place])    04/2010  . GERD (gastroesophageal reflux disease)   . Headache(784.0) 09/22/2012  . History of hiatal hernia   . History of radiation therapy 02/05/2017   02/05/17-03/05/17,  right breast 40.05 Gy in 15 fractions, right breast boost 10 Gy in 5 fractions  . Hyperlipidemia   . Hypertension   . Hyperthyroidism   . Iron deficiency anemia   . Palpitations   . Personal history of radiation therapy 2018   Right Breast Cancer  . Pulmonary embolism ([place]) 2010  . Uterine fibroid   . Vitamin D deficiency    Graves disease    Past Surgical History:  Past Surgical  History:  Procedure Laterality Date  . BREAST LUMPECTOMY Right 12/16/2016  . BREAST LUMPECTOMY WITH RADIOACTIVE SEED AND SENTINEL LYMPH NODE BIOPSY Right 12/16/2016   Procedure: RIGHT BREAST LUMPECTOMY WITH RADIOACTIVE SEED AND SENTINEL LYMPH NODE BIOPSY;  Surgeon: Rolm Bookbinder, MD;  Location: [facility];  Service: General;  Laterality: Right;  . CHOLECYSTECTOMY    . COLONOSCOPY    . DILATION AND CURETTAGE OF UTERUS     01/2008  . ESOPHAGOGASTRODUODENOSCOPY    . ivc filter       Social History:  reports that she quit smoking about 18 years ago. Her smoking use included cigarettes. She has a 4.00 pack-year smoking history. She has never used smokeless tobacco. She reports current alcohol use. She reports that she does not use drugs. Family History:  Family History  Problem Relation Age of Onset  . Drug abuse Brother   . Hypertension Mother   . Colon cancer Maternal Grandfather       HOME MEDICATIONS: Allergies as of 06/16/2020      Reactions   Amoxicillin-pot Clavulanate Other (See Comments)   Dizziness   Niacin-lovastatin Er Other (See Comments)   Caused flushing      Medication List       Accurate as of Jun 16, 2020 11:33 AM. If you have any questions, ask your  nurse or doctor.        anastrozole 1 MG tablet Commonly known as: ARIMIDEX Take 1 tablet (1 mg total) by mouth daily.   aspirin EC 81 MG tablet Take 1 tablet (81 mg total) by mouth daily.   candesartan 4 MG tablet Commonly known as: ATACAND   Fish Oil 1200 MG Caps Take 1 capsule by mouth daily.   methimazole 5 MG tablet Commonly known as: TAPAZOLE Take 0.5 tablets (2.5 mg total) by mouth as directed. Half a tablet three days  A week   SYSTANE OP Place 1 drop into both eyes daily as needed (dry eyes).   vitamin C 1000 MG tablet Take 1,000 mg by mouth daily.   VITAMIN D-3 PO Take 1 capsule by mouth daily.         OBJECTIVE:   PHYSICAL EXAM: VS: BP 110/74   Pulse 80   Ht 5' 6.75" (1.695  m)   Wt 189 lb (85.7 kg)   LMP 06/18/2010   SpO2 98%   BMI 29.82 kg/m    EXAM: General: Pt appears well and is in NAD  Eyes: External eye exam normal without stare, lid lag or exophthalmos.  EOM intact.    Neck: General: Supple without adenopathy. Thyroid: Thyroid size normal.  No goiter or nodules appreciated. No thyroid bruit.  Lungs: Clear with good BS bilat with no rales, rhonchi, or wheezes  Heart: Auscultation: RRR.  Abdomen: Normoactive bowel sounds, soft, nontender, without masses or organomegaly palpable     Mental Status: Judgment, insight: Intact Orientation: Oriented to time, place, and person Mood and affect: No depression, anxiety, or agitation     DATA REVIEWED:  Results for CALEDONIA, ZOU (MRN [id number]) as of 06/19/2020 13:29  Ref. Range 06/16/2020 15:18  TSH Latest Ref Range: 0.35 - 4.50 uIU/mL 0.01 Repeated and verified X2. (L)  T4,Free(Direct) Latest Ref Range: 0.60 - 1.60 ng/dL 1.39     ASSESSMENT / PLAN / RECOMMENDATIONS:    1. Hyperthyroidism secondary to Graves' Disease:   - Pt with some hyperthyroid symptoms such as anxiety and palpitations - No local neck symptoms - Has been on methimazole since 2016, since she is on a small dose of methimazole we have opted to postpone radioactive iodine ablation - Repeat labs today shows suppressed TSH, with normal free T4, we will increase the dose as below and consider tapering down much more slower in the future  Medications : Increase Methimazole 5 mg, half a tablet  daily   2. Graves' Disease:   No extra-thyroidal manifestations of graves' disease    Follow-up in 4 months Labs in 8 weeks   Addendum: Discussed lab results with the patient on 06/19/2020 at 1330 with the new recommendations.  Patient expressed understanding  Signed electronically by: Mack Guise, MD  [facility]  [facility] [address] [place], [place] [address] Phone:  [phone] FAX: [phone]      CC: [address] [place] [address] Phone: [phone]  Fax: [phone]   Return to Endocrinology clinic as below: Future Appointments  Date Time Provider [place]  06/16/2020  3:20 PM Zidan Helget, Melanie Crazier, MD [facility] None  12/21/2020 11:00 AM [facility] None  12/21/2020 11:30 AM Magrinat, Virgie Dad, MD [facility] None

## 2020-06-19 DIAGNOSIS — R002 Palpitations: Secondary | ICD-10-CM | POA: Diagnosis not present

## 2020-06-19 MED ORDER — METHIMAZOLE 5 MG PO TABS: 2.5000 mg | ORAL_TABLET | Freq: Every day | ORAL | 1 refills | Status: DC

## 2020-06-21 ENCOUNTER — Ambulatory Visit: Payer: Federal, State, Local not specified - PPO | Admitting: Internal Medicine

## 2020-07-12 ENCOUNTER — Telehealth: Payer: Self-pay | Admitting: Internal Medicine

## 2020-07-12 NOTE — Telephone Encounter (Signed)
New message   Patient did not disclose any information will disclose when the nurse calls back

## 2020-07-17 DIAGNOSIS — Z20822 Contact with and (suspected) exposure to covid-19: Secondary | ICD-10-CM | POA: Diagnosis not present

## 2020-07-18 DIAGNOSIS — R110 Nausea: Secondary | ICD-10-CM | POA: Diagnosis not present

## 2020-07-18 DIAGNOSIS — U071 COVID-19: Secondary | ICD-10-CM | POA: Diagnosis not present

## 2020-07-19 NOTE — Telephone Encounter (Signed)
Spoken to patient. She stated that she is concern that her thyroid level is causing her weight loss.  However, patient stated that she have change her diet which could contributed to the weight loss as well. She is currently 180 lbs from the last appointment.  Patient wanted provider to know

## 2020-07-21 NOTE — Telephone Encounter (Signed)
Spoken to patient and notified Dr Shamleffer's comments. Verbalized understanding.   

## 2020-08-08 ENCOUNTER — Other Ambulatory Visit (INDEPENDENT_AMBULATORY_CARE_PROVIDER_SITE_OTHER): Payer: Federal, State, Local not specified - PPO

## 2020-08-08 ENCOUNTER — Other Ambulatory Visit: Payer: Self-pay

## 2020-08-08 DIAGNOSIS — E0590 Thyrotoxicosis, unspecified without thyrotoxic crisis or storm: Secondary | ICD-10-CM

## 2020-08-08 LAB — TSH: TSH: 0.02 u[IU]/mL — ABNORMAL LOW (ref 0.35–4.50)

## 2020-08-08 LAB — T4, FREE: Free T4: 0.99 ng/dL (ref 0.60–1.60)

## 2020-08-09 ENCOUNTER — Other Ambulatory Visit: Payer: Self-pay

## 2020-08-09 ENCOUNTER — Emergency Department (HOSPITAL_COMMUNITY): Payer: Federal, State, Local not specified - PPO

## 2020-08-09 ENCOUNTER — Emergency Department (HOSPITAL_COMMUNITY)
Admission: EM | Admit: 2020-08-09 | Discharge: 2020-08-10 | Disposition: A | Discharge status: Home / Self Care | Payer: Federal, State, Local not specified - PPO | Attending: Emergency Medicine | Admitting: Emergency Medicine

## 2020-08-09 ENCOUNTER — Telehealth: Payer: Self-pay | Admitting: Internal Medicine

## 2020-08-09 ENCOUNTER — Encounter (HOSPITAL_COMMUNITY): Payer: Self-pay

## 2020-08-09 DIAGNOSIS — R001 Bradycardia, unspecified: Secondary | ICD-10-CM | POA: Diagnosis not present

## 2020-08-09 DIAGNOSIS — Z7982 Long term (current) use of aspirin: Secondary | ICD-10-CM | POA: Diagnosis not present

## 2020-08-09 DIAGNOSIS — K219 Gastro-esophageal reflux disease without esophagitis: Secondary | ICD-10-CM | POA: Insufficient documentation

## 2020-08-09 DIAGNOSIS — R1030 Lower abdominal pain, unspecified: Secondary | ICD-10-CM | POA: Diagnosis not present

## 2020-08-09 DIAGNOSIS — Z79899 Other long term (current) drug therapy: Secondary | ICD-10-CM | POA: Insufficient documentation

## 2020-08-09 DIAGNOSIS — I10 Essential (primary) hypertension: Secondary | ICD-10-CM | POA: Diagnosis not present

## 2020-08-09 DIAGNOSIS — K625 Hemorrhage of anus and rectum: Secondary | ICD-10-CM | POA: Diagnosis not present

## 2020-08-09 DIAGNOSIS — Z853 Personal history of malignant neoplasm of breast: Secondary | ICD-10-CM | POA: Diagnosis not present

## 2020-08-09 DIAGNOSIS — Z9049 Acquired absence of other specified parts of digestive tract: Secondary | ICD-10-CM | POA: Diagnosis not present

## 2020-08-09 DIAGNOSIS — Z87891 Personal history of nicotine dependence: Secondary | ICD-10-CM | POA: Diagnosis not present

## 2020-08-09 DIAGNOSIS — R42 Dizziness and giddiness: Secondary | ICD-10-CM | POA: Insufficient documentation

## 2020-08-09 DIAGNOSIS — L02211 Cutaneous abscess of abdominal wall: Secondary | ICD-10-CM | POA: Diagnosis not present

## 2020-08-09 DIAGNOSIS — I700 Atherosclerosis of aorta: Secondary | ICD-10-CM | POA: Diagnosis not present

## 2020-08-09 DIAGNOSIS — K449 Diaphragmatic hernia without obstruction or gangrene: Secondary | ICD-10-CM | POA: Diagnosis not present

## 2020-08-09 LAB — CBC WITH DIFFERENTIAL/PLATELET
Abs Immature Granulocytes: 0.03 10*3/uL (ref 0.00–0.07)
Basophils Absolute: 0 10*3/uL (ref 0.0–0.1)
Basophils Relative: 0 %
Eosinophils Absolute: 0 10*3/uL (ref 0.0–0.5)
Eosinophils Relative: 0 %
HCT: 39.1 % (ref 36.0–46.0)
Hemoglobin: 12.4 g/dL (ref 12.0–15.0)
Immature Granulocytes: 0 %
Lymphocytes Relative: 15 %
Lymphs Abs: 1.6 10*3/uL (ref 0.7–4.0)
MCH: 31.3 pg (ref 26.0–34.0)
MCHC: 31.7 g/dL (ref 30.0–36.0)
MCV: 98.7 fL (ref 80.0–100.0)
Monocytes Absolute: 0.6 10*3/uL (ref 0.1–1.0)
Monocytes Relative: 6 %
Neutro Abs: 8.1 10*3/uL — ABNORMAL HIGH (ref 1.7–7.7)
Neutrophils Relative %: 79 %
Platelets: 139 10*3/uL — ABNORMAL LOW (ref 150–400)
RBC: 3.96 MIL/uL (ref 3.87–5.11)
RDW: 12.2 % (ref 11.5–15.5)
WBC: 10.3 10*3/uL (ref 4.0–10.5)
nRBC: 0 % (ref 0.0–0.2)

## 2020-08-09 LAB — COMPREHENSIVE METABOLIC PANEL
ALT: 21 U/L (ref 0–44)
AST: 23 U/L (ref 15–41)
Albumin: 3.5 g/dL (ref 3.5–5.0)
Alkaline Phosphatase: 155 U/L — ABNORMAL HIGH (ref 38–126)
Anion gap: 7 (ref 5–15)
BUN: 19 mg/dL (ref 8–23)
CO2: 23 mmol/L (ref 22–32)
Calcium: 9.3 mg/dL (ref 8.9–10.3)
Chloride: 103 mmol/L (ref 98–111)
Creatinine, Ser: 0.89 mg/dL (ref 0.44–1.00)
GFR, Estimated: >60 mL/min (ref >60)
Glucose, Bld: 182 mg/dL — ABNORMAL HIGH (ref 70–99)
Potassium: 3.5 mmol/L (ref 3.5–5.1)
Sodium: 133 mmol/L — ABNORMAL LOW (ref 135–145)
Total Bilirubin: 0.4 mg/dL (ref 0.3–1.2)
Total Protein: 7 g/dL (ref 6.5–8.1)

## 2020-08-09 LAB — TROPONIN I (HIGH SENSITIVITY)
Troponin I (High Sensitivity): 4 ng/L (ref <18)
Troponin I (High Sensitivity): 4 ng/L (ref <18)

## 2020-08-09 LAB — SAMPLE TO BLOOD BANK

## 2020-08-09 LAB — LIPASE, BLOOD: Lipase: 30 U/L (ref 11–51)

## 2020-08-09 LAB — POC OCCULT BLOOD, ED: Fecal Occult Bld: NEGATIVE

## 2020-08-09 NOTE — ED Provider Notes (Signed)
Emergency Medicine Provider Triage Evaluation Note  [name] , a 65 y.o. female  was evaluated in triage.  Pt complains of bleeding.  She states that this started today.  She does not take any blood thinning medications and has no history of similar.  She states that she has pain in her abdomen.  She states that she was moving around in the chair and had a near syncopal event while waiting to be triage.  Her symptoms all started earlier today.  She states that she is having multiple bouts of bright red blood per rectum..  Review of Systems  Positive: Abdominal pain, rectal bleeding Negative: Fever  Physical Exam  BP (!) 141/109 (BP Location: Left Arm)   Pulse (!) 51   Temp 98.4 F (36.9 C)   Resp 12   LMP 06/18/2010   SpO2 100%  Gen:   Awake, appears uncomfortable Resp:  Normal effort  MSK:   Moves extremities without difficulty  Other:  Abdomen is diffusely tender primarily in lower abdomen.  She appears uncomfortable.  Medical Decision Making  Medically screening exam initiated at 3:58 PM.  Appropriate orders placed.  MACALA BALDONADO was informed that the remainder of the evaluation will be completed by another provider, this initial triage assessment does not replace that evaluation, and the importance of remaining in the ED until their evaluation is complete.  Note: Portions of this report may have been transcribed using voice recognition software. Every effort was made to ensure accuracy; however, inadvertent computerized transcription errors may be present  1600 messaged charge that patient needs a room.    Lorin Glass, PA-C 08/09/20 1600    Sherwood Gambler, MD 08/12/20 1520

## 2020-08-09 NOTE — ED Triage Notes (Signed)
Pt presents with rectal bleeding x2 hours.

## 2020-08-09 NOTE — ED Provider Notes (Signed)
[facility] EMERGENCY DEPARTMENT Provider Note   CSN: [id number] Arrival date & time: 08/09/20  1426     History Chief Complaint  Patient presents with   Rectal Bleeding    [name] is a 65 y.o. female.  Patient to ED with rectal bleeding that started just prior to arrival, associated with sharp pain across the lower abdomen and rectum. She felt like she needed to have a bowel movement but produced only BRB. She later did pass stool that was brown. No history of similar symptoms or GI bleeding. Last colonoscopy was 2021 and was "normal". No fever, nausea or vomiting. She states that her pain was so intense she feels she might have passed out while waiting to be seen. No CP, SOB. She has had a cholecystectomy but no other abdominal surgeries in the past. No urinary symptoms. No nausea or vomiting. She reports her pain has significantly improved since onset.   The history is provided by the patient. No language interpreter was used.  Rectal Bleeding Associated symptoms: abdominal pain and light-headedness   Associated symptoms: no fever and no vomiting       Past Medical History:  Diagnosis Date   Anxiety    Arthritis    neck   Breast cancer ([place]) 2018   Right Breast Cancer   DVT (deep venous thrombosis) ([place])    04/2010   GERD (gastroesophageal reflux disease)    Headache(784.0) 09/22/2012   History of hiatal hernia    History of radiation therapy 02/05/2017   02/05/17-03/05/17,  right breast 40.05 Gy in 15 fractions, right breast boost 10 Gy in 5 fractions   Hyperlipidemia    Hypertension    Hyperthyroidism    Iron deficiency anemia    Palpitations    Personal history of radiation therapy 2018   Right Breast Cancer   Pulmonary embolism ([place]) 2010   Uterine fibroid    Vitamin D deficiency    Graves disease    Patient Active Problem List   Diagnosis Date Noted   Precordial chest pain 06/08/2020   Dyslipidemia 11/08/2018   History of DVT  (deep vein thrombosis) 02/16/2018   History of breast cancer 02/16/2018   Pulmonary emboli ([place]) 11/20/2016   Malignant neoplasm of lower-outer quadrant of right breast of female, estrogen receptor positive ([place]) 11/19/2016   Graves disease 10/19/2014   Palpitation 09/24/2013   Headache(784.0) 09/22/2012   Dysarthria 09/22/2012   LEG PAIN, RIGHT 04/22/2008   VITAMIN B12 DEFICIENCY 04/19/2008   ANEMIA, IRON DEFICIENCY 04/19/2008   HYPERLIPIDEMIA 03/30/2008   HYPERTENSION 03/30/2008   ALLERGIC RHINITIS 03/30/2008   GERD 03/30/2008    Past Surgical History:  Procedure Laterality Date   BREAST LUMPECTOMY Right 12/16/2016   BREAST LUMPECTOMY WITH RADIOACTIVE SEED AND SENTINEL LYMPH NODE BIOPSY Right 12/16/2016   Procedure: RIGHT BREAST LUMPECTOMY WITH RADIOACTIVE SEED AND SENTINEL LYMPH NODE BIOPSY;  Surgeon: Rolm Bookbinder, MD;  Location: [facility];  Service: General;  Laterality: Right;   CHOLECYSTECTOMY     COLONOSCOPY     DILATION AND CURETTAGE OF UTERUS     01/2008   ESOPHAGOGASTRODUODENOSCOPY     ivc filter       OB History   No obstetric history on file.     Family History  Problem Relation Age of Onset   Drug abuse Brother    Hypertension Mother    Colon cancer Maternal Grandfather     Social History   Tobacco Use   Smoking  status: Former    Packs/day: 0.40    Years: 10.00    Pack years: 4.00    Types: Cigarettes    Quit date: 06/01/2002    Years since quitting: 18.2   Smokeless tobacco: Never  Vaping Use   Vaping Use: Never used  Substance Use Topics   Alcohol use: Yes    Comment: occasional 1 drink per month   Drug use: No    Home Medications Prior to Admission medications   Medication Sig Start Date End Date Taking? Authorizing Provider  anastrozole (ARIMIDEX) 1 MG tablet Take 1 tablet (1 mg total) by mouth daily. 12/21/19   Magrinat, Virgie Dad, MD  Ascorbic Acid (VITAMIN C) 1000 MG tablet Take 1,000 mg by mouth daily.    [provider]   aspirin EC 81 MG tablet Take 1 tablet (81 mg total) by mouth daily. 10/07/17   Minus Breeding, MD  candesartan (ATACAND) 4 MG tablet  04/08/18   [provider]  Cholecalciferol (VITAMIN D-3 PO) Take 1 capsule by mouth daily.    [provider]  FLUoxetine (PROZAC) 20 MG capsule Take 20 mg by mouth daily.    [provider]  methimazole (TAPAZOLE) 5 MG tablet Take 0.5 tablets (2.5 mg total) by mouth daily. Half a tablet three days  A week 06/19/20   Shamleffer, Melanie Crazier, MD  Omega-3 Fatty Acids (FISH OIL) 1200 MG CAPS Take 1 capsule by mouth daily.    [provider]  Polyethyl Glycol-Propyl Glycol (SYSTANE OP) Place 1 drop into both eyes daily as needed (dry eyes).     [provider]    Allergies    Amoxicillin-pot clavulanate and Niacin-lovastatin er  Review of Systems   Review of Systems  Constitutional:  Negative for chills and fever.  HENT: Negative.    Respiratory: Negative.    Cardiovascular: Negative.   Gastrointestinal:  Positive for abdominal pain, blood in stool, hematochezia and rectal pain. Negative for nausea and vomiting.  Genitourinary: Negative.   Musculoskeletal: Negative.   Skin: Negative.   Neurological:  Positive for light-headedness.   Physical Exam Updated Vital Signs BP 133/89 (BP Location: Right Arm)   Pulse (!) 109   Temp 98.9 F (37.2 C) (Oral)   Resp 18   LMP 06/18/2010   SpO2 100%   Physical Exam Vitals and nursing note reviewed.  Constitutional:      General: She is not in acute distress.    Appearance: She is well-developed. She is obese.  HENT:     Head: Normocephalic.  Cardiovascular:     Rate and Rhythm: Normal rate and regular rhythm.     Heart sounds: No murmur heard. Pulmonary:     Effort: Pulmonary effort is normal.     Breath sounds: Normal breath sounds. No wheezing, rhonchi or rales.  Abdominal:     General: Bowel sounds are normal.     Palpations: Abdomen is soft.      Tenderness: There is abdominal tenderness (Mild tenderness across lower abdomen, L>R.). There is no guarding or rebound.  Musculoskeletal:        General: Normal range of motion.     Cervical back: Normal range of motion and neck supple.  Skin:    General: Skin is warm and dry.     Coloration: Skin is not pale.  Neurological:     General: No focal deficit present.     Mental Status: She is alert and oriented to person, place, and  time.    ED Results / Procedures / Treatments   Labs (all labs ordered are listed, but only abnormal results are displayed) Labs Reviewed  COMPREHENSIVE METABOLIC PANEL - Abnormal; Notable for the following components:      Result Value   Sodium 133 (*)    Glucose, Bld 182 (*)    Alkaline Phosphatase 155 (*)    All other components within normal limits  CBC WITH DIFFERENTIAL/PLATELET - Abnormal; Notable for the following components:   Platelets 139 (*)    Neutro Abs 8.1 (*)    All other components within normal limits  LIPASE, BLOOD  POC OCCULT BLOOD, ED  CBG MONITORING, ED  SAMPLE TO BLOOD BANK  TROPONIN I (HIGH SENSITIVITY)  TROPONIN I (HIGH SENSITIVITY)    EKG EKG Interpretation  Date/Time:  Wednesday August 09 2020 15:52:51 EDT Ventricular Rate:  54 PR Interval:  144 QRS Duration: 80 QT Interval:  416 QTC Calculation: 394 R Axis:   30 Text Interpretation: Sinus bradycardia Nonspecific T wave abnormality Abnormal ECG Confirmed by Dene Gentry [phone]) on 08/09/2020 10:17:23 PM  Radiology No results found.  Procedures Procedures   Medications Ordered in ED Medications - No data to display  ED Course  I have reviewed the triage vital signs and the nursing notes.  Pertinent labs & imaging results that were available during my care of the patient were reviewed by me and considered in my medical decision making (see chart for details).    MDM Rules/Calculators/A&P                          Patient to ED with rectal bleeding,  abdominal pain and rectal pain. Sxs starting tonight. No fever.   Patient is overall well appearing. Abdomen is essentially benign on exam. VSS. Labs are unremarkable, including negative guaiac. CT abdomen/pelvis negative.   She continues to feel better. She reports she has had several bowel movements since initial evaluation. She is felt appropriate for discharge home. Discussed return precautions.   Final Clinical Impression(s) / ED Diagnoses Final diagnoses:  None   Abdominal pain, resolved Rectal bleeding, resolved  Rx / DC Orders ED Discharge Orders     None        Charlann Lange, PA-C 08/10/20 0007    Valarie Merino, MD 08/10/20 2257

## 2020-08-09 NOTE — ED Notes (Signed)
Pt reports chronic issues with constipation. Pt reports 3 bowel movements without blood since arriving to the ED and relief of pain. Pt reports some small blood around her rectum after bowel movents.

## 2020-08-09 NOTE — ED Notes (Signed)
Patient transported to CT 

## 2020-08-09 NOTE — Telephone Encounter (Signed)
Please let the patient know that her thyroid numbers are getting better and to continue half a tablet a day of methimazole   Thank you

## 2020-08-10 DIAGNOSIS — R748 Abnormal levels of other serum enzymes: Secondary | ICD-10-CM | POA: Diagnosis not present

## 2020-08-10 DIAGNOSIS — Z800 Family history of malignant neoplasm of digestive organs: Secondary | ICD-10-CM | POA: Diagnosis not present

## 2020-08-10 DIAGNOSIS — K5904 Chronic idiopathic constipation: Secondary | ICD-10-CM | POA: Diagnosis not present

## 2020-08-10 NOTE — Telephone Encounter (Signed)
Unable to LVM--message is full but will try to call the pt back.

## 2020-08-10 NOTE — Discharge Instructions (Addendum)
Follow up with Dr. Collene Mares for recheck if symptoms recur.   Return to the ED with any severe abdominal pain, high fever, significant rectal bleeding.

## 2020-08-11 ENCOUNTER — Telehealth: Payer: Self-pay | Admitting: *Deleted

## 2020-08-11 ENCOUNTER — Other Ambulatory Visit: Payer: Federal, State, Local not specified - PPO

## 2020-08-11 NOTE — Telephone Encounter (Signed)
Spoke with pt, we received a letter from the patient, she had applied for long term care through [facility] and she was denied because of a history of PFO without surgical intervention. According to her chart and previous echo, there is no mention of PFO. Copy of echo from 2018 and dr hochrein's last office note mailed to the patient at her confirmed address.

## 2020-08-14 DIAGNOSIS — S52125A Nondisplaced fracture of head of left radius, initial encounter for closed fracture: Secondary | ICD-10-CM | POA: Diagnosis not present

## 2020-08-14 DIAGNOSIS — W010XXA Fall on same level from slipping, tripping and stumbling without subsequent striking against object, initial encounter: Secondary | ICD-10-CM | POA: Diagnosis not present

## 2020-08-15 DIAGNOSIS — K625 Hemorrhage of anus and rectum: Secondary | ICD-10-CM | POA: Diagnosis not present

## 2020-08-15 DIAGNOSIS — K5900 Constipation, unspecified: Secondary | ICD-10-CM | POA: Diagnosis not present

## 2020-08-15 DIAGNOSIS — E1165 Type 2 diabetes mellitus with hyperglycemia: Secondary | ICD-10-CM | POA: Diagnosis not present

## 2020-08-15 NOTE — Telephone Encounter (Signed)
Patient requests to be called at ph# [phone] to be given her lab results and discuss possible dosage change  to medicationdue to Patient is feeling dizzy.

## 2020-08-16 NOTE — Telephone Encounter (Signed)
Pt called back and notified pt regarding thyroid results and medication instruction by Dr. Kelton Pillar. Pt stated feeling dizzy, fatigue, and off balance. Please advise

## 2020-08-17 DIAGNOSIS — S52125A Nondisplaced fracture of head of left radius, initial encounter for closed fracture: Secondary | ICD-10-CM | POA: Diagnosis not present

## 2020-08-17 DIAGNOSIS — M25522 Pain in left elbow: Secondary | ICD-10-CM | POA: Diagnosis not present

## 2020-08-18 NOTE — Telephone Encounter (Signed)
Please advise 

## 2020-08-22 NOTE — Telephone Encounter (Signed)
LVM for pt to contact her PCP to F/U with them with her questions and concerns

## 2020-09-04 DIAGNOSIS — S52125D Nondisplaced fracture of head of left radius, subsequent encounter for closed fracture with routine healing: Secondary | ICD-10-CM | POA: Diagnosis not present

## 2020-09-06 DIAGNOSIS — S52125D Nondisplaced fracture of head of left radius, subsequent encounter for closed fracture with routine healing: Secondary | ICD-10-CM | POA: Diagnosis not present

## 2020-09-06 DIAGNOSIS — R531 Weakness: Secondary | ICD-10-CM | POA: Diagnosis not present

## 2020-09-06 DIAGNOSIS — M25522 Pain in left elbow: Secondary | ICD-10-CM | POA: Diagnosis not present

## 2020-10-06 DIAGNOSIS — J019 Acute sinusitis, unspecified: Secondary | ICD-10-CM | POA: Diagnosis not present

## 2020-10-09 DIAGNOSIS — S52125D Nondisplaced fracture of head of left radius, subsequent encounter for closed fracture with routine healing: Secondary | ICD-10-CM | POA: Diagnosis not present

## 2020-10-10 ENCOUNTER — Other Ambulatory Visit: Payer: Self-pay | Admitting: Physician Assistant

## 2020-10-10 ENCOUNTER — Other Ambulatory Visit: Payer: Self-pay | Admitting: Oncology

## 2020-10-10 DIAGNOSIS — Z09 Encounter for follow-up examination after completed treatment for conditions other than malignant neoplasm: Secondary | ICD-10-CM

## 2020-10-17 DIAGNOSIS — R531 Weakness: Secondary | ICD-10-CM | POA: Diagnosis not present

## 2020-10-17 DIAGNOSIS — M25522 Pain in left elbow: Secondary | ICD-10-CM | POA: Diagnosis not present

## 2020-10-17 DIAGNOSIS — S52125D Nondisplaced fracture of head of left radius, subsequent encounter for closed fracture with routine healing: Secondary | ICD-10-CM | POA: Diagnosis not present

## 2020-10-19 DIAGNOSIS — M25522 Pain in left elbow: Secondary | ICD-10-CM | POA: Diagnosis not present

## 2020-10-19 DIAGNOSIS — S52125D Nondisplaced fracture of head of left radius, subsequent encounter for closed fracture with routine healing: Secondary | ICD-10-CM | POA: Diagnosis not present

## 2020-10-19 DIAGNOSIS — R531 Weakness: Secondary | ICD-10-CM | POA: Diagnosis not present

## 2020-10-20 ENCOUNTER — Ambulatory Visit: Payer: Federal, State, Local not specified - PPO | Admitting: Internal Medicine

## 2020-10-23 DIAGNOSIS — M25522 Pain in left elbow: Secondary | ICD-10-CM | POA: Diagnosis not present

## 2020-10-23 DIAGNOSIS — S52125D Nondisplaced fracture of head of left radius, subsequent encounter for closed fracture with routine healing: Secondary | ICD-10-CM | POA: Diagnosis not present

## 2020-10-23 DIAGNOSIS — R531 Weakness: Secondary | ICD-10-CM | POA: Diagnosis not present

## 2020-10-26 NOTE — Progress Notes (Signed)
Name: [name]  MRN/ DOB: [id number], 1955/04/26    Age/ Sex: 65 y.o., female     PCP: Shanon Rosser, PA-C   Reason for Endocrinology Evaluation: Graves' Disease      Initial Endocrinology Clinic Visit: 02/16/2020    PATIENT IDENTIFIER: [name] is a 65 y.o., female with a past medical history of Hx of breast ca ( S/P right lumpectomy 2018 and radiation ) and Graves' disease . She has followed with [facility] since 02/16/2020 for consultative assistance with management of her hyperthyroidism.   HISTORICAL SUMMARY:   She has been diagnosed with Graves' disease in 2016. Had a thyroid uptake and scan on 08/13/2014 at 57.5 % of I-131 , uniform uptake consistent with Graves's disease. Has been on Methimazole since her diagnosis.    SUBJECTIVE:    Today (10/27/2020):  [name] is here for a follow up on Graves disease   She has bee noted with weight loss   Denies local neck symptoms Has palpitations are at night  Has been having anxiety and inability to concentrate  Has constipation , resulting in rectal bleed, but this had resolved , currently on Miralax and stool softeners      HOME ENDOCRINE MEDICATION  Methimazole 5 mg daily , half a tablet daily        HISTORY:  Past Medical History:  Past Medical History:  Diagnosis Date   Anxiety    Arthritis    neck   Breast cancer ([place]) 2018   Right Breast Cancer   DVT (deep venous thrombosis) ([place])    04/2010   GERD (gastroesophageal reflux disease)    Headache(784.0) 09/22/2012   History of hiatal hernia    History of radiation therapy 02/05/2017   02/05/17-03/05/17,  right breast 40.05 Gy in 15 fractions, right breast boost 10 Gy in 5 fractions   Hyperlipidemia    Hypertension    Hyperthyroidism    Iron deficiency anemia    Palpitations    Personal history of radiation therapy 2018   Right Breast Cancer   Pulmonary embolism ([place]) 2010   Uterine fibroid    Vitamin D deficiency     Graves disease   Past Surgical History:  Past Surgical History:  Procedure Laterality Date   BREAST LUMPECTOMY Right 12/16/2016   BREAST LUMPECTOMY WITH RADIOACTIVE SEED AND SENTINEL LYMPH NODE BIOPSY Right 12/16/2016   Procedure: RIGHT BREAST LUMPECTOMY WITH RADIOACTIVE SEED AND SENTINEL LYMPH NODE BIOPSY;  Surgeon: Rolm Bookbinder, MD;  Location: [facility];  Service: General;  Laterality: Right;   CHOLECYSTECTOMY     COLONOSCOPY     DILATION AND CURETTAGE OF UTERUS     01/2008   ESOPHAGOGASTRODUODENOSCOPY     ivc filter     Social History:  reports that she quit smoking about 18 years ago. Her smoking use included cigarettes. She has a 4.00 pack-year smoking history. She has never used smokeless tobacco. She reports current alcohol use. She reports that she does not use drugs. Family History:  Family History  Problem Relation Age of Onset   Drug abuse Brother    Hypertension Mother    Colon cancer Maternal Grandfather      HOME MEDICATIONS: Allergies as of 10/27/2020       Reactions   Amoxicillin-pot Clavulanate Other (See Comments)   Dizziness   Niacin-lovastatin Er Other (See Comments)   Caused flushing        Medication List  Accurate as of October 27, 2020  3:19 PM. If you have any questions, ask your nurse or doctor.          anastrozole 1 MG tablet Commonly known as: ARIMIDEX Take 1 tablet (1 mg total) by mouth daily.   aspirin EC 81 MG tablet Take 1 tablet (81 mg total) by mouth daily.   candesartan 4 MG tablet Commonly known as: ATACAND   Fish Oil 1200 MG Caps Take 1 capsule by mouth daily.   FLUoxetine 20 MG capsule Commonly known as: PROZAC Take 20 mg by mouth daily.   methimazole 5 MG tablet Commonly known as: TAPAZOLE Take 0.5 tablets (2.5 mg total) by mouth daily. Half a tablet three days  A week   SYSTANE OP Place 1 drop into both eyes daily as needed (dry eyes).   vitamin C 1000 MG tablet Take 1,000 mg by mouth daily.    VITAMIN D-3 PO Take 1 capsule by mouth daily.          OBJECTIVE:   PHYSICAL EXAM: VS: BP 122/74 (BP Location: Left Arm, Patient Position: Sitting, Cuff Size: Small)   Pulse 74   Ht 5' 6.75" (1.695 m)   Wt 180 lb 3.2 oz (81.7 kg)   LMP 06/18/2010   SpO2 99%   BMI 28.44 kg/m    EXAM: General: Pt appears well and is in NAD  Eyes: External eye exam normal without stare, lid lag or exophthalmos.  EOM intact.    Neck: General: Supple without adenopathy. Thyroid: Thyroid size normal.  No goiter or nodules appreciated. No thyroid bruit.  Lungs: Clear with good BS bilat with no rales, rhonchi, or wheezes  Heart: Auscultation: RRR.  Abdomen: Normoactive bowel sounds, soft, nontender, without masses or organomegaly palpable     Mental Status: Judgment, insight: Intact Orientation: Oriented to time, place, and person Mood and affect: No depression, anxiety, or agitation     DATA REVIEWED:  Results for EVI, POLLOK (MRN [id number]) as of 10/27/2020 15:20  Ref. Range 10/27/2020 12:01  Sodium Latest Ref Range: 135 - 145 mEq/L 138  Potassium Latest Ref Range: 3.5 - 5.1 mEq/L 4.0  Chloride Latest Ref Range: 96 - 112 mEq/L 105  CO2 Latest Ref Range: 19 - 32 mEq/L 26  Glucose Latest Ref Range: 70 - 99 mg/dL 104 (H)  BUN Latest Ref Range: 6 - 23 mg/dL 14  Creatinine Latest Ref Range: 0.40 - 1.20 mg/dL 0.92  Calcium Latest Ref Range: 8.4 - 10.5 mg/dL 9.6  Alkaline Phosphatase Latest Ref Range: 39 - 117 U/L 148 (H)  Albumin Latest Ref Range: 3.5 - 5.2 g/dL 4.0  AST Latest Ref Range: 0 - 37 U/L 15  ALT Latest Ref Range: 0 - 35 U/L 15  Total Protein Latest Ref Range: 6.0 - 8.3 g/dL 7.8  Total Bilirubin Latest Ref Range: 0.2 - 1.2 mg/dL 0.4  GFR Latest Ref Range: >60.00 mL/min 66.54  TSH Latest Ref Range: 0.35 - 5.50 uIU/mL 0.46  T4,Free(Direct) Latest Ref Range: 0.60 - 1.60 ng/dL 0.83    ASSESSMENT / PLAN / RECOMMENDATIONS:    Hyperthyroidism secondary to Graves' Disease:    - No local neck symptoms - Has been on methimazole since 2016, since she is on a small dose of methimazole we have opted to postpone radioactive iodine ablation - Repeat labs today shows normal TFT's , no changes today    Medications : Continue Methimazole 5 mg, half a tablet  daily  2. Graves' Disease:     No extra-thyroidal manifestations of graves' disease      3. Elevated Alkaline phosphatase:   - This is trending down, will continue to monitor   Follow-up in 4 months Labs in 8 weeks    Signed electronically by: Mack Guise, MD  [facility]  [address] [place], [place] [address] Phone: [phone] FAX: [phone]      CC: [address] [place] [address] Phone: [phone]  Fax: [phone]   Return to Endocrinology clinic as below: Future Appointments  Date Time Provider [place]  11/01/2020 11:45 AM [facility] LAB [facility] None  11/20/2020  1:30 PM [facility] DIAG TOMO 2 [facility] GI-BREAST CE  12/21/2020 11:00 AM [facility] None  12/21/2020 11:30 AM Magrinat, Virgie Dad, MD [facility] None  12/22/2020 11:30 AM [facility] LAB [facility] None  03/01/2021 11:10 AM Ceazia Harb, Melanie Crazier, MD [facility] None

## 2020-10-27 ENCOUNTER — Ambulatory Visit: Payer: Federal, State, Local not specified - PPO | Admitting: Internal Medicine

## 2020-10-27 ENCOUNTER — Other Ambulatory Visit (INDEPENDENT_AMBULATORY_CARE_PROVIDER_SITE_OTHER): Payer: Federal, State, Local not specified - PPO

## 2020-10-27 ENCOUNTER — Encounter: Payer: Self-pay | Admitting: Internal Medicine

## 2020-10-27 ENCOUNTER — Telehealth: Payer: Self-pay | Admitting: Internal Medicine

## 2020-10-27 ENCOUNTER — Other Ambulatory Visit: Payer: Self-pay

## 2020-10-27 VITALS — BP 122/74 | HR 74 | Ht 66.75 in | Wt 180.2 lb

## 2020-10-27 DIAGNOSIS — E0500 Thyrotoxicosis with diffuse goiter without thyrotoxic crisis or storm: Secondary | ICD-10-CM

## 2020-10-27 DIAGNOSIS — E0590 Thyrotoxicosis, unspecified without thyrotoxic crisis or storm: Secondary | ICD-10-CM | POA: Diagnosis not present

## 2020-10-27 LAB — COMPREHENSIVE METABOLIC PANEL
ALT: 15 U/L (ref 0–35)
AST: 15 U/L (ref 0–37)
Albumin: 4 g/dL (ref 3.5–5.2)
Alkaline Phosphatase: 148 U/L — ABNORMAL HIGH (ref 39–117)
BUN: 14 mg/dL (ref 6–23)
CO2: 26 mEq/L (ref 19–32)
Calcium: 9.6 mg/dL (ref 8.4–10.5)
Chloride: 105 mEq/L (ref 96–112)
Creatinine, Ser: 0.92 mg/dL (ref 0.40–1.20)
GFR: 66.54 mL/min (ref >60.00)
Glucose, Bld: 104 mg/dL — ABNORMAL HIGH (ref 70–99)
Potassium: 4 mEq/L (ref 3.5–5.1)
Sodium: 138 mEq/L (ref 135–145)
Total Bilirubin: 0.4 mg/dL (ref 0.2–1.2)
Total Protein: 7.8 g/dL (ref 6.0–8.3)

## 2020-10-27 LAB — T4, FREE: Free T4: 0.83 ng/dL (ref 0.60–1.60)

## 2020-10-27 LAB — TSH: TSH: 0.46 u[IU]/mL (ref 0.35–5.50)

## 2020-10-27 MED ORDER — METHIMAZOLE 5 MG PO TABS: 2.5000 mg | ORAL_TABLET | Freq: Every day | ORAL | 2 refills | Status: DC

## 2020-10-27 NOTE — Telephone Encounter (Signed)
PLease let her know her thyroid is normal as well as kidney function and to continue current dose of methimazole

## 2020-10-27 NOTE — Telephone Encounter (Signed)
Vm left for patient to call back.

## 2020-10-30 NOTE — Telephone Encounter (Signed)
Patient notified of result and will continue medication

## 2020-11-01 ENCOUNTER — Other Ambulatory Visit: Payer: Federal, State, Local not specified - PPO

## 2020-11-13 ENCOUNTER — Telehealth: Payer: Self-pay | Admitting: Cardiology

## 2020-11-13 NOTE — Telephone Encounter (Signed)
Spoke to patient she stated her B/P has been elevated.Readings listed below.B/P at present 124/91 pulse 88.Patient has been eating salty foods.She has missed taking candesartan a couple of days last week.Advised to eat a low salt diet.Take candesartan every day.Check B/P daily for 1 week.Call back to report readings.

## 2020-11-13 NOTE — Telephone Encounter (Signed)
Pt c/o BP issue: STAT if pt c/o blurred vision, one-sided weakness or slurred speech  1. What are your last 5 BP readings? 09/30-157/109, 10/01-138/92, 10/02-127/92, today 128/92  2. Are you having any other symptoms (ex. Dizziness, headache, blurred vision, passed out)? Not right now but some dizziness, but not right now.  3. What is your BP issue? Patient ate some food with high sodium. Patient forgot to take her medication last night.

## 2020-11-17 ENCOUNTER — Other Ambulatory Visit: Payer: Self-pay | Admitting: Oncology

## 2020-11-17 ENCOUNTER — Other Ambulatory Visit: Payer: Self-pay | Admitting: *Deleted

## 2020-11-17 DIAGNOSIS — C50511 Malignant neoplasm of lower-outer quadrant of right female breast: Secondary | ICD-10-CM

## 2020-11-17 DIAGNOSIS — Z79811 Long term (current) use of aromatase inhibitors: Secondary | ICD-10-CM

## 2020-11-17 DIAGNOSIS — Z853 Personal history of malignant neoplasm of breast: Secondary | ICD-10-CM

## 2020-11-17 DIAGNOSIS — Z170 Estrogen receptor positive status [ER+]: Secondary | ICD-10-CM

## 2020-11-20 ENCOUNTER — Ambulatory Visit
Admission: RE | Admit: 2020-11-20 | Discharge: 2020-11-20 | Disposition: A | Discharge status: Home / Self Care | Payer: Federal, State, Local not specified - PPO | Source: Ambulatory Visit | Attending: Oncology | Admitting: Oncology

## 2020-11-20 DIAGNOSIS — Z853 Personal history of malignant neoplasm of breast: Secondary | ICD-10-CM

## 2020-11-20 DIAGNOSIS — R922 Inconclusive mammogram: Secondary | ICD-10-CM | POA: Diagnosis not present

## 2020-11-30 DIAGNOSIS — M25522 Pain in left elbow: Secondary | ICD-10-CM | POA: Diagnosis not present

## 2020-11-30 DIAGNOSIS — S52125D Nondisplaced fracture of head of left radius, subsequent encounter for closed fracture with routine healing: Secondary | ICD-10-CM | POA: Diagnosis not present

## 2020-11-30 DIAGNOSIS — R531 Weakness: Secondary | ICD-10-CM | POA: Diagnosis not present

## 2020-12-11 ENCOUNTER — Other Ambulatory Visit: Payer: Self-pay | Admitting: *Deleted

## 2020-12-11 DIAGNOSIS — Z86718 Personal history of other venous thrombosis and embolism: Secondary | ICD-10-CM

## 2020-12-12 ENCOUNTER — Other Ambulatory Visit: Payer: Self-pay | Admitting: Oncology

## 2020-12-12 DIAGNOSIS — S0990XA Unspecified injury of head, initial encounter: Secondary | ICD-10-CM | POA: Diagnosis not present

## 2020-12-12 DIAGNOSIS — J309 Allergic rhinitis, unspecified: Secondary | ICD-10-CM | POA: Diagnosis not present

## 2020-12-12 DIAGNOSIS — E785 Hyperlipidemia, unspecified: Secondary | ICD-10-CM | POA: Diagnosis not present

## 2020-12-12 DIAGNOSIS — I10 Essential (primary) hypertension: Secondary | ICD-10-CM | POA: Diagnosis not present

## 2020-12-14 ENCOUNTER — Ambulatory Visit: Payer: Federal, State, Local not specified - PPO | Admitting: Vascular Surgery

## 2020-12-14 ENCOUNTER — Other Ambulatory Visit (INDEPENDENT_AMBULATORY_CARE_PROVIDER_SITE_OTHER): Payer: Federal, State, Local not specified - PPO

## 2020-12-14 ENCOUNTER — Encounter (HOSPITAL_COMMUNITY): Payer: Federal, State, Local not specified - PPO

## 2020-12-14 DIAGNOSIS — E0500 Thyrotoxicosis with diffuse goiter without thyrotoxic crisis or storm: Secondary | ICD-10-CM | POA: Diagnosis not present

## 2020-12-14 LAB — TSH: TSH: 1.43 u[IU]/mL (ref 0.35–5.50)

## 2020-12-14 LAB — T4, FREE: Free T4: 0.82 ng/dL (ref 0.60–1.60)

## 2020-12-15 ENCOUNTER — Other Ambulatory Visit: Payer: Self-pay

## 2020-12-15 ENCOUNTER — Ambulatory Visit (HOSPITAL_COMMUNITY)
Admission: RE | Admit: 2020-12-15 | Discharge: 2020-12-15 | Disposition: A | Discharge status: Home / Self Care | Payer: Federal, State, Local not specified - PPO | Source: Ambulatory Visit | Attending: Vascular Surgery | Admitting: Vascular Surgery

## 2020-12-15 DIAGNOSIS — Z86718 Personal history of other venous thrombosis and embolism: Secondary | ICD-10-CM

## 2020-12-18 ENCOUNTER — Telehealth: Payer: Self-pay | Admitting: Cardiology

## 2020-12-18 MED ORDER — CANDESARTAN CILEXETIL 8 MG PO TABS: 8.0000 mg | ORAL_TABLET | Freq: Every day | ORAL | Status: DC

## 2020-12-18 NOTE — Telephone Encounter (Signed)
Pt c/o BP issue: STAT if pt c/o blurred vision, one-sided weakness or slurred speech  1. What are your last 5 BP readings? 157/110 PULSE 84  2. Are you having any other symptoms (ex. Dizziness, headache, blurred vision, passed out)? HEADACHE FOR 1 WEEK  3. What is your BP issue? BP HAS BEEN HIGH FOR 2 WEEKS   138/98 159/101 154/110  PT STATES WHEN SHE TAKES A TYLENOL, THE BP GOES DOWN.  PT  WANTS TO KNOW IF SHE SHOULD GO TO [place]  PT'S BP MEDS WAS MODIFIED LASTWEEK THE BEST NUMBER TO REACH PT IS [phone]

## 2020-12-18 NOTE — Telephone Encounter (Signed)
Spoke with pt regarding increased blood pressure. Pt states that over the last couple of weeks she has noticed when she takes her blood pressure is higher than it has been. Pt did call back in Oct with similar concerns and was instructed to keep a blood pressure log which she has been doing but does not have readings with her to give me. Readings per phone note, 157/110 HR 84, 138/98, 159/101, 154/110. Pt states that she takes her medication at night because when she used to taking it in the morning it caused her to feel sleepy. Pt also states that we she takes 2 tylenol due to headache associated with higher blood pressure her pressure comes back down to a normal level. Pt states that her PCP increased her candesartan to 8mg  daily on 12/12/20, however she says that this does not seem to be working. Will send to provider and pharmacy team to advise further. Pt verbalizes understanding.

## 2020-12-18 NOTE — Telephone Encounter (Signed)
Left message for pt to call back  °

## 2020-12-19 ENCOUNTER — Telehealth: Payer: Self-pay | Admitting: Oncology

## 2020-12-19 DIAGNOSIS — S0990XA Unspecified injury of head, initial encounter: Secondary | ICD-10-CM | POA: Diagnosis not present

## 2020-12-19 NOTE — Telephone Encounter (Signed)
Patient does not need to increase candesartan if BP has come down

## 2020-12-19 NOTE — Telephone Encounter (Signed)
Patient returned call

## 2020-12-19 NOTE — Telephone Encounter (Signed)
Left message to call back  

## 2020-12-19 NOTE — Telephone Encounter (Signed)
Follow Up:       Patient is returning Alisha's call.

## 2020-12-19 NOTE — Telephone Encounter (Signed)
Pt advised of Pharm D recommendations and state yesterday she was able to relax and  BP decreased to 114/84 around 5 pm. Will forward to pharm D to see if pt should continue to monitor BP vs. Increasing candersartan to 16 mg.  Pt wanted to report she sometimes  wake up at night feeling like her heart is racing. She state in the past 2 weeks its happened at least 3 times. She also report sharp pain under left arm and doesn't report any alleviating or precipitating factors. She state symptoms has occurred once a week for the past 2-3 weeks.  Appointment scheduled for 11/17 with Dr. Percival Spanish for further evaluations. Pt also made aware of ED precaution should any new symptoms develop or worsen.

## 2020-12-19 NOTE — Telephone Encounter (Signed)
Sch per 1/11 inbasket, pt aware °

## 2020-12-21 ENCOUNTER — Other Ambulatory Visit: Payer: Self-pay

## 2020-12-21 ENCOUNTER — Ambulatory Visit: Payer: Federal, State, Local not specified - PPO | Admitting: Oncology

## 2020-12-21 ENCOUNTER — Other Ambulatory Visit: Payer: Federal, State, Local not specified - PPO

## 2020-12-21 ENCOUNTER — Ambulatory Visit (INDEPENDENT_AMBULATORY_CARE_PROVIDER_SITE_OTHER): Payer: Federal, State, Local not specified - PPO | Admitting: Vascular Surgery

## 2020-12-21 ENCOUNTER — Encounter: Payer: Self-pay | Admitting: Vascular Surgery

## 2020-12-21 VITALS — BP 118/78 | HR 71 | Temp 97.9°F | Resp 20 | Ht 66.0 in | Wt 179.0 lb

## 2020-12-21 DIAGNOSIS — Z86718 Personal history of other venous thrombosis and embolism: Secondary | ICD-10-CM | POA: Diagnosis not present

## 2020-12-21 NOTE — Telephone Encounter (Signed)
Pt updated with Pharm D recommendations and verbalized understanding.

## 2020-12-21 NOTE — Progress Notes (Signed)
REASON FOR VISIT:   History of IVC filter  MEDICAL ISSUES:   HISTORY OF IVC FILTER: This patient had an IVC filter placed over 12 years ago by interventional radiology.  We have been following this as she has some extruded struts but these have not been causing any symptoms.  She had a CT scan on 07/20/2018 which showed no significant change and then again on 08/09/2020 when she presented to the emergency department which showed no significant change in her IVC filter.  She is reluctant for [place] to get too many CT scans and would prefer [place] to follow this with ultrasound if at all possible.  Given that this filter has been in for so long and we have no plans to try to retrieve it unless she develops symptoms I think this is perfectly reasonable.  I set her up for a follow-up ultrasound of her IVC and iliacs in 2 years and we will see her back at that time.  She knows to call sooner if she has problems.  HPI:   [name] is a pleasant 65 y.o. female who I did a virtual visit with in June 2020.  I previously seen her in October 2019.  She has a history of a right lower extremity DVT in 2010.  She had a pulmonary embolus despite being on Coumadin and this prompted placement of an IVC filter by interventional radiology on 05/09/2008.  A CT scan in 2017 showed the 2 of the struts appear to be penetrating the wall of the inferior vena cava.  Given that the filter been in place for 7 years I felt that removing the filter would be associated with significant risk.  We discussed getting CT scans to follow this but she felt strongly about not getting CT scans.  She was concerned about radiation and has a history of breast cancer.  She ultimately had a CT scan on 07/20/2018 that showed many of the filter legs were penetrating the IVC.  One leg appeared to involve the transverse segment of the duodenum this was unchanged compared to previous studies.  There was no evidence of inflammation or bowel abnormality in  this area.  The position of the filter was unchanged.  Since I saw her last she has been doing well except in July she had an episode where she became constipated and had significant abdominal pain.  Her symptoms resolved while she was waiting to be seen in the emergency room.  She is had no further problems.  When she did get back to be seen she did get a CT scan which showed no change in her IVC filter.  She denies any significant lower extremity swelling.  Past Medical History:  Diagnosis Date   Anxiety    Arthritis    neck   Breast cancer ([place]) 2018   Right Breast Cancer   DVT (deep venous thrombosis) ([place])    04/2010   GERD (gastroesophageal reflux disease)    Headache(784.0) 09/22/2012   History of hiatal hernia    History of radiation therapy 02/05/2017   02/05/17-03/05/17,  right breast 40.05 Gy in 15 fractions, right breast boost 10 Gy in 5 fractions   Hyperlipidemia    Hypertension    Hyperthyroidism    Iron deficiency anemia    Palpitations    Personal history of radiation therapy 2018   Right Breast Cancer   Pulmonary embolism ([place]) 2010   Uterine fibroid    Vitamin D deficiency  Graves disease    Family History  Problem Relation Age of Onset   Drug abuse Brother    Hypertension Mother    Colon cancer Maternal Grandfather     SOCIAL HISTORY: Social History   Tobacco Use   Smoking status: Former    Packs/day: 0.40    Years: 10.00    Pack years: 4.00    Types: Cigarettes    Quit date: 06/01/2002    Years since quitting: 18.5   Smokeless tobacco: Never  Substance Use Topics   Alcohol use: Yes    Comment: occasional 1 drink per month    Allergies  Allergen Reactions   Amoxicillin-Pot Clavulanate Other (See Comments)    Dizziness    Niacin-Lovastatin Er Other (See Comments)    Caused flushing     Current Outpatient Medications  Medication Sig Dispense Refill   anastrozole (ARIMIDEX) 1 MG tablet TAKE 1 TABLET BY MOUTH EVERY DAY 90 tablet 4    Ascorbic Acid (VITAMIN C) 1000 MG tablet Take 1,000 mg by mouth daily.     aspirin EC 81 MG tablet Take 1 tablet (81 mg total) by mouth daily. 90 tablet 3   candesartan (ATACAND) 8 MG tablet Take 1 tablet (8 mg total) by mouth daily.     Cholecalciferol (VITAMIN D-3 PO) Take 1 capsule by mouth daily.     FLUoxetine (PROZAC) 20 MG capsule Take 20 mg by mouth daily.     methimazole (TAPAZOLE) 5 MG tablet Take 0.5 tablets (2.5 mg total) by mouth daily. 45 tablet 2   Omega-3 Fatty Acids (OMEGA-3 FISH OIL PO) Take 2,000 mg by mouth.     Polyethyl Glycol-Propyl Glycol (SYSTANE OP) Place 1 drop into both eyes daily as needed (dry eyes).      No current facility-administered medications for this visit.    REVIEW OF SYSTEMS:  [X]  denotes positive finding, [ ]  denotes negative finding Cardiac  Comments:  Chest pain or chest pressure:    Shortness of breath upon exertion:    Short of breath when lying flat:    Irregular heart rhythm:        Vascular    Pain in calf, thigh, or hip brought on by ambulation:    Pain in feet at night that wakes you up from your sleep:     Blood clot in your veins:    Leg swelling:         Pulmonary    Oxygen at home:    Productive cough:     Wheezing:         Neurologic    Sudden weakness in arms or legs:     Sudden numbness in arms or legs:     Sudden onset of difficulty speaking or slurred speech:    Temporary loss of vision in one eye:     Problems with dizziness:         Gastrointestinal    Blood in stool:     Vomited blood:         Genitourinary    Burning when urinating:     Blood in urine:        Psychiatric    Major depression:         Hematologic    Bleeding problems:    Problems with blood clotting too easily:        Skin    Rashes or ulcers:        Constitutional    Fever or chills:  PHYSICAL EXAM:   Vitals:   12/21/20 1413  BP: 118/78  Pulse: 71  Resp: 20  Temp: 97.9 F (36.6 C)  SpO2: 97%  Weight: 179 lb (81.2 kg)   Height: 5\' 6"  (1.676 m)    GENERAL: The patient is a well-nourished female, in no acute distress. The vital signs are documented above. CARDIAC: There is a regular rate and rhythm.  VASCULAR: I do not detect carotid bruits. She has palpable femoral pulses and palpable pedal pulses. She has no significant lower extremity swelling. PULMONARY: There is good air exchange bilaterally without wheezing or rales. ABDOMEN: Soft and non-tender with normal pitched bowel sounds.  MUSCULOSKELETAL: There are no major deformities or cyanosis. NEUROLOGIC: No focal weakness or paresthesias are detected. SKIN: There are no ulcers or rashes noted. PSYCHIATRIC: The patient has a normal affect.  DATA:    DUPLEX IVC AND ILIAC VEINS: I have reviewed the duplex of the IVC and iliac veins that was done on 12/15/2020.  The IVC was widely patent.  The filter was seen in the mid IVC.  The common iliac veins were widely patent bilaterally.  Deitra Mayo [facility] [phone]

## 2020-12-22 ENCOUNTER — Other Ambulatory Visit: Payer: Federal, State, Local not specified - PPO

## 2020-12-22 DIAGNOSIS — Z01419 Encounter for gynecological examination (general) (routine) without abnormal findings: Secondary | ICD-10-CM | POA: Diagnosis not present

## 2020-12-22 DIAGNOSIS — Z6828 Body mass index (BMI) 28.0-28.9, adult: Secondary | ICD-10-CM | POA: Diagnosis not present

## 2020-12-24 DIAGNOSIS — I10 Essential (primary) hypertension: Secondary | ICD-10-CM | POA: Diagnosis not present

## 2020-12-24 DIAGNOSIS — R0789 Other chest pain: Secondary | ICD-10-CM | POA: Diagnosis not present

## 2020-12-26 ENCOUNTER — Telehealth: Payer: Self-pay | Admitting: Internal Medicine

## 2020-12-26 NOTE — Telephone Encounter (Signed)
Patient is requesting a call back for lab results.

## 2020-12-26 NOTE — Progress Notes (Signed)
Cardiology Office Note   Date:  12/28/2020   ID:  Chanin, Frumkin Feb 02, 1956, MRN [id number]  PCP:  Shanon Rosser, PA-C  Cardiologist:   Minus Breeding, MD    Chief Complaint  Patient presents with   Palpitations      History of Present Illness: [name] is a 65 y.o. female who presents for follow up of palpitations.  I saw her in 2019  She has had palpitations in the past and reports a treadmill test years ago in [place].   She has a history of DVT/PE and IVC filter several years ago.    At the last visit she had palpitations but a monitor did not show any arrhythmias.    She returns today for follow-up and she is having symptoms such as palpitations still with occasional skipped beats.  She brings her blood pressure machine and she worries because sometimes her diastolics are above 90 and occasionally above 100.  For the most part her blood pressure is controlled.  Her systolics are well controlled.  She does not feel well at times because her blood pressure drops a little bit.  She worries sometimes about her blood pressure being low and might not take her blood pressure medication in the morning.  She has had some tingling in her had that she sometimes associates with her blood pressure.  She lives alone and has lots of anxiety since she retired and lost her grandmother.   Past Medical History:  Diagnosis Date   Anxiety    Arthritis    neck   Breast cancer ([place]) 2018   Right Breast Cancer   DVT (deep venous thrombosis) ([place])    04/2010   GERD (gastroesophageal reflux disease)    Headache(784.0) 09/22/2012   History of hiatal hernia    History of radiation therapy 02/05/2017   02/05/17-03/05/17,  right breast 40.05 Gy in 15 fractions, right breast boost 10 Gy in 5 fractions   Hyperlipidemia    Hypertension    Hyperthyroidism    Iron deficiency anemia    Palpitations    Personal history of radiation therapy 2018   Right Breast Cancer   Pulmonary  embolism ([place]) 2010   Uterine fibroid    Vitamin D deficiency    Graves disease    Past Surgical History:  Procedure Laterality Date   BREAST LUMPECTOMY Right 12/16/2016   BREAST LUMPECTOMY WITH RADIOACTIVE SEED AND SENTINEL LYMPH NODE BIOPSY Right 12/16/2016   Procedure: RIGHT BREAST LUMPECTOMY WITH RADIOACTIVE SEED AND SENTINEL LYMPH NODE BIOPSY;  Surgeon: Rolm Bookbinder, MD;  Location: [facility];  Service: General;  Laterality: Right;   CHOLECYSTECTOMY     COLONOSCOPY     DILATION AND CURETTAGE OF UTERUS     01/2008   ESOPHAGOGASTRODUODENOSCOPY     ivc filter       Current Outpatient Medications  Medication Sig Dispense Refill   anastrozole (ARIMIDEX) 1 MG tablet TAKE 1 TABLET BY MOUTH EVERY DAY 90 tablet 4   Ascorbic Acid (VITAMIN C) 1000 MG tablet Take 1,000 mg by mouth daily.     aspirin EC 81 MG tablet Take 1 tablet (81 mg total) by mouth daily. 90 tablet 3   candesartan (ATACAND) 8 MG tablet Take 1 tablet (8 mg total) by mouth daily.     Cholecalciferol (VITAMIN D-3 PO) Take 1 capsule by mouth daily.     FLUoxetine (PROZAC) 20 MG capsule Take 20 mg by mouth daily.  hydrALAZINE (APRESOLINE) 10 MG tablet Take 0.5 tablets (5 mg total) by mouth as needed (for diastolic greater than 502 for up to 2 doses.). 30 tablet 3   methimazole (TAPAZOLE) 5 MG tablet Take 0.5 tablets (2.5 mg total) by mouth daily. 45 tablet 2   Omega-3 Fatty Acids (OMEGA-3 FISH OIL PO) Take 2,000 mg by mouth.     Polyethyl Glycol-Propyl Glycol (SYSTANE OP) Place 1 drop into both eyes daily as needed (dry eyes).      No current facility-administered medications for this visit.    Allergies:   Amoxicillin-pot clavulanate and Niacin-lovastatin er    ROS:  Please see the history of present illness.   Otherwise, review of systems are positive for none.   All other systems are reviewed and negative.    PHYSICAL EXAM: VS:  BP (!) 142/78   Pulse 72   Ht 5' 6.5" (1.689 m)   Wt 180 lb 12.8 oz (82 kg)    LMP 06/18/2010   SpO2 99%   BMI 28.74 kg/m  , BMI Body mass index is 28.74 kg/m.  GENERAL:  Well appearing NECK:  No jugular venous distention, waveform within normal limits, carotid upstroke brisk and symmetric, no bruits, no thyromegaly LUNGS:  Clear to auscultation bilaterally CHEST:  Unremarkable HEART:  PMI not displaced or sustained,S1 and S2 within normal limits, no S3, no S4, no clicks, no rubs, no murmurs ABD:  Flat, positive bowel sounds normal in frequency in pitch, no bruits, no rebound, no guarding, no midline pulsatile mass, no hepatomegaly, no splenomegaly EXT:  2 plus pulses throughout, no edema, no cyanosis no clubbing    EKG:  EKG is   ordered today. Sinus rhythm, rate 72, axis within normal limits, intervals within normal limits, nonspecific anterior T wave inversions.  I did compare this to previous EKGs and it was unchanged.  Recent Labs: 08/09/2020: Hemoglobin 12.4; Platelets 139 10/27/2020: ALT 15; BUN 14; Creatinine, Ser 0.92; Potassium 4.0; Sodium 138 12/14/2020: TSH 1.43    Lipid Panel    Component Value Date/Time   CHOL 152 03/21/2017 1141   TRIG 63 03/21/2017 1141   HDL 49 03/21/2017 1141   CHOLHDL 3.1 03/21/2017 1141   CHOLHDL 4.4 04/06/2010 1037   VLDL 16 04/06/2010 1037   LDLCALC 90 03/21/2017 1141      Wt Readings from Last 3 Encounters:  12/28/20 180 lb 12.8 oz (82 kg)  12/21/20 179 lb (81.2 kg)  10/27/20 180 lb 3.2 oz (81.7 kg)      Other studies Reviewed: Additional studies/ records that were reviewed today include:    BP diary Review of the above records demonstrates: See elsewhere   ASSESSMENT AND PLAN:  PALPITATIONS:    She has had no new symptoms since wearing the monitor.  No change in therapy.  We spent a long time talking about the relationship of stress and anxiety with blood pressure management.  She is going to start exercising and might start looking for a part-time job.  HTN:   Her blood pressure is very mildly labile.   She is quite anxious about it and we talked about that.  I am going to give her hydralazine 10 mg to take one half as needed for diastolics that sustained above 110.    Current medicines are reviewed at length with the patient today.  The patient does not have concerns regarding medicines.  The following changes have been made:   As above  Labs/ tests ordered today  include:     Orders Placed This Encounter  Procedures   EKG 12-Lead      Disposition:   FU with me in 1 year.  Signed, Minus Breeding, MD  12/28/2020 3:30 PM    [place]

## 2020-12-26 NOTE — Telephone Encounter (Signed)
Patient notified and will contact her pcp regarding anxiety.

## 2020-12-26 NOTE — Telephone Encounter (Signed)
Patient given lab results and states that she is still having lots of anxiety and feels this is effecting her thyroid. Please advise

## 2020-12-27 NOTE — Telephone Encounter (Addendum)
Spoke with patient of Dr. Percival Spanish - she has a visit tomorrow. She will bring a log of her BP readings tomorrow  She generally take candesartan in the morning   11/16: 111/80 (before meds) - in AM (no meds) 109/70 (before meds) - in AM (no meds) 142/92 (taken when she took meds) 2pm - started to feel weird 122/89 (15 minutes after meds) - after candesartan    Advised will notify MD of her concerns about BP and if he had suggested before her visit tmr, this would be relayed.

## 2020-12-27 NOTE — Telephone Encounter (Signed)
Pt c/o BP issue: STAT if pt c/o blurred vision, one-sided weakness or slurred speech  1. What are your last 5 BP readings?   11/13: 137/95  136/91  117/79  11/14: 116/87  11/15: 134/90 65  139/89  138/90  11/16: 111/80 (before meds) 109/70 (before meds)  142/92 (taken when she took meds) 122/89 (15 minutes after meds) 2. Are you having any other symptoms (ex. Dizziness, headache, blurred vision, passed out)?  Mild dizziness, weakness   3. What is your BP issue?   Patient states this morning she woke up and her BP was a little low. She just wanted to follow up and to make Dr. Percival Spanish aware of this prior to her appointment tomorrow, 11/17. She is unsure whether this is due to recent medication changes, but she would like to know what her normal parameters should be.

## 2020-12-28 ENCOUNTER — Other Ambulatory Visit: Payer: Self-pay

## 2020-12-28 ENCOUNTER — Encounter: Payer: Self-pay | Admitting: Cardiology

## 2020-12-28 ENCOUNTER — Ambulatory Visit (INDEPENDENT_AMBULATORY_CARE_PROVIDER_SITE_OTHER): Payer: Federal, State, Local not specified - PPO | Admitting: Cardiology

## 2020-12-28 VITALS — BP 142/78 | HR 72 | Ht 66.5 in | Wt 180.8 lb

## 2020-12-28 DIAGNOSIS — R002 Palpitations: Secondary | ICD-10-CM

## 2020-12-28 DIAGNOSIS — I10 Essential (primary) hypertension: Secondary | ICD-10-CM

## 2020-12-28 MED ORDER — HYDRALAZINE HCL 10 MG PO TABS: 5.0000 mg | ORAL_TABLET | ORAL | 3 refills | Status: DC | PRN

## 2020-12-28 NOTE — Patient Instructions (Signed)
Medication Instructions:   -Start taking hydralazine (apresoline) 5mg  as needed for diastolic (bottom number) greater than 110.  *If you need a refill on your cardiac medications before your next appointment, please call your pharmacy*   Follow-Up: At [facility], you and your health needs are our priority.  As part of our continuing mission to provide you with exceptional heart care, we have created designated Provider Care Teams.  These Care Teams include your primary Cardiologist (physician) and Advanced Practice Providers (APPs -  Physician Assistants and Nurse Practitioners) who all work together to provide you with the care you need, when you need it.  We recommend signing up for the patient portal called "MyChart".  Sign up information is provided on this After Visit Summary.  MyChart is used to connect with patients for Virtual Visits (Telemedicine).  Patients are able to view lab/test results, encounter notes, upcoming appointments, etc.  Non-urgent messages can be sent to your provider as well.   To learn more about what you can do with MyChart, go to [email or web address].    Your next appointment:   12 month(s)  The format for your next appointment:   In Person  Provider:   Minus Breeding, MD

## 2020-12-29 DIAGNOSIS — F419 Anxiety disorder, unspecified: Secondary | ICD-10-CM | POA: Diagnosis not present

## 2020-12-29 DIAGNOSIS — R7303 Prediabetes: Secondary | ICD-10-CM | POA: Diagnosis not present

## 2020-12-29 DIAGNOSIS — E785 Hyperlipidemia, unspecified: Secondary | ICD-10-CM | POA: Diagnosis not present

## 2021-01-23 ENCOUNTER — Ambulatory Visit (HOSPITAL_BASED_OUTPATIENT_CLINIC_OR_DEPARTMENT_OTHER): Payer: Federal, State, Local not specified - PPO | Admitting: Nurse Practitioner

## 2021-01-24 ENCOUNTER — Ambulatory Visit: Payer: Federal, State, Local not specified - PPO | Admitting: Cardiology

## 2021-01-24 NOTE — Progress Notes (Signed)
[facility]  [phone] [phone]     ID: [name] DOB: May 20, 1959  MR#: [id number]  EXH#:[id number]  Patient Care Team: Shanon Rosser, PA-C as PCP - General (Physician Assistant) Minus Breeding, MD as PCP - Cardiology (Cardiology) Rolm Bookbinder, MD as Consulting Physician (General Surgery) Magrinat, Virgie Dad, MD as Consulting Physician (Oncology) Gery Pray, MD as Consulting Physician (Radiation Oncology) Bobbye Charleston, MD as Consulting Physician (Obstetrics and Gynecology) OTHER MD:   CHIEF COMPLAINT: Estrogen receptor positive breast cancer  CURRENT TREATMENT: anastrozole   INTERVAL HISTORY: Liviana returns today for follow-up of her estrogen receptor positive breast cancer.   She continues on anastrozole.  She denies problems with hot flashes but she does have some vaginal dryness issues.  Arthralgias and myalgias are not a problem.  Her most recent repeat bone density screening on 12/28/2018 showed a T-score of -0.4, which is considered normal. She is scheduled for repeat on 04/26/2021.  Since her last visit, she underwent bilateral diagnostic mammography with tomography at [facility] on 11/20/2020 showing: breast density category C; no evidence of malignancy in either breast.    REVIEW OF SYSTEMS: Arrietty has a little bit of discomfort in the medial aspect of the left breast, which of course is not the surgical breast.  She tells me she saw Dr. Philis Pique for this and she has been scheduled for a diagnostic mammogram in the coming months.  Aside from that she worked in the [employer] when they had a show recently and greatly enjoyed it although she also found that stressful.  She had COVID May of this year but did not have significant symptomatology and certainly no long COVID.  A detailed review of systems today was otherwise stable   COVID 19 VACCINATION STATUS: infection 07/2020, status  post vaccines x3 as of December 2063   HISTORY OF CURRENT ILLNESS: From the original intake note:  Rosetta had routine bilateral screening mammography with tomography at the [facility] 10/08/2016. An area of possible distortion in the right breast was noted. She was recalled for right diagnostic mammography with tomography on 11/08/2016. This found the breast density to be category C. The small area of architectural distortion in the lateral right breast persisted and on 11/11/2016 biopsy of this area showed ([id number]) invasive ductal carcinoma, E-cadherin positive, estrogen receptor 95% positive with strong staining intensity, progesterone receptor 5% positive, with strong staining intensity, with an MIB-1 of 10%, and no HER-2 amplification, with a signals ratio of 1.51 and number per cell 3.40. Right axillary ultrasound 11/13/2016 was sonographically benign.  Of note, she has a history of DVT diagnosed March 2010, felt to be possibly related to estrogen replacement therapy. She was coumadinized but developed a pulmonary embolus while on Coumadin. She was then had an IVC filter placed and was anticoagulated with Lovenox for 2 years. An extensive hypercoagulable workup was negative except for an elevated factor VIII level. Her IVC filter is still in place and the patient tells me she is participating in a lawsuit regarding that.   The patient's subsequent history is as detailed below.   PAST MEDICAL HISTORY: Past Medical History:  Diagnosis Date   Anxiety    Arthritis    neck   Breast cancer ([place]) 2018   Right Breast Cancer   DVT (deep venous thrombosis) ([place])    04/2010   GERD (gastroesophageal reflux disease)    Headache(784.0) 09/22/2012   History  of hiatal hernia    History of radiation therapy 02/05/2017   02/05/17-03/05/17,  right breast 40.05 Gy in 15 fractions, right breast boost 10 Gy in 5 fractions   Hyperlipidemia    Hypertension    Hyperthyroidism    Iron deficiency  anemia    Palpitations    Personal history of radiation therapy 2018   Right Breast Cancer   Pulmonary embolism ([place]) 2010   Uterine fibroid    Vitamin D deficiency    Graves disease    PAST SURGICAL HISTORY: Past Surgical History:  Procedure Laterality Date   BREAST LUMPECTOMY Right 12/16/2016   BREAST LUMPECTOMY WITH RADIOACTIVE SEED AND SENTINEL LYMPH NODE BIOPSY Right 12/16/2016   Procedure: RIGHT BREAST LUMPECTOMY WITH RADIOACTIVE SEED AND SENTINEL LYMPH NODE BIOPSY;  Surgeon: Rolm Bookbinder, MD;  Location: [facility];  Service: General;  Laterality: Right;   CHOLECYSTECTOMY     COLONOSCOPY     DILATION AND CURETTAGE OF UTERUS     01/2008   ESOPHAGOGASTRODUODENOSCOPY     ivc filter      FAMILY HISTORY Family History  Problem Relation Age of Onset   Drug abuse Brother    Hypertension Mother    Colon cancer Maternal Grandfather   She notes that her father died at age 31 from an accidental head injury.The patient's mother is 33 years old as of October 2018. Pt has one brother and no sisters. Pt reports that a second cousin has a hx of breast cancer and was dx in her 41's. Pt denies family hx of ovarian cancer.   GYNECOLOGIC HISTORY:  Patient's last menstrual period was 06/18/2010. Menarche: 65 years old Age at first live birth: No children GP: GXP0 LMP: March 2016 Contraceptive: OCP on and off for many years d/c after DVT and PE diagnosis.  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  She lives by herself.  She keeps no pets   ADVANCED DIRECTIVES: Not in place. At the 11/20/2016 visit the patient was given the appropriate documents to complete and notarize at her discretion   HEALTH MAINTENANCE: Social History   Tobacco Use   Smoking status: Former    Packs/day: 0.40    Years: 10.00    Pack years: 4.00    Types: Cigarettes    Quit date: 06/01/2002    Years since quitting:  18.6   Smokeless tobacco: Never  Vaping Use   Vaping Use: Never used  Substance Use Topics   Alcohol use: Yes    Comment: occasional 1 drink per month   Drug use: No     Colonoscopy: 2013  PAP: 08/30/2016   Bone density: 10/04/2015 with T-score of -0.3 at femur neck right   Allergies  Allergen Reactions   Amoxicillin-Pot Clavulanate Other (See Comments)    Dizziness    Niacin-Lovastatin Er Other (See Comments)    Caused flushing     Current Outpatient Medications  Medication Sig Dispense Refill   anastrozole (ARIMIDEX) 1 MG tablet TAKE 1 TABLET BY MOUTH EVERY DAY 90 tablet 4   Ascorbic Acid (VITAMIN C) 1000 MG tablet Take 1,000 mg by mouth daily.     aspirin EC 81 MG tablet Take 1 tablet (81 mg total) by mouth daily. 90 tablet 3   candesartan (ATACAND) 8 MG tablet Take 1 tablet (8 mg total) by mouth daily.     Cholecalciferol (VITAMIN D-3 PO) Take  1 capsule by mouth daily.     FLUoxetine (PROZAC) 20 MG capsule Take 20 mg by mouth daily.     hydrALAZINE (APRESOLINE) 10 MG tablet Take 0.5 tablets (5 mg total) by mouth as needed (for diastolic greater than 536 for up to 2 doses.). 30 tablet 3   methimazole (TAPAZOLE) 5 MG tablet Take 0.5 tablets (2.5 mg total) by mouth daily. 45 tablet 2   Omega-3 Fatty Acids (OMEGA-3 FISH OIL PO) Take 2,000 mg by mouth.     Polyethyl Glycol-Propyl Glycol (SYSTANE OP) Place 1 drop into both eyes daily as needed (dry eyes).      No current facility-administered medications for this visit.    OBJECTIVE:  African-American woman in no acute distress  Vitals:   01/25/21 1157  BP: 125/75  Pulse: 79  Resp: 18  Temp: 97.9 F (36.6 C)  SpO2: 100%      Body mass index is 28.81 kg/m.   Wt Readings from Last 3 Encounters:  01/25/21 181 lb 3 oz (82.2 kg)  12/28/20 180 lb 12.8 oz (82 kg)  12/21/20 179 lb (81.2 kg)     ECOG FS:1 - Symptomatic but completely ambulatory  Sclerae unicteric, EOMs intact Wearing a mask No cervical or  supraclavicular adenopathy Lungs no rales or rhonchi Heart regular rate and rhythm Abd soft, nontender, positive bowel sounds MSK no focal spinal tenderness, no upper extremity lymphedema Neuro: nonfocal, well oriented, appropriate affect Breasts: The right breast is status postlumpectomy and radiation.  The cosmetic result is excellent.  There is no evidence of local recurrence.  I do not palpate a mass in the left breast.  In the area where she sometimes has some discomfort, which is the medial aspect of the breast, there is the usual "shelflike" boundary related to the breast attachments to the chest wall medially.  There is no palpable mass.  There is no tenderness to palpation and no erythema.  Both axillae are benign.   LAB RESULTS:  CMP     Component Value Date/Time   NA 138 10/27/2020 1201   NA 141 11/20/2016 0822   K 4.0 10/27/2020 1201   K 3.6 11/20/2016 0822   CL 105 10/27/2020 1201   CL 105 06/24/2012 1548   CO2 26 10/27/2020 1201   CO2 24 11/20/2016 0822   GLUCOSE 104 (H) 10/27/2020 1201   GLUCOSE 102 11/20/2016 0822   GLUCOSE 91 06/24/2012 1548   BUN 14 10/27/2020 1201   BUN 19.2 11/20/2016 0822   CREATININE 0.92 10/27/2020 1201   CREATININE 0.9 11/20/2016 0822   CALCIUM 9.6 10/27/2020 1201   CALCIUM 9.5 11/20/2016 0822   PROT 7.8 10/27/2020 1201   PROT 7.9 03/21/2017 1141   PROT 8.2 11/20/2016 0822   ALBUMIN 4.0 10/27/2020 1201   ALBUMIN 4.3 03/21/2017 1141   ALBUMIN 3.8 11/20/2016 0822   AST 15 10/27/2020 1201   AST 15 11/20/2016 0822   ALT 15 10/27/2020 1201   ALT 15 11/20/2016 0822   ALKPHOS 148 (H) 10/27/2020 1201   ALKPHOS 122 11/20/2016 0822   BILITOT 0.4 10/27/2020 1201   BILITOT 0.5 03/21/2017 1141   BILITOT 0.52 11/20/2016 0822   GFRNONAA >60 08/09/2020 1557   GFRAA >60 03/18/2019 1631    No results found for: TOTALPROTELP, ALBUMINELP, A1GS, A2GS, BETS, BETA2SER, GAMS, MSPIKE, SPEI  No results found for: KPAFRELGTCHN, LAMBDASER,  KAPLAMBRATIO  Lab Results  Component Value Date   WBC 10.3 08/09/2020   NEUTROABS 8.1 (H) 08/09/2020  HGB 12.4 08/09/2020   HCT 39.1 08/09/2020   MCV 98.7 08/09/2020   PLT 139 (L) 08/09/2020    Lab Results  Component Value Date   LABCA2 17 04/26/2008    No components found for: TTSVXB939  No results for input(s): INR in the last 168 hours.  Lab Results  Component Value Date   LABCA2 17 04/26/2008    No results found for: QZE092  No results found for: ZRA076  No results found for: AUQ333  No results found for: CA2729  No components found for: HGQUANT  No results found for: CEA1 / No results found for: CEA1   No results found for: AFPTUMOR  No results found for: CHROMOGRNA  No results found for: HGBA, HGBA2QUANT, HGBFQUANT, HGBSQUAN (Hemoglobinopathy evaluation)   No results found for: LDH  Lab Results  Component Value Date   IRON 61 12/25/2012   TIBC 331 12/25/2012   IRONPCTSAT 18 (L) 12/25/2012   (Iron and TIBC)  Lab Results  Component Value Date   FERRITIN 31 12/25/2012    Urinalysis    Component Value Date/Time   COLORURINE LT YELLOW 04/18/2008 1220   APPEARANCEUR Clear 04/18/2008 1220   LABSPEC > OR = 1.030 04/18/2008 1220   PHURINE 5.5 04/18/2008 1220   GLUCOSEU NEGATIVE 04/18/2008 1220   BILIRUBINUR NEGATIVE 04/18/2008 1220   KETONESUR NEGATIVE 04/18/2008 1220   UROBILINOGEN 0.2 mg/dL 04/18/2008 1220   NITRITE Negative 04/18/2008 1220   LEUKOCYTESUR Negative 04/18/2008 1220    STUDIES: No results found.    ELIGIBLE FOR AVAILABLE RESEARCH PROTOCOL: no   ASSESSMENT: 65 y.o. Pace woman status post right breast lower outer quadrant biopsy 11/11/2016 for a clinical TX N), stage I invasive ductal carcinoma, grade 2, E-cadherin positive, estrogen receptor 95% positive, progesterone receptor 5% positive, with an MIB-1 of 10%, and no HER-2 amplification  (1) Status post right lumpectomy and right axillary sentinel lymph node  sampling 12/16/2016 for a pT1c pN0, stage IA invasive ductal carcinoma, grade 1, with negative margins.  Total of 3 sentinel lymph nodes removed  (2) The Oncotype DX score was 18, predicting a risk of outside the breast recurrence over the next 10 years of 11% if the patient's only systemic therapy is tamoxifen for 5 years.  It also predicts no benefit from chemotherapy.  (3) Adjuvant radiation 02/05/2017-03/05/2017 Site/dose:    1. Right breast, 2.67 Gy in 15 fractions for a total dose of 40.05 Gy                    2. Right breast boost, 2 Gy in 5 fractions for a total dose of 10 Gy   (4) anastrozole started February 2019  (a) not a good tamoxifen candidate given problem #5  (b) bone density at breast center 10/04/2015 with T-score of -0.3 (normal)  (c) bone density 12/28/2018 showed a T score of -0.4 (normal)  (d) bone density 04/26/2021  (5) history of DVT/PE March 2010, with negative extensive hypercoagulable workup, IVC filter in place   PLAN: Lalani is now just over 4 years out from definitive surgery for her breast cancer with no evidence of disease recurrence.  This is very favorable.  She is tolerating anastrozole well and the plan is to continue that to a minimum of 5 years.  She has had a very favorable bone density since she has a repeat pending March 2023.  She will have a left diagnostic mammogram at the same time.  I expect that will  be benign.  We discussed mastalgia and its many causes.  Nevertheless I think given her history of obtaining that test is prudent and she will be reassured by those results  She will see [place] again in April to discuss the bone density results.  Today we briefly discussed whether she would want to continue the anastrozole to a total of 7 years or "graduate" after 5 years.  Of course some patients want to participate in our survivorship program and she would be a good candidate for that as well  Total encounter time 25 minutes.*   Magrinat, Virgie Dad, MD  01/25/21 12:34 PM Medical Oncology and Hematology [facility] [address] [place], [place] [address] Tel. [phone]    Fax. [phone]    I, Wilburn Mylar, am acting as scribe for Dr. Virgie Dad. Magrinat.  I, Lurline Del MD, have reviewed the above documentation for accuracy and completeness, and I agree with the above.   *Total Encounter Time as defined by the Centers for Medicare and Medicaid Services includes, in addition to the face-to-face time of a patient visit (documented in the note above) non-face-to-face time: obtaining and reviewing outside history, ordering and reviewing medications, tests or procedures, care coordination (communications with other health care professionals or caregivers) and documentation in the medical record.

## 2021-01-25 ENCOUNTER — Other Ambulatory Visit: Payer: Self-pay

## 2021-01-25 ENCOUNTER — Other Ambulatory Visit: Payer: Federal, State, Local not specified - PPO

## 2021-01-25 ENCOUNTER — Inpatient Hospital Stay: Payer: Federal, State, Local not specified - PPO | Attending: Oncology | Admitting: Oncology

## 2021-01-25 VITALS — BP 125/75 | HR 79 | Temp 97.9°F | Resp 18 | Ht 66.5 in | Wt 181.2 lb

## 2021-01-25 DIAGNOSIS — Z923 Personal history of irradiation: Secondary | ICD-10-CM | POA: Insufficient documentation

## 2021-01-25 DIAGNOSIS — Z170 Estrogen receptor positive status [ER+]: Secondary | ICD-10-CM | POA: Diagnosis not present

## 2021-01-25 DIAGNOSIS — C50511 Malignant neoplasm of lower-outer quadrant of right female breast: Secondary | ICD-10-CM

## 2021-01-25 DIAGNOSIS — Z86711 Personal history of pulmonary embolism: Secondary | ICD-10-CM | POA: Insufficient documentation

## 2021-01-25 DIAGNOSIS — Z8616 Personal history of COVID-19: Secondary | ICD-10-CM | POA: Diagnosis not present

## 2021-01-25 DIAGNOSIS — Z86718 Personal history of other venous thrombosis and embolism: Secondary | ICD-10-CM | POA: Diagnosis not present

## 2021-01-25 DIAGNOSIS — Z87891 Personal history of nicotine dependence: Secondary | ICD-10-CM | POA: Diagnosis not present

## 2021-01-26 ENCOUNTER — Ambulatory Visit (HOSPITAL_BASED_OUTPATIENT_CLINIC_OR_DEPARTMENT_OTHER): Payer: Federal, State, Local not specified - PPO | Admitting: Nurse Practitioner

## 2021-01-26 ENCOUNTER — Encounter (HOSPITAL_BASED_OUTPATIENT_CLINIC_OR_DEPARTMENT_OTHER): Payer: Self-pay | Admitting: Nurse Practitioner

## 2021-01-26 VITALS — BP 122/82 | HR 68 | Resp 14 | Ht 66.5 in | Wt 181.0 lb

## 2021-01-26 DIAGNOSIS — E559 Vitamin D deficiency, unspecified: Secondary | ICD-10-CM | POA: Diagnosis not present

## 2021-01-26 DIAGNOSIS — R5383 Other fatigue: Secondary | ICD-10-CM

## 2021-01-26 DIAGNOSIS — M545 Low back pain, unspecified: Secondary | ICD-10-CM | POA: Insufficient documentation

## 2021-01-26 DIAGNOSIS — R531 Weakness: Secondary | ICD-10-CM

## 2021-01-26 DIAGNOSIS — D259 Leiomyoma of uterus, unspecified: Secondary | ICD-10-CM | POA: Insufficient documentation

## 2021-01-26 DIAGNOSIS — Z86711 Personal history of pulmonary embolism: Secondary | ICD-10-CM | POA: Insufficient documentation

## 2021-01-26 DIAGNOSIS — E785 Hyperlipidemia, unspecified: Secondary | ICD-10-CM

## 2021-01-26 DIAGNOSIS — I82409 Acute embolism and thrombosis of unspecified deep veins of unspecified lower extremity: Secondary | ICD-10-CM | POA: Insufficient documentation

## 2021-01-26 DIAGNOSIS — C50511 Malignant neoplasm of lower-outer quadrant of right female breast: Secondary | ICD-10-CM

## 2021-01-26 DIAGNOSIS — Z170 Estrogen receptor positive status [ER+]: Secondary | ICD-10-CM | POA: Diagnosis not present

## 2021-01-26 DIAGNOSIS — R8290 Unspecified abnormal findings in urine: Secondary | ICD-10-CM | POA: Insufficient documentation

## 2021-01-26 DIAGNOSIS — E538 Deficiency of other specified B group vitamins: Secondary | ICD-10-CM

## 2021-01-26 DIAGNOSIS — D508 Other iron deficiency anemias: Secondary | ICD-10-CM | POA: Diagnosis not present

## 2021-01-26 HISTORY — DX: Acute embolism and thrombosis of unspecified deep veins of unspecified lower extremity: I82.409

## 2021-01-26 HISTORY — DX: Low back pain, unspecified: M54.50

## 2021-01-26 HISTORY — DX: Unspecified abnormal findings in urine: R82.90

## 2021-01-26 NOTE — Patient Instructions (Signed)
Thank you for choosing [place] at [facility] for your Primary Care needs. I am excited for the opportunity to partner with you to meet your health care goals. It was a pleasure meeting you today!  Recommendations from today's visit: We will get labs today and let you know if we need to make any changes or find anything concerning. If you have not had a physical in the last year, you can schedule that as you leave today.  I will review your medical history and specialists and determine if there are any concerns we have.   Information on diet, exercise, and health maintenance recommendations are listed below. This is information to help you be sure you are on track for optimal health and monitoring.   Please look over this and let us know if you have any questions or if you have completed any of the health maintenance outside of [place] so that we can be sure your records are up to date.  ___________________________________________________________ About Me: I am an Adult-Geriatric Nurse Practitioner with a background in caring for patients for more than 20 years with a strong intensive care background. I provide primary care and sports medicine services to patients age 63 and older within this office. My education had a strong focus on caring for the older adult population, which I am passionate about. I am also the director of the APP Fellowship with [facility].   My desire is to provide you with the best service through preventive medicine and supportive care. I consider you a part of the medical team and value your input. I work diligently to ensure that you are heard and your needs are met in a safe and effective manner. I want you to feel comfortable with me as your provider and want you to know that your health concerns are important to me.  For your information, our office hours are: Monday, Tuesday, and Thursday 8:00 AM - 5:00 PM Wednesday and Friday 8:00 AM -  12:00 PM.   In my time away from the office I am teaching new APP's within the system and am unavailable, but my partner, Dr. Burnard Bunting is in the office for emergent needs.   If you have questions or concerns, please call our office at [phone] or send [place] a MyChart message and we will respond as quickly as possible.  ____________________________________________________________ MyChart:  For all urgent or time sensitive needs we ask that you please call the office to avoid delays. Our number is [phone]. MyChart is not constantly monitored and due to the large volume of messages a day, replies may take up to 72 business hours.  MyChart Policy: MyChart allows for you to see your visit notes, after visit summary, provider recommendations, lab and tests results, make an appointment, request refills, and contact your provider or the office for non-urgent questions or concerns. Providers are seeing patients during normal business hours and do not have built in time to review MyChart messages.  We ask that you allow a minimum of 3 business days for responses to [employer]. For this reason, please do not send urgent requests through [place]. Please call the office at [phone]. New and ongoing conditions may require a visit. We have virtual and in person visit available for your convenience.  Complex MyChart concerns may require a visit. Your provider may request you schedule a virtual or in person visit to ensure we are providing the best care possible. MyChart messages sent after  11:00 AM on Friday will not be received by the provider until Monday morning.    Lab and Test Results: You will receive your lab and test results on MyChart as soon as they are completed and results have been sent by the lab or testing facility. Due to this service, you will receive your results BEFORE your provider.  I review lab and tests results each morning prior to seeing patients. Some results require  collaboration with other providers to ensure you are receiving the most appropriate care. For this reason, we ask that you please allow a minimum of 3-5 business days from the time the ALL results have been received for your provider to receive and review lab and test results and contact you about these.  Most lab and test result comments from the provider will be sent through [place]. Your provider may recommend changes to the plan of care, follow-up visits, repeat testing, ask questions, or request an office visit to discuss these results. You may reply directly to this message or call the office at [phone] to provide information for the provider or set up an appointment. In some instances, you will be called with test results and recommendations. Please let us know if this is preferred and we will make note of this in your chart to provide this for you.    If you have not heard a response to your lab or test results in 5 business days from all results returning to [place], please call the office to let us know. We ask that you please avoid calling prior to this time unless there is an emergent concern. Due to high call volumes, this can delay the resulting process.  After Hours: For all non-emergency after hours needs, please call the office at [phone] and select the option to reach the on-call provider service. On-call services are shared between multiple [place] offices and therefore it will not be possible to speak directly with your provider. On-call providers may provide medical advice and recommendations, but are unable to provide refills for maintenance medications.  For all emergency or urgent medical needs after normal business hours, we recommend that you seek care at the closest Urgent Care or Emergency Department to ensure appropriate treatment in a timely manner.  [facility] has a 24 hour emergency room located on the ground floor for your convenience.    Urgent Concerns During the Business Day Providers are seeing patients from 8AM to [place] with a busy schedule and are most often not able to respond to non-urgent calls until the end of the day or the next business day. If you should have URGENT concerns during the day, please call and speak to the nurse or schedule a same day appointment so that we can address your concern without delay.   Thank you, again, for choosing me as your health care partner. I appreciate your trust and look forward to learning more about you.   [name], DNP, AGNP-c ___________________________________________________________  Health Maintenance Recommendations Screening Testing Mammogram Every 1 -2 years based on history and risk factors Starting at age 56 Pap Smear Ages 21-39 every 3 years Ages 79-65 every 5 years with HPV testing More frequent testing may be required based on results and history Colon Cancer Screening Every 1-10 years based on test performed, risk factors, and history Starting at age 75 Bone Density Screening Every 2-10 years based on history Starting at age 9 for women Recommendations for men differ based on  medication usage, history, and risk factors AAA Screening One time ultrasound Men 48-23 years old who have every smoked Lung Cancer Screening Low Dose Lung CT every 12 months Age 102-80 years with a 30 pack-year smoking history who still smoke or who have quit within the last 15 years  Screening Labs Routine  Labs: Complete Blood Count (CBC), Complete Metabolic Panel (CMP), Cholesterol (Lipid Panel) Every 6-12 months based on history and medications May be recommended more frequently based on current conditions or previous results Hemoglobin A1c Lab Every 3-12 months based on history and previous results Starting at age 32 or earlier with diagnosis of diabetes, high cholesterol, BMI >26, and/or risk factors Frequent monitoring for patients with diabetes to ensure blood  sugar control Thyroid Panel (TSH w/ T3 & T4) Every 6 months based on history, symptoms, and risk factors May be repeated more often if on medication HIV One time testing for all patients 80 and older May be repeated more frequently for patients with increased risk factors or exposure Hepatitis C One time testing for all patients 72 and older May be repeated more frequently for patients with increased risk factors or exposure Gonorrhea, Chlamydia Every 12 months for all sexually active persons 13-24 years Additional monitoring may be recommended for those who are considered high risk or who have symptoms PSA Men 69-36 years old with risk factors Additional screening may be recommended from age 36-69 based on risk factors, symptoms, and history  Vaccine Recommendations Tetanus Booster All adults every 10 years Flu Vaccine All patients 6 months and older every year COVID Vaccine All patients 12 years and older Initial dosing with booster May recommend additional booster based on age and health history HPV Vaccine 2 doses all patients age 110-26 Dosing may be considered for patients over 26 Shingles Vaccine (Shingrix) 2 doses all adults 21 years and older Pneumonia (Pneumovax 23) All adults 63 years and older May recommend earlier dosing based on health history Pneumonia (Prevnar 40) All adults 70 years and older Dosed 1 year after Pneumovax 23  Additional Screening, Testing, and Vaccinations may be recommended on an individualized basis based on family history, health history, risk factors, and/or exposure.  __________________________________________________________  Diet Recommendations for All Patients  I recommend that all patients maintain a diet low in saturated fats, carbohydrates, and cholesterol. While this can be challenging at first, it is not impossible and small changes can make big differences.  Things to try: Decreasing the amount of soda, sweet tea, and/or juice  to one or less per day and replace with water While water is always the first choice, if you do not like water you may consider adding a water additive without sugar to improve the taste other sugar free drinks Replace potatoes with a brightly colored vegetable at dinner Use healthy oils, such as canola oil or olive oil, instead of butter or hard margarine Limit your bread intake to two pieces or less a day Replace regular pasta with low carb pasta options Bake, broil, or grill foods instead of frying Monitor portion sizes  Eat smaller, more frequent meals throughout the day instead of large meals  An important thing to remember is, if you love foods that are not great for your health, you don't have to give them up completely. Instead, allow these foods to be a reward when you have done well. Allowing yourself to still have special treats every once in a while is a nice way to tell yourself thank you for  working hard to keep yourself healthy.   Also remember that every day is a new day. If you have a bad day and "fall off the wagon", you can still climb right back up and keep moving along on your journey!  We have resources available to help you!  Some websites that may be helpful include: www.http://carter.biz/  Www.VeryWellFit.com _____________________________________________________________  Activity Recommendations for All Patients  I recommend that all adults get at least 20 minutes of moderate physical activity that elevates your heart rate at least 5 days out of the week.  Some examples include: Walking or jogging at a pace that allows you to carry on a conversation Cycling (stationary bike or outdoors) Water aerobics Yoga Weight lifting Dancing If physical limitations prevent you from putting stress on your joints, exercise in a pool or seated in a chair are excellent options.  Do determine your MAXIMUM heart rate for activity: YOUR AGE - 220 = MAX HeartRate   Remember! Do not  push yourself too hard.  Start slowly and build up your pace, speed, weight, time in exercise, etc.  Allow your body to rest between exercise and get good sleep. You will need more water than normal when you are exerting yourself. Do not wait until you are thirsty to drink. Drink with a purpose of getting in at least 8, 8 ounce glasses of water a day plus more depending on how much you exercise and sweat.    If you begin to develop dizziness, chest pain, abdominal pain, jaw pain, shortness of breath, headache, vision changes, lightheadedness, or other concerning symptoms, stop the activity and allow your body to rest. If your symptoms are severe, seek emergency evaluation immediately. If your symptoms are concerning, but not severe, please let us know so that we can recommend further evaluation.

## 2021-01-26 NOTE — Assessment & Plan Note (Signed)
Will monitor labs today.  

## 2021-01-26 NOTE — Assessment & Plan Note (Signed)
Followed by Dr. Jana Hakim with oncology No current concerns or new findings. Lumpectomy 2018 with radiation in 2019.  Current on estrogen blockers and will complete these in 2024

## 2021-01-26 NOTE — Progress Notes (Signed)
Orma Render, DNP, AGNP-c Primary Care & Sports Medicine [address]   [place], [place] [address] [phone]  New patient visit   Patient: [name]   DOB: 1956/01/23   65 y.o. Female  MRN: [id number] Visit Date: 01/26/2021  Patient Care Team: Kutler Vanvranken, Coralee Pesa, NP as PCP - General (Nurse Practitioner) Minus Breeding, MD as PCP - Cardiology (Cardiology) Rolm Bookbinder, MD as Consulting Physician (General Surgery) Magrinat, Virgie Dad, MD as Consulting Physician (Oncology) Gery Pray, MD as Consulting Physician (Radiation Oncology) Bobbye Charleston, MD as Consulting Physician (Obstetrics and Gynecology)  Today's healthcare provider: Orma Render, NP   Chief Complaint  Patient presents with   New Patient (Initial Visit)    Patient is here to establish care. Stated no refills needed today, she would like a complete panel of blood work. Patient stated she has graves disease that is very well maintained.   Subjective    HPI  [name] is a 65 y.o. female who presents today as a new patient to establish care.    [name] endorses a history of Graves disease (hyperthyroid) [facility]), CV disease (Dr. Percival Spanish), Breast Cancer (Dr. Jana Hakim). She also has prediabetes, HLD, HTN, and IDA. She sees Dr. Collene Mares for colon cancer screening.  She feels that her conditions are currently well managed. She would like blood work today.  She is divorced and lives alone. She retired last year. She does not smoke or use recreational drugs. She is UTD on pap smears, bone density screening, colonoscopy, and mammogram.  She is a former smoker, she quit in 2005.  In the future she would like to address fatigue, vision changes, cough, itching, dizziness, and sleep problems.   Past Medical History:  Diagnosis Date   ALLERGIC RHINITIS 03/30/2008   Qualifier: Diagnosis of  By: Jenny Reichmann MD, Hunt Oris    Anxiety    Arthritis    neck   Breast  cancer [facility]) 2018   Right Breast Cancer   Deep venous thrombosis ([place]) 01/26/2021   DVT (deep venous thrombosis) ([place])    04/2010   Dysarthria 09/22/2012   GERD (gastroesophageal reflux disease)    Headache(784.0) 09/22/2012   Headache(784.0) 09/22/2012   History of hiatal hernia    History of radiation therapy 02/05/2017   02/05/17-03/05/17,  right breast 40.05 Gy in 15 fractions, right breast boost 10 Gy in 5 fractions   Hyperlipidemia    Hypertension    Hyperthyroidism    Iron deficiency anemia    LEG PAIN, RIGHT 04/22/2008   Qualifier: Diagnosis of  By: Jenny Reichmann MD, Hunt Oris    Low back pain 01/26/2021   Malignant tumor of breast ([place]) 11/13/2016   Palpitation 09/24/2013   Palpitations    Personal history of radiation therapy 2018   Right Breast Cancer   Precordial chest pain 06/08/2020   Pulmonary emboli ([place]) 11/20/2016   Pulmonary embolism ([place]) 2010   Urine finding 01/26/2021   Uterine fibroid    Vitamin D deficiency    Graves disease   Past Surgical History:  Procedure Laterality Date   BREAST LUMPECTOMY Right 12/16/2016   BREAST LUMPECTOMY WITH RADIOACTIVE SEED AND SENTINEL LYMPH NODE BIOPSY Right 12/16/2016   Procedure: RIGHT BREAST LUMPECTOMY WITH RADIOACTIVE SEED AND SENTINEL LYMPH NODE BIOPSY;  Surgeon: Rolm Bookbinder, MD;  Location: [facility];  Service: General;  Laterality: Right;   CHOLECYSTECTOMY     COLONOSCOPY     DILATION AND CURETTAGE OF  UTERUS     01/2008   ESOPHAGOGASTRODUODENOSCOPY     ivc filter     Family Status  Relation Name Status   Brother  Deceased   Mother  Alive   Father  Deceased at age 57       MVA   MGM  Deceased   MGF  Deceased   PGM  Deceased   PGF  Deceased   Family History  Problem Relation Age of Onset   Drug abuse Brother    Hypertension Mother    Colon cancer Maternal Grandfather    Social History   Socioeconomic History   Marital status: Single    Spouse name: Not on file   Number of children: Not on file   Years of  education: college   Highest education level: Not on file  Occupational History   Occupation: TAX SPECIALIST    Employer: [employer]  Tobacco Use   Smoking status: Former    Packs/day: 0.40    Years: 10.00    Pack years: 4.00    Types: Cigarettes    Quit date: 06/01/2002    Years since quitting: 18.6   Smokeless tobacco: Never  Vaping Use   Vaping Use: Never used  Substance and Sexual Activity   Alcohol use: Yes    Comment: occasional 1 drink per month   Drug use: No   Sexual activity: Not on file  Other Topics Concern   Not on file  Social History Narrative   Lives alone.    Social Determinants of Health   Financial Resource Strain: Not on file  Food Insecurity: Not on file  Transportation Needs: Not on file  Physical Activity: Not on file  Stress: Not on file  Social Connections: Not on file   Outpatient Medications Prior to Visit  Medication Sig   anastrozole (ARIMIDEX) 1 MG tablet TAKE 1 TABLET BY MOUTH EVERY DAY   Ascorbic Acid (VITAMIN C) 1000 MG tablet Take 1,000 mg by mouth daily.   aspirin EC 81 MG tablet Take 1 tablet (81 mg total) by mouth daily.   candesartan (ATACAND) 8 MG tablet Take 1 tablet (8 mg total) by mouth daily.   Cholecalciferol (VITAMIN D-3 PO) Take 1 capsule by mouth daily.   FLUoxetine (PROZAC) 20 MG capsule Take 20 mg by mouth daily.   hydrALAZINE (APRESOLINE) 10 MG tablet Take 0.5 tablets (5 mg total) by mouth as needed (for diastolic greater than 254 for up to 2 doses.).   LORazepam (ATIVAN) 0.5 MG tablet Take 0.5 mg by mouth 2 (two) times daily as needed.   methimazole (TAPAZOLE) 5 MG tablet Take 0.5 tablets (2.5 mg total) by mouth daily.   Omega-3 Fatty Acids (OMEGA-3 FISH OIL PO) Take 2,000 mg by mouth.   Polyethyl Glycol-Propyl Glycol (SYSTANE OP) Place 1 drop into both eyes daily as needed (dry eyes).    No facility-administered medications prior to visit.   Allergies  Allergen Reactions   Amoxicillin-Pot Clavulanate Other (See  Comments)    Dizziness    Niacin-Lovastatin Er Other (See Comments)    Caused flushing     Immunization History  Administered Date(s) Administered   Influenza-Unspecified 12/20/2019    Health Maintenance  Topic Date Due   COVID-19 Vaccine (1) Never done   Pneumococcal Vaccine 66-17 Years old (1 - PCV) Never done   HIV Screening  Never done   Hepatitis C Screening  Never done   TETANUS/TDAP  Never done   Zoster Vaccines-  Shingrix (1 of 2) Never done   COLONOSCOPY (Pts 45-30yrs Insurance coverage will need to be confirmed)  Never done   PAP SMEAR-Modifier  07/12/2018   INFLUENZA VACCINE  09/11/2020   MAMMOGRAM  11/21/2022   HPV VACCINES  Aged Out    Patient Care Team: Romello Hoehn, Coralee Pesa, NP as PCP - General (Nurse Practitioner) Minus Breeding, MD as PCP - Cardiology (Cardiology) Rolm Bookbinder, MD as Consulting Physician (General Surgery) Magrinat, Virgie Dad, MD as Consulting Physician (Oncology) Gery Pray, MD as Consulting Physician (Radiation Oncology) Bobbye Charleston, MD as Consulting Physician (Obstetrics and Gynecology)  Review of Systems All review of systems negative except what is listed in the HPI   Objective    BP 122/82    LMP 06/18/2010  Physical Exam Vitals and nursing note reviewed.  Constitutional:      General: She is not in acute distress.    Appearance: Normal appearance.  Eyes:     Extraocular Movements: Extraocular movements intact.     Conjunctiva/sclera: Conjunctivae normal.     Pupils: Pupils are equal, round, and reactive to light.  Neck:     Vascular: No carotid bruit.  Cardiovascular:     Rate and Rhythm: Normal rate and regular rhythm.     Pulses: Normal pulses.     Heart sounds: Normal heart sounds. No murmur heard. Pulmonary:     Effort: Pulmonary effort is normal.     Breath sounds: Normal breath sounds. No wheezing.  Abdominal:     General: Bowel sounds are normal.     Palpations: Abdomen is soft.  Musculoskeletal:         General: Normal range of motion.     Cervical back: Normal range of motion.     Right lower leg: No edema.     Left lower leg: No edema.  Skin:    General: Skin is warm and dry.     Capillary Refill: Capillary refill takes less than 2 seconds.  Neurological:     General: No focal deficit present.     Mental Status: She is alert and oriented to person, place, and time.  Psychiatric:        Mood and Affect: Mood normal.        Behavior: Behavior normal.        Thought Content: Thought content normal.        Judgment: Judgment normal.    Depression Screen PHQ 2/9 Scores 01/26/2021 04/10/2017 01/13/2017  PHQ - 2 Score 1 0 0  PHQ- 9 Score 3 - -   No results found for any visits on 01/26/21.  Assessment & Plan      Problem List Items Addressed This Visit     Vitamin B 12 deficiency    Will monitor labs today      ANEMIA, IRON DEFICIENCY - Primary    Will monitor labs today No alarm symptoms present at this time  Will follow-up based on labs      Relevant Orders   Comprehensive metabolic panel   CBC with Differential/Platelet   Lipid panel   VITAMIN D 25 Hydroxy (Vit-D Deficiency, Fractures)   B12 and Folate Panel   Malignant neoplasm of lower-outer quadrant of right breast of female, estrogen receptor positive ([place])    Followed by Dr. Jana Hakim with oncology No current concerns or new findings. Lumpectomy 2018 with radiation in 2019.  Current on estrogen blockers and will complete these in 2024      Relevant Medications  LORazepam (ATIVAN) 0.5 MG tablet   Other Relevant Orders   Comprehensive metabolic panel   CBC with Differential/Platelet   Lipid panel   VITAMIN D 25 Hydroxy (Vit-D Deficiency, Fractures)   B12 and Folate Panel   Dyslipidemia    Will monitor labs today Currently on statin therapy No alarm symptoms present.       Relevant Orders   Comprehensive metabolic panel   CBC with Differential/Platelet   Lipid panel   VITAMIN D 25 Hydroxy (Vit-D  Deficiency, Fractures)   B12 and Folate Panel   Vitamin D deficiency    Will monitor labs today      Relevant Orders   Comprehensive metabolic panel   CBC with Differential/Platelet   Lipid panel   VITAMIN D 25 Hydroxy (Vit-D Deficiency, Fractures)   B12 and Folate Panel   Other Visit Diagnoses     Other fatigue       Relevant Orders   Comprehensive metabolic panel   CBC with Differential/Platelet   Lipid panel   VITAMIN D 25 Hydroxy (Vit-D Deficiency, Fractures)   B12 and Folate Panel   Weakness       Relevant Orders   Comprehensive metabolic panel   CBC with Differential/Platelet   Lipid panel   VITAMIN D 25 Hydroxy (Vit-D Deficiency, Fractures)   B12 and Folate Panel        Return for January 2023.    Time: 48 minutes, >50% spent counseling, care coordination, chart review, and documentation.    Coreen Shippee, Coralee Pesa, NP, DNP, AGNP-C Primary Care & Sports Medicine at [place]

## 2021-01-26 NOTE — Assessment & Plan Note (Signed)
Will monitor labs today No alarm symptoms present at this time  Will follow-up based on labs

## 2021-01-26 NOTE — Assessment & Plan Note (Signed)
Will monitor labs today Currently on statin therapy No alarm symptoms present.

## 2021-01-27 LAB — COMPREHENSIVE METABOLIC PANEL
ALT: 13 IU/L (ref 0–32)
AST: 13 IU/L (ref 0–40)
Albumin/Globulin Ratio: 1.2 (ref 1.2–2.2)
Albumin: 4.3 g/dL (ref 3.8–4.8)
Alkaline Phosphatase: 169 IU/L — ABNORMAL HIGH (ref 44–121)
BUN/Creatinine Ratio: 14 (ref 12–28)
BUN: 12 mg/dL (ref 8–27)
Bilirubin Total: 0.4 mg/dL (ref 0.0–1.2)
CO2: 24 mmol/L (ref 20–29)
Calcium: 9.4 mg/dL (ref 8.7–10.3)
Chloride: 107 mmol/L — ABNORMAL HIGH (ref 96–106)
Creatinine, Ser: 0.85 mg/dL (ref 0.57–1.00)
Globulin, Total: 3.5 g/dL (ref 1.5–4.5)
Glucose: 97 mg/dL (ref 70–99)
Potassium: 4 mmol/L (ref 3.5–5.2)
Sodium: 142 mmol/L (ref 134–144)
Total Protein: 7.8 g/dL (ref 6.0–8.5)
eGFR: 77 mL/min/{1.73_m2} (ref >59)

## 2021-01-27 LAB — LIPID PANEL
Chol/HDL Ratio: 3.1 ratio (ref 0.0–4.4)
Cholesterol, Total: 153 mg/dL (ref 100–199)
HDL: 49 mg/dL (ref >39)
LDL Chol Calc (NIH): 85 mg/dL (ref 0–99)
Triglycerides: 103 mg/dL (ref 0–149)
VLDL Cholesterol Cal: 19 mg/dL (ref 5–40)

## 2021-01-27 LAB — CBC WITH DIFFERENTIAL/PLATELET
Basophils Absolute: 0 10*3/uL (ref 0.0–0.2)
Basos: 1 %
EOS (ABSOLUTE): 0.2 10*3/uL (ref 0.0–0.4)
Eos: 3 %
Hematocrit: 36.6 % (ref 34.0–46.6)
Hemoglobin: 11.8 g/dL (ref 11.1–15.9)
Immature Grans (Abs): 0 10*3/uL (ref 0.0–0.1)
Immature Granulocytes: 0 %
Lymphocytes Absolute: 1.5 10*3/uL (ref 0.7–3.1)
Lymphs: 33 %
MCH: 30.6 pg (ref 26.6–33.0)
MCHC: 32.2 g/dL (ref 31.5–35.7)
MCV: 95 fL (ref 79–97)
Monocytes Absolute: 0.3 10*3/uL (ref 0.1–0.9)
Monocytes: 7 %
Neutrophils Absolute: 2.6 10*3/uL (ref 1.4–7.0)
Neutrophils: 56 %
Platelets: 178 10*3/uL (ref 150–450)
RBC: 3.86 x10E6/uL (ref 3.77–5.28)
RDW: 12.9 % (ref 11.7–15.4)
WBC: 4.6 10*3/uL (ref 3.4–10.8)

## 2021-01-27 LAB — VITAMIN D 25 HYDROXY (VIT D DEFICIENCY, FRACTURES): Vit D, 25-Hydroxy: 47.4 ng/mL (ref 30.0–100.0)

## 2021-01-30 DIAGNOSIS — Z6828 Body mass index (BMI) 28.0-28.9, adult: Secondary | ICD-10-CM | POA: Diagnosis not present

## 2021-01-30 DIAGNOSIS — N644 Mastodynia: Secondary | ICD-10-CM | POA: Diagnosis not present

## 2021-03-01 ENCOUNTER — Ambulatory Visit: Payer: Federal, State, Local not specified - PPO | Admitting: Internal Medicine

## 2021-03-01 NOTE — Progress Notes (Deleted)
Name: [name]  MRN/ DOB: [id number], Nov 08, 1955    Age/ Sex: 66 y.o., female     PCP: Laura Render, NP   Reason for Endocrinology Evaluation: Graves' Disease      Initial Endocrinology Clinic Visit: 02/16/2020    PATIENT IDENTIFIER: [name] is a 66 y.o., female with a past medical history of Hx of breast ca ( S/P right lumpectomy 2018 and radiation ) and Graves' disease . She has followed with [facility] since 02/16/2020 for consultative assistance with management of her hyperthyroidism.   HISTORICAL SUMMARY:   She has been diagnosed with Graves' disease in 2016. Had a thyroid uptake and scan on 08/13/2014 at 57.5 % of I-131 , uniform uptake consistent with Graves's disease. Has been on Methimazole since her diagnosis.    SUBJECTIVE:    Today (03/01/2021):  [name] is here for a follow up on Graves disease   She has bee noted with weight loss   Denies local neck symptoms Has palpitations are at night  Has been having anxiety and inability to concentrate  Has constipation , resulting in rectal bleed, but this had resolved , currently on Miralax and stool softeners      HOME ENDOCRINE MEDICATION  Methimazole 5 mg daily , half a tablet daily        HISTORY:  Past Medical History:  Past Medical History:  Diagnosis Date   ALLERGIC RHINITIS 03/30/2008   Qualifier: Diagnosis of  By: Laura Reichmann MD, [name]    Anxiety    Arthritis    neck   Breast cancer ([place]) 2018   Right Breast Cancer   Deep venous thrombosis ([place]) 01/26/2021   DVT (deep venous thrombosis) ([place])    04/2010   Dysarthria 09/22/2012   GERD (gastroesophageal reflux disease)    Headache(784.0) 09/22/2012   Headache(784.0) 09/22/2012   History of hiatal hernia    History of radiation therapy 02/05/2017   02/05/17-03/05/17,  right breast 40.05 Gy in 15 fractions, right breast boost 10 Gy in 5 fractions   Hyperlipidemia    Hypertension    Hyperthyroidism    Iron deficiency  anemia    LEG PAIN, RIGHT 04/22/2008   Qualifier: Diagnosis of  By: Laura Reichmann MD, [name]    Low back pain 01/26/2021   Malignant tumor of breast ([place]) 11/13/2016   Palpitation 09/24/2013   Palpitations    Personal history of radiation therapy 2018   Right Breast Cancer   Precordial chest pain 06/08/2020   Pulmonary emboli ([place]) 11/20/2016   Pulmonary embolism ([place]) 2010   Urine finding 01/26/2021   Uterine fibroid    Vitamin D deficiency    Graves disease   Past Surgical History:  Past Surgical History:  Procedure Laterality Date   BREAST LUMPECTOMY Right 12/16/2016   BREAST LUMPECTOMY WITH RADIOACTIVE SEED AND SENTINEL LYMPH NODE BIOPSY Right 12/16/2016   Procedure: RIGHT BREAST LUMPECTOMY WITH RADIOACTIVE SEED AND SENTINEL LYMPH NODE BIOPSY;  Surgeon: Laura Bookbinder, MD;  Location: [facility];  Service: General;  Laterality: Right;   CHOLECYSTECTOMY     COLONOSCOPY     DILATION AND CURETTAGE OF UTERUS     01/2008   ESOPHAGOGASTRODUODENOSCOPY     ivc filter     Social History:  reports that she quit smoking about 18 years ago. Her smoking use included cigarettes. She has a 4.00 pack-year smoking history. She has never used smokeless tobacco. She reports current alcohol use. She reports that  she does not use drugs. Family History:  Family History  Problem Relation Age of Onset   Drug abuse Brother    Hypertension Mother    Colon cancer Maternal Grandfather      HOME MEDICATIONS: Allergies as of 03/01/2021       Reactions   Amoxicillin-pot Clavulanate Other (See Comments)   Dizziness   Niacin-lovastatin Er Other (See Comments)   Caused flushing        Medication List        Accurate as of March 01, 2021  8:12 AM. If you have any questions, ask your nurse or doctor.          anastrozole 1 MG tablet Commonly known as: ARIMIDEX TAKE 1 TABLET BY MOUTH EVERY DAY   aspirin EC 81 MG tablet Take 1 tablet (81 mg total) by mouth daily.   candesartan 8 MG  tablet Commonly known as: Atacand Take 1 tablet (8 mg total) by mouth daily.   FLUoxetine 20 MG capsule Commonly known as: PROZAC Take 20 mg by mouth daily.   hydrALAZINE 10 MG tablet Commonly known as: APRESOLINE Take 0.5 tablets (5 mg total) by mouth as needed (for diastolic greater than 604 for up to 2 doses.).   LORazepam 0.5 MG tablet Commonly known as: ATIVAN Take 0.5 mg by mouth 2 (two) times daily as needed.   methimazole 5 MG tablet Commonly known as: TAPAZOLE Take 0.5 tablets (2.5 mg total) by mouth daily.   OMEGA-3 FISH OIL PO Take 2,000 mg by mouth.   SYSTANE OP Place 1 drop into both eyes daily as needed (dry eyes).   vitamin C 1000 MG tablet Take 1,000 mg by mouth daily.   VITAMIN D-3 PO Take 1 capsule by mouth daily.          OBJECTIVE:   PHYSICAL EXAM: VS: LMP 06/18/2010    EXAM: General: Pt appears well and is in NAD  Eyes: External eye exam normal without stare, lid lag or exophthalmos.  EOM intact.    Neck: General: Supple without adenopathy. Thyroid: Thyroid size normal.  No goiter or nodules appreciated. No thyroid bruit.  Lungs: Clear with good BS bilat with no rales, rhonchi, or wheezes  Heart: Auscultation: RRR.  Abdomen: Normoactive bowel sounds, soft, nontender, without masses or organomegaly palpable     Mental Status: Judgment, insight: Intact Orientation: Oriented to time, place, and person Mood and affect: No depression, anxiety, or agitation     DATA REVIEWED:  Results for [name] (MRN [id number]) as of 10/27/2020 15:20  Ref. Range 10/27/2020 12:01  Sodium Latest Ref Range: 135 - 145 mEq/L 138  Potassium Latest Ref Range: 3.5 - 5.1 mEq/L 4.0  Chloride Latest Ref Range: 96 - 112 mEq/L 105  CO2 Latest Ref Range: 19 - 32 mEq/L 26  Glucose Latest Ref Range: 70 - 99 mg/dL 104 (H)  BUN Latest Ref Range: 6 - 23 mg/dL 14  Creatinine Latest Ref Range: 0.40 - 1.20 mg/dL 0.92  Calcium Latest Ref Range: 8.4 - 10.5 mg/dL  9.6  Alkaline Phosphatase Latest Ref Range: 39 - 117 U/L 148 (H)  Albumin Latest Ref Range: 3.5 - 5.2 g/dL 4.0  AST Latest Ref Range: 0 - 37 U/L 15  ALT Latest Ref Range: 0 - 35 U/L 15  Total Protein Latest Ref Range: 6.0 - 8.3 g/dL 7.8  Total Bilirubin Latest Ref Range: 0.2 - 1.2 mg/dL 0.4  GFR Latest Ref Range: >60.00 mL/min 66.54  TSH  Latest Ref Range: 0.35 - 5.50 uIU/mL 0.46  T4,Free(Direct) Latest Ref Range: 0.60 - 1.60 ng/dL 0.83    ASSESSMENT / PLAN / RECOMMENDATIONS:    Hyperthyroidism secondary to Graves' Disease:   - No local neck symptoms - Has been on methimazole since 2016, since she is on a small dose of methimazole we have opted to postpone radioactive iodine ablation - Repeat labs today shows normal TFT's , no changes today    Medications : Continue Methimazole 5 mg, half a tablet  daily     2. Graves' Disease:     No extra-thyroidal manifestations of graves' disease      3. Elevated Alkaline phosphatase:   - This is trending down, will continue to monitor   Follow-up in 4 months Labs in 8 weeks    Signed electronically by: Mack Guise, MD  [facility]  [facility] [address] [place], [place] [address] Phone: [phone] FAX: [phone]      CC: Laura Render, NP [address] [place] [address] Phone: [phone]  Fax: [phone]   Return to Endocrinology clinic as below: Future Appointments  Date Time Provider [place]  03/01/2021 11:10 AM Eldor Conaway, Melanie Crazier, MD [facility] None  03/06/2021  2:30 PM Early, Coralee Pesa, NP [facility]  04/26/2021 11:30 AM GI[facility] DX DEXA 1 [facility]-BCGD[facility] GI-BREAST CE  05/30/2021 10:45 AM [facility] LAB [facility] None  05/30/2021 11:15 AM Benay Pike, MD [facility] None

## 2021-03-02 ENCOUNTER — Encounter: Payer: Self-pay | Admitting: Internal Medicine

## 2021-03-02 ENCOUNTER — Other Ambulatory Visit: Payer: Self-pay

## 2021-03-02 ENCOUNTER — Ambulatory Visit (INDEPENDENT_AMBULATORY_CARE_PROVIDER_SITE_OTHER): Payer: Federal, State, Local not specified - PPO | Admitting: Internal Medicine

## 2021-03-02 VITALS — BP 140/90 | HR 82 | Ht 66.5 in | Wt 180.0 lb

## 2021-03-02 DIAGNOSIS — E0500 Thyrotoxicosis with diffuse goiter without thyrotoxic crisis or storm: Secondary | ICD-10-CM | POA: Diagnosis not present

## 2021-03-02 DIAGNOSIS — R42 Dizziness and giddiness: Secondary | ICD-10-CM | POA: Diagnosis not present

## 2021-03-02 DIAGNOSIS — E0590 Thyrotoxicosis, unspecified without thyrotoxic crisis or storm: Secondary | ICD-10-CM

## 2021-03-02 LAB — COMPREHENSIVE METABOLIC PANEL
ALT: 20 U/L (ref 0–35)
AST: 19 U/L (ref 0–37)
Albumin: 4.4 g/dL (ref 3.5–5.2)
Alkaline Phosphatase: 158 U/L — ABNORMAL HIGH (ref 39–117)
BUN: 14 mg/dL (ref 6–23)
CO2: 30 mEq/L (ref 19–32)
Calcium: 9.8 mg/dL (ref 8.4–10.5)
Chloride: 102 mEq/L (ref 96–112)
Creatinine, Ser: 0.85 mg/dL (ref 0.40–1.20)
GFR: 73 mL/min (ref >60.00)
Glucose, Bld: 111 mg/dL — ABNORMAL HIGH (ref 70–99)
Potassium: 4.1 mEq/L (ref 3.5–5.1)
Sodium: 136 mEq/L (ref 135–145)
Total Bilirubin: 0.6 mg/dL (ref 0.2–1.2)
Total Protein: 8.1 g/dL (ref 6.0–8.3)

## 2021-03-02 LAB — CBC WITH DIFFERENTIAL/PLATELET
Basophils Absolute: 0 10*3/uL (ref 0.0–0.1)
Basophils Relative: 0.5 % (ref 0.0–3.0)
Eosinophils Absolute: 0.1 10*3/uL (ref 0.0–0.7)
Eosinophils Relative: 2 % (ref 0.0–5.0)
HCT: 39.1 % (ref 36.0–46.0)
Hemoglobin: 12.6 g/dL (ref 12.0–15.0)
Lymphocytes Relative: 37.3 % (ref 12.0–46.0)
Lymphs Abs: 2 10*3/uL (ref 0.7–4.0)
MCHC: 32.3 g/dL (ref 30.0–36.0)
MCV: 96 fl (ref 78.0–100.0)
Monocytes Absolute: 0.4 10*3/uL (ref 0.1–1.0)
Monocytes Relative: 7 % (ref 3.0–12.0)
Neutro Abs: 2.9 10*3/uL (ref 1.4–7.7)
Neutrophils Relative %: 53.2 % (ref 43.0–77.0)
Platelets: 163 10*3/uL (ref 150.0–400.0)
RBC: 4.08 Mil/uL (ref 3.87–5.11)
RDW: 13.7 % (ref 11.5–15.5)
WBC: 5.4 10*3/uL (ref 4.0–10.5)

## 2021-03-02 LAB — TSH: TSH: 1.13 u[IU]/mL (ref 0.35–5.50)

## 2021-03-02 LAB — T4, FREE: Free T4: 0.81 ng/dL (ref 0.60–1.60)

## 2021-03-02 MED ORDER — METHIMAZOLE 5 MG PO TABS: 2.5000 mg | ORAL_TABLET | Freq: Every day | ORAL | 2 refills | Status: DC

## 2021-03-02 MED ORDER — MECLIZINE HCL 25 MG PO TABS: 25.0000 mg | ORAL_TABLET | Freq: Two times a day (BID) | ORAL | 0 refills | Status: DC | PRN

## 2021-03-02 NOTE — Progress Notes (Signed)
Name: [name]  MRN/ DOB: [id number], [birth year]    Age/ Sex: 66 y.o., female     PCP: Orma Render, NP   Reason for Endocrinology Evaluation: Graves' Disease      Initial Endocrinology Clinic Visit: 02/16/2020    PATIENT IDENTIFIER: [name] is a 66 y.o., female with a past medical history of Hx of breast ca ( S/P right lumpectomy 2018 and radiation ) and Graves' disease . She has followed with [facility] since 02/16/2020 for consultative assistance with management of her hyperthyroidism.   HISTORICAL SUMMARY:   She has been diagnosed with Graves' disease in 2016. Had a thyroid uptake and scan on 08/13/2014 at 57.5 % of I-131 , uniform uptake consistent with Graves's disease. Has been on Methimazole since her diagnosis.    SUBJECTIVE:    Today (03/02/2021):  [name] is here for a follow up on Graves disease   Weight stable  She had margarita last Tuesday and has not been felling well ever since, she woke up the following night with palpitations, she has been dizzy worse in the morning when she first wake up she has slight nausea as well\.  Dizziness is worse with motion  No change in bowel movement  Denies local neck symptoms       HOME ENDOCRINE MEDICATION  Methimazole 5 mg daily , half a tablet daily        HISTORY:  Past Medical History:  Past Medical History:  Diagnosis Date   ALLERGIC RHINITIS 03/30/2008   Qualifier: Diagnosis of  By: Jenny Reichmann MD, Hunt Oris    Anxiety    Arthritis    neck   Breast cancer ([facility]) 2018   Right Breast Cancer   Deep venous thrombosis ([facility]) 01/26/2021   DVT (deep venous thrombosis) ([facility])    04/2010   Dysarthria 09/22/2012   GERD (gastroesophageal reflux disease)    Headache(784.0) 09/22/2012   Headache(784.0) 09/22/2012   History of hiatal hernia    History of radiation therapy 02/05/2017   02/05/17-03/05/17,  right breast 40.05 Gy in 15 fractions, right breast boost 10 Gy in 5 fractions    Hyperlipidemia    Hypertension    Hyperthyroidism    Iron deficiency anemia    LEG PAIN, RIGHT 04/22/2008   Qualifier: Diagnosis of  By: Jenny Reichmann MD, Hunt Oris    Low back pain 01/26/2021   Malignant tumor of breast ([facility]) 11/13/2016   Palpitation 09/24/2013   Palpitations    Personal history of radiation therapy 2018   Right Breast Cancer   Precordial chest pain 06/08/2020   Pulmonary emboli ([facility]) 11/20/2016   Pulmonary embolism ([facility]) 2010   Urine finding 01/26/2021   Uterine fibroid    Vitamin D deficiency    Graves disease   Past Surgical History:  Past Surgical History:  Procedure Laterality Date   BREAST LUMPECTOMY Right 12/16/2016   BREAST LUMPECTOMY WITH RADIOACTIVE SEED AND SENTINEL LYMPH NODE BIOPSY Right 12/16/2016   Procedure: RIGHT BREAST LUMPECTOMY WITH RADIOACTIVE SEED AND SENTINEL LYMPH NODE BIOPSY;  Surgeon: Rolm Bookbinder, MD;  Location: [facility];  Service: General;  Laterality: Right;   CHOLECYSTECTOMY     COLONOSCOPY     DILATION AND CURETTAGE OF UTERUS     01/2008   ESOPHAGOGASTRODUODENOSCOPY     ivc filter     Social History:  reports that she quit smoking about 18 years ago. Her smoking use included cigarettes. She has a 4.00 pack-year smoking  history. She has never used smokeless tobacco. She reports current alcohol use. She reports that she does not use drugs. Family History:  Family History  Problem Relation Age of Onset   Drug abuse Brother    Hypertension Mother    Colon cancer Maternal Grandfather      HOME MEDICATIONS: Allergies as of 03/02/2021       Reactions   Amoxicillin-pot Clavulanate Other (See Comments)   Dizziness   Niacin-lovastatin Er Other (See Comments)   Caused flushing        Medication List        Accurate as of March 02, 2021  5:04 PM. If you have any questions, ask your nurse or doctor.          anastrozole 1 MG tablet Commonly known as: ARIMIDEX TAKE 1 TABLET BY MOUTH EVERY DAY   aspirin EC 81 MG tablet Take 1  tablet (81 mg total) by mouth daily.   candesartan 8 MG tablet Commonly known as: Atacand Take 1 tablet (8 mg total) by mouth daily.   FLUoxetine 20 MG capsule Commonly known as: PROZAC Take 20 mg by mouth daily.   hydrALAZINE 10 MG tablet Commonly known as: APRESOLINE Take 0.5 tablets (5 mg total) by mouth as needed (for diastolic greater than 093 for up to 2 doses.).   LORazepam 0.5 MG tablet Commonly known as: ATIVAN Take 0.5 mg by mouth 2 (two) times daily as needed.   meclizine 25 MG tablet Commonly known as: ANTIVERT Take 1 tablet (25 mg total) by mouth 2 (two) times daily as needed for dizziness. Started by: Dorita Sciara, MD   methimazole 5 MG tablet Commonly known as: TAPAZOLE Take 0.5 tablets (2.5 mg total) by mouth daily.   OMEGA-3 FISH OIL PO Take 2,000 mg by mouth.   SYSTANE OP Place 1 drop into both eyes daily as needed (dry eyes).   vitamin C 1000 MG tablet Take 1,000 mg by mouth daily.   VITAMIN D-3 PO Take 1 capsule by mouth daily.          OBJECTIVE:   PHYSICAL EXAM: VS: BP 140/90 (BP Location: Left Arm, Patient Position: Sitting, Cuff Size: Small)    Pulse 82    Ht 5' 6.5" (1.689 m)    Wt 180 lb (81.6 kg)    LMP 06/18/2010    SpO2 99%    BMI 28.62 kg/m    EXAM: General: Pt appears well and is in NAD  Eyes: External eye exam normal without stare, lid lag or exophthalmos.  EOM intact.    Neck: General: Supple without adenopathy. Thyroid: Thyroid size normal.  No goiter or nodules appreciated. No thyroid bruit.  Lungs: Clear with good BS bilat with no rales, rhonchi, or wheezes  Heart: Auscultation: RRR.  Abdomen: Normoactive bowel sounds, soft, nontender, without masses or organomegaly palpable     Mental Status: Judgment, insight: Intact Orientation: Oriented to time, place, and person Mood and affect: No depression, anxiety, or agitation     DATA REVIEWED:   Latest Reference Range & Units 03/02/21 14:48  Sodium 135 - 145  mEq/L 136  Potassium 3.5 - 5.1 mEq/L 4.1  Chloride 96 - 112 mEq/L 102  CO2 19 - 32 mEq/L 30  Glucose 70 - 99 mg/dL 111 (H)  BUN 6 - 23 mg/dL 14  Creatinine 0.40 - 1.20 mg/dL 0.85  Calcium 8.4 - 10.5 mg/dL 9.8  Alkaline Phosphatase 39 - 117 U/L 158 (H)  Albumin 3.5 -  5.2 g/dL 4.4  AST 0 - 37 U/L 19  ALT 0 - 35 U/L 20  Total Protein 6.0 - 8.3 g/dL 8.1  Total Bilirubin 0.2 - 1.2 mg/dL 0.6  GFR >60.00 mL/min 73.00    Latest Reference Range & Units 03/02/21 14:48  WBC 4.0 - 10.5 K/uL 5.4  RBC 3.87 - 5.11 Mil/uL 4.08  Hemoglobin 12.0 - 15.0 g/dL 12.6  HCT 36.0 - 46.0 % 39.1  MCV 78.0 - 100.0 fl 96.0  MCHC 30.0 - 36.0 g/dL 32.3  RDW 11.5 - 15.5 % 13.7  Platelets 150.0 - 400.0 K/uL 163.0  Neutrophils 43.0 - 77.0 % 53.2  Lymphocytes 12.0 - 46.0 % 37.3  Monocytes Relative 3.0 - 12.0 % 7.0  Eosinophil 0.0 - 5.0 % 2.0  Basophil 0.0 - 3.0 % 0.5  NEUT# 1.4 - 7.7 K/uL 2.9  Lymphocyte # 0.7 - 4.0 K/uL 2.0  Monocyte # 0.1 - 1.0 K/uL 0.4    Latest Reference Range & Units 03/02/21 14:48  TSH 0.35 - 5.50 uIU/mL 1.13  T4,Free(Direct) 0.60 - 1.60 ng/dL 0.81    ASSESSMENT / PLAN / RECOMMENDATIONS:    Hyperthyroidism secondary to Graves' Disease:   - No local neck symptoms - Has been on methimazole since 2016, since she is on a small dose of methimazole we have opted to postpone radioactive iodine ablation -TFTs are normal   Medications : Continue Methimazole 5 mg, half a tablet  daily     2. Graves' Disease:     No extra-thyroidal manifestations of graves' disease      3. Elevated Alkaline phosphatase:   - This is trending down, will continue to monitor    4.  Vertigo:   -We will treat her with meclizine as needed.  I have cautioned her against drowsiness -Patient to follow-up with PCP if symptoms worsen or do not resolve  Medication Meclizine 25 mg twice daily as needed    Follow-up in 4 months Labs in 8 weeks    Signed electronically by: Mack Guise, MD  [facility]  [facility] [address] [place], [place] [address] Phone: [phone] FAX: [phone]      CC: Orma Render, NP [address] [place] [address] Phone: [phone]  Fax: [phone]   Return to Endocrinology clinic as below: Future Appointments  Date Time Provider [place]  03/06/2021  2:30 PM Early, Coralee Pesa, NP [facility]  04/26/2021 11:30 AM GI-BCG DX DEXA 1 GI-BCGDG GI-BREAST CE  05/30/2021 10:45 AM [facility] LAB [facility] None  05/30/2021 11:15 AM Benay Pike, MD [facility] None  08/31/2021  1:00 PM Viveca Beckstrom, Melanie Crazier, MD [facility] None

## 2021-03-06 ENCOUNTER — Ambulatory Visit (INDEPENDENT_AMBULATORY_CARE_PROVIDER_SITE_OTHER): Payer: Federal, State, Local not specified - PPO | Admitting: Family Medicine

## 2021-03-06 ENCOUNTER — Encounter (HOSPITAL_BASED_OUTPATIENT_CLINIC_OR_DEPARTMENT_OTHER): Payer: Self-pay | Admitting: Family Medicine

## 2021-03-06 ENCOUNTER — Other Ambulatory Visit: Payer: Self-pay

## 2021-03-06 VITALS — BP 138/70 | HR 63 | Ht 66.0 in | Wt 182.0 lb

## 2021-03-06 DIAGNOSIS — R42 Dizziness and giddiness: Secondary | ICD-10-CM

## 2021-03-06 DIAGNOSIS — Z0000 Encounter for general adult medical examination without abnormal findings: Secondary | ICD-10-CM | POA: Insufficient documentation

## 2021-03-06 NOTE — Patient Instructions (Signed)
°  Medication Instructions:  Your physician recommends that you continue on your current medications as directed. Please refer to the Current Medication list given to you today. --If you need a refill on any your medications before your next appointment, please call your pharmacy first. If no refills are authorized on file call the office.-- Lab Work: Your physician has recommended that you have lab work today: Vitamin B12 and A1C If you have labs (blood work) drawn today and your tests are completely normal, you will receive your results via [facility] a phone call from our staff.  Please ensure you check your voicemail in the event that you authorized detailed messages to be left on a delegated number. If you have any lab test that is abnormal or we need to change your treatment, we will call you to review the results.  Referrals/Procedures/Imaging: A referral has been placed for you to Dr. Benjamine Mola (ENT) for evaluation and treatment. Someone from the scheduling department will be in contact with you in regards to coordinating your consultation. If you do not hear from any of the schedulers within 7-10 business days please give their office a call.  Follow-Up: Your next appointment:   Your physician recommends that you schedule a follow-up appointment in: 3-4 MONTHS with [name], DNP  You will receive a text message or e-mail with a link to a survey about your care and experience with [place] today! We would greatly appreciate your feedback!   Thanks for letting us be apart of your health journey!!  Primary Care and Sports Medicine   Dr. Arlina Robes [place]   We encourage you to activate your patient portal called "MyChart".  Sign up information is provided on this After Visit Summary.  MyChart is used to connect with patients for Virtual Visits (Telemedicine).  Patients are able to view lab/test results, encounter notes, upcoming appointments, etc.  Non-urgent messages can be sent to your  provider as well. To learn more about what you can do with MyChart, please visit --  [email or web address].

## 2021-03-06 NOTE — Assessment & Plan Note (Addendum)
The patient was counseled, risk factors were discussed, anticipatory guidance given. Recommend regular dental and vision screenings Reports being up-to-date on colonoscopy with most recent screening completed by Dr. Collene Mares in 2020 with recommendation for repeat in 7 years Reports receiving first shingles vaccine at local [facility] in September, has not had second dose yet Has not had flu shot this year, declines at this time Thinks that it has been more than 10 years since her last tetanus booster, recommend receiving today, prefers to schedule nurse visit Discussed general lifestyle modifications, specifically related to diet and exercise Reports having recent episode of dizziness, was prescribed meclizine by endocrinologist, has not started this.  Requesting referral to ENT With prior labs, was scheduled to have B12 checked, however was not completed, requesting to complete this now Will also check hemoglobin A1c Has had elevated alkaline phosphatase in the past, has been relatively stable.  Defer further evaluation and treatment with PCP.  Could consider checking gamma-glutamyltransferase in order to assess if elevation is hepatobiliary in origin or bone related.  Further evaluation would be determined based upon GGT finding

## 2021-03-06 NOTE — Progress Notes (Signed)
Subjective:    CC: Annual Physical Exam  HPI:  [name] is a 66 y.o. presenting for annual physical  I reviewed the past medical history, family history, social history, surgical history, and allergies today and no changes were needed.  Please see the problem list section below in epic for further details.  Past Medical History: Past Medical History:  Diagnosis Date   ALLERGIC RHINITIS 03/30/2008   Qualifier: Diagnosis of  By: Jenny Reichmann MD, Hunt Oris    Anxiety    Arthritis    neck   Breast cancer [facility]) 2018   Right Breast Cancer   Deep venous thrombosis ([place]) 01/26/2021   DVT (deep venous thrombosis) ([place])    04/2010   Dysarthria 09/22/2012   GERD (gastroesophageal reflux disease)    Headache(784.0) 09/22/2012   Headache(784.0) 09/22/2012   History of hiatal hernia    History of radiation therapy 02/05/2017   02/05/17-03/05/17,  right breast 40.05 Gy in 15 fractions, right breast boost 10 Gy in 5 fractions   Hyperlipidemia    Hypertension    Hyperthyroidism    Iron deficiency anemia    LEG PAIN, RIGHT 04/22/2008   Qualifier: Diagnosis of  By: Jenny Reichmann MD, Hunt Oris    Low back pain 01/26/2021   Malignant tumor of breast ([place]) 11/13/2016   Palpitation 09/24/2013   Palpitations    Personal history of radiation therapy 2018   Right Breast Cancer   Precordial chest pain 06/08/2020   Pulmonary emboli ([place]) 11/20/2016   Pulmonary embolism ([place]) 2010   Urine finding 01/26/2021   Uterine fibroid    Vitamin D deficiency    Graves disease   Past Surgical History: Past Surgical History:  Procedure Laterality Date   BREAST LUMPECTOMY Right 12/16/2016   BREAST LUMPECTOMY WITH RADIOACTIVE SEED AND SENTINEL LYMPH NODE BIOPSY Right 12/16/2016   Procedure: RIGHT BREAST LUMPECTOMY WITH RADIOACTIVE SEED AND SENTINEL LYMPH NODE BIOPSY;  Surgeon: Rolm Bookbinder, MD;  Location: [facility];  Service: General;  Laterality: Right;   CHOLECYSTECTOMY     COLONOSCOPY     DILATION AND CURETTAGE OF  UTERUS     01/2008   ESOPHAGOGASTRODUODENOSCOPY     ivc filter     Social History: Social History   Socioeconomic History   Marital status: Single    Spouse name: Not on file   Number of children: Not on file   Years of education: college   Highest education level: Not on file  Occupational History   Occupation: TAX SPECIALIST    Employer: [employer]  Tobacco Use   Smoking status: Former    Packs/day: 0.40    Years: 10.00    Pack years: 4.00    Types: Cigarettes    Quit date: 06/01/2002    Years since quitting: 18.7   Smokeless tobacco: Never  Vaping Use   Vaping Use: Never used  Substance and Sexual Activity   Alcohol use: Yes    Comment: occasional 1 drink per month   Drug use: No   Sexual activity: Not on file  Other Topics Concern   Not on file  Social History Narrative   Lives alone.    Social Determinants of Health   Financial Resource Strain: Not on file  Food Insecurity: Not on file  Transportation Needs: Not on file  Physical Activity: Not on file  Stress: Not on file  Social Connections: Not on file   Family History: Family History  Problem Relation Age of Onset  Drug abuse Brother    Hypertension Mother    Colon cancer Maternal Grandfather    Allergies: Allergies  Allergen Reactions   Amoxicillin-Pot Clavulanate Other (See Comments)    Dizziness    Niacin-Lovastatin Er Other (See Comments)    Caused flushing    Medications: See med rec.  Review of Systems: No headache, visual changes, nausea, vomiting, diarrhea, constipation, dizziness, abdominal pain, skin rash, fevers, chills, night sweats, swollen lymph nodes, weight loss, chest pain, body aches, joint swelling, muscle aches, shortness of breath, mood changes, visual or auditory hallucinations.  Objective:    BP 138/70    Pulse 63    Ht 5\' 6"  (1.676 m)    Wt 182 lb (82.6 kg)    LMP 06/18/2010    SpO2 99%    BMI 29.38 kg/m   General: Well Developed, well nourished, and in no acute  distress.  Neuro: Alert and oriented x3, extra-ocular muscles intact, sensation grossly intact. Cranial nerves II through XII are intact, motor, sensory, and coordinative functions are all intact. HEENT: Normocephalic, atraumatic, pupils equal round reactive to light, neck supple, no masses, no lymphadenopathy, thyroid nonpalpable. Oropharynx, nasopharynx, external ear canals are unremarkable. Skin: Warm and dry, no rashes noted.  Cardiac: Regular rate and rhythm, no murmurs rubs or gallops.  Respiratory: Clear to auscultation bilaterally. Not using accessory muscles, speaking in full sentences.  Abdominal: Soft, nontender, nondistended, positive bowel sounds, no masses, no organomegaly.  Musculoskeletal: Shoulder, elbow, wrist, hip, knee, ankle stable, and with full range of motion.  Impression and Recommendations:    Wellness examination The patient was counseled, risk factors were discussed, anticipatory guidance given. Recommend regular dental and vision screenings Reports being up-to-date on colonoscopy with most recent screening completed by Dr. Collene Mares in 2020 with recommendation for repeat in 7 years Reports receiving first shingles vaccine at local [place] in September, has not had second dose yet Has not had flu shot this year, declines at this time Thinks that it has been more than 10 years since her last tetanus booster, recommend receiving today, prefers to schedule nurse visit Discussed general lifestyle modifications, specifically related to diet and exercise Reports having recent episode of dizziness, was prescribed meclizine by endocrinologist, has not started this.  Requesting referral to ENT With prior labs, was scheduled to have B12 checked, however was not completed, requesting to complete this now Will also check hemoglobin A1c Has had elevated alkaline phosphatase in the past, has been relatively stable.  Defer further evaluation and treatment with PCP.  Could  consider checking gamma-glutamyltransferase in order to assess if elevation is hepatobiliary in origin or bone related.  Further evaluation would be determined based upon GGT finding  Plan for nurse visit for tetanus in the next 2 to 3 weeks.  Plan for follow-up with PCP in 3 to 4 months or sooner as needed   ___________________________________________ Ryaan Vanwagoner de [place], MD, ABFM, CAQSM [facility] [place]

## 2021-03-13 LAB — HEMOGLOBIN A1C
Est. average glucose Bld gHb Est-mCnc: 137 mg/dL
Hgb A1c MFr Bld: 6.4 % — ABNORMAL HIGH (ref 4.8–5.6)

## 2021-03-13 LAB — VITAMIN B12: Vitamin B-12: 484 pg/mL (ref 232–1245)

## 2021-03-27 ENCOUNTER — Ambulatory Visit (HOSPITAL_BASED_OUTPATIENT_CLINIC_OR_DEPARTMENT_OTHER): Payer: Federal, State, Local not specified - PPO

## 2021-04-24 ENCOUNTER — Other Ambulatory Visit: Payer: Self-pay | Admitting: Hematology and Oncology

## 2021-04-24 DIAGNOSIS — Z79811 Long term (current) use of aromatase inhibitors: Secondary | ICD-10-CM

## 2021-04-24 DIAGNOSIS — C50511 Malignant neoplasm of lower-outer quadrant of right female breast: Secondary | ICD-10-CM

## 2021-04-26 ENCOUNTER — Ambulatory Visit
Admission: RE | Admit: 2021-04-26 | Discharge: 2021-04-26 | Disposition: A | Discharge status: Home / Self Care | Payer: Federal, State, Local not specified - PPO | Source: Ambulatory Visit | Attending: Oncology | Admitting: Oncology

## 2021-05-01 ENCOUNTER — Ambulatory Visit (INDEPENDENT_AMBULATORY_CARE_PROVIDER_SITE_OTHER): Payer: Federal, State, Local not specified - PPO | Admitting: Family

## 2021-05-01 ENCOUNTER — Encounter (HOSPITAL_BASED_OUTPATIENT_CLINIC_OR_DEPARTMENT_OTHER): Payer: Self-pay | Admitting: Family

## 2021-05-01 ENCOUNTER — Other Ambulatory Visit: Payer: Self-pay

## 2021-05-01 VITALS — BP 112/72 | HR 64 | Ht 66.0 in | Wt 176.5 lb

## 2021-05-01 DIAGNOSIS — E782 Mixed hyperlipidemia: Secondary | ICD-10-CM

## 2021-05-01 DIAGNOSIS — Z86718 Personal history of other venous thrombosis and embolism: Secondary | ICD-10-CM | POA: Diagnosis not present

## 2021-05-01 DIAGNOSIS — I10 Essential (primary) hypertension: Secondary | ICD-10-CM | POA: Diagnosis not present

## 2021-05-01 DIAGNOSIS — R600 Localized edema: Secondary | ICD-10-CM

## 2021-05-01 NOTE — Progress Notes (Signed)
? ?Office Visit  ?  ?Patient Name: [name] ?Date of Encounter: 05/01/2021 ? ?PCP:  Orma Render, NP ?  ?[place]  ?Cardiologist:  Minus Breeding, MD  ?Advanced Practice Provider:  No care team member to display ?Electrophysiologist:  None  ?   ? ?Chief Complaint  ?  ?[name] is a 66 y.o. female with a hx of palpitations, anxiety, breast cancer, DVT, hypertension, hyperlipidemia, iron deficiency anemia presents today for LE edema.   ?Past Medical History  ?  ?Past Medical History:  ?Diagnosis Date  ? ALLERGIC RHINITIS 03/30/2008  ? Qualifier: Diagnosis of  By: Jenny Reichmann MD, Hunt Oris   ? Anxiety   ? Arthritis   ? neck  ? Breast cancer ([place]) 2018  ? Right Breast Cancer  ? Deep venous thrombosis ([place]) 01/26/2021  ? DVT (deep venous thrombosis) ([place])   ? 04/2010  ? Dysarthria 09/22/2012  ? GERD (gastroesophageal reflux disease)   ? Headache(784.0) 09/22/2012  ? Headache(784.0) 09/22/2012  ? History of hiatal hernia   ? History of radiation therapy 02/05/2017  ? 02/05/17-03/05/17,  right breast 40.05 Gy in 15 fractions, right breast boost 10 Gy in 5 fractions  ? Hyperlipidemia   ? Hypertension   ? Hyperthyroidism   ? Iron deficiency anemia   ? LEG PAIN, RIGHT 04/22/2008  ? Qualifier: Diagnosis of  By: Jenny Reichmann MD, Hunt Oris   ? Low back pain 01/26/2021  ? Malignant tumor of breast ([place]) 11/13/2016  ? Palpitation 09/24/2013  ? Palpitations   ? Personal history of radiation therapy 2018  ? Right Breast Cancer  ? Precordial chest pain 06/08/2020  ? Pulmonary emboli ([place]) 11/20/2016  ? Pulmonary embolism ([place]) 2010  ? Urine finding 01/26/2021  ? Uterine fibroid   ? Vitamin D deficiency   ? Graves disease  ? ?Past Surgical History:  ?Procedure Laterality Date  ? BREAST LUMPECTOMY Right 12/16/2016  ? BREAST LUMPECTOMY WITH RADIOACTIVE SEED AND SENTINEL LYMPH NODE BIOPSY Right 12/16/2016  ? Procedure: RIGHT BREAST LUMPECTOMY WITH RADIOACTIVE SEED AND SENTINEL LYMPH NODE BIOPSY;  Surgeon: Rolm Bookbinder,  MD;  Location: [facility];  Service: General;  Laterality: Right;  ? CHOLECYSTECTOMY    ? COLONOSCOPY    ? DILATION AND CURETTAGE OF UTERUS    ? 01/2008  ? ESOPHAGOGASTRODUODENOSCOPY    ? ivc filter    ? ? ?Allergies ? ?Allergies  ?Allergen Reactions  ? Amoxicillin-Pot Clavulanate Other (See Comments)  ?  Dizziness ?  ? Niacin-Lovastatin Er Other (See Comments)  ?  Caused flushing ?  ? ? ?History of Present Illness  ?  ?[name] is a 67 y.o. female with a hx of palpitations, anxiety, breast cancer, DVT, hypertension, hyperlipidemia, iron deficiency anemia  last seen 12/28/20 by Dr. Percival Spanish. ? ?She was last seen in clinic 12/2020 by Dr. Percival Spanish.  Since prior ZIO 06/2020 revealing sinus rhythm and rare PVC/PAC she had had no recurrent palpitations.  The role of exercise and stress and palpitations was discussed.  She was recommended for follow-up in 1 year. ? ?She presents today for follow-up.  Shares with me that she recently took a train to [place] then drove to [place].  She has had more swelling in her right leg and worries that she has recurrent DVT.  She does have IVC filter in place.  Previous DVT/PE was in the setting of being on estrogen therapy.  No change in color or temperature of the leg.  No chest pain, shortness of breath, orthopnea, PND. ? ? ?EKGs/Labs/Other Studies Reviewed:  ? ?The following studies were reviewed today: ? ?EKG:  No EKG today.  ? ?Recent Labs: ?03/02/2021: ALT 20; BUN 14; Creatinine, Ser 0.85; Hemoglobin 12.6; Platelets 163.0; Potassium 4.1; Sodium 136; TSH 1.13  ?Recent Lipid Panel ?   ?Component Value Date/Time  ? CHOL 153 01/26/2021 1025  ? TRIG 103 01/26/2021 1025  ? HDL 49 01/26/2021 1025  ? CHOLHDL 3.1 01/26/2021 1025  ? CHOLHDL 4.4 04/06/2010 1037  ? VLDL 16 04/06/2010 1037  ? [place] 85 01/26/2021 1025  ? ? ?Home Medications  ? ?Current Meds  ?Medication Sig  ? anastrozole (ARIMIDEX) 1 MG tablet TAKE 1 TABLET BY MOUTH EVERY DAY  ? Ascorbic Acid (VITAMIN C) 1000 MG tablet  Take 1,000 mg by mouth daily.  ? aspirin EC 81 MG tablet Take 1 tablet (81 mg total) by mouth daily.  ? atorvastatin (LIPITOR) 20 MG tablet Take 20 mg by mouth daily.  ? candesartan (ATACAND) 8 MG tablet Take 1 tablet (8 mg total) by mouth daily.  ? Cholecalciferol (VITAMIN D-3 PO) Take 1 capsule by mouth daily.  ? hydrALAZINE (APRESOLINE) 10 MG tablet Take 0.5 tablets (5 mg total) by mouth as needed (for diastolic greater than 683 for up to 2 doses.).  ? LORazepam (ATIVAN) 0.5 MG tablet Take 0.5 mg by mouth 2 (two) times daily as needed.  ? meclizine (ANTIVERT) 25 MG tablet Take 1 tablet (25 mg total) by mouth 2 (two) times daily as needed for dizziness.  ? methimazole (TAPAZOLE) 5 MG tablet Take 0.5 tablets (2.5 mg total) by mouth daily.  ? Omega-3 Fatty Acids (OMEGA-3 FISH OIL PO) Take 2,000 mg by mouth.  ?  ? ?Review of Systems  ?    ?All other systems reviewed and are otherwise negative except as noted above. ? ?Physical Exam  ?  ?VS:  BP 112/72 (BP Location: Left Arm, Patient Position: Sitting)   Pulse 64   Ht '5\' 6"'$  (1.676 m)   Wt 176 lb 8 oz (80.1 kg)   LMP 06/18/2010   SpO2 98%   BMI 28.49 kg/m?  , BMI Body mass index is 28.49 kg/m?. ? ?Wt Readings from Last 3 Encounters:  ?05/01/21 176 lb 8 oz (80.1 kg)  ?03/06/21 182 lb (82.6 kg)  ?03/02/21 180 lb (81.6 kg)  ?  ? ?GEN: Well nourished, well developed, in no acute distress. ?HEENT: normal. ?Neck: Supple, no JVD, carotid bruits, or masses. ?Cardiac: RRR, no murmurs, rubs, or gallops. No clubbing, cyanosis. RLE with trace edema. Varicose veins noted to bilateral LE.  Radials/PT 2+ and equal bilaterally.  ?Respiratory:  Respirations regular and unlabored, clear to auscultation bilaterally. ?GI: Soft, nontender, nondistended. ?MS: No deformity or atrophy. ?Skin: Warm and dry, no rash. ?Neuro:  Strength and sensation are intact. ?Psych: Normal affect. ? ?Assessment & Plan  ?  ?History of DVT/lower extremity edema -prior DVT and PE in the setting of estrogen  therapy.  Recent prolonged travel with RLE swelling.  Anticipate some element of venous insufficiency, dependent edema, prior DVT as contributory.  No erythema suggestive of cellulitis.  Prior duplex 01/2018 with evidence of chronic changes from previous clot.  Low suspicion for acute DVT given only mild edema but will update lower extremity duplex. Recommend elevate extremity, compression stockings.  ? ?HTN - BP well controlled. Continue current antihypertensive regimen.   ? ?Palpitations-overall quiescent.  Continue to avoid caffeine, manage stress well, stay  well-hydrated.  Monitor 06/2020 with no significant arrhythmia.  No indication for further work-up at this time. ? ? ?Disposition: Follow up  12/2021  with Minus Breeding, MD or APP. ? ?Signed, ?Loel Dubonnet, NP ?05/01/2021, 1:58 PM ?[place] ?

## 2021-05-01 NOTE — Patient Instructions (Addendum)
Medication Instructions:  ?Continue your current medications.  ? ?You may continue the half tablet of your Candesartan unless your blood pressure is consistently more than 130/80 then you should go back to a whole tablet.  ? ?*If you need a refill on your cardiac medications before your next appointment, please call your pharmacy* ? ?Lab Work: ?None ordered today.  ? ?Testing/Procedures: ?Your provider recommends a lower extremity ultrasound. This will help [place] to evaluate for a DVT or a a possible Baker's Cyst.  ? ?Follow-Up: ?At [facility], you and your health needs are our priority.  As part of our continuing mission to provide you with exceptional heart care, we have created designated Provider Care Teams.  These Care Teams include your primary Cardiologist (physician) and Advanced Practice Providers (APPs -  Physician Assistants and Nurse Practitioners) who all work together to provide you with the care you need, when you need it. ? ?We recommend signing up for the patient portal called "MyChart".  Sign up information is provided on this After Visit Summary.  MyChart is used to connect with patients for Virtual Visits (Telemedicine).  Patients are able to view lab/test results, encounter notes, upcoming appointments, etc.  Non-urgent messages can be sent to your provider as well.   ?To learn more about what you can do with MyChart, go to [email or web address].   ? ?Your next appointment:   ?November 2023 with Dr. Percival Spanish ? ? ?Other Instructions ? ?To prevent or reduce lower extremity swelling: ?Eat a low salt diet. Salt makes the body hold onto extra fluid which causes swelling. ?Sit with legs elevated. For example, in the recliner or on an Mobile.  ?Wear knee-high compression stockings during the daytime. Ones labeled 15-20 mmHg provide good compression.  ? ?Heart Healthy Diet Recommendations: ?A low-salt diet is recommended. Meats should be grilled, baked, or boiled. Avoid fried foods. Focus on lean  protein sources like fish or chicken with vegetables and fruits. The American Heart Association is a Microbiologist!  American Heart Association Diet and Lifeystyle Recommendations   ? ?Exercise recommendations: ?The American Heart Association recommends 150 minutes of moderate intensity exercise weekly. ?Try 30 minutes of moderate intensity exercise 4-5 times per week. ?This could include walking, jogging, or swimming. ?  ?

## 2021-05-02 ENCOUNTER — Ambulatory Visit (HOSPITAL_COMMUNITY)
Admission: RE | Admit: 2021-05-02 | Discharge: 2021-05-02 | Disposition: A | Discharge status: Home / Self Care | Payer: Federal, State, Local not specified - PPO | Source: Ambulatory Visit | Attending: Cardiovascular Disease | Admitting: Cardiovascular Disease

## 2021-05-02 DIAGNOSIS — Z86718 Personal history of other venous thrombosis and embolism: Secondary | ICD-10-CM | POA: Insufficient documentation

## 2021-05-02 DIAGNOSIS — R600 Localized edema: Secondary | ICD-10-CM | POA: Diagnosis not present

## 2021-05-07 ENCOUNTER — Telehealth (HOSPITAL_BASED_OUTPATIENT_CLINIC_OR_DEPARTMENT_OTHER): Payer: Self-pay

## 2021-05-07 NOTE — Telephone Encounter (Addendum)
Results called to patient who verbalizes understanding!  ? ? ? ?----- Message from Loel Dubonnet, NP sent at 05/04/2021  7:52 AM EDT ----- ?Study unchanged compared to prior in 2019. No evidence of new DVT. Stable chronic DVT in the right popliteal vein which is not of concern. Recommend continuing to elevate the leg and wear compression stockings.  ?

## 2021-05-11 ENCOUNTER — Telehealth: Payer: Self-pay | Admitting: Cardiology

## 2021-05-11 NOTE — Telephone Encounter (Signed)
Patient call to say that she has discomfort in the morning with her knee. And she said the veins has risen around her knee where she had the blood clot before. Please advise ?

## 2021-05-11 NOTE — Telephone Encounter (Signed)
Contacted patient, who states that she is having pain in her knee, she has been having it and it comes and goes. She denies redness, but has swelling in her knee and states "skin color changes" like a bruise as come up. Patient states it does not have heat to is, and no other complaints, but she feels she has a blood clot in her leg again. I did advise patient she recently had scans 1 week ago, and it was clear of DVT. Patient states she wanted an appointment for Monday, but unable to give this- I did offer APP appointment for 04/05 but patient refused this, she will go to the urgent care PCP to be evaluated.  ? ?Patient advised to call back with any updates or contact after hours if needed.  ? ?

## 2021-05-28 ENCOUNTER — Other Ambulatory Visit: Payer: Self-pay | Admitting: *Deleted

## 2021-05-28 DIAGNOSIS — C50511 Malignant neoplasm of lower-outer quadrant of right female breast: Secondary | ICD-10-CM

## 2021-05-30 ENCOUNTER — Encounter: Payer: Self-pay | Admitting: Hematology and Oncology

## 2021-05-30 ENCOUNTER — Inpatient Hospital Stay (HOSPITAL_BASED_OUTPATIENT_CLINIC_OR_DEPARTMENT_OTHER): Payer: Federal, State, Local not specified - PPO | Admitting: Hematology and Oncology

## 2021-05-30 ENCOUNTER — Other Ambulatory Visit: Payer: Self-pay

## 2021-05-30 ENCOUNTER — Inpatient Hospital Stay: Payer: Federal, State, Local not specified - PPO | Attending: Hematology and Oncology

## 2021-05-30 VITALS — BP 148/82 | HR 64 | Temp 97.7°F | Resp 16 | Ht 66.0 in | Wt 175.8 lb

## 2021-05-30 DIAGNOSIS — Z79811 Long term (current) use of aromatase inhibitors: Secondary | ICD-10-CM | POA: Diagnosis not present

## 2021-05-30 DIAGNOSIS — Z86718 Personal history of other venous thrombosis and embolism: Secondary | ICD-10-CM | POA: Diagnosis not present

## 2021-05-30 DIAGNOSIS — Z8616 Personal history of COVID-19: Secondary | ICD-10-CM | POA: Insufficient documentation

## 2021-05-30 DIAGNOSIS — Z86711 Personal history of pulmonary embolism: Secondary | ICD-10-CM | POA: Insufficient documentation

## 2021-05-30 DIAGNOSIS — C50511 Malignant neoplasm of lower-outer quadrant of right female breast: Secondary | ICD-10-CM | POA: Insufficient documentation

## 2021-05-30 DIAGNOSIS — Z87891 Personal history of nicotine dependence: Secondary | ICD-10-CM | POA: Insufficient documentation

## 2021-05-30 DIAGNOSIS — M8580 Other specified disorders of bone density and structure, unspecified site: Secondary | ICD-10-CM | POA: Diagnosis not present

## 2021-05-30 DIAGNOSIS — Z923 Personal history of irradiation: Secondary | ICD-10-CM | POA: Diagnosis not present

## 2021-05-30 DIAGNOSIS — Z170 Estrogen receptor positive status [ER+]: Secondary | ICD-10-CM

## 2021-05-30 LAB — CMP (CANCER CENTER ONLY)
ALT: 14 U/L (ref 0–44)
AST: 14 U/L — ABNORMAL LOW (ref 15–41)
Albumin: 4.2 g/dL (ref 3.5–5.0)
Alkaline Phosphatase: 147 U/L — ABNORMAL HIGH (ref 38–126)
Anion gap: 5 (ref 5–15)
BUN: 13 mg/dL (ref 8–23)
CO2: 28 mmol/L (ref 22–32)
Calcium: 9.7 mg/dL (ref 8.9–10.3)
Chloride: 107 mmol/L (ref 98–111)
Creatinine: 0.88 mg/dL (ref 0.44–1.00)
GFR, Estimated: >60 mL/min (ref >60)
Glucose, Bld: 114 mg/dL — ABNORMAL HIGH (ref 70–99)
Potassium: 4.1 mmol/L (ref 3.5–5.1)
Sodium: 140 mmol/L (ref 135–145)
Total Bilirubin: 0.5 mg/dL (ref 0.3–1.2)
Total Protein: 8.1 g/dL (ref 6.5–8.1)

## 2021-05-30 LAB — CBC WITH DIFFERENTIAL (CANCER CENTER ONLY)
Abs Immature Granulocytes: 0.01 10*3/uL (ref 0.00–0.07)
Basophils Absolute: 0 10*3/uL (ref 0.0–0.1)
Basophils Relative: 1 %
Eosinophils Absolute: 0.2 10*3/uL (ref 0.0–0.5)
Eosinophils Relative: 4 %
HCT: 37.7 % (ref 36.0–46.0)
Hemoglobin: 12.3 g/dL (ref 12.0–15.0)
Immature Granulocytes: 0 %
Lymphocytes Relative: 44 %
Lymphs Abs: 1.8 10*3/uL (ref 0.7–4.0)
MCH: 31.4 pg (ref 26.0–34.0)
MCHC: 32.6 g/dL (ref 30.0–36.0)
MCV: 96.2 fL (ref 80.0–100.0)
Monocytes Absolute: 0.2 10*3/uL (ref 0.1–1.0)
Monocytes Relative: 6 %
Neutro Abs: 1.9 10*3/uL (ref 1.7–7.7)
Neutrophils Relative %: 45 %
Platelet Count: 139 10*3/uL — ABNORMAL LOW (ref 150–400)
RBC: 3.92 MIL/uL (ref 3.87–5.11)
RDW: 12.9 % (ref 11.5–15.5)
WBC Count: 4.2 10*3/uL (ref 4.0–10.5)
nRBC: 0 % (ref 0.0–0.2)

## 2021-05-30 NOTE — Progress Notes (Signed)
? ?[place]  ?Telephone:([phone]) [phone] [phone]  ? ? ? ?ID: [name] DOB: 1956/01/19  MR#: [id number]  QMV#:[id number] ? ?Patient Care Team: ?Early, Coralee Pesa, NP as PCP - General (Nurse Practitioner) ?Minus Breeding, MD as PCP - Cardiology (Cardiology) ?Rolm Bookbinder, MD as Consulting Physician (General Surgery) ?Magrinat, Virgie Dad, MD (Inactive) as Consulting Physician (Oncology) ?Gery Pray, MD as Consulting Physician (Radiation Oncology) ?Bobbye Charleston, MD as Consulting Physician (Obstetrics and Gynecology) ?OTHER MD:  ? ?CHIEF COMPLAINT: Estrogen receptor positive breast cancer ? ?CURRENT TREATMENT: anastrozole ? ? ?INTERVAL HISTORY: ?[name] returns today for follow-up of her estrogen receptor positive breast cancer.  ?She continues on anastrozole.  She has been tolerating this very well.  No hot flashes/vaginal dryness or arthralgias. ?She denies any changes in her breast. ?Last mammogram in October 2022, stable postsurgical changes in the right breast no mammographic evidence of malignancy bilaterally ?Most recent bone density showed osteopenia.  T score of -1.3. ?Rest of the pertinent 10 point ROS reviewed and negative ? ? ? ?HISTORY OF CURRENT ILLNESS: ?From the original intake note: ? ?[name] had routine bilateral screening mammography with tomography at the [facility] 10/08/2016. An area of possible distortion in the right breast was noted. She was recalled for right diagnostic mammography with tomography on 11/08/2016. This found the breast density to be category C. The small area of architectural distortion in the lateral right breast persisted and on 11/11/2016 biopsy of this area showed ([id number]) invasive ductal carcinoma, E-cadherin positive, estrogen receptor 95% positive with strong staining intensity, progesterone receptor 5% positive, with strong staining intensity, with an MIB-1 of 10%, and no HER-2 amplification, with a signals ratio of 1.51 and  number per cell 3.40. Right axillary ultrasound 11/13/2016 was sonographically benign. ? ?Of note, she has a history of DVT diagnosed March 2010, felt to be possibly related to estrogen replacement therapy. She was coumadinized but developed a pulmonary embolus while on Coumadin. She was then had an IVC filter placed and was anticoagulated with Lovenox for 2 years. An extensive hypercoagulable workup was negative except for an elevated factor VIII level. Her IVC filter is still in place and the patient tells me she is participating in a lawsuit regarding that.  ? ?The patient's subsequent history is as detailed below. ? ? ?PAST MEDICAL HISTORY: ?Past Medical History:  ?Diagnosis Date  ? ALLERGIC RHINITIS 03/30/2008  ? Qualifier: Diagnosis of  By: Laura Reichmann MD, Hunt Oris   ? Anxiety   ? Arthritis   ? neck  ? Breast cancer ([place]) 2018  ? Right Breast Cancer  ? Deep venous thrombosis ([place]) 01/26/2021  ? DVT (deep venous thrombosis) ([place])   ? 04/2010  ? Dysarthria 09/22/2012  ? GERD (gastroesophageal reflux disease)   ? Headache(784.0) 09/22/2012  ? Headache(784.0) 09/22/2012  ? History of hiatal hernia   ? History of radiation therapy 02/05/2017  ? 02/05/17-03/05/17,  right breast 40.05 Gy in 15 fractions, right breast boost 10 Gy in 5 fractions  ? Hyperlipidemia   ? Hypertension   ? Hyperthyroidism   ? Iron deficiency anemia   ? LEG PAIN, RIGHT 04/22/2008  ? Qualifier: Diagnosis of  By: Laura Reichmann MD, Hunt Oris   ? Low back pain 01/26/2021  ? Malignant tumor of breast ([place]) 11/13/2016  ? Palpitation 09/24/2013  ? Palpitations   ? Personal history of radiation therapy 2018  ? Right Breast Cancer  ? Precordial chest pain 06/08/2020  ? Pulmonary emboli ([place]) 11/20/2016  ?  Pulmonary embolism ([place]) 2010  ? Urine finding 01/26/2021  ? Uterine fibroid   ? Vitamin D deficiency   ? Graves disease  ? ? ?PAST SURGICAL HISTORY: ?Past Surgical History:  ?Procedure Laterality Date  ? BREAST LUMPECTOMY Right 12/16/2016  ? BREAST LUMPECTOMY WITH RADIOACTIVE  SEED AND SENTINEL LYMPH NODE BIOPSY Right 12/16/2016  ? Procedure: RIGHT BREAST LUMPECTOMY WITH RADIOACTIVE SEED AND SENTINEL LYMPH NODE BIOPSY;  Surgeon: Rolm Bookbinder, MD;  Location: [facility];  Service: General;  Laterality: Right;  ? CHOLECYSTECTOMY    ? COLONOSCOPY    ? DILATION AND CURETTAGE OF UTERUS    ? 01/2008  ? ESOPHAGOGASTRODUODENOSCOPY    ? ivc filter    ? ? ?FAMILY HISTORY ?Family History  ?Problem Relation Age of Onset  ? Drug abuse Brother   ? Hypertension Mother   ? Colon cancer Maternal Grandfather   ?She notes that her father died at age 85 from an accidental head injury.The patient's mother is 46 years old as of October 2018. Pt has one brother and no sisters. Pt reports that a second cousin has a hx of breast cancer and was dx in her 60's. Pt denies family hx of ovarian cancer. ? ? ?GYNECOLOGIC HISTORY:  ?Patient's last menstrual period was 06/18/2010. ?Menarche: 66 years old ?Age at first live birth: No children ?GP: GXP0 ?LMP: March 2016 ?Contraceptive: OCP on and off for many years d/c after DVT and PE diagnosis.  ?HRT: No  ? ? 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  She lives by herself.  She keeps no pets ? ? ADVANCED DIRECTIVES: Not in place. At the 11/20/2016 visit the patient was given the appropriate documents to complete and notarize at her discretion ? ? ?HEALTH MAINTENANCE: ?Social History  ? ?Tobacco Use  ? Smoking status: Former  ?  Packs/day: 0.40  ?  Years: 10.00  ?  Pack years: 4.00  ?  Types: Cigarettes  ?  Quit date: 06/01/2002  ?  Years since quitting: 19.0  ? Smokeless tobacco: Never  ?Vaping Use  ? Vaping Use: Never used  ?Substance Use Topics  ? Alcohol use: Yes  ?  Comment: occasional 1 drink per month  ? Drug use: No  ? ? ? Colonoscopy: 2013 ? PAP: 08/30/2016  ? Bone density: 10/04/2015 with T-score of -0.3 at femur neck right ?  ?Allergies  ?Allergen Reactions  ? Amoxicillin-Pot Clavulanate  Other (See Comments)  ?  Dizziness ?  ? Niacin-Lovastatin Er Other (See Comments)  ?  Caused flushing ?  ? ? ?Current Outpatient Medications  ?Medication Sig Dispense Refill  ? anastrozole (ARIMIDEX) 1 MG tablet TAKE 1 TABLET BY MOUTH EVERY DAY 90 tablet 4  ? Ascorbic Acid (VITAMIN C) 1000 MG tablet Take 1,000 mg by mouth daily.    ? aspirin EC 81 MG tablet Take 1 tablet (81 mg total) by mouth daily. 90 tablet 3  ? atorvastatin (LIPITOR) 20 MG tablet Take 20 mg by mouth daily.    ? candesartan (ATACAND) 8 MG tablet Take 1 tablet (8 mg total) by mouth daily.    ? Cholecalciferol (VITAMIN D-3 PO) Take 1 capsule by mouth daily.    ? FLUoxetine (PROZAC) 20 MG capsule Take 20 mg by mouth daily. (Patient not taking: Reported on 05/01/2021)    ? hydrALAZINE (APRESOLINE) 10 MG tablet Take 0.5 tablets (5 mg total) by mouth as needed (for diastolic  greater than 110 for up to 2 doses.). 30 tablet 3  ? LORazepam (ATIVAN) 0.5 MG tablet Take 0.5 mg by mouth 2 (two) times daily as needed.    ? meclizine (ANTIVERT) 25 MG tablet Take 1 tablet (25 mg total) by mouth 2 (two) times daily as needed for dizziness. 10 tablet 0  ? methimazole (TAPAZOLE) 5 MG tablet Take 0.5 tablets (2.5 mg total) by mouth daily. 45 tablet 2  ? Omega-3 Fatty Acids (OMEGA-3 FISH OIL PO) Take 2,000 mg by mouth.    ? Polyethyl Glycol-Propyl Glycol (SYSTANE OP) Place 1 drop into both eyes daily as needed (dry eyes).  (Patient not taking: Reported on 05/01/2021)    ? ?No current facility-administered medications for this visit.  ? ? ?OBJECTIVE:  African-American woman in no acute distress ? ?Vitals:  ? 05/30/21 1136  ?BP: (!) 148/82  ?Pulse: 64  ?Resp: 16  ?Temp: 97.7 ?F (36.5 ?C)  ?SpO2: 100%  ? ?   Body mass index is 28.37 kg/m?.   ?Wt Readings from Last 3 Encounters:  ?05/30/21 175 lb 12.8 oz (79.7 kg)  ?05/01/21 176 lb 8 oz (80.1 kg)  ?03/06/21 182 lb (82.6 kg)  ? ?  ECOG FS:1 - Symptomatic but completely ambulatory ? ?Physical Exam ?Constitutional:   ?    Appearance: Normal appearance.  ?Chest:  ?   Comments: Bilateral breasts inspected.  No palpable masses or regional adenopathy ?Musculoskeletal:     ?   General: No swelling.  ?   Cervical back: Normal ra

## 2021-05-31 ENCOUNTER — Telehealth: Payer: Self-pay | Admitting: Hematology and Oncology

## 2021-05-31 NOTE — Telephone Encounter (Signed)
Scheduled appointment per 04/19 los. Left message.   ?

## 2021-07-03 ENCOUNTER — Ambulatory Visit (HOSPITAL_BASED_OUTPATIENT_CLINIC_OR_DEPARTMENT_OTHER): Payer: Federal, State, Local not specified - PPO | Admitting: Nurse Practitioner

## 2021-07-03 ENCOUNTER — Encounter (HOSPITAL_BASED_OUTPATIENT_CLINIC_OR_DEPARTMENT_OTHER): Payer: Self-pay | Admitting: Nurse Practitioner

## 2021-07-03 VITALS — BP 122/72 | HR 86 | Ht 66.0 in | Wt 177.0 lb

## 2021-07-03 DIAGNOSIS — F419 Anxiety disorder, unspecified: Secondary | ICD-10-CM

## 2021-07-03 DIAGNOSIS — I10 Essential (primary) hypertension: Secondary | ICD-10-CM | POA: Diagnosis not present

## 2021-07-03 DIAGNOSIS — R011 Cardiac murmur, unspecified: Secondary | ICD-10-CM

## 2021-07-03 DIAGNOSIS — E0500 Thyrotoxicosis with diffuse goiter without thyrotoxic crisis or storm: Secondary | ICD-10-CM

## 2021-07-03 DIAGNOSIS — E785 Hyperlipidemia, unspecified: Secondary | ICD-10-CM

## 2021-07-03 DIAGNOSIS — D508 Other iron deficiency anemias: Secondary | ICD-10-CM | POA: Diagnosis not present

## 2021-07-03 DIAGNOSIS — R7303 Prediabetes: Secondary | ICD-10-CM

## 2021-07-03 DIAGNOSIS — R42 Dizziness and giddiness: Secondary | ICD-10-CM

## 2021-07-03 DIAGNOSIS — E538 Deficiency of other specified B group vitamins: Secondary | ICD-10-CM | POA: Diagnosis not present

## 2021-07-03 DIAGNOSIS — E559 Vitamin D deficiency, unspecified: Secondary | ICD-10-CM

## 2021-07-03 NOTE — Progress Notes (Signed)
Laura Keeler, DNP, AGNP-c [facility] [address] [place], [place] [address] [phone] Office [phone] Fax  ESTABLISHED PATIENT- Chronic Health and/or Follow-Up Visit  Blood pressure 122/72, pulse 86, height '5\' 6"'$  (1.676 m), weight 177 lb (80.3 kg), last menstrual period 06/18/2010, SpO2 99 %.  Follow-up (Patient is here to follow up pre diabetic. Sometimes feels foggy. )   HPI  [name]  is a 66 y.o. year old female presenting today for evaluation and management of the following: Prediabetes Blood sugars are currently diet controlled Has experienced a few episodes of dizziness At the time of dizziness blood sugars are around 80 She has also had an episode of shakiness-unclear what blood sugar was at that time She is managing her diet closely with reduction in carbohydrates and avoiding fats. Brain fog She endorses symptoms of foggy feeling in her brain of unknown etiology She tells me that she is not sleeping very well going to bed around 1 AM and getting up between 4 AM and 6 AM She reports that she cannot really put her finger on it but definitely feels off Precordial pain She endorses intermittent palpitations and precordial chest pain She has had this ongoing for quite some time She tells me that she has had EKGs in the past with previous primary care provider which were always normal She would like an EKG today if possible  ROS All ROS negative with exception of what is listed in HPI  PHYSICAL EXAM Physical Exam Vitals and nursing note reviewed.  Constitutional:      General: She is not in acute distress.    Appearance: Normal appearance.  HENT:     Head: Normocephalic.     Right Ear: Tympanic membrane normal.     Left Ear: Tympanic membrane normal.  Eyes:     Extraocular Movements: Extraocular movements intact.     Conjunctiva/sclera: Conjunctivae normal.     Pupils: Pupils are equal, round, and  reactive to light.  Neck:     Vascular: No carotid bruit.  Cardiovascular:     Rate and Rhythm: Normal rate and regular rhythm.     Pulses: Normal pulses.     Heart sounds: Murmur heard.  Pulmonary:     Effort: Pulmonary effort is normal.     Breath sounds: Normal breath sounds. No wheezing.  Abdominal:     General: Bowel sounds are normal. There is no distension.     Palpations: Abdomen is soft.     Tenderness: There is no abdominal tenderness. There is no guarding.  Musculoskeletal:        General: Normal range of motion.     Cervical back: Normal range of motion and neck supple.     Right lower leg: No edema.     Left lower leg: No edema.  Lymphadenopathy:     Cervical: No cervical adenopathy.  Skin:    General: Skin is warm and dry.     Capillary Refill: Capillary refill takes less than 2 seconds.  Neurological:     General: No focal deficit present.     Mental Status: She is alert and oriented to person, place, and time.     Cranial Nerves: No cranial nerve deficit.     Motor: No weakness.     Coordination: Coordination normal.     Gait: Gait normal.     Deep Tendon Reflexes: Reflexes normal.  Psychiatric:        Mood  and Affect: Mood normal.        Behavior: Behavior normal.        Thought Content: Thought content normal.        Judgment: Judgment normal.    ASSESSMENT & PLAN Problem List Items Addressed This Visit     ANEMIA, IRON DEFICIENCY    Chronic.  Given current symptoms we will monitor labs today for evaluation.  No signs of bleeding present at this time.  We will make changes to plan of care based on lab findings.       Relevant Orders   TSH   T4, free   VITAMIN D 25 Hydroxy (Vit-D Deficiency, Fractures)   B12 and Folate Panel   Iron, TIBC and Ferritin Panel   CBC with Differential/Platelet   Hemoglobin A1c   Hypertensive disorder    Chronic.  Blood pressure stable at this time.  Recent episodes of dizziness and precordial chest pain reported.   Upon evaluation symptoms do not appear to be cardiac in nature.  EKG attempted today however unable to obtain accurate reading due to inability of the leads to maintain placement.  Several attempts made.  Patient will return for EKG.  We will obtain labs today.  No alarm symptoms present at this time. HRRR, neurologically intact, pulses normal.        Relevant Orders   TSH   T4, free   VITAMIN D 25 Hydroxy (Vit-D Deficiency, Fractures)   B12 and Folate Panel   Iron, TIBC and Ferritin Panel   CBC with Differential/Platelet   Hemoglobin A1c   Dyslipidemia    Chronic.  Currently on statin therapy.  Continue medication.  Will monitor labs today.       Relevant Orders   TSH   T4, free   VITAMIN D 25 Hydroxy (Vit-D Deficiency, Fractures)   B12 and Folate Panel   Iron, TIBC and Ferritin Panel   CBC with Differential/Platelet   Hemoglobin A1c   Anxiety    Chronic.  Currently has as needed Ativan.  Based on patient's presentation and symptoms today I do suspect that there could be an element of anxiety contributing to the precordial chest pain that she endorses.  This does appear to be long-term however we will monitor labs today.  Patient encouraged to take as needed Ativan if she notices the symptoms of chest pain to see if this helps with improvement.  She is no longer taking daily Prozac.  May consider restart of this or alternative medication if she feels like her anxiety is not well controlled.  She will reach out with any new or worsening symptoms.       Relevant Orders   TSH   T4, free   VITAMIN D 25 Hydroxy (Vit-D Deficiency, Fractures)   B12 and Folate Panel   Iron, TIBC and Ferritin Panel   CBC with Differential/Platelet   Hemoglobin B5Z   Systolic murmur    Chronic.  Patient reports that this has been present her entire adult life.  No alarm symptoms present at this time.HRRR today with no pedal edema.  Pulses intact.  Lungs clear to auscultation.  Unlikely that murmur is  contributing to the symptoms she is experiencing but we can consider referral to cardiology for further evaluation if these continue.  She has seen cardiology in the past and has had complete work-up.  Patient will contact the office if she has new or worsening symptoms or seek emergency care  Relevant Orders   TSH   T4, free   VITAMIN D 25 Hydroxy (Vit-D Deficiency, Fractures)   B12 and Folate Panel   Iron, TIBC and Ferritin Panel   CBC with Differential/Platelet   Hemoglobin A1c   Graves disease - Primary    Chronic.  Currently on methimazole.  We will monitor labs today for evaluation given patient's current symptoms.  No changes in therapy at this time.  Patient instructed to contact the office or seek emergency evaluation if symptoms worsen or new symptoms develop.       Relevant Orders   TSH   T4, free   VITAMIN D 25 Hydroxy (Vit-D Deficiency, Fractures)   B12 and Folate Panel   Iron, TIBC and Ferritin Panel   CBC with Differential/Platelet   Hemoglobin A1c   Vertigo    Intermittent dizziness of unknown etiology.  Patient does have a history of vertigo.  Unclear at this time if this is related to vertigo or possible alternative diagnoses.  We will monitor labs today.  CGM placed on patient to monitor blood sugar levels closely to ensure that these are not dropping unexpectedly contributing to symptoms.  No alarm symptoms present at this time.  No neurological deficits present.       Pre-diabetes    Chronic.  Diet controlled.  Symptoms consistent with low blood sugar.  We will place CGM on patient today to monitor blood sugar levels closely to ensure that she is not having any drops that could be contributing to the symptoms she is experiencing.  She has been instructed to monitor blood sugar closely and treat low blood sugars.  If symptoms worsen or fail to improve patient is aware to follow-up.       Relevant Orders   TSH   T4, free   VITAMIN D 25 Hydroxy (Vit-D  Deficiency, Fractures)   B12 and Folate Panel   Iron, TIBC and Ferritin Panel   CBC with Differential/Platelet   Hemoglobin A1c   Vitamin B 12 deficiency   Relevant Orders   TSH   T4, free   VITAMIN D 25 Hydroxy (Vit-D Deficiency, Fractures)   B12 and Folate Panel   Iron, TIBC and Ferritin Panel   CBC with Differential/Platelet   Hemoglobin A1c   Vitamin D deficiency   Relevant Orders   TSH   T4, free   VITAMIN D 25 Hydroxy (Vit-D Deficiency, Fractures)   B12 and Folate Panel   Iron, TIBC and Ferritin Panel   CBC with Differential/Platelet   Hemoglobin A1c     FOLLOW-UP Return in about 6 months (around 01/03/2022) for Or sooner if needed.    Laura Keeler, DNP, AGNP-c 07/03/2021  2:11 PM

## 2021-07-03 NOTE — Patient Instructions (Addendum)
It was a pleasure seeing you today. I hope your time spent with [place] was pleasant and helpful. Please let us know if there is anything we can do to improve the service you receive.   [facility] are doing great with your blood sugars!!  I will let you know what your labs show and if we need to make any changes I will let you.     Important Office Information Lab Results If labs were ordered, please note that you will see results through [facility] as soon as they come available from [facility].  It takes up to 5 business days for the results to be routed to me and for me to review them once all of the lab results have come through from [facility]. I will make recommendations based on your results and send these through [facility] [place] or someone from the office will call you to discuss. If your labs are abnormal, we may contact you to schedule a visit to discuss the results and make recommendations.  If you have not heard from [place] within 5 business days or you have waited longer than a week and your lab results have not come through on [facility], please feel free to call the office or send a message through [facility] to follow-up on these labs.   Referrals If referrals were placed today, the office where the referral was sent will contact you either by phone or through [facility] to set up scheduling. Please note that it can take up to a week for the referral office to contact you. If you do not hear from them in a week, please contact the referral office directly to inquire about scheduling.   Condition Treated If your condition worsens or you begin to have new symptoms, please schedule a follow-up appointment for further evaluation. If you are not sure if an appointment is needed, you may call the office to leave a message for the nurse and someone will contact you with recommendations.  If you have an urgent or life threatening emergency, please do not call the office, but seek emergency evaluation by calling 911 or going to the  nearest emergency room for evaluation.   MyChart and Phone Calls Please do not use MyChart for urgent messages. It may take up to 3 business days for MyChart messages to be read by staff and if they are unable to handle the request, an additional 3 business days for them to be routed to me and for my response.  Messages sent to the provider through [facility] do not come directly to the provider, please allow time for these messages to be routed and for me to respond.  We get a large volume of MyChart messages daily and these are responded to in the order received.   For urgent messages, please call the office at [phone] and speak with the front office staff or leave a message on the line of my assistant for guidance.  We are seeing patients from the hours of 8:00 am through 5:00 pm and calls directly to the nurse may not be answered immediately due to seeing patients, but your call will be returned as soon as possible.  Phone  messages received after 4:00 PM Monday through Thursday may not be returned until the following business day. Phone messages received after 11:00 AM on Friday may not be returned until Monday.   After Hours We share on call hours with providers from other offices. If you have an urgent need after hours that  cannot wait until the next business day, please contact the on call provider by calling the office number. A nurse will speak with you and contact the provider if needed for recommendations.  If you have an urgent or life threatening emergency after hours, please do not call the on call provider, but seek emergency evaluation by calling 911 or going to the nearest emergency room for evaluation.   Paperwork All paperwork requires a minimum of 5 days to complete and return to you or the designated personnel. Please keep this in mind when bringing in forms or sending requests for paperwork completion to the office.

## 2021-07-04 ENCOUNTER — Encounter (HOSPITAL_BASED_OUTPATIENT_CLINIC_OR_DEPARTMENT_OTHER): Payer: Self-pay | Admitting: Nurse Practitioner

## 2021-07-04 DIAGNOSIS — R7303 Prediabetes: Secondary | ICD-10-CM | POA: Insufficient documentation

## 2021-07-04 NOTE — Assessment & Plan Note (Signed)
Chronic.  Given current symptoms we will monitor labs today for evaluation.  No signs of bleeding present at this time.  We will make changes to plan of care based on lab findings.

## 2021-07-04 NOTE — Assessment & Plan Note (Signed)
Intermittent dizziness of unknown etiology.  Patient does have a history of vertigo.  Unclear at this time if this is related to vertigo or possible alternative diagnoses.  We will monitor labs today.  CGM placed on patient to monitor blood sugar levels closely to ensure that these are not dropping unexpectedly contributing to symptoms.  No alarm symptoms present at this time.  No neurological deficits present.

## 2021-07-04 NOTE — Assessment & Plan Note (Addendum)
Chronic.  Currently has as needed Ativan.  Based on patient's presentation and symptoms today I do suspect that there could be an element of anxiety contributing to the precordial chest pain that she endorses.  This does appear to be long-term however we will monitor labs today.  Patient encouraged to take as needed Ativan if she notices the symptoms of chest pain to see if this helps with improvement.  She is no longer taking daily Prozac.  May consider restart of this or alternative medication if she feels like her anxiety is not well controlled.  She will reach out with any new or worsening symptoms.

## 2021-07-04 NOTE — Assessment & Plan Note (Signed)
Chronic.  Currently on statin therapy.  Continue medication.  Will monitor labs today.

## 2021-07-04 NOTE — Assessment & Plan Note (Signed)
Chronic.  Diet controlled.  Symptoms consistent with low blood sugar.  We will place CGM on patient today to monitor blood sugar levels closely to ensure that she is not having any drops that could be contributing to the symptoms she is experiencing.  She has been instructed to monitor blood sugar closely and treat low blood sugars.  If symptoms worsen or fail to improve patient is aware to follow-up.

## 2021-07-04 NOTE — Assessment & Plan Note (Signed)
Chronic.  Patient reports that this has been present her entire adult life.  No alarm symptoms present at this time.HRRR today with no pedal edema.  Pulses intact.  Lungs clear to auscultation.  Unlikely that murmur is contributing to the symptoms she is experiencing but we can consider referral to cardiology for further evaluation if these continue.  She has seen cardiology in the past and has had complete work-up.  Patient will contact the office if she has new or worsening symptoms or seek emergency care

## 2021-07-04 NOTE — Assessment & Plan Note (Signed)
Chronic.  Currently on methimazole.  We will monitor labs today for evaluation given patient's current symptoms.  No changes in therapy at this time.  Patient instructed to contact the office or seek emergency evaluation if symptoms worsen or new symptoms develop.

## 2021-07-04 NOTE — Assessment & Plan Note (Signed)
Chronic.  Blood pressure stable at this time.  Recent episodes of dizziness and precordial chest pain reported.  Upon evaluation symptoms do not appear to be cardiac in nature.  EKG attempted today however unable to obtain accurate reading due to inability of the leads to maintain placement.  Several attempts made.  Patient will return for EKG.  We will obtain labs today.  No alarm symptoms present at this time. HRRR, neurologically intact, pulses normal.

## 2021-08-31 ENCOUNTER — Encounter: Payer: Self-pay | Admitting: Internal Medicine

## 2021-08-31 ENCOUNTER — Ambulatory Visit: Payer: Federal, State, Local not specified - PPO | Admitting: Internal Medicine

## 2021-08-31 VITALS — BP 124/80 | HR 74 | Ht 66.0 in | Wt 183.4 lb

## 2021-08-31 DIAGNOSIS — E0590 Thyrotoxicosis, unspecified without thyrotoxic crisis or storm: Secondary | ICD-10-CM

## 2021-08-31 DIAGNOSIS — E0500 Thyrotoxicosis with diffuse goiter without thyrotoxic crisis or storm: Secondary | ICD-10-CM | POA: Diagnosis not present

## 2021-08-31 LAB — COMPREHENSIVE METABOLIC PANEL
ALT: 13 U/L (ref 0–35)
AST: 17 U/L (ref 0–37)
Albumin: 4.2 g/dL (ref 3.5–5.2)
Alkaline Phosphatase: 113 U/L (ref 39–117)
BUN: 17 mg/dL (ref 6–23)
CO2: 28 mEq/L (ref 19–32)
Calcium: 9.7 mg/dL (ref 8.4–10.5)
Chloride: 102 mEq/L (ref 96–112)
Creatinine, Ser: 0.91 mg/dL (ref 0.40–1.20)
GFR: 67.02 mL/min (ref >60.00)
Glucose, Bld: 103 mg/dL — ABNORMAL HIGH (ref 70–99)
Potassium: 4.4 mEq/L (ref 3.5–5.1)
Sodium: 136 mEq/L (ref 135–145)
Total Bilirubin: 0.3 mg/dL (ref 0.2–1.2)
Total Protein: 7.8 g/dL (ref 6.0–8.3)

## 2021-08-31 LAB — T4, FREE: Free T4: 0.83 ng/dL (ref 0.60–1.60)

## 2021-08-31 LAB — TSH: TSH: 1.28 u[IU]/mL (ref 0.35–5.50)

## 2021-08-31 MED ORDER — METHIMAZOLE 5 MG PO TABS: 2.5000 mg | ORAL_TABLET | Freq: Every day | ORAL | 2 refills | Status: DC

## 2021-08-31 NOTE — Progress Notes (Signed)
Name: [name]  MRN/ DOB: [id number], December 30, 1955    Age/ Sex: 66 y.o., female     PCP: Orma Render, NP   Reason for Endocrinology Evaluation: Graves' Disease      Initial Endocrinology Clinic Visit: 02/16/2020    PATIENT IDENTIFIER: [name] is a 66 y.o., female with a past medical history of Hx of breast ca ( S/P right lumpectomy 2018 and radiation ) and Graves' disease . She has followed with [facility] since 02/16/2020 for consultative assistance with management of her hyperthyroidism.   HISTORICAL SUMMARY:   She has been diagnosed with Graves' disease in 2016. Had a thyroid uptake and scan on 08/13/2014 at 57.5 % of I-131 , uniform uptake consistent with Graves's disease. Has been on Methimazole since her diagnosis.    SUBJECTIVE:    Today (08/31/2021):  Ms. [name] is here for a follow up on Graves disease   Weight continues to fluctuate  An hour ago she felt so sleepy  ,but she got up earlier than usual today  Took laxative last night, had 3 BM's today and nausea  Denies palpitations recently , last time was > 2 months ago  Denies tremors  Has anxiety related to work    Denies local neck symptoms    HOME ENDOCRINE MEDICATION  Methimazole 5 mg daily , half a tablet daily        HISTORY:  Past Medical History:  Past Medical History:  Diagnosis Date   ALLERGIC RHINITIS 03/30/2008   Qualifier: Diagnosis of  By: Jenny Reichmann MD, Hunt Oris    Anxiety    Arthritis    neck   Breast cancer ([place]) 2018   Right Breast Cancer   Deep venous thrombosis ([place]) 01/26/2021   DVT (deep venous thrombosis) ([place])    04/2010   Dysarthria 09/22/2012   GERD (gastroesophageal reflux disease)    Graves' disease 09/03/2014   Headache(784.0) 09/22/2012   Headache(784.0) 09/22/2012   History of hiatal hernia    History of radiation therapy 02/05/2017   02/05/17-03/05/17,  right breast 40.05 Gy in 15 fractions, right breast boost 10 Gy in 5 fractions    Hyperlipidemia    Hypertension    Hyperthyroidism    Iron deficiency anemia    LEG PAIN, RIGHT 04/22/2008   Qualifier: Diagnosis of  By: Jenny Reichmann MD, Hunt Oris    Low back pain 01/26/2021   Malignant tumor of breast ([place]) 11/13/2016   Palpitation 09/24/2013   Palpitations    Personal history of radiation therapy 2018   Right Breast Cancer   Precordial chest pain 06/08/2020   Pulmonary emboli ([place]) 11/20/2016   Pulmonary embolism ([place]) 2010   Urine finding 01/26/2021   Uterine fibroid    Vitamin D deficiency    Graves disease   Past Surgical History:  Past Surgical History:  Procedure Laterality Date   BREAST LUMPECTOMY Right 12/16/2016   BREAST LUMPECTOMY WITH RADIOACTIVE SEED AND SENTINEL LYMPH NODE BIOPSY Right 12/16/2016   Procedure: RIGHT BREAST LUMPECTOMY WITH RADIOACTIVE SEED AND SENTINEL LYMPH NODE BIOPSY;  Surgeon: Rolm Bookbinder, MD;  Location: [facility];  Service: General;  Laterality: Right;   CHOLECYSTECTOMY     COLONOSCOPY     DILATION AND CURETTAGE OF UTERUS     01/2008   ESOPHAGOGASTRODUODENOSCOPY     ivc filter     Social History:  reports that she quit smoking about 19 years ago. Her smoking use included cigarettes. She has a 4.00  pack-year smoking history. She has never used smokeless tobacco. She reports current alcohol use. She reports that she does not use drugs. Family History:  Family History  Problem Relation Age of Onset   Drug abuse Brother    Hypertension Mother    Colon cancer Maternal Grandfather      HOME MEDICATIONS: Allergies as of 08/31/2021       Reactions   Amoxicillin-pot Clavulanate Other (See Comments)   Dizziness   Niacin-lovastatin Er Other (See Comments)   Caused flushing        Medication List        Accurate as of August 31, 2021  4:35 PM. If you have any questions, ask your nurse or doctor.          anastrozole 1 MG tablet Commonly known as: ARIMIDEX TAKE 1 TABLET BY MOUTH EVERY DAY   aspirin EC 81 MG tablet Take 1  tablet (81 mg total) by mouth daily.   atorvastatin 20 MG tablet Commonly known as: LIPITOR Take 20 mg by mouth daily.   candesartan 8 MG tablet Commonly known as: Atacand Take 1 tablet (8 mg total) by mouth daily.   hydrALAZINE 10 MG tablet Commonly known as: APRESOLINE Take 0.5 tablets (5 mg total) by mouth as needed (for diastolic greater than 371 for up to 2 doses.).   LORazepam 0.5 MG tablet Commonly known as: ATIVAN Take 0.5 mg by mouth 2 (two) times daily as needed.   methimazole 5 MG tablet Commonly known as: TAPAZOLE Take 0.5 tablets (2.5 mg total) by mouth daily.   OMEGA-3 FISH OIL PO Take 2,000 mg by mouth.   vitamin C 1000 MG tablet Take 1,000 mg by mouth daily.   VITAMIN D-3 PO Take 1 capsule by mouth daily.          OBJECTIVE:   PHYSICAL EXAM: VS: BP 124/80 (BP Location: Left Arm, Patient Position: Sitting, Cuff Size: Normal)   Pulse 74   Ht '5\' 6"'$  (1.676 m)   Wt 183 lb 6.4 oz (83.2 kg)   LMP 06/18/2010   SpO2 96%   BMI 29.60 kg/m    EXAM: General: Pt appears well and is in NAD  Eyes: External eye exam normal without stare, lid lag or exophthalmos.  EOM intact.    Neck: General: Supple without adenopathy. Thyroid: Thyroid size normal.  No goiter or nodules appreciated. No thyroid bruit.  Lungs: Clear with good BS bilat with no rales, rhonchi, or wheezes  Heart: Auscultation: RRR.  Abdomen: Normoactive bowel sounds, soft, nontender, without masses or organomegaly palpable     Mental Status: Judgment, insight: Intact Orientation: Oriented to time, place, and person Mood and affect: No depression, anxiety, or agitation     DATA REVIEWED:  Latest Reference Range & Units 08/31/21 12:58  Sodium 135 - 145 mEq/L 136  Potassium 3.5 - 5.1 mEq/L 4.4  Chloride 96 - 112 mEq/L 102  CO2 19 - 32 mEq/L 28  Glucose 70 - 99 mg/dL 103 (H)  BUN 6 - 23 mg/dL 17  Creatinine 0.40 - 1.20 mg/dL 0.91  Calcium 8.4 - 10.5 mg/dL 9.7  Alkaline Phosphatase 39 -  117 U/L 113  Albumin 3.5 - 5.2 g/dL 4.2  AST 0 - 37 U/L 17  ALT 0 - 35 U/L 13  Total Protein 6.0 - 8.3 g/dL 7.8  Total Bilirubin 0.2 - 1.2 mg/dL 0.3  GFR >60.00 mL/min 67.02    Latest Reference Range & Units 08/31/21 12:58  TSH  0.35 - 5.50 uIU/mL 1.28  T4,Free(Direct) 0.60 - 1.60 ng/dL 0.83      ASSESSMENT / PLAN / RECOMMENDATIONS:    Hyperthyroidism secondary to Graves' Disease:   - No local neck symptoms - Has been on methimazole since 2016, since she is on a small dose of methimazole we have opted to postpone radioactive iodine ablation -TFTs are normal   Medications : Continue Methimazole 5 mg, half a tablet  daily     2. Graves' Disease:     No extra-thyroidal manifestations of graves' disease      3. Elevated Alkaline phosphatase:   -Resolved   Follow-up in 6 months     Signed electronically by: Mack Guise, MD  [facility]  [facility] [address] [place],  [address] Phone: [phone] FAX: [phone]      CC: Orma Render, NP [address] [place] [address] Phone: [phone]  Fax: [phone]   Return to Endocrinology clinic as below: Future Appointments  Date Time Provider [place]  11/21/2021  9:40 AM [facility] DIAG TOMO 1 [facility] CE  11/27/2021 11:45 AM Benay Pike, MD [facility] None  03/05/2022 11:10 AM Avanti Jetter, Melanie Crazier, MD [facility] None

## 2021-09-01 LAB — T3: T3, Total: 86 ng/dL (ref 76–181)

## 2021-09-05 ENCOUNTER — Other Ambulatory Visit: Payer: Self-pay | Admitting: *Deleted

## 2021-09-05 DIAGNOSIS — I8393 Asymptomatic varicose veins of bilateral lower extremities: Secondary | ICD-10-CM

## 2021-09-24 ENCOUNTER — Ambulatory Visit (HOSPITAL_COMMUNITY)
Admission: RE | Admit: 2021-09-24 | Discharge: 2021-09-24 | Disposition: A | Discharge status: Home / Self Care | Payer: Federal, State, Local not specified - PPO | Source: Ambulatory Visit | Attending: Surgery | Admitting: Surgery

## 2021-09-24 DIAGNOSIS — I8393 Asymptomatic varicose veins of bilateral lower extremities: Secondary | ICD-10-CM | POA: Diagnosis present

## 2021-09-25 ENCOUNTER — Encounter: Payer: Federal, State, Local not specified - PPO | Admitting: Vascular Surgery

## 2021-10-03 NOTE — Progress Notes (Unsigned)
Cardiology Office Note   Date:  10/04/2021   ID:  [name], [name] Sep 24, 1955, MRN [id number]  PCP:  Laura Rosser, PA-C  Cardiologist:   Minus Breeding, MD    Chief Complaint  Patient presents with   Shortness of Breath   Nausea   Palpitations   Chest Pain    Discomfort.   Fatigue           History of Present Illness: [name] is a 66 y.o. female who presents for follow up of palpitations.  I saw her in 2019  She has had palpitations in the past and reports a treadmill test years ago in [place].   She has a history of DVT/PE and IVC filter several years ago.     She has complaints of chest discomfort.  This happens sporadically.  It is a deeper discomfort.  She has not necessarily bring it on with activity.  She is not describing any new associated nausea vomiting or diaphoresis.  It is left-sided.  There is no radiation to her jaw or to her arms.  There is no PND or orthopnea.  She does sleep poorly.  She has fatigue.  Her STOP BANG was 5.  She does snore.  She had balance issues.  She has had dizziness.  She feels her heart skipping when she is lying in bed.  This has been happening for about a month.  However, its not different than when she wore a monitor before.  She had some mild nausea.  She tries to walk every day for exercise.   Past Medical History:  Diagnosis Date   ALLERGIC RHINITIS 03/30/2008   Qualifier: Diagnosis of  By: Jenny Reichmann MD, Hunt Oris    Anxiety    Arthritis    neck   Breast cancer ([place]) 2018   Right Breast Cancer   Deep venous thrombosis ([place]) 01/26/2021   Dysarthria 09/22/2012   GERD (gastroesophageal reflux disease)    Graves' disease 09/03/2014   Headache(784.0) 09/22/2012   History of hiatal hernia    History of radiation therapy 02/05/2017   02/05/17-03/05/17,  right breast 40.05 Gy in 15 fractions, right breast boost 10 Gy in 5 fractions   Hyperlipidemia    Hypertension    Hyperthyroidism    Iron deficiency anemia    LEG  PAIN, RIGHT 04/22/2008   Qualifier: Diagnosis of  By: Jenny Reichmann MD, Hunt Oris    Low back pain 01/26/2021   Malignant tumor of breast ([place]) 11/13/2016   Palpitation 09/24/2013   Personal history of radiation therapy 2018   Right Breast Cancer   Pulmonary emboli ([place]) 11/20/2016   Uterine fibroid    Vitamin D deficiency    Graves disease    Past Surgical History:  Procedure Laterality Date   BREAST LUMPECTOMY Right 12/16/2016   BREAST LUMPECTOMY WITH RADIOACTIVE SEED AND SENTINEL LYMPH NODE BIOPSY Right 12/16/2016   Procedure: RIGHT BREAST LUMPECTOMY WITH RADIOACTIVE SEED AND SENTINEL LYMPH NODE BIOPSY;  Surgeon: Rolm Bookbinder, MD;  Location: [facility];  Service: General;  Laterality: Right;   CHOLECYSTECTOMY     COLONOSCOPY     DILATION AND CURETTAGE OF UTERUS     01/2008   ESOPHAGOGASTRODUODENOSCOPY     ivc filter       Current Outpatient Medications  Medication Sig Dispense Refill   anastrozole (ARIMIDEX) 1 MG tablet TAKE 1 TABLET BY MOUTH EVERY DAY 90 tablet 4   Ascorbic Acid (VITAMIN C) 1000 MG tablet Take  1,000 mg by mouth daily.     aspirin EC 81 MG tablet Take 1 tablet (81 mg total) by mouth daily. 90 tablet 3   atorvastatin (LIPITOR) 20 MG tablet Take 20 mg by mouth daily.     candesartan (ATACAND) 8 MG tablet Take 1 tablet (8 mg total) by mouth daily.     Cholecalciferol (VITAMIN D-3 PO) Take 1 capsule by mouth daily.     hydrALAZINE (APRESOLINE) 10 MG tablet Take 0.5 tablets (5 mg total) by mouth as needed (for diastolic greater than 539 for up to 2 doses.). 30 tablet 3   LORazepam (ATIVAN) 0.5 MG tablet Take 0.5 mg by mouth 2 (two) times daily as needed.     methimazole (TAPAZOLE) 5 MG tablet Take 0.5 tablets (2.5 mg total) by mouth daily. 45 tablet 2   Omega-3 Fatty Acids (OMEGA-3 FISH OIL PO) Take 2,000 mg by mouth.     propranolol (INDERAL) 10 MG tablet Take 1 tablet (10 mg total) by mouth at bedtime as needed. 30 tablet 1   No current facility-administered medications  for this visit.    Allergies:   Amoxicillin-pot clavulanate and Niacin-lovastatin er    ROS:  Please see the history of present illness.   Otherwise, review of systems are positive for none.   All other systems are reviewed and negative.    PHYSICAL EXAM: VS:  BP 132/82 (BP Location: Left Arm, Patient Position: Sitting, Cuff Size: Normal)   Pulse 75   Ht '5\' 6"'$  (1.676 m)   Wt 182 lb (82.6 kg)   LMP 06/18/2010   BMI 29.38 kg/m  , BMI Body mass index is 29.38 kg/m.  GENERAL:  Well appearing NECK:  No jugular venous distention, waveform within normal limits, carotid upstroke brisk and symmetric, possible soft right bruits, no thyromegaly LUNGS:  Clear to auscultation bilaterally CHEST:  Unremarkable HEART:  PMI not displaced or sustained,S1 and S2 within normal limits, no S3, no S4, no clicks, no rubs, soft brief apical and left upper sternal border early peaking systolic murmur, no diastolic murmurs ABD:  Flat, positive bowel sounds normal in frequency in pitch, no bruits, no rebound, no guarding, no midline pulsatile mass, no hepatomegaly, no splenomegaly EXT:  2 plus pulses throughout, no edema, no cyanosis no clubbing  EKG:  EKG is  ordered today. Sinus rhythm, rate of 65, axis within normal limits, intervals within normal limits, nonspecific anterior T wave inversions.  I did compare this to previous EKGs and it was unchanged.  Recent Labs: 05/30/2021: Hemoglobin 12.3; Platelet Count 139 08/31/2021: ALT 13; BUN 17; Creatinine, Ser 0.91; Potassium 4.4; Sodium 136; TSH 1.28    Lipid Panel    Component Value Date/Time   CHOL 153 01/26/2021 1025   TRIG 103 01/26/2021 1025   HDL 49 01/26/2021 1025   CHOLHDL 3.1 01/26/2021 1025   CHOLHDL 4.4 04/06/2010 1037   VLDL 16 04/06/2010 1037   LDLCALC 85 01/26/2021 1025      Wt Readings from Last 3 Encounters:  10/04/21 182 lb (82.6 kg)  08/31/21 183 lb 6.4 oz (83.2 kg)  07/03/21 177 lb (80.3 kg)      Other studies  Reviewed: Additional studies/ records that were reviewed today include:    Labs Review of the above records demonstrates:    See elsewhere   ASSESSMENT AND PLAN:  PALPITATIONS:    I would give her propranolol 10 mg nightly as needed.  She has had otherwise normal blood work to  include thyroid this year.  Her electrolytes have been normal.  There is nothing on a monitor last year.  HTN:   Her blood pressure is at target.  No change in therapy.  BRUIT: I will check carotid Dopplers  FATIGUE: She will get home sleep study  CHEST PAIN: I will screen her with a POET (Plain Old Exercise Treadmill).    Current medicines are reviewed at length with the patient today.  The patient does not have concerns regarding medicines.  The following changes have been made:   As above  Labs/ tests ordered today include:     Orders Placed This Encounter  Procedures   EXERCISE TOLERANCE TEST (ETT)   EKG 12-Lead   [facility] Sleep Study   VAS US CAROTID    Disposition:   FU with me in one year  Signed, Minus Breeding, MD  10/04/2021 11:32 AM    [place]

## 2021-10-04 ENCOUNTER — Encounter: Payer: Self-pay | Admitting: Cardiology

## 2021-10-04 ENCOUNTER — Ambulatory Visit: Payer: Federal, State, Local not specified - PPO | Admitting: Cardiology

## 2021-10-04 VITALS — BP 132/82 | HR 75 | Ht 66.0 in | Wt 182.0 lb

## 2021-10-04 DIAGNOSIS — R002 Palpitations: Secondary | ICD-10-CM

## 2021-10-04 DIAGNOSIS — I10 Essential (primary) hypertension: Secondary | ICD-10-CM

## 2021-10-04 DIAGNOSIS — R079 Chest pain, unspecified: Secondary | ICD-10-CM | POA: Diagnosis not present

## 2021-10-04 DIAGNOSIS — R0989 Other specified symptoms and signs involving the circulatory and respiratory systems: Secondary | ICD-10-CM

## 2021-10-04 MED ORDER — PROPRANOLOL HCL 10 MG PO TABS: 10.0000 mg | ORAL_TABLET | Freq: Every evening | ORAL | 1 refills | Status: DC | PRN

## 2021-10-04 NOTE — Patient Instructions (Addendum)
Medication Instructions:  START Propranolol 10 mg at bedtime as needed   *If you need a refill on your cardiac medications before your next appointment, please call your pharmacy*  Lab Work: NONE ordered at this time of appointment   If you have labs (blood work) drawn today and your tests are completely normal, you will receive your results only by: [place] (if you have MyChart) OR A paper copy in the mail If you have any lab test that is abnormal or we need to change your treatment, we will call you to review the results.  Testing/Procedures: Your physician has recommended that you have a sleep study. This test records several body functions during sleep, including: brain activity, eye movement, oxygen and carbon dioxide blood levels, heart rate and rhythm, breathing rate and rhythm, the flow of air through your mouth and nose, snoring, body muscle movements, and chest and belly movement.  Your physician has requested that you have a carotid duplex. This test is an ultrasound of the carotid arteries in your neck. It looks at blood flow through these arteries that supply the brain with blood. Allow one hour for this exam. There are no restrictions or special instructions.  Your physician has requested that you have an exercise tolerance test. For further information please visit [email or web address]. Please also follow instruction sheet, as given.   Follow-Up: At [facility] you and your health needs are our priority.  As part of our continuing mission to provide you with exceptional heart care, we have created designated Provider Care Teams.  These Care Teams include your primary Cardiologist (physician) and Advanced Practice Providers (APPs -  Physician Assistants and Nurse Practitioners) who all work together to provide you with the care you need, when you need it.  Your next appointment:   1 year(s)  The format for your next appointment:   In Person  Provider:   Minus Breeding, MD    Other Instructions   Important Information About [place]

## 2021-10-05 ENCOUNTER — Telehealth (HOSPITAL_COMMUNITY): Payer: Self-pay | Admitting: *Deleted

## 2021-10-05 NOTE — Telephone Encounter (Signed)
Close encounter 

## 2021-10-09 ENCOUNTER — Ambulatory Visit (HOSPITAL_BASED_OUTPATIENT_CLINIC_OR_DEPARTMENT_OTHER)
Admission: RE | Admit: 2021-10-09 | Discharge: 2021-10-09 | Disposition: A | Discharge status: Home / Self Care | Payer: Federal, State, Local not specified - PPO | Source: Ambulatory Visit | Attending: Cardiology | Admitting: Cardiology

## 2021-10-09 ENCOUNTER — Ambulatory Visit (HOSPITAL_COMMUNITY)
Admission: RE | Admit: 2021-10-09 | Discharge: 2021-10-09 | Disposition: A | Discharge status: Home / Self Care | Payer: Federal, State, Local not specified - PPO | Source: Ambulatory Visit | Attending: Cardiovascular Disease | Admitting: Cardiovascular Disease

## 2021-10-09 DIAGNOSIS — R079 Chest pain, unspecified: Secondary | ICD-10-CM | POA: Diagnosis present

## 2021-10-09 DIAGNOSIS — R0989 Other specified symptoms and signs involving the circulatory and respiratory systems: Secondary | ICD-10-CM | POA: Insufficient documentation

## 2021-10-09 LAB — EXERCISE TOLERANCE TEST
Angina Index: 0
Duke Treadmill Score: 7
Estimated workload: 9.2
Exercise duration (min): 7 min
Exercise duration (sec): 27 s
MPHR: 157 {beats}/min
Peak HR: 139 {beats}/min
Percent HR: 88 %
Rest HR: 67 {beats}/min
ST Depression (mm): 0 mm

## 2021-10-10 ENCOUNTER — Encounter: Payer: Self-pay | Admitting: Vascular Surgery

## 2021-10-10 ENCOUNTER — Ambulatory Visit (INDEPENDENT_AMBULATORY_CARE_PROVIDER_SITE_OTHER): Payer: Federal, State, Local not specified - PPO | Admitting: Vascular Surgery

## 2021-10-10 VITALS — BP 109/77 | HR 73 | Temp 97.9°F | Resp 18 | Ht 66.6 in | Wt 183.8 lb

## 2021-10-10 DIAGNOSIS — I872 Venous insufficiency (chronic) (peripheral): Secondary | ICD-10-CM

## 2021-10-10 NOTE — Progress Notes (Signed)
ASSESSMENT & PLAN   CHRONIC VENOUS DISEASE: This patient has CEAP C1 venous disease (telangiectasias).  She has no deep venous reflux on the left.  She has some mild superficial venous reflux only.  There is no DVT on the left.  She does have chronic clot in the right popliteal vein based on her duplex back in March of this year.  We have discussed the importance of intermittent leg elevation and the proper positioning for this.  We are having her fitted for some knee-high compression stockings with a gradient of 15 to 20 mmHg.  I have encouraged her to exercise.  Specifically we discussed walking and water aerobics.  I have encouraged her to avoid prolonged sitting and standing.  If her venous disease or symptoms progress in the future then certainly I be happy to reevaluate her.  Currently however she is not a candidate for laser ablation of the left great saphenous vein as she has minimal reflux in the vein is not dilated.  REASON FOR CONSULT:    Spider veins.  The patient is self-referred.  HPI:   [name] is a 66 y.o. female who comes in for evaluation of some spider veins in both lower extremities.  The patient does describe some aching pain and heaviness in her legs which is aggravated by sitting and standing and relieved with elevation.  She does occasionally wear compression stockings.  She did have a DVT in the right leg in 2010 and also a pulmonary embolus.  She was on anticoagulation at that time but is no longer on anticoagulation.  She was on Lovenox for 2-1/2 years.  She was treated for breast cancer in 2018.   Past Medical History:  Diagnosis Date   ALLERGIC RHINITIS 03/30/2008   Qualifier: Diagnosis of  By: Jenny Reichmann MD, Hunt Oris    Anxiety    Arthritis    neck   Breast cancer ([place]) 2018   Right Breast Cancer   Deep venous thrombosis ([place]) 01/26/2021   Dysarthria 09/22/2012   GERD (gastroesophageal reflux disease)    Graves' disease 09/03/2014   Headache(784.0)  09/22/2012   History of hiatal hernia    History of radiation therapy 02/05/2017   02/05/17-03/05/17,  right breast 40.05 Gy in 15 fractions, right breast boost 10 Gy in 5 fractions   Hyperlipidemia    Hypertension    Hyperthyroidism    Iron deficiency anemia    LEG PAIN, RIGHT 04/22/2008   Qualifier: Diagnosis of  By: Jenny Reichmann MD, Hunt Oris    Low back pain 01/26/2021   Malignant tumor of breast ([place]) 11/13/2016   Palpitation 09/24/2013   Personal history of radiation therapy 2018   Right Breast Cancer   Pulmonary emboli ([place]) 11/20/2016   Uterine fibroid    Vitamin D deficiency    Graves disease    Family History  Problem Relation Age of Onset   Drug abuse Brother    Hypertension Mother    Colon cancer Maternal Grandfather     SOCIAL HISTORY: Social History   Tobacco Use   Smoking status: Former    Packs/day: 0.40    Years: 10.00    Total pack years: 4.00    Types: Cigarettes    Quit date: 06/01/2002    Years since quitting: 19.3   Smokeless tobacco: Never  Substance Use Topics   Alcohol use: Yes    Comment: occasional 1 drink per month    Allergies  Allergen Reactions   Amoxicillin-Pot  Clavulanate Other (See Comments)    Dizziness    Niacin-Lovastatin Er Other (See Comments)    Caused flushing     Current Outpatient Medications  Medication Sig Dispense Refill   anastrozole (ARIMIDEX) 1 MG tablet TAKE 1 TABLET BY MOUTH EVERY DAY 90 tablet 4   Ascorbic Acid (VITAMIN C) 1000 MG tablet Take 1,000 mg by mouth daily.     aspirin EC 81 MG tablet Take 1 tablet (81 mg total) by mouth daily. 90 tablet 3   atorvastatin (LIPITOR) 20 MG tablet Take 20 mg by mouth daily.     candesartan (ATACAND) 8 MG tablet Take 1 tablet (8 mg total) by mouth daily.     Cholecalciferol (VITAMIN D-3 PO) Take 1 capsule by mouth daily.     hydrALAZINE (APRESOLINE) 10 MG tablet Take 0.5 tablets (5 mg total) by mouth as needed (for diastolic greater than 330 for up to 2 doses.). 30 tablet 3    LORazepam (ATIVAN) 0.5 MG tablet Take 0.5 mg by mouth 2 (two) times daily as needed.     methimazole (TAPAZOLE) 5 MG tablet Take 0.5 tablets (2.5 mg total) by mouth daily. 45 tablet 2   Omega-3 Fatty Acids (OMEGA-3 FISH OIL PO) Take 2,000 mg by mouth.     propranolol (INDERAL) 10 MG tablet Take 1 tablet (10 mg total) by mouth at bedtime as needed. 30 tablet 1   No current facility-administered medications for this visit.    REVIEW OF SYSTEMS:  '[X]'$  denotes positive finding, '[ ]'$  denotes negative finding Cardiac  Comments:  Chest pain or chest pressure:    Shortness of breath upon exertion:    Short of breath when lying flat:    Irregular heart rhythm: x       Vascular    Pain in calf, thigh, or hip brought on by ambulation:    Pain in feet at night that wakes you up from your sleep:     Blood clot in your veins: x   Leg swelling:  x       Pulmonary    Oxygen at home:    Productive cough:     Wheezing:         Neurologic    Sudden weakness in arms or legs:     Sudden numbness in arms or legs:     Sudden onset of difficulty speaking or slurred speech:    Temporary loss of vision in one eye:     Problems with dizziness:         Gastrointestinal    Blood in stool:     Vomited blood:         Genitourinary    Burning when urinating:     Blood in urine:        Psychiatric    Major depression:         Hematologic    Bleeding problems:    Problems with blood clotting too easily:        Skin    Rashes or ulcers:        Constitutional    Fever or chills:    -  PHYSICAL EXAM:   Vitals:   10/10/21 1415  BP: 109/77  Pulse: 73  Resp: 18  Temp: 97.9 F (36.6 C)  TempSrc: Temporal  SpO2: 99%  Weight: 183 lb 12.8 oz (83.4 kg)  Height: 5' 6.6" (1.692 m)   Body mass index is 29.13 kg/m. GENERAL: The patient is a well-nourished  female, in no acute distress. The vital signs are documented above. CARDIAC: There is a regular rate and rhythm.  VASCULAR: I do not detect  carotid bruits. She has palpable pedal pulses. She has some small telangiectasias in her thighs bilaterally.       PULMONARY: There is good air exchange bilaterally without wheezing or rales. ABDOMEN: Soft and non-tender with normal pitched bowel sounds.  MUSCULOSKELETAL: There are no major deformities. NEUROLOGIC: No focal weakness or paresthesias are detected. SKIN: There are no ulcers or rashes noted. PSYCHIATRIC: The patient has a normal affect.  DATA:    VENOUS DUPLEX (DVT STUDY): I did review the DVT study that was done of both lower extremities on 05/02/2021.  On the left lower extremity there was no evidence of DVT.  On the right side there was no acute DVT.  There was some chronic thrombus in the right popliteal vein.  VENOUS DUPLEX (REFLUX STUDY): I have reviewed the venous reflux study that was done of the left lower extremity on 09/24/2021.  There is no evidence of DVT in the left leg.  There was no deep venous reflux.  There was minimal superficial venous reflux in the distal thigh and knee in the great saphenous vein but the vein was not dilated.  Diameters range from 2.6-3.7 mm.  Deitra [facility]

## 2021-10-11 ENCOUNTER — Telehealth: Payer: Self-pay

## 2021-10-11 ENCOUNTER — Telehealth: Payer: Self-pay | Admitting: *Deleted

## 2021-10-11 NOTE — Telephone Encounter (Signed)
Per Doy Mince with [employer] on today no PA is required for [facility]. CPT 95800. Stacy notified ok to activate.

## 2021-10-11 NOTE — Telephone Encounter (Signed)
Called and made the patient aware that She may proceed with the [facility] Sleep Study. PIN # provided to the patient. Patient made aware that She will be contacted after the test has been read with the results and any recommendations. Patient verbalized understanding and thanked me for the call.

## 2021-10-16 ENCOUNTER — Encounter: Payer: Self-pay | Admitting: *Deleted

## 2021-11-07 DIAGNOSIS — K080 Exfoliation of teeth due to systemic causes: Secondary | ICD-10-CM | POA: Diagnosis not present

## 2021-11-13 ENCOUNTER — Telehealth: Payer: Self-pay

## 2021-11-13 NOTE — Telephone Encounter (Signed)
I made a follow up call to the patient about the [place] watch. She states that she was out of town and had forgotten the Pin # to the watch. I gave her the pin # again and she said she had been meaning to give [place] a call but she will wear the watch this weekend. She said she appreciate me for giving her a follow up call.

## 2021-11-15 DIAGNOSIS — H04129 Dry eye syndrome of unspecified lacrimal gland: Secondary | ICD-10-CM | POA: Diagnosis not present

## 2021-11-15 DIAGNOSIS — H2513 Age-related nuclear cataract, bilateral: Secondary | ICD-10-CM | POA: Diagnosis not present

## 2021-11-21 ENCOUNTER — Ambulatory Visit
Admission: RE | Admit: 2021-11-21 | Discharge: 2021-11-21 | Disposition: A | Discharge status: Home / Self Care | Payer: Federal, State, Local not specified - PPO | Source: Ambulatory Visit | Attending: Hematology and Oncology | Admitting: Hematology and Oncology

## 2021-11-21 DIAGNOSIS — Z853 Personal history of malignant neoplasm of breast: Secondary | ICD-10-CM | POA: Diagnosis not present

## 2021-11-21 DIAGNOSIS — Z170 Estrogen receptor positive status [ER+]: Secondary | ICD-10-CM

## 2021-11-21 DIAGNOSIS — R928 Other abnormal and inconclusive findings on diagnostic imaging of breast: Secondary | ICD-10-CM | POA: Diagnosis not present

## 2021-11-27 ENCOUNTER — Encounter: Payer: Self-pay | Admitting: Hematology and Oncology

## 2021-11-27 ENCOUNTER — Inpatient Hospital Stay
Payer: Federal, State, Local not specified - PPO | Attending: Hematology and Oncology | Admitting: Hematology and Oncology

## 2021-11-27 ENCOUNTER — Other Ambulatory Visit: Payer: Self-pay

## 2021-11-27 VITALS — BP 134/84 | HR 73 | Temp 97.9°F | Resp 16 | Ht 66.0 in | Wt 185.9 lb

## 2021-11-27 DIAGNOSIS — Z170 Estrogen receptor positive status [ER+]: Secondary | ICD-10-CM | POA: Insufficient documentation

## 2021-11-27 DIAGNOSIS — Z79811 Long term (current) use of aromatase inhibitors: Secondary | ICD-10-CM | POA: Diagnosis not present

## 2021-11-27 DIAGNOSIS — Z86711 Personal history of pulmonary embolism: Secondary | ICD-10-CM | POA: Diagnosis not present

## 2021-11-27 DIAGNOSIS — Z87891 Personal history of nicotine dependence: Secondary | ICD-10-CM | POA: Insufficient documentation

## 2021-11-27 DIAGNOSIS — Z86718 Personal history of other venous thrombosis and embolism: Secondary | ICD-10-CM | POA: Insufficient documentation

## 2021-11-27 DIAGNOSIS — C50511 Malignant neoplasm of lower-outer quadrant of right female breast: Secondary | ICD-10-CM | POA: Diagnosis not present

## 2021-11-27 DIAGNOSIS — Z923 Personal history of irradiation: Secondary | ICD-10-CM | POA: Diagnosis not present

## 2021-11-27 DIAGNOSIS — M8580 Other specified disorders of bone density and structure, unspecified site: Secondary | ICD-10-CM | POA: Diagnosis not present

## 2021-11-27 DIAGNOSIS — Z8616 Personal history of COVID-19: Secondary | ICD-10-CM | POA: Insufficient documentation

## 2021-11-27 NOTE — Progress Notes (Signed)
[place]  [phone] [phone]     ID: [name] DOB: [birth year]  MR#: [id number]  JSE#:[id number]  Patient Care Team: Shanon Rosser, PA-C as PCP - General (Physician Assistant) Minus Breeding, MD as PCP - Cardiology (Cardiology) Rolm Bookbinder, MD as Consulting Physician (General Surgery) Magrinat, Virgie Dad, MD (Inactive) as Consulting Physician (Oncology) Gery Pray, MD as Consulting Physician (Radiation Oncology) Bobbye Charleston, MD as Consulting Physician (Obstetrics and Gynecology)   CHIEF COMPLAINT: Estrogen receptor positive breast cancer  CURRENT TREATMENT: anastrozole   INTERVAL HISTORY: [name] returns today for follow-up of her estrogen receptor positive breast cancer.  She continues on anastrozole.  She has been tolerating this very well.  No hot flashes/vaginal dryness or arthralgias. Last mammogram in October 2023 with no mammographic evidence of malignancy.   Most recent bone density showed osteopenia.  T score of -1.3.  She is now continuing on calcium/vitamin D but has not been able to go back to exercise again.  She has gained about 5 pounds of weight and she thinks this is likely because she stopped exercising.  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  She denies any breast changes otherwise. Rest of the pertinent 10 point ROS reviewed and negative    HISTORY OF CURRENT ILLNESS: From the original intake note:  [name] had routine bilateral screening mammography with tomography at the [facility] 10/08/2016. An area of possible distortion in the right breast was noted. She was recalled for right diagnostic mammography with tomography on 11/08/2016. This found the breast density to be category C. The small area of architectural distortion in the lateral right breast persisted and on 11/11/2016 biopsy of this area showed ([id number]) invasive ductal  carcinoma, E-cadherin positive, estrogen receptor 95% positive with strong staining intensity, progesterone receptor 5% positive, with strong staining intensity, with an MIB-1 of 10%, and no HER-2 amplification, with a signals ratio of 1.51 and number per cell 3.40. Right axillary ultrasound 11/13/2016 was sonographically benign.  Of note, she has a history of DVT diagnosed March 2010, felt to be possibly related to estrogen replacement therapy. She was coumadinized but developed a pulmonary embolus while on Coumadin. She was then had an IVC filter placed and was anticoagulated with Lovenox for 2 years. An extensive hypercoagulable workup was negative except for an elevated factor VIII level. Her IVC filter is still in place and the patient tells me she is participating in a lawsuit regarding that.   The patient's subsequent history is as detailed below.   PAST MEDICAL HISTORY: Past Medical History:  Diagnosis Date   ALLERGIC RHINITIS 03/30/2008   Qualifier: Diagnosis of  By: Jenny Reichmann MD, Hunt Oris    Anxiety    Arthritis    neck   Breast cancer ([place]) 2018   Right Breast Cancer   Deep venous thrombosis ([place]) 01/26/2021   Dysarthria 09/22/2012   GERD (gastroesophageal reflux disease)    Graves' disease 09/03/2014   Headache(784.0) 09/22/2012   History of hiatal hernia    History of radiation therapy 02/05/2017   02/05/17-03/05/17,  right breast 40.05 Gy in 15 fractions, right breast boost 10 Gy in 5 fractions   Hyperlipidemia    Hypertension    Hyperthyroidism    Iron deficiency anemia    LEG PAIN, RIGHT 04/22/2008   Qualifier: Diagnosis of  By: Jenny Reichmann MD, Hunt Oris    Low back pain 01/26/2021  Malignant tumor of breast ([place]) 11/13/2016   Palpitation 09/24/2013   Personal history of radiation therapy 2018   Right Breast Cancer   Pulmonary emboli ([place]) 11/20/2016   Uterine fibroid    Vitamin D deficiency    Graves disease    PAST SURGICAL HISTORY: Past Surgical History:  Procedure  Laterality Date   BREAST LUMPECTOMY Right 12/16/2016   BREAST LUMPECTOMY WITH RADIOACTIVE SEED AND SENTINEL LYMPH NODE BIOPSY Right 12/16/2016   Procedure: RIGHT BREAST LUMPECTOMY WITH RADIOACTIVE SEED AND SENTINEL LYMPH NODE BIOPSY;  Surgeon: Rolm Bookbinder, MD;  Location: [facility];  Service: General;  Laterality: Right;   CHOLECYSTECTOMY     COLONOSCOPY     DILATION AND CURETTAGE OF UTERUS     01/2008   ESOPHAGOGASTRODUODENOSCOPY     ivc filter      FAMILY HISTORY Family History  Problem Relation Age of Onset   Drug abuse Brother    Hypertension Mother    Colon cancer Maternal Grandfather   She notes that her father died at age 27 from an accidental head injury.The patient's mother is 67 years old as of October 2018. Pt has one brother and no sisters. Pt reports that a second cousin has a hx of breast cancer and was dx in her 20's. Pt denies family hx of ovarian cancer.   GYNECOLOGIC HISTORY:  Patient's last menstrual period was 06/18/2010. Menarche: 66 years old Age at first live birth: No children GP: GXP0 LMP: March 2016 Contraceptive: OCP on and off for many years d/c after DVT and PE diagnosis.  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  She lives by herself.  She keeps no pets   ADVANCED DIRECTIVES: Not in place. At the 11/20/2016 visit the patient was given the appropriate documents to complete and notarize at her discretion   HEALTH MAINTENANCE: Social History   Tobacco Use   Smoking status: Former    Packs/day: 0.40    Years: 10.00    Total pack years: 4.00    Types: Cigarettes    Quit date: 06/01/2002    Years since quitting: 19.5   Smokeless tobacco: Never  Vaping Use   Vaping Use: Never used  Substance Use Topics   Alcohol use: Yes    Comment: occasional 1 drink per month   Drug use: No     Colonoscopy: 2013  PAP: 08/30/2016   Bone density: 10/04/2015 with  T-score of -0.3 at femur neck right   Allergies  Allergen Reactions   Amoxicillin-Pot Clavulanate Other (See Comments)    Dizziness    Niacin-Lovastatin Er Other (See Comments)    Caused flushing     Current Outpatient Medications  Medication Sig Dispense Refill   anastrozole (ARIMIDEX) 1 MG tablet TAKE 1 TABLET BY MOUTH EVERY DAY 90 tablet 4   Ascorbic Acid (VITAMIN C) 1000 MG tablet Take 1,000 mg by mouth daily.     aspirin EC 81 MG tablet Take 1 tablet (81 mg total) by mouth daily. 90 tablet 3   atorvastatin (LIPITOR) 20 MG tablet Take 20 mg by mouth daily.     candesartan (ATACAND) 8 MG tablet Take 1 tablet (8 mg total) by mouth daily.     Cholecalciferol (VITAMIN D-3 PO) Take 1 capsule by mouth daily.     hydrALAZINE (APRESOLINE) 10 MG tablet Take 0.5 tablets (5 mg total) by mouth as needed (for diastolic  greater than 110 for up to 2 doses.). 30 tablet 3   LORazepam (ATIVAN) 0.5 MG tablet Take 0.5 mg by mouth 2 (two) times daily as needed.     methimazole (TAPAZOLE) 5 MG tablet Take 0.5 tablets (2.5 mg total) by mouth daily. 45 tablet 2   Omega-3 Fatty Acids (OMEGA-3 FISH OIL PO) Take 2,000 mg by mouth.     propranolol (INDERAL) 10 MG tablet Take 1 tablet (10 mg total) by mouth at bedtime as needed. 30 tablet 1   No current facility-administered medications for this visit.    OBJECTIVE:  African-American woman in no acute distress  Vitals:   11/27/21 1155  BP: 134/84  Pulse: 73  Resp: 16  Temp: 97.9 F (36.6 C)  SpO2: 100%      Body mass index is 30.01 kg/m.   Wt Readings from Last 3 Encounters:  11/27/21 185 lb 14.4 oz (84.3 kg)  10/10/21 183 lb 12.8 oz (83.4 kg)  10/04/21 182 lb (82.6 kg)     ECOG FS:1 - Symptomatic but completely ambulatory  Physical Exam Constitutional:      Appearance: Normal appearance.  Chest:     Comments: Bilateral breasts inspected.  No palpable masses or regional adenopathy Musculoskeletal:        General: No swelling.      Cervical back: Normal range of motion and neck supple. No rigidity.  Lymphadenopathy:     Cervical: No cervical adenopathy.  Neurological:     Mental Status: She is alert.       LAB RESULTS:  CMP     Component Value Date/Time   NA 136 08/31/2021 1258   NA 142 01/26/2021 1025   NA 141 11/20/2016 0822   K 4.4 08/31/2021 1258   K 3.6 11/20/2016 0822   CL 102 08/31/2021 1258   CL 105 06/24/2012 1548   CO2 28 08/31/2021 1258   CO2 24 11/20/2016 0822   GLUCOSE 103 (H) 08/31/2021 1258   GLUCOSE 102 11/20/2016 0822   GLUCOSE 91 06/24/2012 1548   BUN 17 08/31/2021 1258   BUN 12 01/26/2021 1025   BUN 19.2 11/20/2016 0822   CREATININE 0.91 08/31/2021 1258   CREATININE 0.88 05/30/2021 1125   CREATININE 0.9 11/20/2016 0822   CALCIUM 9.7 08/31/2021 1258   CALCIUM 9.5 11/20/2016 0822   PROT 7.8 08/31/2021 1258   PROT 7.8 01/26/2021 1025   PROT 8.2 11/20/2016 0822   ALBUMIN 4.2 08/31/2021 1258   ALBUMIN 4.3 01/26/2021 1025   ALBUMIN 3.8 11/20/2016 0822   AST 17 08/31/2021 1258   AST 14 (L) 05/30/2021 1125   AST 15 11/20/2016 0822   ALT 13 08/31/2021 1258   ALT 14 05/30/2021 1125   ALT 15 11/20/2016 0822   ALKPHOS 113 08/31/2021 1258   ALKPHOS 122 11/20/2016 0822   BILITOT 0.3 08/31/2021 1258   BILITOT 0.5 05/30/2021 1125   BILITOT 0.52 11/20/2016 0822   GFRNONAA >60 05/30/2021 1125   GFRAA >60 03/18/2019 1631    No results found for: "TOTALPROTELP", "ALBUMINELP", "A1GS", "A2GS", "BETS", "BETA2SER", "GAMS", "MSPIKE", "SPEI"  No results found for: "KPAFRELGTCHN", "LAMBDASER", "KAPLAMBRATIO"  Lab Results  Component Value Date   WBC 4.2 05/30/2021   NEUTROABS 1.9 05/30/2021   HGB 12.3 05/30/2021   HCT 37.7 05/30/2021   MCV 96.2 05/30/2021   PLT 139 (L) 05/30/2021    Lab Results  Component Value Date   LABCA2 17 04/26/2008    No components found for: "JOITGP498"  No  results for input(s): "INR" in the last 168 hours.  Lab Results  Component Value Date    LABCA2 17 04/26/2008    No results found for: "PIR518"  No results found for: "CAN125"  No results found for: "CAN153"  No results found for: "CA2729"  No components found for: "HGQUANT"  No results found for: "CEA1", "CEA" / No results found for: "CEA1", "CEA"   No results found for: "AFPTUMOR"  No results found for: "CHROMOGRNA"  No results found for: "HGBA", "HGBA2QUANT", "HGBFQUANT", "HGBSQUAN" (Hemoglobinopathy evaluation)   No results found for: "LDH"  Lab Results  Component Value Date   IRON 61 12/25/2012   TIBC 331 12/25/2012   IRONPCTSAT 18 (L) 12/25/2012   (Iron and TIBC)  Lab Results  Component Value Date   FERRITIN 31 12/25/2012    Urinalysis    Component Value Date/Time   COLORURINE LT YELLOW 04/18/2008 1220   APPEARANCEUR Clear 04/18/2008 1220   LABSPEC > OR = 1.030 04/18/2008 1220   PHURINE 5.5 04/18/2008 1220   GLUCOSEU NEGATIVE 04/18/2008 1220   BILIRUBINUR NEGATIVE 04/18/2008 1220   KETONESUR NEGATIVE 04/18/2008 1220   UROBILINOGEN 0.2 mg/dL 04/18/2008 1220   NITRITE Negative 04/18/2008 1220   LEUKOCYTESUR Negative 04/18/2008 1220    STUDIES: MM DIAG BREAST TOMO BILATERAL  Result Date: 11/21/2021 CLINICAL DATA:  History of RIGHT lumpectomy with radiation therapy in 2018 EXAM: DIGITAL DIAGNOSTIC BILATERAL MAMMOGRAM WITH TOMOSYNTHESIS TECHNIQUE: Bilateral digital diagnostic mammography and breast tomosynthesis was performed. COMPARISON:  Previous exam(s). ACR Breast Density Category b: There are scattered areas of fibroglandular density. FINDINGS: Post operative changes are seen in the RIGHTbreast. IMPRESSION: No suspicious mass, distortion, or microcalcifications are identified to suggest presence of malignancy. RECOMMENDATION: No mammographic evidence for malignancy. Per protocol, as the patient is now 2 or more years status post lumpectomy, she may return to annual screening mammography in 1 year. However, given the history of breast cancer,  the patient remains eligible for annual diagnostic mammography if preferred. I have discussed the findings and recommendations with the patient. If applicable, a reminder letter will be sent to the patient regarding the next appointment. BI-RADS CATEGORY  2: Benign. Electronically Signed   By: Nolon Nations M.D.   On: 11/21/2021 10:16     ELIGIBLE FOR AVAILABLE RESEARCH PROTOCOL: no   ASSESSMENT: 66 y.o. White woman status post right breast lower outer quadrant biopsy 11/11/2016 for a clinical TX N), stage I invasive ductal carcinoma, grade 2, E-cadherin positive, estrogen receptor 95% positive, progesterone receptor 5% positive, with an MIB-1 of 10%, and no HER-2 amplification  (1) Status post right lumpectomy and right axillary sentinel lymph node sampling 12/16/2016 for a pT1c pN0, stage IA invasive ductal carcinoma, grade 1, with negative margins.  Total of 3 sentinel lymph nodes removed  (2) The Oncotype DX score was 18, predicting a risk of outside the breast recurrence over the next 10 years of 11% if the patient's only systemic therapy is tamoxifen for 5 years.  It also predicts no benefit from chemotherapy.  (3) Adjuvant radiation 02/05/2017-03/05/2017 Site/dose:    1. Right breast, 2.67 Gy in 15 fractions for a total dose of 40.05 Gy                    2. Right breast boost, 2 Gy in 5 fractions for a total dose of 10 Gy   (4) anastrozole started February 2019  (a) not a good tamoxifen candidate given problem #5  (b)  bone density at breast center 10/04/2015 with T-score of -0.3 (normal)  (c) bone density 12/28/2018 showed a T score of -0.4 (normal)  (d) bone density 04/26/2021  (5) history of DVT/PE March 2010, with negative extensive hypercoagulable workup, IVC filter in place   PLAN:   She is here for follow-up on adjuvant anastrozole.  She will complete antiestrogen therapy in February 2024 but would like to go up to 7 years.  She is tolerating this very well.  She  denies any complaints at all.  Most recent mammogram once again unremarkable, no evidence of malignancy.  Physical examination today benign, no palpable masses or regional adenopathy. She will continue vitamin D and calcium supplementation and we also discussed about considering some weight lifting or some band exercises/resistance training for bone density improvement.  We have discussed that her osteopenia is relatively considered mild hence there is no immediate need for medication. She will continue mammograms annually and return to clinic in 1 year or sooner as needed.  Self breast exam recommended. Total time spent: 30 minutes  *Total Encounter Time as defined by the Centers for Medicare and Medicaid Services includes, in addition to the face-to-face time of a patient visit (documented in the note above) non-face-to-face time: obtaining and reviewing outside history, ordering and reviewing medications, tests or procedures, care coordination (communications with other health care professionals or caregivers) and documentation in the medical record.

## 2021-12-18 DIAGNOSIS — U071 COVID-19: Secondary | ICD-10-CM | POA: Diagnosis not present

## 2021-12-25 DIAGNOSIS — H93293 Other abnormal auditory perceptions, bilateral: Secondary | ICD-10-CM | POA: Diagnosis not present

## 2021-12-25 DIAGNOSIS — R42 Dizziness and giddiness: Secondary | ICD-10-CM | POA: Diagnosis not present

## 2021-12-26 NOTE — Progress Notes (Unsigned)
Cardiology Office Note   Date:  12/27/2021   ID:  Allison, Laura Apr 29, 1955, MRN [id number]  PCP:  Shanon Rosser, PA-C  Cardiologist:   Minus Breeding, MD    Chief Complaint  Patient presents with   Palpitations     History of Present Illness: [name] is a 66 y.o. female who presents for follow up of palpitations.  I saw her in 2019  She has had palpitations in the past and reports a treadmill test years ago in [place].   She has a history of DVT/PE and IVC filter several years ago.     She had chest pain when I saw her and I sent her for a POET (Plain Old Exercise Treadmill).  This was negative for ischemia.    At the last visit she had palpitations and I gave her as needed propranolol.  She returns to follow-up with this but she said she has not had to take any of the propranolol.  She is not feeling palpitations.  She has not been having any presyncope or syncope.  She denies any new shortness of breath, PND or orthopnea.  She had no chest pain.  She was also to do home it sleep test because of fatigue and she has it but she has yet to use it.   Past Medical History:  Diagnosis Date   ALLERGIC RHINITIS 03/30/2008   Qualifier: Diagnosis of  By: Jenny Reichmann MD, Hunt Oris    Anxiety    Arthritis    neck   Breast cancer ([place]) 2018   Right Breast Cancer   Deep venous thrombosis ([place]) 01/26/2021   Dysarthria 09/22/2012   GERD (gastroesophageal reflux disease)    Graves' disease 09/03/2014   Headache(784.0) 09/22/2012   History of hiatal hernia    History of radiation therapy 02/05/2017   02/05/17-03/05/17,  right breast 40.05 Gy in 15 fractions, right breast boost 10 Gy in 5 fractions   Hyperlipidemia    Hypertension    Hyperthyroidism    Iron deficiency anemia    LEG PAIN, RIGHT 04/22/2008   Qualifier: Diagnosis of  By: Jenny Reichmann MD, Hunt Oris    Low back pain 01/26/2021   Malignant tumor of breast ([place]) 11/13/2016   Palpitation 09/24/2013   Personal history of  radiation therapy 2018   Right Breast Cancer   Pulmonary emboli ([place]) 11/20/2016   Uterine fibroid    Vitamin D deficiency    Graves disease    Past Surgical History:  Procedure Laterality Date   BREAST LUMPECTOMY Right 12/16/2016   BREAST LUMPECTOMY WITH RADIOACTIVE SEED AND SENTINEL LYMPH NODE BIOPSY Right 12/16/2016   Procedure: RIGHT BREAST LUMPECTOMY WITH RADIOACTIVE SEED AND SENTINEL LYMPH NODE BIOPSY;  Surgeon: Rolm Bookbinder, MD;  Location: [facility];  Service: General;  Laterality: Right;   CHOLECYSTECTOMY     COLONOSCOPY     DILATION AND CURETTAGE OF UTERUS     01/2008   ESOPHAGOGASTRODUODENOSCOPY     ivc filter       Current Outpatient Medications  Medication Sig Dispense Refill   anastrozole (ARIMIDEX) 1 MG tablet TAKE 1 TABLET BY MOUTH EVERY DAY 90 tablet 4   Ascorbic Acid (VITAMIN C) 1000 MG tablet Take 1,000 mg by mouth daily.     aspirin EC 81 MG tablet Take 1 tablet (81 mg total) by mouth daily. 90 tablet 3   atorvastatin (LIPITOR) 20 MG tablet Take 20 mg by mouth daily.     candesartan (  ATACAND) 8 MG tablet Take 1 tablet (8 mg total) by mouth daily.     Cholecalciferol (VITAMIN D-3 PO) Take 1 capsule by mouth daily.     hydrALAZINE (APRESOLINE) 10 MG tablet Take 0.5 tablets (5 mg total) by mouth as needed (for diastolic greater than 627 for up to 2 doses.). 30 tablet 3   LORazepam (ATIVAN) 0.5 MG tablet Take 0.5 mg by mouth 2 (two) times daily as needed.     methimazole (TAPAZOLE) 5 MG tablet Take 0.5 tablets (2.5 mg total) by mouth daily. 45 tablet 2   Omega-3 Fatty Acids (OMEGA-3 FISH OIL PO) Take 2,000 mg by mouth.     propranolol (INDERAL) 10 MG tablet Take 1 tablet (10 mg total) by mouth at bedtime as needed. 30 tablet 1   No current facility-administered medications for this visit.    Allergies:   Amoxicillin-pot clavulanate and Niacin-lovastatin er    ROS:  Please see the history of present illness.   Otherwise, review of systems are positive for  none.   All other systems are reviewed and negative.    PHYSICAL EXAM: VS:  BP 110/78   Pulse 79   Ht '5\' 6"'$  (1.676 m)   Wt 187 lb 6.4 oz (85 kg)   LMP 06/18/2010   BMI 30.25 kg/m  , BMI Body mass index is 30.25 kg/m.  GENERAL:  Well appearing NECK:  No jugular venous distention, waveform within normal limits, carotid upstroke brisk and symmetric, no bruits, no thyromegaly LUNGS:  Clear to auscultation bilaterally CHEST:  Unremarkable HEART:  PMI not displaced or sustained,S1 and S2 within normal limits, no S3, no S4, no clicks, no rubs, soft apical systolic murmur radiating slightly at the aortic outflow tract, no diastolic murmurs ABD:  Flat, positive bowel sounds normal in frequency in pitch, no bruits, no rebound, no guarding, no midline pulsatile mass, no hepatomegaly, no splenomegaly EXT:  2 plus pulses throughout, no edema, no cyanosis no clubbing   EKG:  EKG is not ordered today.   Recent Labs: 05/30/2021: Hemoglobin 12.3; Platelet Count 139 08/31/2021: ALT 13; BUN 17; Creatinine, Ser 0.91; Potassium 4.4; Sodium 136; TSH 1.28    Lipid Panel    Component Value Date/Time   CHOL 153 01/26/2021 1025   TRIG 103 01/26/2021 1025   HDL 49 01/26/2021 1025   CHOLHDL 3.1 01/26/2021 1025   CHOLHDL 4.4 04/06/2010 1037   VLDL 16 04/06/2010 1037   LDLCALC 85 01/26/2021 1025      Wt Readings from Last 3 Encounters:  12/27/21 187 lb 6.4 oz (85 kg)  11/27/21 185 lb 14.4 oz (84.3 kg)  10/10/21 183 lb 12.8 oz (83.4 kg)      Other studies Reviewed: Additional studies/ records that were reviewed today include:    Labs Review of the above records demonstrates:    See elsewhere   ASSESSMENT AND PLAN:  PALPITATIONS:   She is no longer bothered by these.  No change in therapy.  HTN:   Her blood pressure is at target.  No change in therapy.   BRUIT: She had no stenosis on carotid Dopplers.   FATIGUE:   I have encouraged her to continue with the sleep study.  I also encouraged  her to stop drinking caffeinated tea in the evening.  CHEST PAIN:    She had a negative POET (Plain Old Exercise Treadmill)      Current medicines are reviewed at length with the patient today.  The patient does  not have concerns regarding medicines.  The following changes have been made:   None  Labs/ tests ordered today include:     None   No orders of the defined types were placed in this encounter.   Disposition:   FU with me in one year  Signed, Minus Breeding, MD  12/27/2021 1:21 PM    [facility]

## 2021-12-27 ENCOUNTER — Telehealth: Payer: Self-pay

## 2021-12-27 ENCOUNTER — Ambulatory Visit: Payer: Federal, State, Local not specified - PPO | Attending: Cardiology | Admitting: Cardiology

## 2021-12-27 ENCOUNTER — Encounter: Payer: Self-pay | Admitting: Cardiology

## 2021-12-27 VITALS — BP 110/78 | HR 79 | Ht 66.0 in | Wt 187.4 lb

## 2021-12-27 DIAGNOSIS — R002 Palpitations: Secondary | ICD-10-CM

## 2021-12-27 DIAGNOSIS — R072 Precordial pain: Secondary | ICD-10-CM

## 2021-12-27 DIAGNOSIS — R0989 Other specified symptoms and signs involving the circulatory and respiratory systems: Secondary | ICD-10-CM

## 2021-12-27 NOTE — Telephone Encounter (Signed)
I called the patient to let her know that she is ok to still wear the Itamar device this weekend.

## 2021-12-27 NOTE — Patient Instructions (Signed)
Medication Instructions:  Your physician recommends that you continue on your current medications as directed. Please refer to the Current Medication list given to you today.  *If you need a refill on your cardiac medications before your next appointment, please call your pharmacy*  Follow-Up: At [facility], you and your health needs are our priority.  As part of our continuing mission to provide you with exceptional heart care, we have created designated Provider Care Teams.  These Care Teams include your primary Cardiologist (physician) and Advanced Practice Providers (APPs -  Physician Assistants and Nurse Practitioners) who all work together to provide you with the care you need, when you need it.  We recommend signing up for the patient portal called "MyChart".  Sign up information is provided on this After Visit Summary.  MyChart is used to connect with patients for Virtual Visits (Telemedicine).  Patients are able to view lab/test results, encounter notes, upcoming appointments, etc.  Non-urgent messages can be sent to your provider as well.   To learn more about what you can do with MyChart, go to [email or web address].    Your next appointment:   12 month(s)  The format for your next appointment:   In Person  Provider:   James Hochrein, MD      

## 2022-01-30 DIAGNOSIS — Z01419 Encounter for gynecological examination (general) (routine) without abnormal findings: Secondary | ICD-10-CM | POA: Diagnosis not present

## 2022-01-30 DIAGNOSIS — Z113 Encounter for screening for infections with a predominantly sexual mode of transmission: Secondary | ICD-10-CM | POA: Diagnosis not present

## 2022-02-13 ENCOUNTER — Telehealth: Payer: Self-pay

## 2022-02-13 NOTE — Telephone Encounter (Signed)
I spoke with the patient about wearing her itamar device. She states that she will try to get the study done this week. I reminded her that she will be billed if the device isn't returned or used as soon as possible. She verbalized the understanding and thanked me for calling her to remind her to complete the study or return the device to avoid from getting billed.

## 2022-02-20 ENCOUNTER — Telehealth: Payer: Self-pay

## 2022-02-20 NOTE — Telephone Encounter (Signed)
Patient stopped by the office to bring the Itamar back due to not completing the test in a timely manner. I explained to the patient that her insurance will still cover the device if she would like to complete the test this weekend. The patient decided to keep the watch and try to complete it by the weekend.

## 2022-02-26 DIAGNOSIS — J209 Acute bronchitis, unspecified: Secondary | ICD-10-CM | POA: Diagnosis not present

## 2022-03-05 ENCOUNTER — Ambulatory Visit: Payer: Federal, State, Local not specified - PPO | Admitting: Internal Medicine

## 2022-03-09 ENCOUNTER — Ambulatory Visit: Payer: Federal, State, Local not specified - PPO | Attending: Cardiology

## 2022-03-09 DIAGNOSIS — R002 Palpitations: Secondary | ICD-10-CM

## 2022-03-11 ENCOUNTER — Telehealth: Payer: Self-pay | Admitting: Cardiology

## 2022-03-11 DIAGNOSIS — R0683 Snoring: Secondary | ICD-10-CM

## 2022-03-11 NOTE — Telephone Encounter (Signed)
Sueanne Margarita, MD  Freada Bergeron, CMA Study  needs to be repeated due to lack of sleep time.  Patient will retake the test.

## 2022-03-11 NOTE — Telephone Encounter (Signed)
Spoke with pt regarding her Itamar. Pt states that she wore for over an hour but accidentally swipped phone and it turned off. Per information received only 8 minutes of information was collected so therefore, results are inconclusive. Advised pt that she can throw the one that she has away. Pt wants to know if she will be charged from this devise. Told her I would find out. Will also send over to Dr. Percival Spanish to see if he wants to move forward with another Itamar or in lab sleep study. Pt verbalizes understanding.

## 2022-03-11 NOTE — Telephone Encounter (Signed)
Patient states she called a toll free # on her sleep watch and was advised that they received the information but they did not received enough information. She would like to know if it would be lright to dispose of her sleep equipment. Please advise.

## 2022-03-12 DIAGNOSIS — R059 Cough, unspecified: Secondary | ICD-10-CM | POA: Diagnosis not present

## 2022-03-12 DIAGNOSIS — J40 Bronchitis, not specified as acute or chronic: Secondary | ICD-10-CM | POA: Diagnosis not present

## 2022-03-12 NOTE — Telephone Encounter (Signed)
Left message for pt, this is the second time she has had problems with the itimar watch. Per dr hochrein will set her up for a split night in house sleep study. She is to call with questions.

## 2022-03-20 DIAGNOSIS — R7303 Prediabetes: Secondary | ICD-10-CM | POA: Diagnosis not present

## 2022-04-03 ENCOUNTER — Other Ambulatory Visit: Payer: Self-pay | Admitting: Internal Medicine

## 2022-04-05 ENCOUNTER — Encounter: Payer: Self-pay | Admitting: Internal Medicine

## 2022-04-05 ENCOUNTER — Ambulatory Visit: Payer: Federal, State, Local not specified - PPO | Admitting: Internal Medicine

## 2022-04-05 VITALS — BP 120/72 | HR 76 | Ht 66.0 in | Wt 187.0 lb

## 2022-04-05 DIAGNOSIS — E0590 Thyrotoxicosis, unspecified without thyrotoxic crisis or storm: Secondary | ICD-10-CM

## 2022-04-05 DIAGNOSIS — E0500 Thyrotoxicosis with diffuse goiter without thyrotoxic crisis or storm: Secondary | ICD-10-CM | POA: Diagnosis not present

## 2022-04-05 LAB — TSH: TSH: <0.01 u[IU]/mL — ABNORMAL LOW (ref 0.35–5.50)

## 2022-04-05 LAB — T4, FREE: Free T4: 3.13 ng/dL — ABNORMAL HIGH (ref 0.60–1.60)

## 2022-04-05 MED ORDER — METHIMAZOLE 5 MG PO TABS: 5.0000 mg | ORAL_TABLET | Freq: Every day | ORAL | 2 refills | Status: DC

## 2022-04-05 NOTE — Progress Notes (Signed)
Name: [name]  MRN/ DOB: [id number], February 01, 1956    Age/ Sex: 67 y.o., female     PCP: Shanon Rosser, PA-C   Reason for Endocrinology Evaluation: Graves' Disease      Initial Endocrinology Clinic Visit: 02/16/2020    PATIENT IDENTIFIER: [name] is a 67 y.o., female with a past medical history of Hx of breast ca ( S/P right lumpectomy 2018 and radiation ) and Graves' disease . She has followed with [facility] since 02/16/2020 for consultative assistance with management of her hyperthyroidism.   HISTORICAL SUMMARY:   She has been diagnosed with Graves' disease in 2016. Had a thyroid uptake and scan on 08/13/2014 at 57.5 % of I-131 , uniform uptake consistent with Graves's disease. Has been on Methimazole since her diagnosis.    SUBJECTIVE:    Today (04/05/2022):  [name] is here for a follow up on Graves disease    She had a follow-up with cardiology for palpitations, was prescribed propranolol as needed 12/27/2021 She continues to follow-up with oncology for history of breast cancer 11/27/2021 Weight has been stable  She continues with occasional palpitations as well as anxiety and foggy feeling   Recently treated for bronchitis , had SOB while walking here today  Denies constipation or diarrhea  Minimal  tremors Denies local neck symptoms    HOME ENDOCRINE MEDICATION  Methimazole 5 mg daily , half a tablet daily        HISTORY:  Past Medical History:  Past Medical History:  Diagnosis Date   ALLERGIC RHINITIS 03/30/2008   Qualifier: Diagnosis of  By: Jenny Reichmann MD, Hunt Oris    Anxiety    Arthritis    neck   Breast cancer ([facility]) 2018   Right Breast Cancer   Deep venous thrombosis ([facility]) 01/26/2021   Dysarthria 09/22/2012   GERD (gastroesophageal reflux disease)    Graves' disease 09/03/2014   Headache(784.0) 09/22/2012   History of hiatal hernia    History of radiation therapy 02/05/2017   02/05/17-03/05/17,  right breast 40.05 Gy in  15 fractions, right breast boost 10 Gy in 5 fractions   Hyperlipidemia    Hypertension    Hyperthyroidism    Iron deficiency anemia    LEG PAIN, RIGHT 04/22/2008   Qualifier: Diagnosis of  By: Jenny Reichmann MD, Hunt Oris    Low back pain 01/26/2021   Malignant tumor of breast ([facility]) 11/13/2016   Palpitation 09/24/2013   Personal history of radiation therapy 2018   Right Breast Cancer   Pulmonary emboli ([facility]) 11/20/2016   Uterine fibroid    Vitamin D deficiency    Graves disease   Past Surgical History:  Past Surgical History:  Procedure Laterality Date   BREAST LUMPECTOMY Right 12/16/2016   BREAST LUMPECTOMY WITH RADIOACTIVE SEED AND SENTINEL LYMPH NODE BIOPSY Right 12/16/2016   Procedure: RIGHT BREAST LUMPECTOMY WITH RADIOACTIVE SEED AND SENTINEL LYMPH NODE BIOPSY;  Surgeon: Rolm Bookbinder, MD;  Location: [place] [facility];  Service: General;  Laterality: Right;   CHOLECYSTECTOMY     COLONOSCOPY     DILATION AND CURETTAGE OF UTERUS     01/2008   ESOPHAGOGASTRODUODENOSCOPY     ivc filter     Social History:  reports that she quit smoking about 19 years ago. Her smoking use included cigarettes. She has a 4.00 pack-year smoking history. She has never used smokeless tobacco. She reports current alcohol use. She reports that she does not use drugs. Family History:  Family History  Problem Relation Age of Onset   Drug abuse Brother    Hypertension Mother    Colon cancer Maternal Grandfather      HOME MEDICATIONS: Allergies as of 04/05/2022       Reactions   Amoxicillin-pot Clavulanate Other (See Comments)   Dizziness   Niacin-lovastatin Er Other (See Comments)   Caused flushing        Medication List        Accurate as of April 05, 2022  3:11 PM. If you have any questions, ask your nurse or doctor.          STOP taking these medications    propranolol 10 MG tablet Commonly known as: INDERAL Stopped by: Dorita Sciara, MD       TAKE these medications     anastrozole 1 MG tablet Commonly known as: ARIMIDEX TAKE 1 TABLET BY MOUTH EVERY DAY   aspirin EC 81 MG tablet Take 1 tablet (81 mg total) by mouth daily.   atorvastatin 20 MG tablet Commonly known as: LIPITOR Take 20 mg by mouth daily.   candesartan 8 MG tablet Commonly known as: Atacand Take 1 tablet (8 mg total) by mouth daily.   hydrALAZINE 10 MG tablet Commonly known as: APRESOLINE Take 0.5 tablets (5 mg total) by mouth as needed (for diastolic greater than [id number] for up to 2 doses.).   LORazepam 0.5 MG tablet Commonly known as: ATIVAN Take 0.5 mg by mouth 2 (two) times daily as needed.   methimazole 5 MG tablet Commonly known as: TAPAZOLE Take 1 tablet (5 mg total) by mouth daily. What changed: how much to take Changed by: Dorita Sciara, MD   OMEGA-3 FISH OIL PO Take 2,000 mg by mouth.   vitamin C 1000 MG tablet Take 1,000 mg by mouth daily.   VITAMIN D-3 PO Take 1 capsule by mouth daily.          OBJECTIVE:   PHYSICAL EXAM: VS: BP 120/72 (BP Location: Left Arm, Patient Position: Sitting, Cuff Size: Large)   Pulse 76   Ht '5\' 6"'$  (1.676 m)   Wt 187 lb (84.8 kg)   LMP 06/18/2010   SpO2 99%   BMI 30.18 kg/m    EXAM: General: Pt appears well and is in NAD  Eyes: External eye exam normal without stare, lid lag or exophthalmos.  EOM intact.    Neck: General: Supple without adenopathy. Thyroid: Thyroid asymmetry noted on exam today. No thyroid bruit.  Lungs: Clear with good BS bilat with no rales, rhonchi, or wheezes  Heart: Auscultation: RRR.  Abdomen: soft, nontender, without masses or organomegaly palpable  Mental Status: Judgment, insight: Intact Orientation: Oriented to time, place, and person Mood and affect: No depression, anxiety, or agitation     DATA REVIEWED:  Latest Reference Range & Units 08/31/21 12:58  Sodium 135 - 145 mEq/L 136  Potassium 3.5 - 5.1 mEq/L 4.4  Chloride 96 - 112 mEq/L 102  CO2 19 - 32 mEq/L 28  Glucose  70 - 99 mg/dL 103 (H)  BUN 6 - 23 mg/dL 17  Creatinine 0.40 - 1.20 mg/dL 0.91  Calcium 8.4 - 10.5 mg/dL 9.7  Alkaline Phosphatase 39 - 117 U/L 113  Albumin 3.5 - 5.2 g/dL 4.2  AST 0 - 37 U/L 17  ALT 0 - 35 U/L 13  Total Protein 6.0 - 8.3 g/dL 7.8  Total Bilirubin 0.2 - 1.2 mg/dL 0.3  GFR >60.00 mL/min 67.02    Latest Reference Range &  Units 08/31/21 12:58  TSH 0.35 - 5.50 uIU/mL 1.28  T4,Free(Direct) 0.60 - 1.60 ng/dL 0.83      ASSESSMENT / PLAN / RECOMMENDATIONS:    Hyperthyroidism secondary to Graves' Disease:   - No local neck symptoms - Has been on methimazole since 2016, since she is on a small dose of methimazole we have opted to postpone radioactive iodine ablation - We briefly discussed RAI ablation, but at this time and given her Hx of breast Ca. ( S/p radiation but no chemo) I would hold off as the risk outweighs the benefit  -Her TFTs today are consistent with hyperthyroidism, which is very perplexing and she has been stable for the past 2 years, she assures me compliance with methimazole intake? -I did verify pickup history with the pharmacy of 45 tablets on 12/03/2021, there was a processing of 10-day supply on 02/28/2022, pharmacist was unable to confirm pickup due to computer issue , but was able to confirm and pick up a 45 tablets on 03/12/2022  -I will have no option but to increase methimazole dose, patient was advised to use a pillbox -A portal message was sent to the patient to schedule lab appointment in 2 months  Medications : Increase methimazole 5 mg, 1 tablet  daily     2. Graves' Disease:     No extra-thyroidal manifestations of graves' disease  3.  Thyromegaly:  -Will proceed with thyroid ultrasound     Follow-up in 6 months  I spent 25 minutes preparing to see the patient by review of recent labs, imaging and procedures, obtaining and reviewing separately obtained history, communicating with the patient ordering medications, tests or  procedures, and documenting clinical information in the EHR including the differential Dx, treatment, and any further evaluation and other management     Signed electronically by: Mack Guise, MD  [facility]  [address] [place], [place] [address] Phone: [phone] FAX: [phone]      CC: Ledell Noss [address] [place] [address] Phone: [phone]  Fax: [phone]   Return to Endocrinology clinic as below: Future Appointments  Date Time Provider [place]  05/02/2022  1:00 PM [facility]315 [place] 3 [id number] GI[facility] [address] [facility]  10/15/2022  1:00 PM Consuelo Suthers, Melanie Crazier, MD [facility] None  12/05/2022 11:15 AM [facility] None  12/05/2022 11:45 AM Benay Pike, MD [facility] None

## 2022-04-12 ENCOUNTER — Telehealth: Payer: Self-pay | Admitting: *Deleted

## 2022-04-12 NOTE — Telephone Encounter (Signed)
Prior Authorization for split night sleep study sent to [employer] via Phone. Approval # [id number]. Valid dates 04/10/22 to 05/09/22.

## 2022-04-17 ENCOUNTER — Other Ambulatory Visit: Payer: Self-pay | Admitting: *Deleted

## 2022-04-17 MED ORDER — ANASTROZOLE 1 MG PO TABS: 1.0000 mg | ORAL_TABLET | Freq: Every day | ORAL | 4 refills | Status: DC

## 2022-04-19 ENCOUNTER — Other Ambulatory Visit: Payer: Self-pay | Admitting: *Deleted

## 2022-04-19 DIAGNOSIS — R0683 Snoring: Secondary | ICD-10-CM

## 2022-04-23 ENCOUNTER — Telehealth: Payer: Self-pay | Admitting: Internal Medicine

## 2022-04-23 NOTE — Telephone Encounter (Signed)
Patient has been advised and has scheduled appointment for labs

## 2022-04-23 NOTE — Telephone Encounter (Signed)
Patient is calling to check on her most recent labs results.

## 2022-05-02 ENCOUNTER — Ambulatory Visit
Admission: RE | Admit: 2022-05-02 | Discharge: 2022-05-02 | Disposition: A | Discharge status: Home / Self Care | Payer: Federal, State, Local not specified - PPO | Source: Ambulatory Visit | Attending: Internal Medicine | Admitting: Internal Medicine

## 2022-05-02 DIAGNOSIS — E041 Nontoxic single thyroid nodule: Secondary | ICD-10-CM | POA: Diagnosis not present

## 2022-05-02 DIAGNOSIS — E0590 Thyrotoxicosis, unspecified without thyrotoxic crisis or storm: Secondary | ICD-10-CM

## 2022-05-03 DIAGNOSIS — E785 Hyperlipidemia, unspecified: Secondary | ICD-10-CM | POA: Diagnosis not present

## 2022-05-03 DIAGNOSIS — R7303 Prediabetes: Secondary | ICD-10-CM | POA: Diagnosis not present

## 2022-05-03 DIAGNOSIS — I10 Essential (primary) hypertension: Secondary | ICD-10-CM | POA: Diagnosis not present

## 2022-05-04 LAB — CMP 10231: EGFR: 100

## 2022-05-04 LAB — HEMOGLOBIN A1C: A1c: 6.4

## 2022-05-06 ENCOUNTER — Encounter (HOSPITAL_BASED_OUTPATIENT_CLINIC_OR_DEPARTMENT_OTHER): Payer: Federal, State, Local not specified - PPO | Admitting: Cardiovascular Disease

## 2022-05-07 DIAGNOSIS — E0590 Thyrotoxicosis, unspecified without thyrotoxic crisis or storm: Secondary | ICD-10-CM | POA: Diagnosis not present

## 2022-05-09 ENCOUNTER — Encounter (HOSPITAL_BASED_OUTPATIENT_CLINIC_OR_DEPARTMENT_OTHER): Payer: Federal, State, Local not specified - PPO | Admitting: Cardiovascular Disease

## 2022-05-14 ENCOUNTER — Telehealth: Payer: Self-pay

## 2022-05-15 NOTE — Telephone Encounter (Signed)
Duplicate

## 2022-05-20 DIAGNOSIS — J069 Acute upper respiratory infection, unspecified: Secondary | ICD-10-CM | POA: Diagnosis not present

## 2022-05-20 DIAGNOSIS — R059 Cough, unspecified: Secondary | ICD-10-CM | POA: Diagnosis not present

## 2022-05-23 ENCOUNTER — Telehealth: Payer: Self-pay | Admitting: Internal Medicine

## 2022-05-23 DIAGNOSIS — D72819 Decreased white blood cell count, unspecified: Secondary | ICD-10-CM | POA: Diagnosis not present

## 2022-05-23 DIAGNOSIS — E0590 Thyrotoxicosis, unspecified without thyrotoxic crisis or storm: Secondary | ICD-10-CM | POA: Diagnosis not present

## 2022-05-23 DIAGNOSIS — R7303 Prediabetes: Secondary | ICD-10-CM | POA: Diagnosis not present

## 2022-05-23 NOTE — Telephone Encounter (Addendum)
Patient is calling to say that she had her TSH drawn at [facility] about 2 weeks ago and that the TSH level came back -0.01.  Patient states that [facility] will be sending over the results.  Patient is upset saying that she was never told that her TSH level back in February 2024 was extremely low until about a month later.  Patient states that she is very sick now and has just left [facility] office a few minutes ago.

## 2022-05-23 NOTE — Telephone Encounter (Signed)
Patient states that she is experiencing  some nausea and dizziness. Patient had thyroid checked about 2 weeks ago. Patient would like to know what she needs to do. Patient is aware that Dr. Lonzo Cloud is out but would like to have this address by on call provider if possible.

## 2022-05-24 ENCOUNTER — Other Ambulatory Visit: Payer: Federal, State, Local not specified - PPO

## 2022-05-24 DIAGNOSIS — E0590 Thyrotoxicosis, unspecified without thyrotoxic crisis or storm: Secondary | ICD-10-CM | POA: Diagnosis not present

## 2022-05-24 NOTE — Telephone Encounter (Signed)
Patient coming in for lab today and states that she is not having dizziness and nausea but having Fatigue and weakness. Patient  upset because she doesn't check her mychart often and doesn't want use that form of communication.  She is taking 1 tablet daily. Patient was very agitated so it was hard to have a conversation with her.

## 2022-05-24 NOTE — Addendum Note (Signed)
Addended by: Bernerd Pho I on: 05/24/2022 02:08 PM   Modules accepted: Orders

## 2022-05-24 NOTE — Addendum Note (Signed)
Addended by: Bernerd Pho I on: 05/24/2022 02:16 PM   Modules accepted: Orders

## 2022-05-24 NOTE — Telephone Encounter (Signed)
LMTCB

## 2022-05-25 LAB — T4, FREE: Free T4: 4.39 ng/dL — ABNORMAL HIGH (ref 0.82–1.77)

## 2022-05-27 ENCOUNTER — Telehealth: Payer: Self-pay | Admitting: Internal Medicine

## 2022-05-27 DIAGNOSIS — E0500 Thyrotoxicosis with diffuse goiter without thyrotoxic crisis or storm: Secondary | ICD-10-CM

## 2022-05-27 DIAGNOSIS — E0590 Thyrotoxicosis, unspecified without thyrotoxic crisis or storm: Secondary | ICD-10-CM

## 2022-05-27 MED ORDER — METHIMAZOLE 5 MG PO TABS: 10.0000 mg | ORAL_TABLET | Freq: Every day | ORAL | 2 refills | Status: DC

## 2022-05-27 NOTE — Telephone Encounter (Signed)
Please let the pt know that her thyroid continues to be elevated and the one tablet of methimazole is not enough.    Please increase Methimazole to TWO tablets every morning from now on    Please schedule her for repeat labs in 2 months   Thanks

## 2022-05-27 NOTE — Telephone Encounter (Signed)
LMTCB

## 2022-05-28 DIAGNOSIS — Z0000 Encounter for general adult medical examination without abnormal findings: Secondary | ICD-10-CM | POA: Diagnosis not present

## 2022-05-28 NOTE — Telephone Encounter (Signed)
Patient advised and lab appointment scheduled 

## 2022-06-10 ENCOUNTER — Telehealth: Payer: Self-pay

## 2022-06-10 DIAGNOSIS — D72819 Decreased white blood cell count, unspecified: Secondary | ICD-10-CM | POA: Diagnosis not present

## 2022-06-10 NOTE — Telephone Encounter (Signed)
Patient states that she has been taking her medication as prescribe and she is starting to feel better slowly.

## 2022-06-21 ENCOUNTER — Other Ambulatory Visit: Payer: Federal, State, Local not specified - PPO

## 2022-07-15 ENCOUNTER — Telehealth: Payer: Self-pay | Admitting: Internal Medicine

## 2022-07-15 ENCOUNTER — Other Ambulatory Visit (INDEPENDENT_AMBULATORY_CARE_PROVIDER_SITE_OTHER): Payer: Federal, State, Local not specified - PPO

## 2022-07-15 DIAGNOSIS — E0590 Thyrotoxicosis, unspecified without thyrotoxic crisis or storm: Secondary | ICD-10-CM | POA: Diagnosis not present

## 2022-07-15 LAB — TSH: TSH: <0.01 u[IU]/mL — ABNORMAL LOW (ref 0.35–5.50)

## 2022-07-15 LAB — T4, FREE: Free T4: 1.65 ng/dL — ABNORMAL HIGH (ref 0.60–1.60)

## 2022-07-15 MED ORDER — METHIMAZOLE 5 MG PO TABS: 15.0000 mg | ORAL_TABLET | Freq: Every day | ORAL | 2 refills | Status: DC

## 2022-07-15 NOTE — Telephone Encounter (Signed)
Please let the patient know that her thyroid continues to be overactive but it is so much better than what it was   My suggestion is to take 3 tablets of methimazole daily every morning  Please schedule the patient for repeat labs in 6 weeks   Thanks

## 2022-07-15 NOTE — Telephone Encounter (Signed)
Pt has been notified and voices understanding. Please get her scheduled for 6 weeks lab .

## 2022-07-30 ENCOUNTER — Other Ambulatory Visit: Payer: Federal, State, Local not specified - PPO

## 2022-08-27 ENCOUNTER — Other Ambulatory Visit (INDEPENDENT_AMBULATORY_CARE_PROVIDER_SITE_OTHER): Payer: Federal, State, Local not specified - PPO

## 2022-08-27 ENCOUNTER — Telehealth: Payer: Self-pay | Admitting: Cardiology

## 2022-08-27 ENCOUNTER — Telehealth: Payer: Self-pay

## 2022-08-27 DIAGNOSIS — E0590 Thyrotoxicosis, unspecified without thyrotoxic crisis or storm: Secondary | ICD-10-CM | POA: Diagnosis not present

## 2022-08-27 DIAGNOSIS — Z86718 Personal history of other venous thrombosis and embolism: Secondary | ICD-10-CM

## 2022-08-27 LAB — T4, FREE: Free T4: 0.65 ng/dL (ref 0.60–1.60)

## 2022-08-27 LAB — TSH: TSH: 0.01 u[IU]/mL — ABNORMAL LOW (ref 0.35–5.50)

## 2022-08-27 NOTE — Telephone Encounter (Signed)
Caller: Patient  Concern: New onset of thigh pain, began with swollen knee on Sunday, now anterior thigh is painful causing her to limp with ambulation. Pt recently traveled to [place] by car and train, she has not been wearing compressions, but put them on yesterday.  Pt denies any firm, red, or warm areas. Cardiology deferred her to [facility].  Location: left leg  Description: sudden after trip  Quality: aching  Treatments: ibuprofen (OTC) and elevation and compression  Resolution: Appointment scheduled for earliest available  Next Appt: Appointment scheduled for 7/17 @ 1300 for [place]. Pt aware of possible wait between [place] and MD appts.

## 2022-08-27 NOTE — Telephone Encounter (Signed)
Informed patient to contact [facility] to see if they will order doppler for her legs or if she needs to be seen by Dr. Edilia Bo. She stated she don't know why we can't order the test. She got upset and hung up on me. I tried to call back and it went to voicemail. Left voicemail to return call to office.

## 2022-08-27 NOTE — Telephone Encounter (Signed)
Patient states she has been having pains in her legs and wanted to do a scan to make sure everything is still ok. Last scan was done last august

## 2022-08-27 NOTE — Telephone Encounter (Signed)
Pt called in stating her leg has been hurting and she would like another scan of her legs. Please advise if this can be ordered.

## 2022-08-28 ENCOUNTER — Encounter: Payer: Self-pay | Admitting: Vascular Surgery

## 2022-08-28 ENCOUNTER — Ambulatory Visit (HOSPITAL_COMMUNITY)
Admission: RE | Admit: 2022-08-28 | Discharge: 2022-08-28 | Disposition: A | Discharge status: Home / Self Care | Payer: Federal, State, Local not specified - PPO | Source: Ambulatory Visit | Attending: Vascular Surgery | Admitting: Vascular Surgery

## 2022-08-28 ENCOUNTER — Ambulatory Visit: Payer: Federal, State, Local not specified - PPO | Admitting: Vascular Surgery

## 2022-08-28 VITALS — BP 126/82 | HR 64 | Temp 98.0°F | Resp 16 | Ht 66.5 in | Wt 174.0 lb

## 2022-08-28 DIAGNOSIS — Z86718 Personal history of other venous thrombosis and embolism: Secondary | ICD-10-CM | POA: Insufficient documentation

## 2022-08-28 DIAGNOSIS — I872 Venous insufficiency (chronic) (peripheral): Secondary | ICD-10-CM | POA: Diagnosis not present

## 2022-08-28 NOTE — Progress Notes (Signed)
REASON FOR VISIT:   Left LEG PAIN  MEDICAL ISSUES:   LEFT LEG PAIN: Her left leg pain has resolved.  Her venous duplex scan shows no evidence of DVT in the left lower extremity.  I think her symptoms are likely related to her long trip on the train to [place].  I have encouraged her to avoid prolonged sitting and standing.  We discussed the importance of exercise.  I encouraged her to wear her compression stockings especially when she is traveling.  I have encouraged her to elevate her legs daily and we have discussed the proper positioning for this.  She did have some swelling around her knee that has resolved but at this recurs she may need to be evaluated by orthopedics for a possible Baker's cyst.   HISTORY OF IVC FILTER: She is due for follow-up studies of her IVC filter in November of this year.   HPI:   [name] is a pleasant 67 y.o. female who I last saw on 10/10/2021 for spider veins of both lower extremities.  At that time she describes some aching pain and heaviness in her legs which is aggravated by sitting and standing and relieved with elevation.  She had a DVT in the right leg in 2010 and also a pulmonary embolus.  She has been on anticoagulation since that time.  She was treated for breast cancer in 2018.  Her duplex scan at that time showed no evidence of DVT in the left leg.  There was no deep venous reflux.  There was minimal superficial venous reflux in the distal thigh but the vein was not dilated.  Of note, I also saw her in November 2022 for continued follow-up of her IVC filter that was placed by interventional radiology in approximately 2010.  When I originally see her she has an extruded stress but these are not causing any problems.  Follow-up CT scan showed no change when she was seen in June 2020 and June 2022.  She is due for a follow-up visit, ultrasound, and x-ray in November 2024.  She denies any significant abdominal pain.  She was on a 10-hour train  ride to [place] and after that trip noted some pain in her left leg with minimal swelling.  She was concerned about this and comes in for a visit.  The pain and swelling have subsequently resolved.  She denies any chest pain or shortness of breath  Past Medical History:  Diagnosis Date   ALLERGIC RHINITIS 03/30/2008   Qualifier: Diagnosis of  By: Jonny Ruiz MD, Len Blalock    Anxiety    Arthritis    neck   Breast cancer ([facility]) 2018   Right Breast Cancer   Deep venous thrombosis ([facility]) 01/26/2021   Dysarthria 09/22/2012   GERD (gastroesophageal reflux disease)    Graves' disease 09/03/2014   Headache(784.0) 09/22/2012   History of hiatal hernia    History of radiation therapy 02/05/2017   02/05/17-03/05/17,  right breast 40.05 Gy in 15 fractions, right breast boost 10 Gy in 5 fractions   Hyperlipidemia    Hypertension    Hyperthyroidism    Iron deficiency anemia    LEG PAIN, RIGHT 04/22/2008   Qualifier: Diagnosis of  By: Jonny Ruiz MD, Len Blalock    Low back pain 01/26/2021   Malignant tumor of breast ([facility]) 11/13/2016   Palpitation 09/24/2013   Personal history of radiation therapy 2018   Right Breast Cancer   Pulmonary emboli ([facility]) 11/20/2016  Uterine fibroid    Vitamin D deficiency    Graves disease    Family History  Problem Relation Age of Onset   Drug abuse Brother    Hypertension Mother    Colon cancer Maternal Grandfather     SOCIAL HISTORY: Social History   Tobacco Use   Smoking status: Former    Current packs/day: 0.00    Average packs/day: 0.4 packs/day for 10.0 years (4.0 ttl pk-yrs)    Types: Cigarettes    Start date: 05/31/1992    Quit date: 06/01/2002    Years since quitting: 20.2   Smokeless tobacco: Never  Substance Use Topics   Alcohol use: Yes    Comment: occasional 1 drink per month    Allergies  Allergen Reactions   Amoxicillin-Pot Clavulanate Other (See Comments)    Dizziness    Niacin-Lovastatin Er Other (See Comments)    Caused flushing      Current Outpatient Medications  Medication Sig Dispense Refill   anastrozole (ARIMIDEX) 1 MG tablet Take 1 tablet (1 mg total) by mouth daily. 90 tablet 4   Ascorbic Acid (VITAMIN C) 1000 MG tablet Take 1,000 mg by mouth daily.     aspirin EC 81 MG tablet Take 1 tablet (81 mg total) by mouth daily. 90 tablet 3   atorvastatin (LIPITOR) 20 MG tablet Take 20 mg by mouth daily.     candesartan (ATACAND) 8 MG tablet Take 1 tablet (8 mg total) by mouth daily.     Cholecalciferol (VITAMIN D-3 PO) Take 1 capsule by mouth daily.     hydrALAZINE (APRESOLINE) 10 MG tablet Take 0.5 tablets (5 mg total) by mouth as needed (for diastolic greater than 110 for up to 2 doses.). 30 tablet 3   LORazepam (ATIVAN) 0.5 MG tablet Take 0.5 mg by mouth 2 (two) times daily as needed.     methimazole (TAPAZOLE) 5 MG tablet Take 3 tablets (15 mg total) by mouth daily. 270 tablet 2   Omega-3 Fatty Acids (OMEGA-3 FISH OIL PO) Take 2,000 mg by mouth.     No current facility-administered medications for this visit.    REVIEW OF SYSTEMS:  [X]  denotes positive finding, [ ]  denotes negative finding Cardiac  Comments:  Chest pain or chest pressure:    Shortness of breath upon exertion:    Short of breath when lying flat:    Irregular heart rhythm:        Vascular    Pain in calf, thigh, or hip brought on by ambulation: x   Pain in feet at night that wakes you up from your sleep:     Blood clot in your veins:    Leg swelling:  x       Pulmonary    Oxygen at home:    Productive cough:     Wheezing:         Neurologic    Sudden weakness in arms or legs:     Sudden numbness in arms or legs:     Sudden onset of difficulty speaking or slurred speech:    Temporary loss of vision in one eye:     Problems with dizziness:         Gastrointestinal    Blood in stool:     Vomited blood:         Genitourinary    Burning when urinating:     Blood in urine:        Psychiatric    Major depression:  Hematologic    Bleeding problems:    Problems with blood clotting too easily:        Skin    Rashes or ulcers:        Constitutional    Fever or chills:     PHYSICAL EXAM:   Vitals:   08/28/22 1325  BP: 126/82  Pulse: 64  Resp: 16  Temp: 98 F (36.7 C)  TempSrc: Temporal  SpO2: 99%  Weight: 174 lb (78.9 kg)  Height: 5' 6.5" (1.689 m)    GENERAL: The patient is a well-nourished female, in no acute distress. The vital signs are documented above. CARDIAC: There is a regular rate and rhythm.  VASCULAR: I do not detect carotid bruits. She has palpable pedal pulses. She has no significant lower extremity swelling. PULMONARY: There is good air exchange bilaterally without wheezing or rales. ABDOMEN: Soft and non-tender with normal pitched bowel sounds.  MUSCULOSKELETAL: There are no major deformities or cyanosis. NEUROLOGIC: No focal weakness or paresthesias are detected. SKIN: There are no ulcers or rashes noted. PSYCHIATRIC: The patient has a normal affect.  DATA:    VENOUS DUPLEX: I have independently interpreted her venous duplex scan today of the left lower extremity.  There is no evidence of DVT.  There was a cystic structure in the left popliteal fossa.  The differential diagnosis would include a Baker's cyst.  [facility] [phone]

## 2022-09-01 ENCOUNTER — Telehealth: Payer: Self-pay | Admitting: Internal Medicine

## 2022-09-01 NOTE — Telephone Encounter (Signed)
Please let the pt know that her thyroid is improving and to continue current dose of methimazole ( please verify she is on 3 tabs daily )    Thanks

## 2022-09-02 NOTE — Telephone Encounter (Signed)
Spoke to patient and she states that she was on an antibiotic a few months ago and thinks that may have affect her thyroid.

## 2022-09-02 NOTE — Telephone Encounter (Signed)
LMTCB and also sent a mychart.

## 2022-09-10 DIAGNOSIS — M25562 Pain in left knee: Secondary | ICD-10-CM | POA: Diagnosis not present

## 2022-10-15 ENCOUNTER — Ambulatory Visit: Payer: Federal, State, Local not specified - PPO | Admitting: Internal Medicine

## 2022-10-15 ENCOUNTER — Encounter: Payer: Self-pay | Admitting: Internal Medicine

## 2022-10-15 VITALS — BP 126/82 | HR 87 | Ht 66.5 in | Wt 179.4 lb

## 2022-10-15 DIAGNOSIS — E041 Nontoxic single thyroid nodule: Secondary | ICD-10-CM

## 2022-10-15 DIAGNOSIS — R748 Abnormal levels of other serum enzymes: Secondary | ICD-10-CM | POA: Diagnosis not present

## 2022-10-15 DIAGNOSIS — E0590 Thyrotoxicosis, unspecified without thyrotoxic crisis or storm: Secondary | ICD-10-CM

## 2022-10-15 DIAGNOSIS — E0500 Thyrotoxicosis with diffuse goiter without thyrotoxic crisis or storm: Secondary | ICD-10-CM

## 2022-10-15 LAB — COMPREHENSIVE METABOLIC PANEL
ALT: 16 U/L (ref 0–35)
AST: 16 U/L (ref 0–37)
Albumin: 4.2 g/dL (ref 3.5–5.2)
Alkaline Phosphatase: 310 U/L — ABNORMAL HIGH (ref 39–117)
BUN: 13 mg/dL (ref 6–23)
CO2: 26 meq/L (ref 19–32)
Calcium: 9.6 mg/dL (ref 8.4–10.5)
Chloride: 105 meq/L (ref 96–112)
Creatinine, Ser: 0.91 mg/dL (ref 0.40–1.20)
GFR: 66.5 mL/min (ref >60.00)
Glucose, Bld: 115 mg/dL — ABNORMAL HIGH (ref 70–99)
Potassium: 4 meq/L (ref 3.5–5.1)
Sodium: 141 meq/L (ref 135–145)
Total Bilirubin: 0.5 mg/dL (ref 0.2–1.2)
Total Protein: 8 g/dL (ref 6.0–8.3)

## 2022-10-15 LAB — CBC WITH DIFFERENTIAL/PLATELET
Basophils Absolute: 0 10*3/uL (ref 0.0–0.1)
Basophils Relative: 0.7 % (ref 0.0–3.0)
Eosinophils Absolute: 0.2 10*3/uL (ref 0.0–0.7)
Eosinophils Relative: 4.2 % (ref 0.0–5.0)
HCT: 40.3 % (ref 36.0–46.0)
Hemoglobin: 13 g/dL (ref 12.0–15.0)
Lymphocytes Relative: 36.8 % (ref 12.0–46.0)
Lymphs Abs: 2 10*3/uL (ref 0.7–4.0)
MCHC: 32.3 g/dL (ref 30.0–36.0)
MCV: 95 fl (ref 78.0–100.0)
Monocytes Absolute: 0.3 10*3/uL (ref 0.1–1.0)
Monocytes Relative: 6 % (ref 3.0–12.0)
Neutro Abs: 2.8 10*3/uL (ref 1.4–7.7)
Neutrophils Relative %: 52.3 % (ref 43.0–77.0)
Platelets: 147 10*3/uL — ABNORMAL LOW (ref 150.0–400.0)
RBC: 4.24 Mil/uL (ref 3.87–5.11)
RDW: 14.6 % (ref 11.5–15.5)
WBC: 5.4 10*3/uL (ref 4.0–10.5)

## 2022-10-15 NOTE — Progress Notes (Signed)
Name: [name]  MRN/ DOB: [id number], March 03, 1955    Age/ Sex: 67 y.o., female     PCP: Lindaann Pascal, PA-C   Reason for Endocrinology Evaluation: Graves' Disease      Initial Endocrinology Clinic Visit: 02/16/2020    PATIENT IDENTIFIER: [name] is a 67 y.o., female with a past medical history of Hx of breast ca ( S/P right lumpectomy 2018 and radiation ) and Graves' disease . She has followed with [facility] since 02/16/2020 for consultative assistance with management of her hyperthyroidism.   HISTORICAL SUMMARY:   She has been diagnosed with Graves' disease in 2016. Had a thyroid uptake and scan on 08/13/2014 at 57.5 % of I-131 , uniform uptake consistent with Graves's disease. Has been on Methimazole since her diagnosis.    SUBJECTIVE:    Today (10/15/2022):  [name] is here for a follow up on Graves disease    She had a follow-up with cardiology for palpitations, was prescribed propranolol as needed  She continues to follow-up with oncology for history of breast cancer   She follows with vascular and vein specialist for venous insufficiency  Weight continues to fluctuate Denies local neck swelling   Denies palpitations  Denies diarrhea , has occasional constipation    HOME ENDOCRINE MEDICATION  Methimazole 5 mg daily ,3 tabs daily        HISTORY:  Past Medical History:  Past Medical History:  Diagnosis Date   ALLERGIC RHINITIS 03/30/2008   Qualifier: Diagnosis of  By: Jonny Ruiz MD, Len Blalock    Anxiety    Arthritis    neck   Breast cancer (HCC) 2018   Right Breast Cancer   Deep venous thrombosis (HCC) 01/26/2021   Dysarthria 09/22/2012   GERD (gastroesophageal reflux disease)    Graves' disease 09/03/2014   Headache(784.0) 09/22/2012   History of hiatal hernia    History of radiation therapy 02/05/2017   02/05/17-03/05/17,  right breast 40.05 Gy in 15 fractions, right breast boost 10 Gy in 5 fractions   Hyperlipidemia     Hypertension    Hyperthyroidism    Iron deficiency anemia    LEG PAIN, RIGHT 04/22/2008   Qualifier: Diagnosis of  By: Jonny Ruiz MD, Len Blalock    Low back pain 01/26/2021   Malignant tumor of breast (HCC) 11/13/2016   Palpitation 09/24/2013   Personal history of radiation therapy 2018   Right Breast Cancer   Pulmonary emboli (HCC) 11/20/2016   Uterine fibroid    Vitamin D deficiency    Graves disease   Past Surgical History:  Past Surgical History:  Procedure Laterality Date   BREAST LUMPECTOMY Right 12/16/2016   BREAST LUMPECTOMY WITH RADIOACTIVE SEED AND SENTINEL LYMPH NODE BIOPSY Right 12/16/2016   Procedure: RIGHT BREAST LUMPECTOMY WITH RADIOACTIVE SEED AND SENTINEL LYMPH NODE BIOPSY;  Surgeon: Emelia Loron, MD;  Location: [facility] OR;  Service: General;  Laterality: Right;   CHOLECYSTECTOMY     COLONOSCOPY     DILATION AND CURETTAGE OF UTERUS     01/2008   ESOPHAGOGASTRODUODENOSCOPY     ivc filter     Social History:  reports that she quit smoking about 20 years ago. Her smoking use included cigarettes. She started smoking about 30 years ago. She has a 4 pack-year smoking history. She has never used smokeless tobacco. She reports current alcohol use. She reports that she does not use drugs. Family History:  Family History  Problem Relation Age of Onset  Drug abuse Brother    Hypertension Mother    Colon cancer Maternal Grandfather      HOME MEDICATIONS: Allergies as of 10/15/2022       Reactions   Amoxicillin-pot Clavulanate Other (See Comments)   Dizziness   Niacin-lovastatin Er Other (See Comments)   Caused flushing        Medication List        Accurate as of October 15, 2022  1:19 PM. If you have any questions, ask your nurse or doctor.          anastrozole 1 MG tablet Commonly known as: ARIMIDEX Take 1 tablet (1 mg total) by mouth daily.   aspirin EC 81 MG tablet Take 1 tablet (81 mg total) by mouth daily.   atorvastatin 20 MG tablet Commonly  known as: LIPITOR Take 20 mg by mouth daily.   candesartan 8 MG tablet Commonly known as: Atacand Take 1 tablet (8 mg total) by mouth daily.   hydrALAZINE 10 MG tablet Commonly known as: APRESOLINE Take 0.5 tablets (5 mg total) by mouth as needed (for diastolic greater than 110 for up to 2 doses.).   LORazepam 0.5 MG tablet Commonly known as: ATIVAN Take 0.5 mg by mouth 2 (two) times daily as needed.   methimazole 5 MG tablet Commonly known as: TAPAZOLE Take 3 tablets (15 mg total) by mouth daily.   OMEGA-3 FISH OIL PO Take 2,000 mg by mouth.   vitamin C 1000 MG tablet Take 1,000 mg by mouth daily.   VITAMIN D-3 PO Take 1 capsule by mouth daily.          OBJECTIVE:   PHYSICAL EXAM: VS: BP 126/82 (BP Location: Left Arm, Patient Position: Sitting, Cuff Size: Large)   Pulse 87   Ht 5' 6.5" (1.689 m)   Wt 179 lb 6.4 oz (81.4 kg)   LMP 06/18/2010   SpO2 97%   BMI 28.52 kg/m    EXAM: General: Pt appears well and is in NAD  Neck: General: Supple without adenopathy. Thyroid: Thyroid asymmetry noted on exam today. No thyroid bruit.  Lungs: Clear with good BS bilat   Heart: Auscultation: RRR.  Abdomen: soft, nontender  Mental Status: Judgment, insight: Intact Orientation: Oriented to time, place, and person Mood and affect: No depression, anxiety, or agitation     DATA REVIEWED:    Latest Reference Range & Units 10/15/22 13:05  Sodium 135 - 145 mEq/L 141  Potassium 3.5 - 5.1 mEq/L 4.0  Chloride 96 - 112 mEq/L 105  CO2 19 - 32 mEq/L 26  Glucose 70 - 99 mg/dL 098 (H)  BUN 6 - 23 mg/dL 13  Creatinine 1.19 - 1.47 mg/dL 8.29  Calcium 8.4 - 56.2 mg/dL 9.6  Alkaline Phosphatase 39 - 117 U/L 310 (H)  Albumin 3.5 - 5.2 g/dL 4.2  AST 0 - 37 U/L 16  ALT 0 - 35 U/L 16  Total Protein 6.0 - 8.3 g/dL 8.0  Total Bilirubin 0.2 - 1.2 mg/dL 0.5  GFR >13.08 mL/min 66.50     Latest Reference Range & Units 10/15/22 13:05  WBC 4.0 - 10.5 K/uL 5.4  RBC 3.87 - 5.11  Mil/uL 4.24  Hemoglobin 12.0 - 15.0 g/dL 65.7  HCT 84.6 - 96.2 % 40.3  MCV 78.0 - 100.0 fl 95.0  MCHC 30.0 - 36.0 g/dL 95.2  RDW 84.1 - 32.4 % 14.6  Platelets 150.0 - 400.0 K/uL 147.0 (L)  Neutrophils 43.0 - 77.0 % 52.3  Lymphocytes 12.0 -  46.0 % 36.8  Monocytes Relative 3.0 - 12.0 % 6.0  Eosinophil 0.0 - 5.0 % 4.2  Basophil 0.0 - 3.0 % 0.7  NEUT# 1.4 - 7.7 K/uL 2.8  Lymphocyte # 0.7 - 4.0 K/uL 2.0  Monocyte # 0.1 - 1.0 K/uL 0.3  Eosinophils Absolute 0.0 - 0.7 K/uL 0.2  Basophils Absolute 0.0 - 0.1 K/uL 0.0     Latest Reference Range & Units 10/15/22 13:05  TSH 0.35 - 5.50 uIU/mL 7.19 (H)  Triiodothyronine,Free,Serum 2.3 - 4.2 pg/mL 3.0  T4,Free(Direct) 0.60 - 1.60 ng/dL 3.32 (L)  (H): Data is abnormally high (L): Data is abnormally low       Thyroid ultrasound 05/02/2022  Estimated total number of nodules >/= 1 cm: 1   Number of spongiform nodules >/=  2 cm not described below (TR1): 0   Number of mixed cystic and solid nodules >/= 1.5 cm not described below (TR2): 0   _________________________________________________________   Nodule # 1:   Location: Right; Inferior   Maximum size: 1.6 cm; Other 2 dimensions: 1.4 x 1.3 cm   Composition: solid/almost completely solid (2)   Echogenicity: isoechoic (1)   Shape: not taller-than-wide (0)   Margins: ill-defined (0)   Echogenic foci: none (0)   ACR TI-RADS total points: 3.   ACR TI-RADS risk category: TR3 (3 points).   ACR TI-RADS recommendations:   *Given size (>/= 1.5 - 2.4 cm) and appearance, a follow-up ultrasound in 1 year should be considered based on TI-RADS criteria.   _________________________________________________________   IMPRESSION: 1. Diffusely enlarged and heterogeneous thyroid gland most consistent with chronic thyroiditis or diffuse goitrous change. 2. A solitary 1.6 cm TI-RADS category 3 nodule is visualized in the right lower gland. This nodule meets criteria for  imaging surveillance. Recommend follow-up ultrasound in 1 year.    ASSESSMENT / PLAN / RECOMMENDATIONS:    Hyperthyroidism secondary to Graves' Disease:   - No local neck symptoms -Patient is clinically euthyroid - Has been on methimazole since 2016,  -We again discussed options for long-term alternative therapy to include total thyroidectomy versus RAI , we discussed the need for lifelong LT-for replacement - Given her Hx of breast Ca. ( S/p radiation but no chemo) I would hold off on RAI and prepare thyroidectomy as an alternative therapy to thionamides therapy -She has been noted with neutropenia in the past, I did explain to the patient that methimazole could cause agranulocytosis which increases the risk of fatal infections, CBC is normal -TSH is elevated with low free T4, will decrease methimazole as below    Medications : Decrease methimazole 5 mg, 1.5 tablet  daily     2. Graves' Disease:     No extra-thyroidal manifestations of graves' disease  3.  Thyromegaly:  -Thyroid ultrasound 04/2022 revealed 1.6 cm right thyroid nodule met criteria for surveillance -Will repeat next year   4.  Elevated alkaline phosphatase:  -Historically this has been fluctuating, but I do believe the recent increase is due to methimazole -We will decrease methimazole by 50% and recheck alkaline phosphatase in 2 months  Follow-up in 6 months Labs in 2 months     Signed electronically by: Lyndle Herrlich, MD  [facility]  [facility] [address] [place], [place] [address] Phone: [phone] FAX: [phone]      CC: Mike Craze [address] [place] [address] Phone: [phone]  Fax: [phone]   Return to Endocrinology clinic as below:  Future Appointments  Date Time Provider Department Center  11/25/2022 11:00 AM [facility] DIAG TOMO 1 [facility]-BRE[facility] CE  12/05/2022 11:15 AM [facility] LAB [facility]  None  12/05/2022 11:45 AM Iruku, Burnice Logan, MD [facility] None  12/16/2022 11:45 AM [facility]UR[facility] LAB [facility] None  03/04/2023  1:00 PM [facility] Cormier, Konrad Dolores, MD [facility] None

## 2022-10-16 ENCOUNTER — Telehealth: Payer: Self-pay | Admitting: Internal Medicine

## 2022-10-16 LAB — T4, FREE: Free T4: 0.52 ng/dL — ABNORMAL LOW (ref 0.60–1.60)

## 2022-10-16 LAB — T3, FREE: T3, Free: 3 pg/mL (ref 2.3–4.2)

## 2022-10-16 LAB — TSH: TSH: 7.19 u[IU]/mL — ABNORMAL HIGH (ref 0.35–5.50)

## 2022-10-16 MED ORDER — METHIMAZOLE 5 MG PO TABS: 7.5000 mg | ORAL_TABLET | Freq: Every day | ORAL | 2 refills | Status: DC

## 2022-10-16 NOTE — Telephone Encounter (Signed)
Please let the patient know that her thyroid levels show that the current dose of methimazole is too much for her and its time to decrease it to 1.5 tablets daily of methimazole    One of her liver enzymes is elevated again, this has been elevated in the past and continues to fluctuate.  The most likely reason is the methimazole causing this elevation, I will recheck it in 2 months when we check her thyroid test   Thanks

## 2022-10-16 NOTE — Telephone Encounter (Signed)
Patient aware and will make medication adjustment

## 2022-10-21 ENCOUNTER — Telehealth: Payer: Self-pay | Admitting: Cardiology

## 2022-10-21 NOTE — Telephone Encounter (Signed)
Patient states she has been having discomfort under her left breast area and in her stomach. She denies any SOB, edema, headache, nausea or vomiting. She states its off and on. Currently she is not experiencing any pain. She would like an EKG. Did advise to reach out to her PCP ad well

## 2022-10-21 NOTE — Telephone Encounter (Signed)
   Pt c/o of Chest Pain: STAT if active CP, including tightness, pressure, jaw pain, radiating pain to shoulder/upper arm/back, CP unrelieved by Nitro. Symptoms reported of SOB, nausea, vomiting, sweating.  1. Are you having CP right now? Yes, but no other side effect.   2. Are you experiencing any other symptoms (ex. SOB, nausea, vomiting, sweating)? No   3. Is your CP continuous or coming and going? Coming and Going   4. Have you taken Nitroglycerin? No, took acid reflex medication.    5. How long have you been experiencing CP? Started 09/02   6. If NO CP at time of call then end call with telling Pt to call back or call 911 if Chest pain returns prior to return call from triage team.   Patient is calling because she has been having chest/stomach pain. Patient is unsure if it could be chest pain or indigestion. Please advise.

## 2022-10-22 NOTE — Telephone Encounter (Signed)
Spoke with patient and scheduled appointment with APP

## 2022-10-28 NOTE — Progress Notes (Unsigned)
Cardiology Clinic Note   Patient Name: [name] Date of Encounter: 10/30/2022  Primary Care Provider:  Lindaann Pascal, PA-C Primary Cardiologist:  Rollene Rotunda, MD  Patient Profile    [name] 67 year old female presents to the clinic today for follow-up evaluation of her palpitations and chest pain.  Past Medical History    Past Medical History:  Diagnosis Date   ALLERGIC RHINITIS 03/30/2008   Qualifier: Diagnosis of  By: Jonny Ruiz MD, Len Blalock    Anxiety    Arthritis    neck   Breast cancer (HCC) 2018   Right Breast Cancer   Deep venous thrombosis (HCC) 01/26/2021   Dysarthria 09/22/2012   GERD (gastroesophageal reflux disease)    Graves' disease 09/03/2014   Headache(784.0) 09/22/2012   History of hiatal hernia    History of radiation therapy 02/05/2017   02/05/17-03/05/17,  right breast 40.05 Gy in 15 fractions, right breast boost 10 Gy in 5 fractions   Hyperlipidemia    Hypertension    Hyperthyroidism    Iron deficiency anemia    LEG PAIN, RIGHT 04/22/2008   Qualifier: Diagnosis of  By: Jonny Ruiz MD, Len Blalock    Low back pain 01/26/2021   Malignant tumor of breast (HCC) 11/13/2016   Palpitation 09/24/2013   Personal history of radiation therapy 2018   Right Breast Cancer   Pulmonary emboli (HCC) 11/20/2016   Uterine fibroid    Vitamin D deficiency    Graves disease   Past Surgical History:  Procedure Laterality Date   BREAST LUMPECTOMY Right 12/16/2016   BREAST LUMPECTOMY WITH RADIOACTIVE SEED AND SENTINEL LYMPH NODE BIOPSY Right 12/16/2016   Procedure: RIGHT BREAST LUMPECTOMY WITH RADIOACTIVE SEED AND SENTINEL LYMPH NODE BIOPSY;  Surgeon: Emelia Loron, MD;  Location: [facility] OR;  Service: General;  Laterality: Right;   CHOLECYSTECTOMY     COLONOSCOPY     DILATION AND CURETTAGE OF UTERUS     01/2008   ESOPHAGOGASTRODUODENOSCOPY     ivc filter      Allergies  Allergies  Allergen Reactions   Amoxicillin-Pot Clavulanate Other (See Comments)     Dizziness    Niacin-Lovastatin Er Other (See Comments)    Caused flushing     History of Present Illness    [name] has a PMH of palpitations, precordial chest pain, bruit, DVT/PE and IVC filter.  She underwent treadmill stress testing in [place] several years ago.  She underwent treadmill stress testing/POET with [facility] which was negative.  She was prescribed propranolol as needed for palpitations.  She was seen in follow-up by Dr. Antoine Poche on 12/27/2021.  During that time she reported that she had not had to take any propranolol.  She denied feeling palpitations.  She denied presyncope and syncope.  She was not having any new shortness of breath PND orthopnea.  She denied chest pain.  She had been given home sleep test and had not yet performed it.  Follow-up in 1 year was planned.  She presents to the clinic today for follow-up evaluation and states recently she has been traveling more.  She is retired.  Recently she has been more active lifting while she has been out of town.  She is also increased the dairy in her diet.  She noticed some chest discomfort and presented for evaluation.  Her blood pressure in the clinic today is 136/82.  On exam it appears that her chest discomfort was related to acid reflux and musculoskeletal pain.  This was  reviewed.  She also reported some dizziness last week.  This has resolved as she has resumed her normal routine.  She has been seen by GI and her PCP for management of her thyroid.  She is stable from a cardiac standpoint.  We will plan follow-up in 12 months.  Today she denies shortness of breath, lower extremity edema, fatigue, palpitations, melena, hematuria, hemoptysis, diaphoresis, weakness, presyncope, syncope, orthopnea, and PND.    Home Medications    Prior to Admission medications   Medication Sig Start Date End Date Taking? Authorizing Provider  anastrozole (ARIMIDEX) 1 MG tablet Take 1 tablet (1 mg total) by mouth daily. 04/17/22    Rachel Moulds, MD  Ascorbic Acid (VITAMIN C) 1000 MG tablet Take 1,000 mg by mouth daily.    [provider]  aspirin EC 81 MG tablet Take 1 tablet (81 mg total) by mouth daily. 10/07/17   Rollene Rotunda, MD  atorvastatin (LIPITOR) 20 MG tablet Take 20 mg by mouth daily. 03/10/21   [provider]  candesartan (ATACAND) 8 MG tablet Take 1 tablet (8 mg total) by mouth daily. 12/18/20   Rollene Rotunda, MD  Cholecalciferol (VITAMIN D-3 PO) Take 1 capsule by mouth daily.    [provider]  hydrALAZINE (APRESOLINE) 10 MG tablet Take 0.5 tablets (5 mg total) by mouth as needed (for diastolic greater than 110 for up to 2 doses.). 12/28/20   Rollene Rotunda, MD  LORazepam (ATIVAN) 0.5 MG tablet Take 0.5 mg by mouth 2 (two) times daily as needed. 12/29/20   [provider]  methimazole (TAPAZOLE) 5 MG tablet Take 1.5 tablets (7.5 mg total) by mouth daily. 10/16/22   Shamleffer, Konrad Dolores, MD  Omega-3 Fatty Acids (OMEGA-3 FISH OIL PO) Take 2,000 mg by mouth.    [provider]    Family History    Family History  Problem Relation Age of Onset   Drug abuse Brother    Hypertension Mother    Colon cancer Maternal Grandfather    She indicated that her mother is alive. She indicated that her father is deceased. She indicated that her brother is deceased. She indicated that her maternal grandmother is deceased. She indicated that her maternal grandfather is deceased. She indicated that her paternal grandmother is deceased. She indicated that her paternal grandfather is deceased.  Social History    Social History   Socioeconomic History   Marital status: Single    Spouse name: Not on file   Number of children: Not on file   Years of education: college   Highest education level: Not on file  Occupational History   Occupation: TAX SPECIALIST    Employer: [employer]  Tobacco Use   Smoking status: Former    Current packs/day: 0.00    Average  packs/day: 0.4 packs/day for 10.0 years (4.0 ttl pk-yrs)    Types: Cigarettes    Start date: 05/31/1992    Quit date: 06/01/2002    Years since quitting: 20.4   Smokeless tobacco: Never  Vaping Use   Vaping status: Never Used  Substance and Sexual Activity   Alcohol use: Yes    Comment: occasional 1 drink per month   Drug use: No   Sexual activity: Not on file  Other Topics Concern   Not on file  Social History Narrative   Lives alone.    Social Determinants of Health   Financial Resource Strain: Not on file  Food Insecurity: Not on file  Transportation Needs: Not  on file  Physical Activity: Not on file  Stress: Not on file  Social Connections: Unknown (06/25/2021)   Received from [facility], [facility]   Social Network    Social Network: Not on file  Intimate Partner Violence: Unknown (05/17/2021)   [facility], [facility]   HITS    Physically Hurt: Not on file    Insult or Talk Down To: Not on file    Threaten Physical Harm: Not on file    Scream or Curse: Not on file     Review of Systems    General:  No chills, fever, night sweats or weight changes.  Cardiovascular:  No chest pain, dyspnea on exertion, edema, orthopnea, palpitations, paroxysmal nocturnal dyspnea. Dermatological: No rash, lesions/masses Respiratory: No cough, dyspnea Urologic: No hematuria, dysuria Abdominal:   No nausea, vomiting, diarrhea, bright red blood per rectum, melena, or hematemesis Neurologic:  No visual changes, wkns, changes in mental status. All other systems reviewed and are otherwise negative except as noted above.  Physical Exam    VS:  BP 136/82   Pulse 60   Ht 5' 6.5" (1.689 m)   Wt 180 lb (81.6 kg)   LMP 06/18/2010   SpO2 99%   BMI 28.62 kg/m  , BMI Body mass index is 28.62 kg/m. GEN: Well nourished, well developed, in no acute distress. HEENT: normal. Neck: Supple, no JVD, carotid bruits, or masses. Cardiac: RRR, no murmurs, rubs, or gallops.  No clubbing, cyanosis, edema.  Radials/DP/PT 2+ and equal bilaterally.  Respiratory:  Respirations regular and unlabored, clear to auscultation bilaterally. GI: Soft, nontender, nondistended, BS + x 4. MS: no deformity or atrophy. Skin: warm and dry, no rash. Neuro:  Strength and sensation are intact. Psych: Normal affect.  Accessory Clinical Findings    Recent Labs: 10/15/2022: ALT 16; BUN 13; Creatinine, Ser 0.91; Hemoglobin 13.0; Platelets 147.0; Potassium 4.0; Sodium 141; TSH 7.19   Recent Lipid Panel    Component Value Date/Time   CHOL 153 01/26/2021 1025   TRIG 103 01/26/2021 1025   HDL 49 01/26/2021 1025   CHOLHDL 3.1 01/26/2021 1025   CHOLHDL 4.4 04/06/2010 1037   VLDL 16 04/06/2010 1037   LDLCALC 85 01/26/2021 1025         ECG personally reviewed by me today- EKG Interpretation Date/Time:  Wednesday October 30 2022 14:57:14 EDT Ventricular Rate:  60 PR Interval:  140 QRS Duration:  86 QT Interval:  382 QTC Calculation: 382 R Axis:   7  Text Interpretation: Normal sinus rhythm Nonspecific T wave abnormality When compared with ECG of 09-Aug-2020 15:52, No significant change was found Confirmed by Edd Fabian [phone]) on 10/30/2022 3:11:37 PM      ETT 10/09/2021     No ST deviation was noted.   Low risk ETT, negative for ischemia   Stress Findings  Resting ECG ECG is normal.  Stress Findings A Bruce protocol EXERCISE TOLERANCE TEST was performed. Patient exercised for 7 min and 27 sec. Maximum HR of 139 bpm. MPHR 88.0 %. Peak METS 9.2 . Exercise capacity is good The patient experienced no angina during the test. The test was stopped because the patient experienced fatigue. The patient reported no symptoms during the stress test.  Stress ECG No ST deviation was noted.     Assessment & Plan   1.  Chest discomfort-no chest pain today.  Denies exertional chest pain.  Underwent treadmill stress test which was negative.  Had been more physically active  lifting  and had increased the dairy in her diet.  Symptoms have resolved. Reassured that her symptoms were not related to cardiac issues Heart healthy low-sodium diet No plans for ischemic evaluation  Palpitations-heart rate today 60 bpm.  Previously prescribed propranolol as needed for palpitations.  EKG reassuring. Continue as needed propranolol Avoid triggers caffeine, chocolate, EtOH, dehydration etc.  Essential hypertension-BP today 136/82. Maintain blood pressure log Heart healthy low-sodium diet Increase physical activity as tolerated  Carotid bruit-denies lightheadedness, presyncope or syncope.  Underwent carotid Dopplers which showed no stenosis.  Disposition: Follow-up with Dr. Antoine Poche or me in 12 months.   Thomasene Ripple. Jamine Highfill NP-C     10/30/2022, 3:11 PM [facility] [address] Office [phone] Fax [phone]    I spent 14 minutes examining this patient, reviewing medications, and using patient centered shared decision making involving her cardiac care.  Prior to her visit I spent greater than 20 minutes reviewing her past medical history,  medications, and prior cardiac tests.

## 2022-10-29 DIAGNOSIS — K219 Gastro-esophageal reflux disease without esophagitis: Secondary | ICD-10-CM | POA: Diagnosis not present

## 2022-10-29 DIAGNOSIS — E669 Obesity, unspecified: Secondary | ICD-10-CM | POA: Diagnosis not present

## 2022-10-29 DIAGNOSIS — R748 Abnormal levels of other serum enzymes: Secondary | ICD-10-CM | POA: Diagnosis not present

## 2022-10-30 ENCOUNTER — Ambulatory Visit: Payer: Federal, State, Local not specified - PPO | Attending: General Practice | Admitting: General Practice

## 2022-10-30 ENCOUNTER — Encounter: Payer: Self-pay | Admitting: General Practice

## 2022-10-30 VITALS — BP 136/82 | HR 60 | Ht 66.5 in | Wt 180.0 lb

## 2022-10-30 DIAGNOSIS — R072 Precordial pain: Secondary | ICD-10-CM

## 2022-10-30 DIAGNOSIS — R0989 Other specified symptoms and signs involving the circulatory and respiratory systems: Secondary | ICD-10-CM

## 2022-10-30 DIAGNOSIS — R002 Palpitations: Secondary | ICD-10-CM

## 2022-10-30 DIAGNOSIS — R079 Chest pain, unspecified: Secondary | ICD-10-CM

## 2022-10-30 DIAGNOSIS — I10 Essential (primary) hypertension: Secondary | ICD-10-CM

## 2022-10-30 NOTE — Patient Instructions (Signed)
Medication Instructions:  The current medical regimen is effective;  continue present plan and medications as directed. Please refer to the Current Medication list given to you today.  *If you need a refill on your cardiac medications before your next appointment, please call your pharmacy*  Lab Work: NONE If you have labs (blood work) drawn today and your tests are completely normal, you will receive your results only by:  MyChart Message (if you have MyChart) OR  A paper copy in the mail If you have any lab test that is abnormal or we need to change your treatment, we will call you to review the results.  Other Instructions FOLLOW ATTACHED HEART HEALTHY/LOW SODIUM DIET  INCREASE PHYSICAL ACTIVITY, GOAL: 150 MODERATED PHYSICAL ACTIVITY  Follow-Up: At [facility], you and your health needs are our priority.  As part of our continuing mission to provide you with exceptional heart care, we have created designated Provider Care Teams.  These Care Teams include your primary Cardiologist (physician) and Advanced Practice Providers (APPs -  Physician Assistants and Nurse Practitioners) who all work together to provide you with the care you need, when you need it.  Your next appointment:   12 month(s)  Provider:   Rollene Rotunda, MD      Heart-Healthy Eating Plan Eating a healthy diet is important for the health of your heart. A heart-healthy eating plan includes: Eating less unhealthy fats. Eating more healthy fats. Eating less salt in your food. Salt is also called sodium. Making other changes in your diet. Talk with your doctor or a diet specialist (dietitian) to create an eating plan that is right for you. What is my plan? Your doctor may recommend an eating plan that includes: Total fat: ______% or less of total calories a day. Saturated fat: ______% or less of total calories a day. Cholesterol: less than _________mg a day. Sodium: less than _________mg a day. What are  tips for following this plan? Cooking Avoid frying your food. Try to bake, boil, grill, or broil it instead. You can also reduce fat by: Removing the skin from poultry. Removing all visible fats from meats. Steaming vegetables in water or broth. Meal planning  At meals, divide your plate into four equal parts: Fill one-half of your plate with vegetables and green salads. Fill one-fourth of your plate with whole grains. Fill one-fourth of your plate with lean protein foods. Eat 2-4 cups of vegetables per day. One cup of vegetables is: 1 cup (91 g) broccoli or cauliflower florets. 2 medium carrots. 1 large bell pepper. 1 large sweet potato. 1 large tomato. 1 medium white potato. 2 cups (150 g) raw leafy greens. Eat 1-2 cups of fruit per day. One cup of fruit is: 1 small apple 1 large banana 1 cup (237 g) mixed fruit, 1 large orange,  cup (82 g) dried fruit, 1 cup (240 mL) 100% fruit juice. Eat more foods that have soluble fiber. These are apples, broccoli, carrots, beans, peas, and barley. Try to get 20-30 g of fiber per day. Eat 4-5 servings of nuts, legumes, and seeds per week: 1 serving of dried beans or legumes equals  cup (90 g) cooked. 1 serving of nuts is  oz (12 almonds, 24 pistachios, or 7 walnut halves). 1 serving of seeds equals  oz (8 g). General information Eat more home-cooked food. Eat less restaurant, buffet, and fast food. Limit or avoid alcohol. Limit foods that are high in starch and sugar. Avoid fried foods. Lose  weight if you are overweight. Keep track of how much salt (sodium) you eat. This is important if you have high blood pressure. Ask your doctor to tell you more about this. Try to add vegetarian meals each week. Fats Choose healthy fats. These include olive oil and canola oil, flaxseeds, walnuts, almonds, and seeds. Eat more omega-3 fats. These include salmon, mackerel, sardines, tuna, flaxseed oil, and ground flaxseeds. Try to eat fish at  least 2 times each week. Check food labels. Avoid foods with trans fats or high amounts of saturated fat. Limit saturated fats. These are often found in animal products, such as meats, butter, and cream. These are also found in plant foods, such as palm oil, palm kernel oil, and coconut oil. Avoid foods with partially hydrogenated oils in them. These have trans fats. Examples are stick margarine, some tub margarines, cookies, crackers, and other baked goods. What foods should I eat? Fruits All fresh, canned (in natural juice), or frozen fruits. Vegetables Fresh or frozen vegetables (raw, steamed, roasted, or grilled). Green salads. Grains Most grains. Choose whole wheat and whole grains most of the time. Rice and pasta, including brown rice and pastas made with whole wheat. Meats and other proteins Lean, well-trimmed beef, veal, pork, and lamb. Chicken and [place] without skin. All fish and shellfish. Wild duck, rabbit, pheasant, and venison. Egg whites or low-cholesterol egg substitutes. Dried beans, peas, lentils, and tofu. Seeds and most nuts. Dairy Low-fat or nonfat cheeses, including ricotta and mozzarella. Skim or 1% milk that is liquid, powdered, or evaporated. Buttermilk that is made with low-fat milk. Nonfat or low-fat yogurt. Fats and oils Non-hydrogenated (trans-free) margarines. Vegetable oils, including soybean, sesame, sunflower, olive, peanut, safflower, corn, canola, and cottonseed. Salad dressings or mayonnaise made with a vegetable oil. Beverages Mineral water. Coffee and tea. Diet carbonated beverages. Sweets and desserts Sherbet, gelatin, and fruit ice. Small amounts of dark chocolate. Limit all sweets and desserts. Seasonings and condiments All seasonings and condiments. The items listed above may not be a complete list of foods and drinks you can eat. Contact a dietitian for more options. What foods should I avoid? Fruits Canned fruit in heavy syrup. Fruit in cream  or butter sauce. Fried fruit. Limit coconut. Vegetables Vegetables cooked in cheese, cream, or butter sauce. Fried vegetables. Grains Breads that are made with saturated or trans fats, oils, or whole milk. Croissants. Sweet rolls. Donuts. High-fat crackers, such as cheese crackers. Meats and other proteins Fatty meats, such as hot dogs, ribs, sausage, bacon, rib-eye roast or steak. High-fat deli meats, such as salami and bologna. Caviar. Domestic duck and goose. Organ meats, such as liver. Dairy Cream, sour cream, cream cheese, and creamed cottage cheese. Whole-milk cheeses. Whole or 2% milk that is liquid, evaporated, or condensed. Whole buttermilk. Cream sauce or high-fat cheese sauce. Yogurt that is made from whole milk. Fats and oils Meat fat, or shortening. Cocoa butter, hydrogenated oils, palm oil, coconut oil, palm kernel oil. Solid fats and shortenings, including bacon fat, salt pork, lard, and butter. Nondairy cream substitutes. Salad dressings with cheese or sour cream. Beverages Regular sodas and juice drinks with added sugar. Sweets and desserts Frosting. Pudding. Cookies. Cakes. Pies. Milk chocolate or white chocolate. Buttered syrups. Full-fat ice cream or ice cream drinks. The items listed above may not be a complete list of foods and drinks to avoid. Contact a dietitian for more information. Summary Heart-healthy meal planning includes eating less unhealthy fats, eating more healthy fats, and making other  changes in your diet. Eat a balanced diet. This includes fruits and vegetables, low-fat or nonfat dairy, lean protein, nuts and legumes, whole grains, and heart-healthy oils and fats. This information is not intended to replace advice given to you by your health care provider. Make sure you discuss any questions you have with your health care provider. Document Revised: 03/05/2021 Document Reviewed: 03/05/2021 Elsevier Patient Education  2024 [employer].

## 2022-11-01 DIAGNOSIS — J0111 Acute recurrent frontal sinusitis: Secondary | ICD-10-CM | POA: Diagnosis not present

## 2022-11-25 ENCOUNTER — Ambulatory Visit
Admission: RE | Admit: 2022-11-25 | Discharge: 2022-11-25 | Disposition: A | Discharge status: Home / Self Care | Payer: Federal, State, Local not specified - PPO | Source: Ambulatory Visit | Attending: Hematology and Oncology

## 2022-11-25 DIAGNOSIS — C50511 Malignant neoplasm of lower-outer quadrant of right female breast: Secondary | ICD-10-CM

## 2022-11-25 DIAGNOSIS — Z853 Personal history of malignant neoplasm of breast: Secondary | ICD-10-CM | POA: Diagnosis not present

## 2022-12-03 ENCOUNTER — Other Ambulatory Visit: Payer: Self-pay | Admitting: *Deleted

## 2022-12-03 DIAGNOSIS — C50511 Malignant neoplasm of lower-outer quadrant of right female breast: Secondary | ICD-10-CM

## 2022-12-05 ENCOUNTER — Inpatient Hospital Stay
Payer: Federal, State, Local not specified - PPO | Attending: Hematology and Oncology | Admitting: Hematology and Oncology

## 2022-12-05 ENCOUNTER — Inpatient Hospital Stay: Payer: Federal, State, Local not specified - PPO

## 2022-12-05 VITALS — BP 125/71 | HR 67 | Temp 97.3°F | Resp 17 | Wt 184.3 lb

## 2022-12-05 DIAGNOSIS — Z170 Estrogen receptor positive status [ER+]: Secondary | ICD-10-CM | POA: Diagnosis not present

## 2022-12-05 DIAGNOSIS — Z79811 Long term (current) use of aromatase inhibitors: Secondary | ICD-10-CM | POA: Diagnosis not present

## 2022-12-05 DIAGNOSIS — C50511 Malignant neoplasm of lower-outer quadrant of right female breast: Secondary | ICD-10-CM | POA: Insufficient documentation

## 2022-12-05 LAB — CBC WITH DIFFERENTIAL (CANCER CENTER ONLY)
Abs Immature Granulocytes: 0.02 10*3/uL (ref 0.00–0.07)
Basophils Absolute: 0 10*3/uL (ref 0.0–0.1)
Basophils Relative: 1 %
Eosinophils Absolute: 0.2 10*3/uL (ref 0.0–0.5)
Eosinophils Relative: 4 %
HCT: 38.1 % (ref 36.0–46.0)
Hemoglobin: 12.9 g/dL (ref 12.0–15.0)
Immature Granulocytes: 0 %
Lymphocytes Relative: 33 %
Lymphs Abs: 2 10*3/uL (ref 0.7–4.0)
MCH: 32.7 pg (ref 26.0–34.0)
MCHC: 33.9 g/dL (ref 30.0–36.0)
MCV: 96.7 fL (ref 80.0–100.0)
Monocytes Absolute: 0.5 10*3/uL (ref 0.1–1.0)
Monocytes Relative: 8 %
Neutro Abs: 3.3 10*3/uL (ref 1.7–7.7)
Neutrophils Relative %: 54 %
Platelet Count: 150 10*3/uL (ref 150–400)
RBC: 3.94 MIL/uL (ref 3.87–5.11)
RDW: 12.9 % (ref 11.5–15.5)
WBC Count: 6 10*3/uL (ref 4.0–10.5)
nRBC: 0 % (ref 0.0–0.2)

## 2022-12-05 LAB — CMP (CANCER CENTER ONLY)
ALT: 18 U/L (ref 0–44)
AST: 18 U/L (ref 15–41)
Albumin: 4 g/dL (ref 3.5–5.0)
Alkaline Phosphatase: 221 U/L — ABNORMAL HIGH (ref 38–126)
Anion gap: 7 (ref 5–15)
BUN: 17 mg/dL (ref 8–23)
CO2: 26 mmol/L (ref 22–32)
Calcium: 9.2 mg/dL (ref 8.9–10.3)
Chloride: 103 mmol/L (ref 98–111)
Creatinine: 0.94 mg/dL (ref 0.44–1.00)
GFR, Estimated: >60 mL/min (ref >60)
Glucose, Bld: 103 mg/dL — ABNORMAL HIGH (ref 70–99)
Potassium: 3.9 mmol/L (ref 3.5–5.1)
Sodium: 136 mmol/L (ref 135–145)
Total Bilirubin: 0.7 mg/dL (ref 0.3–1.2)
Total Protein: 8.2 g/dL — ABNORMAL HIGH (ref 6.5–8.1)

## 2022-12-05 NOTE — Progress Notes (Signed)
[facility]  Telephone:([phone]) [phone] [phone]     ID: [name] DOB: [birth year]  MR#: [id number]  MVH#:[id number]  Patient Care Team: Lindaann Pascal, PA-C as PCP - General (Physician Assistant) Rollene Rotunda, MD as PCP - Cardiology (Cardiology) Emelia Loron, MD as Consulting Physician (General Surgery) Magrinat, Valentino Hue, MD (Inactive) as Consulting Physician (Oncology) Antony Blackbird, MD as Consulting Physician (Radiation Oncology) Carrington Clamp, MD as Consulting Physician (Obstetrics and Gynecology)   CHIEF COMPLAINT: Estrogen receptor positive breast cancer  CURRENT TREATMENT: anastrozole   INTERVAL HISTORY:  [name] returns today for follow-up of her estrogen receptor positive breast cancer.  She continues on anastrozole.  She has been tolerating this very well.  No hot flashes/vaginal dryness or arthralgias. Last mammogram in October 2024 with no mammographic evidence of malignancy.   Most recent bone density showed osteopenia.  T score of -1.3.  Since her last visit here, she has been doing well overall.  She however tells me that she had a bad mammogram experience, the mammogram tech was very rough and she had reported to Dr. Dimple Casey when she saw her.  Other than this bad mammogram experience, she is trying to return back to as needed work, she is interviewing with the [employer].  She has not noticed any changes in her breast, the right breast continues to be tender from time to time.  She is not exercising regularly but she is very mobile overall.  She has been taking her calcium and vitamin D supplements. Rest of the pertinent 10 point ROS reviewed and negative    HISTORY OF CURRENT ILLNESS: From the original intake note:  [name] had routine bilateral screening mammography with tomography at the [facility] 10/08/2016. An area of possible distortion in the right breast was noted. She was recalled for right diagnostic  mammography with tomography on 11/08/2016. This found the breast density to be category C. The small area of architectural distortion in the lateral right breast persisted and on 11/11/2016 biopsy of this area showed ([id number]) invasive ductal carcinoma, E-cadherin positive, estrogen receptor 95% positive with strong staining intensity, progesterone receptor 5% positive, with strong staining intensity, with an MIB-1 of 10%, and no HER-2 amplification, with a signals ratio of 1.51 and number per cell 3.40. Right axillary ultrasound 11/13/2016 was sonographically benign.  Of note, she has a history of DVT diagnosed March 2010, felt to be possibly related to estrogen replacement therapy. She was coumadinized but developed a pulmonary embolus while on Coumadin. She was then had an IVC filter placed and was anticoagulated with Lovenox for 2 years. An extensive hypercoagulable workup was negative except for an elevated factor VIII level. Her IVC filter is still in place and the patient tells me she is participating in a lawsuit regarding that.   The patient's subsequent history is as detailed below.   PAST MEDICAL HISTORY: Past Medical History:  Diagnosis Date   ALLERGIC RHINITIS 03/30/2008   Qualifier: Diagnosis of  By: Jonny Ruiz MD, Len Blalock    Anxiety    Arthritis    neck   Breast cancer (HCC) 2018   Right Breast Cancer   Deep venous thrombosis (HCC) 01/26/2021   Dysarthria 09/22/2012   GERD (gastroesophageal reflux disease)    Graves' disease 09/03/2014   Headache(784.0) 09/22/2012   History of hiatal hernia    History of radiation therapy 02/05/2017   02/05/17-03/05/17,  right breast 40.05 Gy in 15 fractions, right breast boost 10 Gy in  5 fractions   Hyperlipidemia    Hypertension    Hyperthyroidism    Iron deficiency anemia    LEG PAIN, RIGHT 04/22/2008   Qualifier: Diagnosis of  By: Jonny Ruiz MD, Len Blalock    Low back pain 01/26/2021   Malignant tumor of breast (HCC) 11/13/2016   Palpitation  09/24/2013   Personal history of radiation therapy 2018   Right Breast Cancer   Pulmonary emboli (HCC) 11/20/2016   Uterine fibroid    Vitamin D deficiency    Graves disease    PAST SURGICAL HISTORY: Past Surgical History:  Procedure Laterality Date   BREAST LUMPECTOMY Right 12/16/2016   BREAST LUMPECTOMY WITH RADIOACTIVE SEED AND SENTINEL LYMPH NODE BIOPSY Right 12/16/2016   Procedure: RIGHT BREAST LUMPECTOMY WITH RADIOACTIVE SEED AND SENTINEL LYMPH NODE BIOPSY;  Surgeon: Emelia Loron, MD;  Location: [facility] OR;  Service: General;  Laterality: Right;   CHOLECYSTECTOMY     COLONOSCOPY     DILATION AND CURETTAGE OF UTERUS     01/2008   ESOPHAGOGASTRODUODENOSCOPY     ivc filter      FAMILY HISTORY Family History  Problem Relation Age of Onset   Drug abuse Brother    Hypertension Mother    Colon cancer Maternal Grandfather   She notes that her father died at age 46 from an accidental head injury.The patient's mother is 54 years old as of October 2018. Pt has one brother and no sisters. Pt reports that a second cousin has a hx of breast cancer and was dx in her 63's. Pt denies family hx of ovarian cancer.   GYNECOLOGIC HISTORY:  Patient's last menstrual period was 06/18/2010. Menarche: 67 years old Age at first live birth: No children GP: GXP0 LMP: March 2016 Contraceptive: OCP on and off for many years d/c after DVT and PE diagnosis.  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  She lives by herself.  She keeps no pets   ADVANCED DIRECTIVES: Not in place. At the 11/20/2016 visit the patient was given the appropriate documents to complete and notarize at her discretion   HEALTH MAINTENANCE: Social History   Tobacco Use   Smoking status: Former    Current packs/day: 0.00    Average packs/day: 0.4 packs/day for 10.0 years (4.0 ttl pk-yrs)    Types: Cigarettes    Start date:  05/31/1992    Quit date: 06/01/2002    Years since quitting: 20.5   Smokeless tobacco: Never  Vaping Use   Vaping status: Never Used  Substance Use Topics   Alcohol use: Yes    Comment: occasional 1 drink per month   Drug use: No     Colonoscopy: 2013  PAP: 08/30/2016   Bone density: 10/04/2015 with T-score of -0.3 at femur neck right   Allergies  Allergen Reactions   Amoxicillin-Pot Clavulanate Other (See Comments)    Dizziness    Niacin-Lovastatin Er Other (See Comments)    Caused flushing     Current Outpatient Medications  Medication Sig Dispense Refill   anastrozole (ARIMIDEX) 1 MG tablet Take 1 tablet (1 mg total) by mouth daily. 90 tablet 4   Ascorbic Acid (VITAMIN C) 1000 MG tablet Take 1,000 mg by mouth daily.     aspirin EC 81 MG tablet Take 1 tablet (81 mg total) by mouth daily. 90 tablet 3   atorvastatin (LIPITOR) 20 MG tablet Take 20 mg  by mouth daily.     candesartan (ATACAND) 8 MG tablet Take 1 tablet (8 mg total) by mouth daily.     Cholecalciferol (VITAMIN D-3 PO) Take 1 capsule by mouth daily.     hydrALAZINE (APRESOLINE) 10 MG tablet Take 0.5 tablets (5 mg total) by mouth as needed (for diastolic greater than 110 for up to 2 doses.). 30 tablet 3   LORazepam (ATIVAN) 0.5 MG tablet Take 0.5 mg by mouth 2 (two) times daily as needed.     methimazole (TAPAZOLE) 5 MG tablet Take 1.5 tablets (7.5 mg total) by mouth daily. 135 tablet 2   Omega-3 Fatty Acids (OMEGA-3 FISH OIL PO) Take 2,000 mg by mouth.     No current facility-administered medications for this visit.    OBJECTIVE:  African-American woman in no acute distress  Vitals:   12/05/22 1155  BP: 125/71  Pulse: 67  Resp: 17  Temp: (!) 97.3 F (36.3 C)  SpO2: 100%      Body mass index is 29.3 kg/m.   Wt Readings from Last 3 Encounters:  12/05/22 184 lb 4.8 oz (83.6 kg)  10/30/22 180 lb (81.6 kg)  10/15/22 179 lb 6.4 oz (81.4 kg)     ECOG FS:1 - Symptomatic but completely ambulatory  Physical  Exam Constitutional:      Appearance: Normal appearance.  Chest:     Comments: Bilateral breasts inspected.  Right breast with some post treatment changes and surgical scarring.  No palpable masses.  No regional adenopathy Musculoskeletal:        General: No swelling.     Cervical back: Normal range of motion and neck supple. No rigidity.  Lymphadenopathy:     Cervical: No cervical adenopathy.  Neurological:     Mental Status: She is alert.       LAB RESULTS:  CMP     Component Value Date/Time   NA 141 10/15/2022 1305   NA 142 01/26/2021 1025   NA 141 11/20/2016 0822   K 4.0 10/15/2022 1305   K 3.6 11/20/2016 0822   CL 105 10/15/2022 1305   CL 105 06/24/2012 1548   CO2 26 10/15/2022 1305   CO2 24 11/20/2016 0822   GLUCOSE 115 (H) 10/15/2022 1305   GLUCOSE 102 11/20/2016 0822   GLUCOSE 91 06/24/2012 1548   BUN 13 10/15/2022 1305   BUN 12 01/26/2021 1025   BUN 19.2 11/20/2016 0822   CREATININE 0.91 10/15/2022 1305   CREATININE 0.88 05/30/2021 1125   CREATININE 0.9 11/20/2016 0822   CALCIUM 9.6 10/15/2022 1305   CALCIUM 9.5 11/20/2016 0822   PROT 8.0 10/15/2022 1305   PROT 7.8 01/26/2021 1025   PROT 8.2 11/20/2016 0822   ALBUMIN 4.2 10/15/2022 1305   ALBUMIN 4.3 01/26/2021 1025   ALBUMIN 3.8 11/20/2016 0822   AST 16 10/15/2022 1305   AST 14 (L) 05/30/2021 1125   AST 15 11/20/2016 0822   ALT 16 10/15/2022 1305   ALT 14 05/30/2021 1125   ALT 15 11/20/2016 0822   ALKPHOS 310 (H) 10/15/2022 1305   ALKPHOS 122 11/20/2016 0822   BILITOT 0.5 10/15/2022 1305   BILITOT 0.5 05/30/2021 1125   BILITOT 0.52 11/20/2016 0822   GFRNONAA >60 05/30/2021 1125   GFRAA >60 03/18/2019 1631    No results found for: "TOTALPROTELP", "ALBUMINELP", "A1GS", "A2GS", "BETS", "BETA2SER", "GAMS", "MSPIKE", "SPEI"  No results found for: "KPAFRELGTCHN", "LAMBDASER", "KAPLAMBRATIO"  Lab Results  Component Value Date   WBC 6.0 12/05/2022  NEUTROABS 3.3 12/05/2022   HGB 12.9  12/05/2022   HCT 38.1 12/05/2022   MCV 96.7 12/05/2022   PLT 150 12/05/2022    Lab Results  Component Value Date   LABCA2 17 04/26/2008    No components found for: "[id number]"  No results for input(s): "INR" in the last 168 hours.  Lab Results  Component Value Date   LABCA2 17 04/26/2008    No results found for: "CAN199"  No results found for: "CAN125"  No results found for: "CAN153"  No results found for: "CA2729"  No components found for: "HGQUANT"  No results found for: "CEA1", "CEA" / No results found for: "CEA1", "CEA"   No results found for: "AFPTUMOR"  No results found for: "CHROMOGRNA"  No results found for: "HGBA", "HGBA2QUANT", "HGBFQUANT", "HGBSQUAN" (Hemoglobinopathy evaluation)   No results found for: "LDH"  Lab Results  Component Value Date   IRON 61 12/25/2012   TIBC 331 12/25/2012   IRONPCTSAT 18 (L) 12/25/2012   (Iron and TIBC)  Lab Results  Component Value Date   FERRITIN 31 12/25/2012    Urinalysis    Component Value Date/Time   COLORURINE LT YELLOW 04/18/2008 1220   APPEARANCEUR Clear 04/18/2008 1220   LABSPEC > OR = 1.030 04/18/2008 1220   PHURINE 5.5 04/18/2008 1220   GLUCOSEU NEGATIVE 04/18/2008 1220   BILIRUBINUR NEGATIVE 04/18/2008 1220   KETONESUR NEGATIVE 04/18/2008 1220   UROBILINOGEN 0.2 mg/dL 40/98/1191 4782   NITRITE Negative 04/18/2008 1220   LEUKOCYTESUR Negative 04/18/2008 1220    STUDIES: MM DIAG BREAST TOMO BILATERAL  Result Date: 11/25/2022 CLINICAL DATA:  67 year old female presenting for annual exam. History of right breast cancer status post lumpectomy in 2018. No new problems. EXAM: DIGITAL DIAGNOSTIC BILATERAL MAMMOGRAM WITH TOMOSYNTHESIS AND CAD TECHNIQUE: Bilateral digital diagnostic mammography and breast tomosynthesis was performed. The images were evaluated with computer-aided detection. COMPARISON:  Previous exam(s). ACR Breast Density Category b: There are scattered areas of fibroglandular  density. FINDINGS: Right breast: A spot 2D magnification view of the lumpectomy site was performed in addition to standard views. There are stable postsurgical changes. No suspicious mass, distortion, or microcalcifications are identified to suggest presence of malignancy. Left breast: No suspicious mass, distortion, or microcalcifications are identified to suggest presence of malignancy. IMPRESSION: 1. No mammographic evidence of malignancy in the bilateral breasts. 2. Stable postsurgical changes in the right breast. RECOMMENDATION: Screening mammogram in one year.(Code:SM-B-01Y) I have discussed the findings and recommendations with the patient. If applicable, a reminder letter will be sent to the patient regarding the next appointment. BI-RADS CATEGORY  2: Benign. Electronically Signed   By: Emmaline Kluver M.D.   On: 11/25/2022 11:55      ELIGIBLE FOR AVAILABLE RESEARCH PROTOCOL: no   ASSESSMENT: 67 y.o. [place] woman status post right breast lower outer quadrant biopsy 11/11/2016 for a clinical TX N), stage I invasive ductal carcinoma, grade 2, E-cadherin positive, estrogen receptor 95% positive, progesterone receptor 5% positive, with an MIB-1 of 10%, and no HER-2 amplification  (1) Status post right lumpectomy and right axillary sentinel lymph node sampling 12/16/2016 for a pT1c pN0, stage IA invasive ductal carcinoma, grade 1, with negative margins.  Total of 3 sentinel lymph nodes removed  (2) The Oncotype DX score was 18, predicting a risk of outside the breast recurrence over the next 10 years of 11% if the patient's only systemic therapy is tamoxifen for 5 years.  It also predicts no benefit from chemotherapy.  (3) Adjuvant radiation  02/05/2017-03/05/2017 Site/dose:    1. Right breast, 2.67 Gy in 15 fractions for a total dose of 40.05 Gy                    2. Right breast boost, 2 Gy in 5 fractions for a total dose of 10 Gy   (4) anastrozole started February 2019  (a) not a good  tamoxifen candidate given problem #5  (b) bone density at breast center 10/04/2015 with T-score of -0.3 (normal)  (c) bone density 12/28/2018 showed a T score of -0.4 (normal)  (d) bone density 04/26/2021  (5) history of DVT/PE March 2010, with negative extensive hypercoagulable workup, IVC filter in place   PLAN:   She is here for follow-up on adjuvant anastrozole.  She will complete antiestrogen therapy in February 2024 but would like to go up to 7 years.  She is tolerating this very well.  She denies any complaints at all from anastrozole standpoint.  Most recent mammogram once again unremarkable, no evidence of malignancy.  Physical examination today benign, no palpable masses or regional adenopathy. She will continue vitamin D and calcium supplementation and I once again encouraged exercising. She would like to return to clinic in 1 year.  She was encouraged to call us sooner with any new questions or concerns Total time spent: 30 minutes  *Total Encounter Time as defined by the Centers for Medicare and Medicaid Services includes, in addition to the face-to-face time of a patient visit (documented in the note above) non-face-to-face time: obtaining and reviewing outside history, ordering and reviewing medications, tests or procedures, care coordination (communications with other health care professionals or caregivers) and documentation in the medical record.

## 2022-12-06 ENCOUNTER — Other Ambulatory Visit: Payer: Self-pay | Admitting: Hematology and Oncology

## 2022-12-06 ENCOUNTER — Other Ambulatory Visit: Payer: Self-pay | Admitting: *Deleted

## 2022-12-06 DIAGNOSIS — R748 Abnormal levels of other serum enzymes: Secondary | ICD-10-CM

## 2022-12-06 DIAGNOSIS — Z170 Estrogen receptor positive status [ER+]: Secondary | ICD-10-CM

## 2022-12-16 ENCOUNTER — Other Ambulatory Visit (INDEPENDENT_AMBULATORY_CARE_PROVIDER_SITE_OTHER): Payer: Federal, State, Local not specified - PPO

## 2022-12-16 DIAGNOSIS — E0590 Thyrotoxicosis, unspecified without thyrotoxic crisis or storm: Secondary | ICD-10-CM | POA: Diagnosis not present

## 2022-12-16 LAB — T4, FREE: Free T4: 0.62 ng/dL (ref 0.60–1.60)

## 2022-12-16 LAB — TSH: TSH: 3.33 u[IU]/mL (ref 0.35–5.50)

## 2022-12-17 ENCOUNTER — Other Ambulatory Visit: Payer: Self-pay | Admitting: Internal Medicine

## 2022-12-17 LAB — HEPATIC FUNCTION PANEL (6)
ALT: 15 [IU]/L (ref 0–32)
AST: 15 [IU]/L (ref 0–40)
Albumin: 4.2 g/dL (ref 3.9–4.9)
Alkaline Phosphatase: 262 [IU]/L — ABNORMAL HIGH (ref 44–121)
Bilirubin Total: 0.3 mg/dL (ref 0.0–1.2)
Bilirubin, Direct: 0.13 mg/dL (ref 0.00–0.40)

## 2022-12-17 MED ORDER — METHIMAZOLE 5 MG PO TABS: 5.0000 mg | ORAL_TABLET | Freq: Every day | ORAL | 2 refills | Status: DC

## 2022-12-25 ENCOUNTER — Encounter (HOSPITAL_COMMUNITY)
Admission: RE | Admit: 2022-12-25 | Discharge: 2022-12-25 | Disposition: A | Discharge status: Home / Self Care | Payer: Federal, State, Local not specified - PPO | Source: Ambulatory Visit | Attending: Hematology and Oncology | Admitting: Hematology and Oncology

## 2022-12-25 ENCOUNTER — Ambulatory Visit: Payer: Federal, State, Local not specified - PPO | Admitting: Cardiology

## 2022-12-25 DIAGNOSIS — C50511 Malignant neoplasm of lower-outer quadrant of right female breast: Secondary | ICD-10-CM | POA: Insufficient documentation

## 2022-12-25 DIAGNOSIS — R748 Abnormal levels of other serum enzymes: Secondary | ICD-10-CM | POA: Diagnosis not present

## 2022-12-25 DIAGNOSIS — Z170 Estrogen receptor positive status [ER+]: Secondary | ICD-10-CM | POA: Insufficient documentation

## 2022-12-25 DIAGNOSIS — R937 Abnormal findings on diagnostic imaging of other parts of musculoskeletal system: Secondary | ICD-10-CM | POA: Diagnosis not present

## 2022-12-25 DIAGNOSIS — C50919 Malignant neoplasm of unspecified site of unspecified female breast: Secondary | ICD-10-CM | POA: Diagnosis not present

## 2022-12-25 MED ORDER — TECHNETIUM TC 99M MEDRONATE IV KIT
20.0000 | PACK | Freq: Once | INTRAVENOUS | Status: AC | PRN
  Administered 2022-12-25: 20 via INTRAVENOUS

## 2023-01-14 ENCOUNTER — Telehealth: Payer: Self-pay

## 2023-01-14 NOTE — Telephone Encounter (Signed)
Called pt per MD to give results. She verbalized thanks and understanding.

## 2023-01-14 NOTE — Telephone Encounter (Signed)
-----   Message from Bickleton Iruku sent at 01/14/2023  8:47 AM EST ----- Vernona Rieger  Please let pt know that bone scan showed degenerative changes, no concerns for met disease.

## 2023-02-03 DIAGNOSIS — Z01419 Encounter for gynecological examination (general) (routine) without abnormal findings: Secondary | ICD-10-CM | POA: Diagnosis not present

## 2023-02-03 DIAGNOSIS — Z113 Encounter for screening for infections with a predominantly sexual mode of transmission: Secondary | ICD-10-CM | POA: Diagnosis not present

## 2023-02-20 DIAGNOSIS — R0789 Other chest pain: Secondary | ICD-10-CM | POA: Diagnosis not present

## 2023-02-20 DIAGNOSIS — E785 Hyperlipidemia, unspecified: Secondary | ICD-10-CM | POA: Diagnosis not present

## 2023-03-04 ENCOUNTER — Ambulatory Visit: Payer: Federal, State, Local not specified - PPO | Admitting: Internal Medicine

## 2023-03-04 VITALS — BP 146/82 | HR 74 | Resp 20 | Ht 66.5 in | Wt 193.4 lb

## 2023-03-04 DIAGNOSIS — E0590 Thyrotoxicosis, unspecified without thyrotoxic crisis or storm: Secondary | ICD-10-CM

## 2023-03-04 DIAGNOSIS — E041 Nontoxic single thyroid nodule: Secondary | ICD-10-CM

## 2023-03-04 DIAGNOSIS — E0500 Thyrotoxicosis with diffuse goiter without thyrotoxic crisis or storm: Secondary | ICD-10-CM

## 2023-03-04 DIAGNOSIS — R748 Abnormal levels of other serum enzymes: Secondary | ICD-10-CM | POA: Diagnosis not present

## 2023-03-04 NOTE — Progress Notes (Unsigned)
Name: [name]  MRN/ DOB: [id number], [birth year]    Age/ Sex: 68 y.o., female     PCP: Lindaann Pascal, PA-C   Reason for Endocrinology Evaluation: Graves' Disease      Initial Endocrinology Clinic Visit: 02/16/2020    PATIENT IDENTIFIER: [name] is a 67 y.o., female with a past medical history of Hx of breast ca ( S/P right lumpectomy 2018 and radiation ) and Graves' disease . She has followed with [facility] since 02/16/2020 for consultative assistance with management of her hyperthyroidism.   HISTORICAL SUMMARY:   She has been diagnosed with Graves' disease in 2016. Had a thyroid uptake and scan on 08/13/2014 at 57.5 % of I-131 , uniform uptake consistent with Graves's disease. Has been on Methimazole since her diagnosis.    SUBJECTIVE:    Today (03/04/2023):  [name] is here for a follow up on Graves disease .  She continues to follow-up with oncology for history of breast cancer  Weight has been increasing  Denies local neck swelling   Denies recent palpitations  Denies diarrhea or constipation  Denies tremors  Denies jittery sensation but has occasional anxiety   HOME ENDOCRINE MEDICATION  Methimazole 5 mg daily ,1 tab  daily       HISTORY:  Past Medical History:  Past Medical History:  Diagnosis Date   ALLERGIC RHINITIS 03/30/2008   Qualifier: Diagnosis of  By: Jonny Ruiz MD, Len Blalock    Anxiety    Arthritis    neck   Breast cancer (HCC) 2018   Right Breast Cancer   Deep venous thrombosis (HCC) 01/26/2021   Dysarthria 09/22/2012   GERD (gastroesophageal reflux disease)    Graves' disease 09/03/2014   Headache(784.0) 09/22/2012   History of hiatal hernia    History of radiation therapy 02/05/2017   02/05/17-03/05/17,  right breast 40.05 Gy in 15 fractions, right breast boost 10 Gy in 5 fractions   Hyperlipidemia    Hypertension    Hyperthyroidism    Iron deficiency anemia    LEG PAIN, RIGHT 04/22/2008   Qualifier: Diagnosis  of  By: Jonny Ruiz MD, Len Blalock    Low back pain 01/26/2021   Malignant tumor of breast (HCC) 11/13/2016   Palpitation 09/24/2013   Personal history of radiation therapy 2018   Right Breast Cancer   Pulmonary emboli (HCC) 11/20/2016   Uterine fibroid    Vitamin D deficiency    Graves disease   Past Surgical History:  Past Surgical History:  Procedure Laterality Date   BREAST LUMPECTOMY Right 12/16/2016   BREAST LUMPECTOMY WITH RADIOACTIVE SEED AND SENTINEL LYMPH NODE BIOPSY Right 12/16/2016   Procedure: RIGHT BREAST LUMPECTOMY WITH RADIOACTIVE SEED AND SENTINEL LYMPH NODE BIOPSY;  Surgeon: Emelia Loron, MD;  Location: [facility] OR;  Service: General;  Laterality: Right;   CHOLECYSTECTOMY     COLONOSCOPY     DILATION AND CURETTAGE OF UTERUS     01/2008   ESOPHAGOGASTRODUODENOSCOPY     ivc filter     Social History:  reports that she quit smoking about 20 years ago. Her smoking use included cigarettes. She started smoking about 30 years ago. She has a 4 pack-year smoking history. She has never used smokeless tobacco. She reports current alcohol use. She reports that she does not use drugs. Family History:  Family History  Problem Relation Age of Onset   Drug abuse Brother    Hypertension Mother    Colon cancer Maternal Grandfather  HOME MEDICATIONS: Allergies as of 03/04/2023       Reactions   Amoxicillin-pot Clavulanate Other (See Comments)   Dizziness   Niacin-lovastatin Er Other (See Comments)   Caused flushing        Medication List        Accurate as of March 04, 2023  1:17 PM. If you have any questions, ask your nurse or doctor.          anastrozole 1 MG tablet Commonly known as: ARIMIDEX Take 1 tablet (1 mg total) by mouth daily.   aspirin EC 81 MG tablet Take 1 tablet (81 mg total) by mouth daily.   atorvastatin 20 MG tablet Commonly known as: LIPITOR Take 20 mg by mouth daily.   candesartan 8 MG tablet Commonly known as: Atacand Take 1 tablet (8  mg total) by mouth daily.   hydrALAZINE 10 MG tablet Commonly known as: APRESOLINE Take 0.5 tablets (5 mg total) by mouth as needed (for diastolic greater than 110 for up to 2 doses.).   LORazepam 0.5 MG tablet Commonly known as: ATIVAN Take 0.5 mg by mouth 2 (two) times daily as needed.   methimazole 5 MG tablet Commonly known as: TAPAZOLE Take 1 tablet (5 mg total) by mouth daily.   OMEGA-3 FISH OIL PO Take 2,000 mg by mouth.   vitamin C 1000 MG tablet Take 1,000 mg by mouth daily.   VITAMIN D-3 PO Take 1 capsule by mouth daily.          OBJECTIVE:   PHYSICAL EXAM: VS: BP (!) 146/82 (BP Location: Left Arm, Patient Position: Sitting, Cuff Size: Large)   Pulse 74   Resp 20   Ht 5' 6.5" (1.689 m)   Wt 193 lb 6.4 oz (87.7 kg)   LMP 06/18/2010   SpO2 99%   BMI 30.75 kg/m     EXAM: General: [name] appears well and is in NAD  Neck: General: Supple without adenopathy. Thyroid: Thyroid asymmetry noted on exam today. No thyroid bruit.  Lungs: Clear with good BS bilat   Heart: Auscultation: RRR.  Abdomen: soft, nontender  Mental Status: Judgment, insight: Intact Orientation: Oriented to time, place, and person Mood and affect: No depression, anxiety, or agitation     DATA REVIEWED:  Latest Reference Range & Units 03/04/23 13:38  TSH 0.40 - 4.50 mIU/L 0.24 (L)  Triiodothyronine,Free,Serum 2.3 - 4.2 pg/mL 4.2  T4,Free(Direct) 0.8 - 1.8 ng/dL 1.4  (L): Data is abnormally low   Latest Reference Range & Units 10/15/22 13:05  Sodium 135 - 145 mEq/L 141  Potassium 3.5 - 5.1 mEq/L 4.0  Chloride 96 - 112 mEq/L 105  CO2 19 - 32 mEq/L 26  Glucose 70 - 99 mg/dL 578 (H)  BUN 6 - 23 mg/dL 13  Creatinine 4.69 - 6.29 mg/dL 5.28  Calcium 8.4 - 41.3 mg/dL 9.6  Alkaline Phosphatase 39 - 117 U/L 310 (H)  Albumin 3.5 - 5.2 g/dL 4.2  AST 0 - 37 U/L 16  ALT 0 - 35 U/L 16  Total Protein 6.0 - 8.3 g/dL 8.0  Total Bilirubin 0.2 - 1.2 mg/dL 0.5  GFR >24.40 mL/min 66.50      Latest Reference Range & Units 10/15/22 13:05  WBC 4.0 - 10.5 K/uL 5.4  RBC 3.87 - 5.11 Mil/uL 4.24  Hemoglobin 12.0 - 15.0 g/dL 10.2  HCT 72.5 - 36.6 % 40.3  MCV 78.0 - 100.0 fl 95.0  MCHC 30.0 - 36.0 g/dL 44.0  RDW 34.7 -  15.5 % 14.6  Platelets 150.0 - 400.0 K/uL 147.0 (L)  Neutrophils 43.0 - 77.0 % 52.3  Lymphocytes 12.0 - 46.0 % 36.8  Monocytes Relative 3.0 - 12.0 % 6.0  Eosinophil 0.0 - 5.0 % 4.2  Basophil 0.0 - 3.0 % 0.7  NEUT# 1.4 - 7.7 K/uL 2.8  Lymphocyte # 0.7 - 4.0 K/uL 2.0  Monocyte # 0.1 - 1.0 K/uL 0.3  Eosinophils Absolute 0.0 - 0.7 K/uL 0.2  Basophils Absolute 0.0 - 0.1 K/uL 0.0     Latest Reference Range & Units 10/15/22 13:05  TSH 0.35 - 5.50 uIU/mL 7.19 (H)  Triiodothyronine,Free,Serum 2.3 - 4.2 pg/mL 3.0  T4,Free(Direct) 0.60 - 1.60 ng/dL 6.04 (L)  (H): Data is abnormally high (L): Data is abnormally low       Thyroid ultrasound 05/02/2022  Estimated total number of nodules >/= 1 cm: 1   Number of spongiform nodules >/=  2 cm not described below (TR1): 0   Number of mixed cystic and solid nodules >/= 1.5 cm not described below (TR2): 0   _________________________________________________________   Nodule # 1:   Location: Right; Inferior   Maximum size: 1.6 cm; Other 2 dimensions: 1.4 x 1.3 cm   Composition: solid/almost completely solid (2)   Echogenicity: isoechoic (1)   Shape: not taller-than-wide (0)   Margins: ill-defined (0)   Echogenic foci: none (0)   ACR TI-RADS total points: 3.   ACR TI-RADS risk category: TR3 (3 points).   ACR TI-RADS recommendations:   *Given size (>/= 1.5 - 2.4 cm) and appearance, a follow-up ultrasound in 1 year should be considered based on TI-RADS criteria.   _________________________________________________________   IMPRESSION: 1. Diffusely enlarged and heterogeneous thyroid gland most consistent with chronic thyroiditis or diffuse goitrous change. 2. A solitary 1.6 cm TI-RADS category 3  nodule is visualized in the right lower gland. This nodule meets criteria for imaging surveillance. Recommend follow-up ultrasound in 1 year.    ASSESSMENT / PLAN / RECOMMENDATIONS:    Hyperthyroidism secondary to Graves' Disease:    -Patient is clinically euthyroid - Has been on methimazole since 2016,  -Patient is clinically euthyroid -TSH slightly low, will increase methimazole as below   Medications :  methimazole 5 mg, take 2 tablets on Sundays, 1 tablet rest of the week  2. Graves' Disease:     No extra-thyroidal manifestations of graves' disease  3.  MNG  -Thyroid ultrasound 04/2022 revealed 1.6 cm right thyroid nodule met criteria for surveillance - No local neck symptoms  -Will repeat this year    4.  Elevated alkaline phosphatase:  -Historically this has been fluctuating - Will Proceed with Alk. Pho . Isozymes    Follow-up in 4 months    Addendum: Discussed lab results with the patient on 03/05/2023 at 1545 including medication changes  Signed electronically by: Lyndle Herrlich, MD  [facility]  [facility] [address] [place] [place], [place] [address] Phone: [phone] FAX: [phone]      CC: Mike Craze [address] [place] [address] Phone: [phone]  Fax: [phone]   Return to Endocrinology clinic as below: Future Appointments  Date Time Provider Department Center  12/08/2023 11:45 AM Rachel Moulds, MD [facility] None

## 2023-03-05 MED ORDER — METHIMAZOLE 5 MG PO TABS: 5.0000 mg | ORAL_TABLET | ORAL | 2 refills | Status: DC

## 2023-03-10 LAB — ALKALINE PHOSPHATASE ISOENZYMES
Alkaline phosphatase (APISO): 209 U/L — ABNORMAL HIGH (ref 37–153)
Bone Isoenzymes: 64 % (ref 28–66)
Intestinal Isoenzymes: 0 % — ABNORMAL LOW (ref 1–24)
Liver Isoenzymes: 36 % (ref 25–69)
Macrohepatic isoenzymes: 0 % (ref <=0)
Placental isoenzymes: 0 % (ref <=0)

## 2023-03-10 LAB — T4, FREE: Free T4: 1.4 ng/dL (ref 0.8–1.8)

## 2023-03-10 LAB — T3, FREE: T3, Free: 4.2 pg/mL (ref 2.3–4.2)

## 2023-03-10 LAB — TSH: TSH: 0.24 m[IU]/L — ABNORMAL LOW (ref 0.40–4.50)

## 2023-03-13 ENCOUNTER — Other Ambulatory Visit: Payer: Federal, State, Local not specified - PPO

## 2023-03-14 ENCOUNTER — Telehealth: Payer: Self-pay | Admitting: Internal Medicine

## 2023-03-14 DIAGNOSIS — M859 Disorder of bone density and structure, unspecified: Secondary | ICD-10-CM

## 2023-03-14 NOTE — Telephone Encounter (Signed)
Please let the pt know that the test that I did on alkaline phosphatase shows that her liver is fine.    She is due for a bone density,I have ordered it. Please schedule it at the [facility] for March or later   Thanks

## 2023-03-17 NOTE — Telephone Encounter (Signed)
 LMTCB

## 2023-03-17 NOTE — Telephone Encounter (Signed)
Patient aware and Bone Density has been scheduled

## 2023-03-22 DIAGNOSIS — Z20822 Contact with and (suspected) exposure to covid-19: Secondary | ICD-10-CM | POA: Diagnosis not present

## 2023-03-22 DIAGNOSIS — R051 Acute cough: Secondary | ICD-10-CM | POA: Diagnosis not present

## 2023-03-22 DIAGNOSIS — J3489 Other specified disorders of nose and nasal sinuses: Secondary | ICD-10-CM | POA: Diagnosis not present

## 2023-03-24 DIAGNOSIS — R0789 Other chest pain: Secondary | ICD-10-CM | POA: Diagnosis not present

## 2023-03-24 DIAGNOSIS — K219 Gastro-esophageal reflux disease without esophagitis: Secondary | ICD-10-CM | POA: Diagnosis not present

## 2023-03-27 ENCOUNTER — Ambulatory Visit
Admission: RE | Admit: 2023-03-27 | Discharge: 2023-03-27 | Disposition: A | Discharge status: Home / Self Care | Payer: Federal, State, Local not specified - PPO | Source: Ambulatory Visit | Attending: Internal Medicine | Admitting: Internal Medicine

## 2023-03-27 DIAGNOSIS — E0500 Thyrotoxicosis with diffuse goiter without thyrotoxic crisis or storm: Secondary | ICD-10-CM | POA: Diagnosis not present

## 2023-03-27 DIAGNOSIS — E041 Nontoxic single thyroid nodule: Secondary | ICD-10-CM

## 2023-04-01 ENCOUNTER — Emergency Department (HOSPITAL_COMMUNITY)
Admission: EM | Admit: 2023-04-01 | Discharge: 2023-04-02 | Disposition: A | Discharge status: Home / Self Care | Payer: Federal, State, Local not specified - PPO | Attending: Emergency Medicine | Admitting: Emergency Medicine

## 2023-04-01 ENCOUNTER — Other Ambulatory Visit: Payer: Self-pay

## 2023-04-01 ENCOUNTER — Encounter (HOSPITAL_COMMUNITY): Payer: Self-pay

## 2023-04-01 ENCOUNTER — Emergency Department (HOSPITAL_COMMUNITY): Payer: Federal, State, Local not specified - PPO

## 2023-04-01 DIAGNOSIS — R072 Precordial pain: Secondary | ICD-10-CM | POA: Diagnosis not present

## 2023-04-01 DIAGNOSIS — Z79899 Other long term (current) drug therapy: Secondary | ICD-10-CM | POA: Diagnosis not present

## 2023-04-01 DIAGNOSIS — R079 Chest pain, unspecified: Secondary | ICD-10-CM | POA: Diagnosis not present

## 2023-04-01 DIAGNOSIS — I10 Essential (primary) hypertension: Secondary | ICD-10-CM | POA: Insufficient documentation

## 2023-04-01 DIAGNOSIS — J984 Other disorders of lung: Secondary | ICD-10-CM | POA: Diagnosis not present

## 2023-04-01 DIAGNOSIS — Z7982 Long term (current) use of aspirin: Secondary | ICD-10-CM | POA: Insufficient documentation

## 2023-04-01 DIAGNOSIS — E041 Nontoxic single thyroid nodule: Secondary | ICD-10-CM | POA: Diagnosis not present

## 2023-04-01 DIAGNOSIS — Z853 Personal history of malignant neoplasm of breast: Secondary | ICD-10-CM | POA: Insufficient documentation

## 2023-04-01 DIAGNOSIS — R918 Other nonspecific abnormal finding of lung field: Secondary | ICD-10-CM | POA: Diagnosis not present

## 2023-04-01 DIAGNOSIS — I719 Aortic aneurysm of unspecified site, without rupture: Secondary | ICD-10-CM | POA: Diagnosis not present

## 2023-04-01 LAB — BASIC METABOLIC PANEL
Anion gap: 12 (ref 5–15)
BUN: 21 mg/dL (ref 8–23)
CO2: 25 mmol/L (ref 22–32)
Calcium: 9.3 mg/dL (ref 8.9–10.3)
Chloride: 102 mmol/L (ref 98–111)
Creatinine, Ser: 1.04 mg/dL — ABNORMAL HIGH (ref 0.44–1.00)
GFR, Estimated: 60 mL/min — ABNORMAL LOW (ref >60)
Glucose, Bld: 124 mg/dL — ABNORMAL HIGH (ref 70–99)
Potassium: 3.9 mmol/L (ref 3.5–5.1)
Sodium: 139 mmol/L (ref 135–145)

## 2023-04-01 LAB — CBC
HCT: 41 % (ref 36.0–46.0)
Hemoglobin: 13.6 g/dL (ref 12.0–15.0)
MCH: 31.9 pg (ref 26.0–34.0)
MCHC: 33.2 g/dL (ref 30.0–36.0)
MCV: 96 fL (ref 80.0–100.0)
Platelets: 171 10*3/uL (ref 150–400)
RBC: 4.27 MIL/uL (ref 3.87–5.11)
RDW: 12.3 % (ref 11.5–15.5)
WBC: 9.7 10*3/uL (ref 4.0–10.5)
nRBC: 0 % (ref 0.0–0.2)

## 2023-04-01 LAB — TROPONIN I (HIGH SENSITIVITY)
Troponin I (High Sensitivity): 4 ng/L (ref <18)
Troponin I (High Sensitivity): 5 ng/L (ref <18)

## 2023-04-01 NOTE — ED Provider Triage Note (Signed)
 Emergency Medicine Provider Triage Evaluation Note  [name] , a 68 y.o. female  was evaluated in triage.  Pt complains of chest pain.  Started about 1.5 hours ago.  It is in the left chest and she believes may radiate to the left side of her face.  States she thinks it may have lasted 30 minutes to an hour but has since subsided.  Endorses some intermittent shortness of breath.  Recently finished up a course of steroids and antibiotics for sinus infection.  Denies cough and fever.  Also mention that she has been constipated.  Last bowel movement today.  Still passing flatus.  Denies nausea or vomiting.  Denies abdominal pain.  Not currently on a bowel regimen at home.  Review of Systems  Positive: See above Negative: See above  Physical Exam  BP (!) 154/93 (BP Location: Right Arm)   Pulse 73   Temp 98 F (36.7 C) (Oral)   Resp 16   Ht 5\' 7"  (1.702 m)   Wt 88.5 kg   LMP 06/18/2010   SpO2 98%   BMI 30.54 kg/m  Gen:   Awake, no distress   Resp:  Normal effort  MSK:   Moves extremities without difficulty  Other:    Medical Decision Making  Medically screening exam initiated at 6:46 PM.  Appropriate orders placed.  [name] was informed that the remainder of the evaluation will be completed by another provider, this initial triage assessment does not replace that evaluation, and the importance of remaining in the ED until their evaluation is complete.  Work up started   Gareth Eagle, [place] 04/01/23 [phone]

## 2023-04-01 NOTE — ED Triage Notes (Addendum)
 Pt to ED c/o sudden onset cp while driving aprox 1 hour ago, decreased since then, and intermittent in nature. Denies SHOB/N/V. Reports feels anxious, hx anxiety. Also reports constipation.   Reports recently finished steroid and abx for sinus infection.

## 2023-04-02 ENCOUNTER — Emergency Department (HOSPITAL_COMMUNITY): Payer: Federal, State, Local not specified - PPO

## 2023-04-02 DIAGNOSIS — J984 Other disorders of lung: Secondary | ICD-10-CM | POA: Diagnosis not present

## 2023-04-02 DIAGNOSIS — E041 Nontoxic single thyroid nodule: Secondary | ICD-10-CM | POA: Diagnosis not present

## 2023-04-02 DIAGNOSIS — R918 Other nonspecific abnormal finding of lung field: Secondary | ICD-10-CM | POA: Diagnosis not present

## 2023-04-02 DIAGNOSIS — I719 Aortic aneurysm of unspecified site, without rupture: Secondary | ICD-10-CM | POA: Diagnosis not present

## 2023-04-02 LAB — HEPATIC FUNCTION PANEL
ALT: 21 U/L (ref 0–44)
AST: 17 U/L (ref 15–41)
Albumin: 3.5 g/dL (ref 3.5–5.0)
Alkaline Phosphatase: 162 U/L — ABNORMAL HIGH (ref 38–126)
Bilirubin, Direct: 0.1 mg/dL (ref 0.0–0.2)
Indirect Bilirubin: 0.4 mg/dL (ref 0.3–0.9)
Total Bilirubin: 0.5 mg/dL (ref 0.0–1.2)
Total Protein: 7.1 g/dL (ref 6.5–8.1)

## 2023-04-02 MED ORDER — IOHEXOL 350 MG/ML SOLN
75.0000 mL | Freq: Once | INTRAVENOUS | Status: AC | PRN
  Administered 2023-04-02: 75 mL via INTRAVENOUS

## 2023-04-02 NOTE — ED Provider Notes (Signed)
 [facility] AT [facility] Provider Note   CSN: [id number] Arrival date & time: 04/01/23  1814     History  Chief Complaint  Patient presents with   Chest Pain   Constipation    [name] is a 68 y.o. female.  The history is provided by the patient.  Chest Pain Constipation [name] is a 68 y.o. female who presents to the Emergency Department complaining of chest pain.  She presents to the emergency department for evaluation of chest pain that started on Tuesday around 3 PM and lasted about 1 hour.  Pain was located in the left side of her chest and radiated to her face.  It was described as a discomfort sensation.  She did have associated constipation but was able to have 2 BMs after her pain began.  No diarrhea.  No hematochezia or melena.  She did have some associated mild shortness of breath.  She recently completed a course of prednisone and Z-Pak for sinusitis, last dose was yesterday.  She has a history of hypertension, hyperlipidemia, Graves' disease.  She has a remote history of DVT status post IVC filter.  She is not anticoagulated.  No fever, nausea, vomiting.  At time of ED assessment her pain is resolved.     Home Medications Prior to Admission medications   Medication Sig Start Date End Date Taking? Authorizing Provider  anastrozole (ARIMIDEX) 1 MG tablet Take 1 tablet (1 mg total) by mouth daily. 04/17/22   Rachel Moulds, MD  Ascorbic Acid (VITAMIN C) 1000 MG tablet Take 1,000 mg by mouth daily.    [provider]  aspirin EC 81 MG tablet Take 1 tablet (81 mg total) by mouth daily. 10/07/17   Rollene Rotunda, MD  atorvastatin (LIPITOR) 20 MG tablet Take 20 mg by mouth daily. 03/10/21   [provider]  candesartan (ATACAND) 8 MG tablet Take 1 tablet (8 mg total) by mouth daily. 12/18/20   Rollene Rotunda, MD  Cholecalciferol (VITAMIN D-3 PO) Take 1 capsule by mouth daily.    [provider]   hydrALAZINE (APRESOLINE) 10 MG tablet Take 0.5 tablets (5 mg total) by mouth as needed (for diastolic greater than 110 for up to 2 doses.). 12/28/20   Rollene Rotunda, MD  LORazepam (ATIVAN) 0.5 MG tablet Take 0.5 mg by mouth 2 (two) times daily as needed. 12/29/20   [provider]  methimazole (TAPAZOLE) 5 MG tablet Take 1 tablet (5 mg total) by mouth as directed. Take 2 tabs on Sundays and 1 tab rest of week 03/05/23   Shamleffer, Konrad Dolores, MD  Omega-3 Fatty Acids (OMEGA-3 FISH OIL PO) Take 2,000 mg by mouth.    [provider]      Allergies    Amoxicillin-pot clavulanate and Niacin-lovastatin er    Review of Systems   Review of Systems  Cardiovascular:  Positive for chest pain.  Gastrointestinal:  Positive for constipation.  All other systems reviewed and are negative.   Physical Exam Updated Vital Signs BP (!) 147/85   Pulse (!) 58   Temp 98.7 F (37.1 C) (Oral)   Resp (!) 21   Ht 5\' 7"  (1.702 m)   Wt 88.5 kg   LMP 06/18/2010   SpO2 100%   BMI 30.54 kg/m  Physical Exam Vitals and nursing note reviewed.  Constitutional:      Appearance: She is well-developed.  HENT:     Head: Normocephalic and atraumatic.  Cardiovascular:  Rate and Rhythm: Normal rate and regular rhythm.     Heart sounds: No murmur heard. Pulmonary:     Effort: Pulmonary effort is normal. No respiratory distress.     Breath sounds: Normal breath sounds.  Abdominal:     Palpations: Abdomen is soft.     Tenderness: There is no abdominal tenderness. There is no guarding or rebound.  Musculoskeletal:        General: No swelling or tenderness.     Comments: 2+ DP pulses  Skin:    General: Skin is warm and dry.  Neurological:     Mental Status: She is alert and oriented to person, place, and time.  Psychiatric:        Behavior: Behavior normal.     ED Results / Procedures / Treatments   Labs (all labs ordered are listed, but only abnormal results are  displayed) Labs Reviewed  BASIC METABOLIC PANEL - Abnormal; Notable for the following components:      Result Value   Glucose, Bld 124 (*)    Creatinine, Ser 1.04 (*)    GFR, Estimated 60 (*)    All other components within normal limits  HEPATIC FUNCTION PANEL - Abnormal; Notable for the following components:   Alkaline Phosphatase 162 (*)    All other components within normal limits  CBC  TROPONIN I (HIGH SENSITIVITY)  TROPONIN I (HIGH SENSITIVITY)    EKG None  Radiology CT Angio Chest PE W/Cm &/Or Wo Cm Addendum Date: 04/02/2023 ADDENDUM REPORT: 04/02/2023 06:40 ADDENDUM: As noted in the body of the report, the ascending thoracic aorta measures 4.0 cm diameter. Recommend annual imaging followup by CTA or MRA. This recommendation follows 2010 ACCF/AHA/AATS/ACR/ASA/SCA/SCAI/SIR/STS/SVM Guidelines for the Diagnosis and Management of Patients with Thoracic Aortic Disease. Circulation. 2010; 121: Z610-R604. Aortic aneurysm NOS (ICD10-I71.9) Electronically Signed   By: Kennith Center M.D.   On: 04/02/2023 06:40   Result Date: 04/02/2023 CLINICAL DATA:  Chest pain. EXAM: CT ANGIOGRAPHY CHEST WITH CONTRAST TECHNIQUE: Multidetector CT imaging of the chest was performed using the standard protocol during bolus administration of intravenous contrast. Multiplanar CT image reconstructions and MIPs were obtained to evaluate the vascular anatomy. RADIATION DOSE REDUCTION: This exam was performed according to the departmental dose-optimization program which includes automated exposure control, adjustment of the mA and/or kV according to patient size and/or use of iterative reconstruction technique. CONTRAST:  75mL OMNIPAQUE IOHEXOL 350 MG/ML SOLN COMPARISON:  03/18/2019 FINDINGS: Cardiovascular: Heart size upper normal to mildly enlarged. No substantial pericardial effusion. Ascending thoracic aorta measures 4 cm diameter. No large central pulmonary embolus in the main or lobar pulmonary arteries. No  evidence for segmental pulmonary embolus. Subsegmental pulmonary arteries to the lower lobes are obscured by breathing motion during image acquisition. Mediastinum/Nodes: No mediastinal lymphadenopathy. There is no hilar lymphadenopathy. Small hiatal hernia. The esophagus has normal imaging features. There is no axillary lymphadenopathy. 2.7 cm right thyroid nodule evident. This has been evaluated on previous imaging. (ref: J Am Coll Radiol. 2015 Feb;12(2): 143-50). Lungs/Pleura: No focal airspace consolidation. No suspicious pulmonary nodule or mass. No pulmonary edema or pleural effusion. Patchy ground-glass attenuation bilaterally may reflect sequelae of small airways disease. Upper Abdomen: Visualized portion of the upper abdomen shows no acute findings. Musculoskeletal: No worrisome lytic or sclerotic osseous abnormality. Review of the MIP images confirms the above findings. IMPRESSION: 1. No large central pulmonary embolus in the main or lobar pulmonary arteries. No evidence for segmental pulmonary embolus. Subsegmental pulmonary arteries to the lower  lobes are obscured by breathing motion. 2. Patchy ground-glass attenuation bilaterally may reflect sequelae of small airways disease. 3. Small hiatal hernia. Electronically Signed: By: Kennith Center M.D. On: 04/02/2023 06:15   DG Chest 1 View Result Date: 04/01/2023 CLINICAL DATA:  Left-sided chest pain. EXAM: CHEST  1 VIEW COMPARISON:  Chest radiograph dated 03/18/2019. FINDINGS: No focal consolidation, pleural effusion, pneumothorax. The cardiac silhouette is within limits. No acute osseous pathology. IVC filter. IMPRESSION: No active disease. Electronically Signed   By: Elgie Collard M.D.   On: 04/01/2023 20:15    Procedures Procedures    Medications Ordered in ED Medications  iohexol (OMNIPAQUE) 350 MG/ML injection 75 mL (75 mLs Intravenous Contrast Given 04/02/23 0551)    ED Course/ Medical Decision Making/ A&P                                  Medical Decision Making Amount and/or Complexity of Data Reviewed Labs: ordered. Radiology: ordered.  Risk Prescription drug management.   Patient with history of hypertension, Graves' disease, breast cancer in remission, DVT not on anticoagulation here for evaluation of episodic chest pain.  EKG is nonischemic and troponin is negative.  Given her history of prior DVT/PE a CTA was obtained, which is negative for PE.  It does demonstrate sequela of small airway disease, likely secondary to resolving upper respiratory infection.  No clinical evidence of pneumonia at this time.  Feel patient is stable for discharge home with outpatient follow-up and return precautions.  Current clinical picture is not consistent with ACS, dissection, acute abdomen.        Final Clinical Impression(s) / ED Diagnoses Final diagnoses:  Precordial pain    Rx / DC Orders ED Discharge Orders     None         Tilden Fossa, MD 04/02/23 [id number]

## 2023-04-02 NOTE — ED Notes (Signed)
 Clicked off IV completion in error.  KM

## 2023-04-04 ENCOUNTER — Encounter: Payer: Self-pay | Admitting: Internal Medicine

## 2023-04-24 ENCOUNTER — Other Ambulatory Visit: Payer: Self-pay | Admitting: Orthopedic Surgery

## 2023-04-24 ENCOUNTER — Ambulatory Visit (HOSPITAL_COMMUNITY)
Admission: RE | Admit: 2023-04-24 | Discharge: 2023-04-24 | Disposition: A | Discharge status: Home / Self Care | Source: Ambulatory Visit | Attending: Cardiology | Admitting: Cardiology

## 2023-04-24 DIAGNOSIS — M79604 Pain in right leg: Secondary | ICD-10-CM | POA: Diagnosis not present

## 2023-04-24 DIAGNOSIS — M7989 Other specified soft tissue disorders: Secondary | ICD-10-CM | POA: Diagnosis not present

## 2023-04-24 DIAGNOSIS — M25561 Pain in right knee: Secondary | ICD-10-CM | POA: Diagnosis not present

## 2023-05-29 DIAGNOSIS — R300 Dysuria: Secondary | ICD-10-CM | POA: Diagnosis not present

## 2023-05-29 DIAGNOSIS — R5383 Other fatigue: Secondary | ICD-10-CM | POA: Diagnosis not present

## 2023-05-29 DIAGNOSIS — Z0001 Encounter for general adult medical examination with abnormal findings: Secondary | ICD-10-CM | POA: Diagnosis not present

## 2023-05-29 DIAGNOSIS — N390 Urinary tract infection, site not specified: Secondary | ICD-10-CM | POA: Diagnosis not present

## 2023-06-05 ENCOUNTER — Telehealth: Payer: Self-pay

## 2023-06-05 NOTE — Telephone Encounter (Signed)
 Patient would like to know if there is a cheaper option than Forteo.

## 2023-06-30 DIAGNOSIS — I10 Essential (primary) hypertension: Secondary | ICD-10-CM | POA: Diagnosis not present

## 2023-06-30 DIAGNOSIS — R739 Hyperglycemia, unspecified: Secondary | ICD-10-CM | POA: Diagnosis not present

## 2023-06-30 DIAGNOSIS — E782 Mixed hyperlipidemia: Secondary | ICD-10-CM | POA: Diagnosis not present

## 2023-06-30 DIAGNOSIS — R5383 Other fatigue: Secondary | ICD-10-CM | POA: Diagnosis not present

## 2023-06-30 DIAGNOSIS — E559 Vitamin D deficiency, unspecified: Secondary | ICD-10-CM | POA: Diagnosis not present

## 2023-06-30 LAB — LAB REPORT - SCANNED
A1c: 6.8
Albumin, Urine POC: 3.2
Creatinine, POC: 162.2 mg/dL
EGFR: 65
Free T4: 1.08 ng/dL
Microalb Creat Ratio: 2
TSH: 2.83 (ref 0.41–5.90)

## 2023-07-04 DIAGNOSIS — E1169 Type 2 diabetes mellitus with other specified complication: Secondary | ICD-10-CM | POA: Diagnosis not present

## 2023-07-08 ENCOUNTER — Telehealth: Payer: Self-pay | Admitting: Internal Medicine

## 2023-07-08 ENCOUNTER — Encounter: Payer: Self-pay | Admitting: Internal Medicine

## 2023-07-08 ENCOUNTER — Ambulatory Visit: Payer: Federal, State, Local not specified - PPO | Admitting: Internal Medicine

## 2023-07-08 VITALS — BP 126/88 | HR 82 | Ht 67.0 in | Wt 191.0 lb

## 2023-07-08 DIAGNOSIS — M859 Disorder of bone density and structure, unspecified: Secondary | ICD-10-CM | POA: Diagnosis not present

## 2023-07-08 DIAGNOSIS — E0500 Thyrotoxicosis with diffuse goiter without thyrotoxic crisis or storm: Secondary | ICD-10-CM | POA: Diagnosis not present

## 2023-07-08 DIAGNOSIS — E0590 Thyrotoxicosis, unspecified without thyrotoxic crisis or storm: Secondary | ICD-10-CM | POA: Diagnosis not present

## 2023-07-08 DIAGNOSIS — R748 Abnormal levels of other serum enzymes: Secondary | ICD-10-CM

## 2023-07-08 DIAGNOSIS — E041 Nontoxic single thyroid nodule: Secondary | ICD-10-CM

## 2023-07-08 NOTE — Telephone Encounter (Signed)
 I spoke with [name] office at [facility] and they will faxed over lab results once the system is back up

## 2023-07-08 NOTE — Telephone Encounter (Signed)
 Can you please contact PCP and obtain lab results that were done 2 weeks ago    Thanks

## 2023-07-08 NOTE — Progress Notes (Signed)
 Name: [name]  MRN/ DOB: [id number], 02-18-1955    Age/ Sex: 68 y.o., female     PCP: Victor Grapes, PA-C   Reason for Endocrinology Evaluation: Graves' Disease      Initial Endocrinology Clinic Visit: 02/16/2020    PATIENT IDENTIFIER: [name] is a 68 y.o., female with a past medical history of Hx of breast ca ( S/P right lumpectomy 2018 and radiation ) and Graves' disease . She has followed with  Endocrinology clinic since 02/16/2020 for consultative assistance with management of her hyperthyroidism.   HISTORICAL SUMMARY:   She has been diagnosed with Graves' disease in 2016. Had a thyroid  uptake and scan on 08/13/2014 at 57.5 % of I-131 , uniform uptake consistent with Graves's disease. Has been on Methimazole  since her diagnosis.    SUBJECTIVE:    Today (07/08/2023):  [name] is here for a follow up on Graves disease .  She continues to follow-up with oncology for history of breast cancer   Had labs done at PCP's office , per pt she was told her TF's are normal, these records are not available  Per pt she was diagnosed with DM through her PCP's office, would like to work on lifestyle changes   Denies local neck swelling   Denies recent palpitations  Denies diarrhea but has occasional  constipation  Denies tremors   HOME ENDOCRINE MEDICATION  Methimazole  5 mg daily ,2 tabs on "Sunday and 1 tab rest of the week     HISTORY:  Past Medical History:  Past Medical History:  Diagnosis Date   ALLERGIC RHINITIS 03/30/2008   Qualifier: Diagnosis of  By: John MD, James W    Anxiety    Arthritis    neck   Breast cancer (HCC) 2018   Right Breast Cancer   Deep venous thrombosis (HCC) 01/26/2021   Dysarthria 09/22/2012   GERD (gastroesophageal reflux disease)    Graves' disease 09/03/2014   Headache(784.0) 09/22/2012   History of hiatal hernia    History of radiation therapy 02/05/2017   02/05/17-03/05/17,  right breast 40.05 Gy in 15 fractions,  right breast boost 10 Gy in 5 fractions   Hyperlipidemia    Hypertension    Hyperthyroidism    Iron deficiency anemia    LEG PAIN, RIGHT 04/22/2008   Qualifier: Diagnosis of  By: John MD, James W    Low back pain 01/26/2021   Malignant tumor of breast (HCC) 11/13/2016   Palpitation 09/24/2013   Personal history of radiation therapy 2018   Right Breast Cancer   Pulmonary emboli (HCC) 11/20/2016   Uterine fibroid    Vitamin D deficiency    Graves disease   Past Surgical History:  Past Surgical History:  Procedure Laterality Date   BREAST LUMPECTOMY Right 12/16/2016   BREAST LUMPECTOMY WITH RADIOACTIVE SEED AND SENTINEL LYMPH NODE BIOPSY Right 12/16/2016   Procedure: RIGHT BREAST LUMPECTOMY WITH RADIOACTIVE SEED AND SENTINEL LYMPH NODE BIOPSY;  Surgeon: Wakefield, Matthew, MD;  Location: [facility] OR;  Service: General;  Laterality: Right;   CHOLECYSTECTOMY     COLONOSCOPY     DILATION AND CURETTAGE OF UTERUS     12" /2009   ESOPHAGOGASTRODUODENOSCOPY     ivc filter     Social History:  reports that she quit smoking about 21 years ago. Her smoking use included cigarettes. She started smoking about 31 years ago. She has a 4 pack-year smoking history. She has never used smokeless tobacco. She reports current alcohol  use. She reports that she does not use drugs. Family History:  Family History  Problem Relation Age of Onset   Drug abuse Brother    Hypertension Mother    Colon cancer Maternal Grandfather      HOME MEDICATIONS: Allergies as of 07/08/2023       Reactions   Amoxicillin-pot Clavulanate Other (See Comments)   Dizziness   Niacin-lovastatin Er Other (See Comments)   Caused flushing        Medication List        Accurate as of Jul 08, 2023 12:49 PM. If you have any questions, ask your nurse or doctor.          anastrozole  1 MG tablet Commonly known as: ARIMIDEX  Take 1 tablet (1 mg total) by mouth daily.   aspirin  EC 81 MG tablet Take 1 tablet (81 mg total) by  mouth daily.   atorvastatin 20 MG tablet Commonly known as: LIPITOR Take 20 mg by mouth daily.   candesartan  8 MG tablet Commonly known as: Atacand  Take 1 tablet (8 mg total) by mouth daily.   hydrALAZINE  10 MG tablet Commonly known as: APRESOLINE  Take 0.5 tablets (5 mg total) by mouth as needed (for diastolic greater than 110 for up to 2 doses.).   LORazepam 0.5 MG tablet Commonly known as: ATIVAN Take 0.5 mg by mouth 2 (two) times daily as needed.   meloxicam 15 MG tablet Commonly known as: MOBIC Take 15 mg by mouth daily.   methimazole  5 MG tablet Commonly known as: TAPAZOLE  Take 1 tablet (5 mg total) by mouth as directed. Take 2 tabs on Sundays and 1 tab rest of week   OMEGA-3 FISH OIL PO Take 2,000 mg by mouth.   ondansetron  8 MG disintegrating tablet Commonly known as: ZOFRAN -ODT 1 tablet on the tongue and allow to dissolve as needed Oral Once a day As needed   vitamin C 1000 MG tablet Take 1,000 mg by mouth daily.   VITAMIN D -3 PO Take 1 capsule by mouth daily.          OBJECTIVE:   PHYSICAL EXAM: VS: BP 126/88 (BP Location: Left Arm, Patient Position: Sitting, Cuff Size: Normal)   Pulse 82   Ht 5\' 7"  (1.702 m)   Wt 191 lb (86.6 kg)   LMP 06/18/2010   SpO2 97%   BMI 29.91 kg/m     EXAM: General: Pt appears well and is in NAD  Neck: General: Supple without adenopathy. Thyroid : Thyroid  asymmetry noted on exam today. No thyroid  bruit.  Lungs: Clear with good BS bilat   Heart: Auscultation: RRR.  Abdomen: soft, nontender  Mental Status: Judgment, insight: Intact Orientation: Oriented to time, place, and person Mood and affect: No depression, anxiety, or agitation     DATA REVIEWED:     Latest Reference Range & Units 03/04/23 13:38  Bone Isoenzymes 28 - 66 % 64  Intestinal Isoenzymes 1 - 24 % 0 (L)  Liver Isoenzymes 25 - 69 % 36    Thyroid  ultrasound 05/02/2022  Estimated total number of nodules >/= 1 cm: 1   Number of spongiform  nodules >/=  2 cm not described below (TR1): 0   Number of mixed cystic and solid nodules >/= 1.5 cm not described below (TR2): 0   _________________________________________________________   Nodule # 1:   Location: Right; Inferior   Maximum size: 1.6 cm; Other 2 dimensions: 1.4 x 1.3 cm   Composition: solid/almost completely solid (2)   Echogenicity:  isoechoic (1)   Shape: not taller-than-wide (0)   Margins: ill-defined (0)   Echogenic foci: none (0)   ACR TI-RADS total points: 3.   ACR TI-RADS risk category: TR3 (3 points).   ACR TI-RADS recommendations:   *Given size (>/= 1.5 - 2.4 cm) and appearance, a follow-up ultrasound in 1 year should be considered based on TI-RADS criteria.   _________________________________________________________   IMPRESSION: 1. Diffusely enlarged and heterogeneous thyroid  gland most consistent with chronic thyroiditis or diffuse goitrous change. 2. A solitary 1.6 cm TI-RADS category 3 nodule is visualized in the right lower gland. This nodule meets criteria for imaging surveillance. Recommend follow-up ultrasound in 1 year.    ASSESSMENT / PLAN / RECOMMENDATIONS:    Hyperthyroidism secondary to Graves' Disease:    -Patient is clinically euthyroid - Has been on methimazole  since 2016 -Patient is clinically euthyroid - Per pt , TFT's were normal through PCP's office, no records at this time, will obtain    Medications : Continue  methimazole  5 mg, take 2 tablets on Sundays, 1 tablet rest of the week    2. Graves' Disease:     No extra-thyroidal manifestations of graves' disease  3.  MNG  -Thyroid  ultrasound 04/2022 revealed 1.6 cm right thyroid  nodule met criteria for surveillance - No local neck symptoms  - Up to date on thyroid  ultrasound    4.  Elevated alkaline phosphatase:  -Historically this has been fluctuating - Alk. Pho . Isozymes show 64% of bone origin, scheduled for DXA in 10/2023  5. Newly Diagnosed  DM:  - This is per pt, no A1c available, will obtain through PCP - Pt has made lifestyle changes     Follow-up in 3 months    Signed electronically by: Natale Bail, MD  [facility] Endocrinology  [facility] [address] [place], [place] [address] Phone: [phone] FAX: [phone]      CC: Marie Shone [address] [place] [address] Phone: [phone]  Fax: [phone]   Return to Endocrinology clinic as below: Future Appointments  Date Time Provider Department Center  07/08/2023  1:20 PM Matteson Blue, Julian Obey, MD [facility] None  11/04/2023  2:00 PM [facility] DX DEXA 1 [facility]  12/08/2023 11:45 AM Murleen Arms, MD [facility] None

## 2023-07-09 ENCOUNTER — Ambulatory Visit: Payer: Self-pay | Admitting: Internal Medicine

## 2023-07-15 DIAGNOSIS — R748 Abnormal levels of other serum enzymes: Secondary | ICD-10-CM | POA: Diagnosis not present

## 2023-07-15 DIAGNOSIS — R141 Gas pain: Secondary | ICD-10-CM | POA: Diagnosis not present

## 2023-07-15 DIAGNOSIS — K219 Gastro-esophageal reflux disease without esophagitis: Secondary | ICD-10-CM | POA: Diagnosis not present

## 2023-07-15 DIAGNOSIS — K5909 Other constipation: Secondary | ICD-10-CM | POA: Diagnosis not present

## 2023-08-07 ENCOUNTER — Emergency Department (HOSPITAL_COMMUNITY)

## 2023-08-07 ENCOUNTER — Emergency Department (HOSPITAL_COMMUNITY): Admission: EM | Admit: 2023-08-07 | Discharge: 2023-08-07 | Disposition: A | Discharge status: Home / Self Care

## 2023-08-07 ENCOUNTER — Other Ambulatory Visit: Payer: Self-pay

## 2023-08-07 DIAGNOSIS — R110 Nausea: Secondary | ICD-10-CM | POA: Diagnosis not present

## 2023-08-07 DIAGNOSIS — R0989 Other specified symptoms and signs involving the circulatory and respiratory systems: Secondary | ICD-10-CM | POA: Diagnosis not present

## 2023-08-07 DIAGNOSIS — I517 Cardiomegaly: Secondary | ICD-10-CM | POA: Diagnosis not present

## 2023-08-07 DIAGNOSIS — R001 Bradycardia, unspecified: Secondary | ICD-10-CM | POA: Diagnosis not present

## 2023-08-07 DIAGNOSIS — Z7982 Long term (current) use of aspirin: Secondary | ICD-10-CM | POA: Diagnosis not present

## 2023-08-07 DIAGNOSIS — R55 Syncope and collapse: Secondary | ICD-10-CM | POA: Insufficient documentation

## 2023-08-07 DIAGNOSIS — I771 Stricture of artery: Secondary | ICD-10-CM | POA: Diagnosis not present

## 2023-08-07 DIAGNOSIS — W19XXXA Unspecified fall, initial encounter: Secondary | ICD-10-CM | POA: Diagnosis not present

## 2023-08-07 DIAGNOSIS — R42 Dizziness and giddiness: Secondary | ICD-10-CM | POA: Diagnosis not present

## 2023-08-07 LAB — CBC WITH DIFFERENTIAL/PLATELET
Abs Immature Granulocytes: 0.03 10*3/uL (ref 0.00–0.07)
Basophils Absolute: 0 10*3/uL (ref 0.0–0.1)
Basophils Relative: 1 %
Eosinophils Absolute: 0.1 10*3/uL (ref 0.0–0.5)
Eosinophils Relative: 1 %
HCT: 37.4 % (ref 36.0–46.0)
Hemoglobin: 12.1 g/dL (ref 12.0–15.0)
Immature Granulocytes: 1 %
Lymphocytes Relative: 16 %
Lymphs Abs: 0.9 10*3/uL (ref 0.7–4.0)
MCH: 32.7 pg (ref 26.0–34.0)
MCHC: 32.4 g/dL (ref 30.0–36.0)
MCV: 101.1 fL — ABNORMAL HIGH (ref 80.0–100.0)
Monocytes Absolute: 0.5 10*3/uL (ref 0.1–1.0)
Monocytes Relative: 9 %
Neutro Abs: 4.1 10*3/uL (ref 1.7–7.7)
Neutrophils Relative %: 72 %
Platelets: 125 10*3/uL — ABNORMAL LOW (ref 150–400)
RBC: 3.7 MIL/uL — ABNORMAL LOW (ref 3.87–5.11)
RDW: 12.4 % (ref 11.5–15.5)
WBC: 5.6 10*3/uL (ref 4.0–10.5)
nRBC: 0 % (ref 0.0–0.2)

## 2023-08-07 LAB — BASIC METABOLIC PANEL WITH GFR
Anion gap: 7 (ref 5–15)
BUN: 17 mg/dL (ref 8–23)
CO2: 23 mmol/L (ref 22–32)
Calcium: 9 mg/dL (ref 8.9–10.3)
Chloride: 107 mmol/L (ref 98–111)
Creatinine, Ser: 0.96 mg/dL (ref 0.44–1.00)
GFR, Estimated: >60 mL/min (ref >60)
Glucose, Bld: 150 mg/dL — ABNORMAL HIGH (ref 70–99)
Potassium: 3.4 mmol/L — ABNORMAL LOW (ref 3.5–5.1)
Sodium: 137 mmol/L (ref 135–145)

## 2023-08-07 LAB — CBG MONITORING, ED: Glucose-Capillary: 150 mg/dL — ABNORMAL HIGH (ref 70–99)

## 2023-08-07 NOTE — ED Triage Notes (Signed)
 BM and had onset of weakness and dizziness. Denies N/V, no trouble eating. 1st EMS vitals 70/40 BP 46 HR CBG 125.

## 2023-08-07 NOTE — Discharge Instructions (Signed)
 Please follow-up with your primary doctor.  Return immediately if develop fevers, chills, palpitations, chest pain, shortness of breath, passout or develop any new or worsening symptoms that are concerning to you.

## 2023-08-07 NOTE — ED Provider Notes (Signed)
 [place] EMERGENCY DEPARTMENT AT [facility] Provider Note   CSN: [id number] Arrival date & time: 08/07/23  1055     Patient presents with: Weakness and Hypotension   [name] is a 68 y.o. female.   68 year old female presenting emergency department for near syncopal episode.  Was getting her hair done when she went to the bathroom had a bowel movement.  Shortly after felt lightheaded and like she was going to pass out.  No nausea no vomiting.  Is feeling improved currently without symptoms.  Denies chest pain or shortness of breath prior to episode.   Weakness      Prior to Admission medications   Medication Sig Start Date End Date Taking? Authorizing Provider  anastrozole  (ARIMIDEX ) 1 MG tablet Take 1 tablet (1 mg total) by mouth daily. 04/17/22   Iruku, Praveena, MD  Ascorbic Acid (VITAMIN C) 1000 MG tablet Take 1,000 mg by mouth daily.    [provider]  aspirin  EC 81 MG tablet Take 1 tablet (81 mg total) by mouth daily. 10/07/17   Lavona Agent, MD  atorvastatin (LIPITOR) 20 MG tablet Take 20 mg by mouth daily. 03/10/21   [provider]  candesartan  (ATACAND ) 8 MG tablet Take 1 tablet (8 mg total) by mouth daily. 12/18/20   Lavona Agent, MD  Cholecalciferol (VITAMIN D -3 PO) Take 1 capsule by mouth daily.    [provider]  hydrALAZINE  (APRESOLINE ) 10 MG tablet Take 0.5 tablets (5 mg total) by mouth as needed (for diastolic greater than 110 for up to 2 doses.). 12/28/20   Lavona Agent, MD  LORazepam (ATIVAN) 0.5 MG tablet Take 0.5 mg by mouth 2 (two) times daily as needed. 12/29/20   [provider]  meloxicam (MOBIC) 15 MG tablet Take 15 mg by mouth daily. 04/24/23   [provider]  methimazole  (TAPAZOLE ) 5 MG tablet Take 1 tablet (5 mg total) by mouth as directed. Take 2 tabs on Sundays and 1 tab rest of week 03/05/23   Shamleffer, Ibtehal Jaralla, MD  Omega-3 Fatty Acids (OMEGA-3 FISH OIL PO) Take 2,000 mg  by mouth.    [provider]  ondansetron  (ZOFRAN -ODT) 8 MG disintegrating tablet 1 tablet on the tongue and allow to dissolve as needed Oral Once a day As needed 05/28/22   [provider]    Allergies: Amoxicillin-pot clavulanate and Niacin-lovastatin er    Review of Systems  Neurological:  Positive for weakness.    Updated Vital Signs BP 121/74 (BP Location: Right Arm)   Pulse 61   Temp 97.8 F (36.6 C) (Oral)   Resp 16   Ht 5' 7 (1.702 m)   Wt 86.6 kg   LMP 06/18/2010   SpO2 99%   BMI 29.90 kg/m   Physical Exam Vitals and nursing note reviewed.  Constitutional:      General: She is not in acute distress.    Appearance: She is not toxic-appearing.  HENT:     Head: Normocephalic.     Nose: Nose normal.     Mouth/Throat:     Mouth: Mucous membranes are moist.   Eyes:     Conjunctiva/sclera: Conjunctivae normal.    Cardiovascular:     Rate and Rhythm: Normal rate and regular rhythm.  Pulmonary:     Effort: Pulmonary effort is normal.     Breath sounds: Normal breath sounds.  Abdominal:     General: Abdomen is flat. There is no distension.  Tenderness: There is no abdominal tenderness. There is no guarding or rebound.   Musculoskeletal:        General: Normal range of motion.   Skin:    General: Skin is warm and dry.     Capillary Refill: Capillary refill takes less than 2 seconds.   Neurological:     Mental Status: She is alert and oriented to person, place, and time.   Psychiatric:        Mood and Affect: Mood normal.        Behavior: Behavior normal.     (all labs ordered are listed, but only abnormal results are displayed) Labs Reviewed  BASIC METABOLIC PANEL WITH GFR - Abnormal; Notable for the following components:      Result Value   Potassium 3.4 (*)    Glucose, Bld 150 (*)    All other components within normal limits  CBC WITH DIFFERENTIAL/PLATELET - Abnormal; Notable for the following components:   RBC 3.70 (*)     MCV 101.1 (*)    Platelets 125 (*)    All other components within normal limits  CBG MONITORING, ED - Abnormal; Notable for the following components:   Glucose-Capillary 150 (*)    All other components within normal limits    EKG: EKG Interpretation Date/Time:  Thursday August 07 2023 11:45:36 EDT Ventricular Rate:  61 PR Interval:  150 QRS Duration:  105 QT Interval:  395 QTC Calculation: 398 R Axis:   35  Text Interpretation: Sinus rhythm Abnormal R-wave progression, early transition Borderline T abnormalities, anterior leads Confirmed by Neysa Clap [id number]) on 08/07/2023 1:22:08 PM  Radiology: DG Chest 2 View Result Date: 08/07/2023 CLINICAL DATA:  Syncope EXAM: CHEST - 2 VIEW COMPARISON:  Chest x-ray 04/01/2023 FINDINGS: Enlarged cardiopericardial silhouette. Calcified tortuous aorta. Vascular congestion. No pneumothorax or effusion. No edema. Overlapping cardiac leads. Degenerative changes of the spine. Artifact from the patient's clothing. IMPRESSION: Enlarged heart with some vascular congestion. Electronically Signed   By: Ranell Bring M.D.   On: 08/07/2023 12:09     Procedures   Medications Ordered in the ED - No data to display                                  Medical Decision Making This is a 68 year old female presenting emergency department for what sounds like vasovagal/micturition near syncope.  She is afebrile nontachycardic, normotensive here.  EMS reported blood pressure 70/40 with low heart rate and 46 bpm.  Patient asymptomatic currently.  Workup reassuring.  Borderline potassium, normal renal function.  No anemia.  EKG appears to be normal sinus rhythm with normal intervals, no QTc prolongation no ST segment changes to indicate ischemia my independent interpretation.  Chest x-ray without pneumonia pneumothorax on my independent review.  Discussed supportive measures and follow-up with primary doctor.  Return precautions given.  Amount and/or Complexity of Data  Reviewed External Data Reviewed:     Details: Does have a history of breast cancer, considered PE, however otherwise low risk, not having chest pain shortness of breath or hypoxia.  No tachycardia.  Low suspicion symptoms secondary to clot Labs: ordered. Radiology: ordered.  Risk Decision regarding hospitalization.      Final diagnoses:  None    ED Discharge Orders     None          Neysa Clap PARAS, DO 08/07/23 1324

## 2023-08-11 ENCOUNTER — Telehealth: Payer: Self-pay | Admitting: Cardiology

## 2023-08-11 ENCOUNTER — Encounter (HOSPITAL_BASED_OUTPATIENT_CLINIC_OR_DEPARTMENT_OTHER): Payer: Self-pay

## 2023-08-11 NOTE — Telephone Encounter (Signed)
 Patient stated that she had a near syncope after using the bathroom on Thursday at the hair salon. EMS was called and patient's BP was very low, she was taken to [facility] and discharged shortly after due to  BP coming back up. Today patient's BP was around 130's/80's. Patient stated she stopped taking her candesartan  because of the near syncope. Patient has an appointment to see APP this Thursday. Encouraged patient to keep a log of her BP's. Informed patient about our BP clinic with the PharmD, and patient is interested in doing that. Informed patient that we will check with Dr. Lavona and see if we can place a referral with Hypertension clinic.

## 2023-08-11 NOTE — Progress Notes (Unsigned)
   Patient Name: [name]  DOB: April 02, 1955 MRN: [id number]  Primary Cardiologist: Lynwood Schilling, MD  [name] is a 68 y.o. female with visit-pertinent history of palpitations, precordial chest pain, bruit, DVT/PE and IVC filter.  Patient has a remote history of a treadmill stress test in [place]  and underwent treadmill stress test with [facility] which was negative.  Patient was previously on propranolol  for palpitations. Patient presented today for an emergency room follow-up visit for a near syncopal episode.  On 08/07/2023 patient presented to the emergency department for a near syncopal episode.  She reported she was getting her hair done when she went to the bathroom and had a bowel movement, shortly after she felt lightheaded and like she was going to pass out.  Denied any nausea or vomiting.  On evaluation the ED she reported that she felt significantly better.  She denied any chest pain or shortness of breath prior to the episode.  It was felt to be vasovagal/micturition near syncope, EMS reported blood pressure 70/40 with low heart rate at 46 bpm.  EKG showed normal sinus rhythm with normal intervals, no QTc prolongation and no ST segment changes.  Chest x-ray with no pneumonia or pneumothorax.  Sodium 137, potassium 3.4, creatinine 0.96, hemoglobin 12.1, hematocrit 37.4.  Patient was discharged with recommended follow-up with cardiology.  Patient presented today for follow-up given her recent near syncopal episodes.  Prior to evaluation patient reported that she had gone to the bathroom and had a bowel movement resulting in another near syncopal episode, she denied any true syncope.  Per medical assistance she was extremely pale and reported increased fatigue.  I was asked to come in to evaluate the patient, at that time her blood pressure was 138/86, heart rate 80, she reported that she was feeling slightly improved however just overall felt unwell and felt that she needed to be  evaluated in the emergency department.  Patient elected to not proceed with appointment today as she intended to present to the emergency department for further evaluation regarding ongoing GI problems and increased lower abdominal pain.  Will no charge patient for visit today, directed patient to follow-up with cardiology after emergency room evaluation.  Yesena Reaves D Adhrit Krenz, NP 08/14/2023, 2:39 PM

## 2023-08-11 NOTE — Telephone Encounter (Signed)
 Pt c/o BP issue: STAT if pt c/o blurred vision, one-sided weakness or slurred speech.  STAT if BP is GREATER than 180/120 TODAY.  STAT if BP is LESS than 90/60 and SYMPTOMATIC TODAY  1. What is your BP concern?  Patient is concerned her BP has been trending low  2. Have you taken any BP medication today?  Not taken today  3. What are your last 5 BP readings?  Not available  4. Are you having any other symptoms (ex. Dizziness, headache, blurred vision, passed out)?   Lightheaded (patient noted she has not eaten today)   Patient stated she stopped taking her candesartan  (ATACAND ) 8 MG tablet as her BP has been trending low.  Patient wants a call back to discuss next steps.

## 2023-08-12 NOTE — Telephone Encounter (Signed)
 Spoke with pt and advised per Dr Lavona pt should plan to keep 08/14/23 appointment with Katlyn West, NP.  Pt verbalizes understanding and agrees with current plan.

## 2023-08-14 ENCOUNTER — Emergency Department (HOSPITAL_COMMUNITY)
Admission: EM | Admit: 2023-08-14 | Discharge: 2023-08-14 | Disposition: A | Discharge status: Home / Self Care | Attending: Emergency Medicine | Admitting: Emergency Medicine

## 2023-08-14 ENCOUNTER — Emergency Department (HOSPITAL_COMMUNITY)

## 2023-08-14 ENCOUNTER — Ambulatory Visit: Attending: Cardiology | Admitting: Cardiology

## 2023-08-14 ENCOUNTER — Encounter (HOSPITAL_COMMUNITY): Payer: Self-pay | Admitting: Emergency Medicine

## 2023-08-14 ENCOUNTER — Encounter: Payer: Self-pay | Admitting: Cardiology

## 2023-08-14 VITALS — BP 138/86 | HR 80 | Ht 67.0 in | Wt 182.0 lb

## 2023-08-14 DIAGNOSIS — K449 Diaphragmatic hernia without obstruction or gangrene: Secondary | ICD-10-CM | POA: Diagnosis not present

## 2023-08-14 DIAGNOSIS — R110 Nausea: Secondary | ICD-10-CM | POA: Diagnosis not present

## 2023-08-14 DIAGNOSIS — R1032 Left lower quadrant pain: Secondary | ICD-10-CM | POA: Diagnosis not present

## 2023-08-14 DIAGNOSIS — Z7982 Long term (current) use of aspirin: Secondary | ICD-10-CM | POA: Insufficient documentation

## 2023-08-14 DIAGNOSIS — K838 Other specified diseases of biliary tract: Secondary | ICD-10-CM | POA: Diagnosis not present

## 2023-08-14 DIAGNOSIS — K429 Umbilical hernia without obstruction or gangrene: Secondary | ICD-10-CM | POA: Diagnosis not present

## 2023-08-14 DIAGNOSIS — I10 Essential (primary) hypertension: Secondary | ICD-10-CM | POA: Diagnosis not present

## 2023-08-14 DIAGNOSIS — K5730 Diverticulosis of large intestine without perforation or abscess without bleeding: Secondary | ICD-10-CM | POA: Diagnosis not present

## 2023-08-14 DIAGNOSIS — R55 Syncope and collapse: Secondary | ICD-10-CM

## 2023-08-14 LAB — CBC WITH DIFFERENTIAL/PLATELET
Abs Immature Granulocytes: 0.02 10*3/uL (ref 0.00–0.07)
Basophils Absolute: 0 10*3/uL (ref 0.0–0.1)
Basophils Relative: 0 %
Eosinophils Absolute: 0 10*3/uL (ref 0.0–0.5)
Eosinophils Relative: 1 %
HCT: 39.2 % (ref 36.0–46.0)
Hemoglobin: 12.6 g/dL (ref 12.0–15.0)
Immature Granulocytes: 0 %
Lymphocytes Relative: 19 %
Lymphs Abs: 1 10*3/uL (ref 0.7–4.0)
MCH: 31.6 pg (ref 26.0–34.0)
MCHC: 32.1 g/dL (ref 30.0–36.0)
MCV: 98.2 fL (ref 80.0–100.0)
Monocytes Absolute: 0.3 10*3/uL (ref 0.1–1.0)
Monocytes Relative: 5 %
Neutro Abs: 3.8 10*3/uL (ref 1.7–7.7)
Neutrophils Relative %: 75 %
Platelets: 144 10*3/uL — ABNORMAL LOW (ref 150–400)
RBC: 3.99 MIL/uL (ref 3.87–5.11)
RDW: 12.4 % (ref 11.5–15.5)
WBC: 5.1 10*3/uL (ref 4.0–10.5)
nRBC: 0 % (ref 0.0–0.2)

## 2023-08-14 LAB — COMPREHENSIVE METABOLIC PANEL WITH GFR
ALT: 22 U/L (ref 0–44)
AST: 20 U/L (ref 15–41)
Albumin: 4.3 g/dL (ref 3.5–5.0)
Alkaline Phosphatase: 162 U/L — ABNORMAL HIGH (ref 38–126)
Anion gap: 9 (ref 5–15)
BUN: 13 mg/dL (ref 8–23)
CO2: 24 mmol/L (ref 22–32)
Calcium: 9.3 mg/dL (ref 8.9–10.3)
Chloride: 107 mmol/L (ref 98–111)
Creatinine, Ser: 0.96 mg/dL (ref 0.44–1.00)
GFR, Estimated: >60 mL/min (ref >60)
Glucose, Bld: 109 mg/dL — ABNORMAL HIGH (ref 70–99)
Potassium: 3.9 mmol/L (ref 3.5–5.1)
Sodium: 140 mmol/L (ref 135–145)
Total Bilirubin: 0.8 mg/dL (ref 0.0–1.2)
Total Protein: 8.5 g/dL — ABNORMAL HIGH (ref 6.5–8.1)

## 2023-08-14 LAB — URINALYSIS, W/ REFLEX TO CULTURE (INFECTION SUSPECTED)
Bacteria, UA: NONE SEEN
Bilirubin Urine: NEGATIVE
Glucose, UA: NEGATIVE mg/dL
Hgb urine dipstick: NEGATIVE
Ketones, ur: NEGATIVE mg/dL
Leukocytes,Ua: NEGATIVE
Nitrite: NEGATIVE
Protein, ur: NEGATIVE mg/dL
Specific Gravity, Urine: 1.003 — ABNORMAL LOW (ref 1.005–1.030)
pH: 6 (ref 5.0–8.0)

## 2023-08-14 LAB — LIPASE, BLOOD: Lipase: 35 U/L (ref 11–51)

## 2023-08-14 MED ORDER — DICYCLOMINE HCL 20 MG PO TABS: 20.0000 mg | ORAL_TABLET | Freq: Three times a day (TID) | ORAL | 0 refills | Status: AC | PRN

## 2023-08-14 MED ORDER — ONDANSETRON 4 MG PO TBDP: 4.0000 mg | ORAL_TABLET | Freq: Three times a day (TID) | ORAL | 0 refills | Status: DC | PRN

## 2023-08-14 MED ORDER — IOHEXOL 300 MG/ML  SOLN
100.0000 mL | Freq: Once | INTRAMUSCULAR | Status: AC | PRN
  Administered 2023-08-14: 100 mL via INTRAVENOUS

## 2023-08-14 MED ORDER — PANTOPRAZOLE SODIUM 20 MG PO TBEC
20.0000 mg | DELAYED_RELEASE_TABLET | Freq: Every day | ORAL | 0 refills | Status: AC
Start: 2023-08-14 — End: 2023-11-03

## 2023-08-14 MED ORDER — SODIUM CHLORIDE 0.9 % IV BOLUS
500.0000 mL | Freq: Once | INTRAVENOUS | Status: AC
  Administered 2023-08-14: 500 mL via INTRAVENOUS

## 2023-08-14 MED ORDER — PANTOPRAZOLE SODIUM 40 MG IV SOLR
40.0000 mg | Freq: Once | INTRAVENOUS | Status: AC
  Administered 2023-08-14: 40 mg via INTRAVENOUS
  Filled 2023-08-14: qty 10

## 2023-08-14 MED ORDER — ONDANSETRON HCL 4 MG/2ML IJ SOLN
4.0000 mg | Freq: Once | INTRAMUSCULAR | Status: AC
  Administered 2023-08-14: 4 mg via INTRAVENOUS
  Filled 2023-08-14: qty 2

## 2023-08-14 NOTE — ED Notes (Signed)
EDP at [facility]. Family at [facility].  

## 2023-08-14 NOTE — ED Notes (Signed)
 Up to b/r, steady gait, then to CT

## 2023-08-14 NOTE — ED Provider Notes (Signed)
 [place] [facility] AT [facility] Provider Note   CSN: [id number] Arrival date & time: 08/14/23  1019     Patient presents with: Abdominal Pain   [name] is a 68 y.o. female.   Pt came in with a complain of left lower abdominal discomfort that occurs every time she has a bowel movement.   She has a PMH of: Recent vasovagal/micturition syncopal episode last week, GERD, HTN, dyslipidemia, cholecystectomy done years ago  Her abdominal discomfort started 1-2 weeks back and its causing her to be very fatigued. This has not happened with her before. Pt denies any fevers, chills, bloody stools, difficulty defecating/urinating, throwing up. She endorses nausea and had sore throat and congestion a week back. Pt mentioned having occasional discomfort in her epigastric region which goes away when she has antacids. She said her BM are lot looser now since she started taking a vegetable juice for constipation--but she wouldn't classify them as diarrhea. Pt said she had her last colonoscopy done in 2021 with no polyps or abnormal results.    Abdominal Pain      Prior to Admission medications   Medication Sig Start Date End Date Taking? Authorizing Provider  anastrozole  (ARIMIDEX ) 1 MG tablet Take 1 tablet (1 mg total) by mouth daily. 04/17/22   Iruku, Praveena, MD  Ascorbic Acid (VITAMIN C) 1000 MG tablet Take 1,000 mg by mouth daily.    [provider]  aspirin  EC 81 MG tablet Take 1 tablet (81 mg total) by mouth daily. Patient not taking: Reported on 08/14/2023 10/07/17   Lavona Agent, MD  atorvastatin (LIPITOR) 20 MG tablet Take 20 mg by mouth daily. Patient not taking: Reported on 08/14/2023 03/10/21   [provider]  candesartan  (ATACAND ) 8 MG tablet Take 1 tablet (8 mg total) by mouth daily. Patient not taking: Reported on 08/14/2023 12/18/20   Lavona Agent, MD  Cholecalciferol (VITAMIN D -3 PO) Take 1 capsule by mouth daily.    [provider]  hydrALAZINE  (APRESOLINE ) 10 MG tablet Take 0.5 tablets (5 mg total) by mouth as needed (for diastolic greater than 110 for up to 2 doses.). Patient not taking: Reported on 08/14/2023 12/28/20   Lavona Agent, MD  LORazepam (ATIVAN) 0.5 MG tablet Take 0.5 mg by mouth 2 (two) times daily as needed. 12/29/20   [provider]  meloxicam (MOBIC) 15 MG tablet Take 15 mg by mouth daily. 04/24/23   [provider]  methimazole  (TAPAZOLE ) 5 MG tablet Take 1 tablet (5 mg total) by mouth as directed. Take 2 tabs on Sundays and 1 tab rest of week 03/05/23   Shamleffer, Ibtehal Jaralla, MD  Omega-3 Fatty Acids (OMEGA-3 FISH OIL PO) Take 2,000 mg by mouth. Patient not taking: Reported on 08/14/2023    [provider]  ondansetron  (ZOFRAN -ODT) 8 MG disintegrating tablet 1 tablet on the tongue and allow to dissolve as needed Oral Once a day As needed 05/28/22   [provider]    Allergies: Amoxicillin-pot clavulanate and Niacin-lovastatin er    Review of Systems  Constitutional:        All pertinent ROS in HPI  Gastrointestinal:  Positive for abdominal pain.    Updated Vital Signs BP (!) 158/92   Pulse 63   Temp 98.1 F (36.7 C)   Resp 17   LMP 06/18/2010   SpO2 98%   Physical Exam HENT:     Head: Normocephalic.  Cardiovascular:     Rate and  Rhythm: Normal rate and regular rhythm.     Heart sounds: Normal heart sounds.  Pulmonary:     Effort: Pulmonary effort is normal.     Breath sounds: Normal breath sounds.  Abdominal:     Palpations: Abdomen is soft. There is no mass or pulsatile mass.     Tenderness: There is no guarding.     Comments: Pt did not have any tenderness on palpation.   Neurological:     Mental Status: She is alert.     (all labs ordered are listed, but only abnormal results are displayed) Labs Reviewed  URINALYSIS, W/ REFLEX TO CULTURE (INFECTION SUSPECTED) - Abnormal; Notable for the following components:       Result Value   Color, Urine COLORLESS (*)    Specific Gravity, Urine 1.003 (*)    All other components within normal limits  COMPREHENSIVE METABOLIC PANEL WITH GFR  LIPASE, BLOOD  CBC WITH DIFFERENTIAL/PLATELET    EKG: None  Radiology: No results found.   Procedures   Medications Ordered in the ED  sodium chloride  0.9 % bolus 500 mL (has no administration in time range)  pantoprazole  (PROTONIX ) injection 40 mg (has no administration in time range)                                    Medical Decision Making Pt came in with left lower abdominal discomfort with some occasional epigastric discomfort. Basic lab work was ordered as a work up for diverticulitis, gastritis, pancreatitis. CT abdomen & pelvis was unremarkable. Alk phos was elevated but that's how the levels have been in the past and she will be visiting her GI for further work up as she has fatty liver.   Pt was feeling better after receiving Protonix , Zofran  and IV in the ED. She said she felt a bit tired but not as much as before. Pt was discharged with Zofran , Protonix , and bentyl  for her nausea and abdominal pain. She was asked to f/u with her PCP and GI specialist regarding her abdominal discomfort.   Amount and/or Complexity of Data Reviewed Labs: ordered. Radiology: ordered.  Risk Prescription drug management.        Final diagnoses:  None    ED Discharge Orders     None          Edgardo Pontiff, DO 08/14/23 1501    Darra Fonda MATSU, MD 08/15/23 [phone]

## 2023-08-14 NOTE — ED Notes (Signed)
 Ambulatory to b/r, steady gait, reports urine sample previously given in triage.

## 2023-08-14 NOTE — ED Notes (Signed)
 Lab notified of urine sent and orders in

## 2023-08-14 NOTE — ED Triage Notes (Signed)
 Pt arriving POV with concern with lower abd discomfort which occurs each time she attempts to have a bowel movement. Pt reports her GI provider informed her that her liver enzymes were elevated and she is scheduled to have a biopsy.

## 2023-08-14 NOTE — Discharge Instructions (Signed)

## 2023-08-19 DIAGNOSIS — R748 Abnormal levels of other serum enzymes: Secondary | ICD-10-CM | POA: Diagnosis not present

## 2023-08-21 ENCOUNTER — Telehealth: Payer: Self-pay | Admitting: Internal Medicine

## 2023-08-21 DIAGNOSIS — E0590 Thyrotoxicosis, unspecified without thyrotoxic crisis or storm: Secondary | ICD-10-CM

## 2023-08-21 NOTE — Telephone Encounter (Signed)
 Patient called and requested to have thyroid  labs done. Advises that she is not feeling well and that the symptoms she is experiencing feel like her levels are off. Call back number is [phone]

## 2023-08-22 ENCOUNTER — Other Ambulatory Visit

## 2023-08-22 DIAGNOSIS — E0590 Thyrotoxicosis, unspecified without thyrotoxic crisis or storm: Secondary | ICD-10-CM | POA: Diagnosis not present

## 2023-08-22 LAB — T3, FREE: T3, Free: 3.4 pg/mL (ref 2.3–4.2)

## 2023-08-22 LAB — T4, FREE: Free T4: 1.4 ng/dL (ref 0.8–1.8)

## 2023-08-22 LAB — TSH: TSH: 1.9 m[IU]/L (ref 0.40–4.50)

## 2023-08-22 NOTE — Telephone Encounter (Signed)
 Patient feels off balance and very anxious. She states she normally feels this way when her thyroid  levels are off. Would like to have labs done.

## 2023-08-22 NOTE — Telephone Encounter (Signed)
 Patient scheduled for this afternoon.

## 2023-08-23 NOTE — Progress Notes (Addendum)
 Cardiology Office Note   Date:  08/23/2023  ID:  [name], [name] 1955/09/08, MRN [id number] PCP: Sherre Geni LABOR, NP  [facility] Providers Cardiologist:  Lynwood Schilling, MD {    History of Present Illness [name] is a 68 y.o. female with a past medical history of recent vasovagal/micturition syncopal episode at the end of June, GERD, hypertension, dyslipidemia, cholecystectomy done years ago who was recently in the ER for abdominal pain.  She stated that she was experiencing left lower abdominal discomfort every time she had a bowel movement.  This discomfort started about 1 to 2 weeks prior and caused her to be very fatigued.  Had not happened before.  Denied fevers, chills, bloody stools, difficulty urinating/defecating, and throwing up.  Endorsed nausea and had a sore throat and congestion a week prior.  Occasional epigastric discomfort which she takes antacids for.  She was last scheduled to be seen in our office 7/3 for a near syncopal episode.  Prior to that evaluation she reported she had gone to the bathroom and had a bowel movement resulting in another syncopal episode.  Denied true syncope.  Per medical assistant she was extremely pale and reported increased fatigue.  Blood pressure was 138/86 with heart rate of 80 bpm at the time of her office visit and she was feeling slightly improved however overall unwell.  She ultimately decided not to come to the office that day because she instead presented to the ER for further evaluation of her abdominal pain.  History includes bruit, precordial chest pain, palpitations, DVT/PE and IVC filter.  Remote history of treadmill stress test in [place]  and underwent treadmill stress test with [facility] which was negative.  Patient was previously on propranolol  for palpitations.  Patient last seen in the ER for syncopal episode 08/07/2023.  Today, she presents with experienced a syncopal episode late June while at a salon, following a bowel  movement. Her blood pressure was recorded below 60/50 at the time, improving after lying down and during ambulance transport.  She has hypertension and hyperlipidemia. She stopped her blood pressure medication due to normal readings, with the highest recent reading being 140/90. She uses hydralazine  as needed if her diastolic blood pressure exceeds 85. She discontinued her cholesterol medication due to concerns about liver enzyme levels.  She has a history of DVT and pulmonary embolism, treated with Lovenox, and has an IVC filter in place. A chronic blood clot is stable and not expected to move.  Reports no shortness of breath nor dyspnea on exertion. Reports no chest pain, pressure, or tightness. No edema, orthopnea, PND. Reports no palpitations.   Discussed the use of AI scribe software for clinical note transcription with the patient, who gave verbal consent to proceed.   ROS: pertinent ROS in HPI   Studies Reviewed      No recent monitor  Reviewed recent EKG from ER visit  Physical Exam VS:  LMP 06/18/2010        Wt Readings from Last 3 Encounters:  08/14/23 182 lb (82.6 kg)  08/07/23 190 lb 14.7 oz (86.6 kg)  07/08/23 191 lb (86.6 kg)    GEN: Well nourished, well developed in no acute distress NECK: No JVD; No carotid bruits CARDIAC: RRR, no murmurs, rubs, gallops RESPIRATORY:  Clear to auscultation without rales, wheezing or rhonchi  ABDOMEN: Soft, non-tender, non-distended EXTREMITIES:  No edema; No deformity   ASSESSMENT AND PLAN  Near syncope - Likely due to hypotension, exacerbated  blood pressure occasionally with low-salt diet -Discontinue candesartan  -Use hydralazine  0.5 tablet daily for diastolic BP greater than 85 mmHg  HTN -bottom number above 85  - Monitor blood pressure regularly Use hydralazine  0.5 mg tablets as needed   Elevated A1c Improved glycemic control with dietary modifications, avoiding glucose usually 119 to 108 mg/dL  Fatty liver Fatty  liver with previous elevated liver enzymes, awaiting biopsy for further evaluation - Proceed with liver biopsy as recommended by GI specialist - Obtain updated liver panel results - Resume statin but continue to hold NSAIDs  GERD Symptoms managed with medication Follow-up with GI specialist  HLD - Continue statin therapy - Update LFTs  DVT/PE with IVC filter -Now with chronic DVT which has been stable, no anticoag at this time     Dispo: She will follow-up in 6 months with Dr. Lavona  Signed, [name], PA-C

## 2023-08-25 ENCOUNTER — Ambulatory Visit: Payer: Self-pay | Admitting: Internal Medicine

## 2023-08-26 ENCOUNTER — Encounter: Payer: Self-pay | Admitting: Physician Assistant

## 2023-08-26 ENCOUNTER — Ambulatory Visit: Attending: Physician Assistant | Admitting: Physician Assistant

## 2023-08-26 VITALS — BP 138/78 | HR 83 | Ht 67.0 in | Wt 179.0 lb

## 2023-08-26 DIAGNOSIS — R079 Chest pain, unspecified: Secondary | ICD-10-CM | POA: Diagnosis not present

## 2023-08-26 DIAGNOSIS — K219 Gastro-esophageal reflux disease without esophagitis: Secondary | ICD-10-CM | POA: Diagnosis not present

## 2023-08-26 DIAGNOSIS — I10 Essential (primary) hypertension: Secondary | ICD-10-CM

## 2023-08-26 DIAGNOSIS — E785 Hyperlipidemia, unspecified: Secondary | ICD-10-CM

## 2023-08-26 DIAGNOSIS — R748 Abnormal levels of other serum enzymes: Secondary | ICD-10-CM | POA: Diagnosis not present

## 2023-08-26 DIAGNOSIS — R55 Syncope and collapse: Secondary | ICD-10-CM | POA: Diagnosis not present

## 2023-08-26 DIAGNOSIS — Z79899 Other long term (current) drug therapy: Secondary | ICD-10-CM

## 2023-08-26 DIAGNOSIS — K5904 Chronic idiopathic constipation: Secondary | ICD-10-CM | POA: Diagnosis not present

## 2023-08-26 DIAGNOSIS — R140 Abdominal distension (gaseous): Secondary | ICD-10-CM | POA: Diagnosis not present

## 2023-08-26 DIAGNOSIS — R002 Palpitations: Secondary | ICD-10-CM

## 2023-08-26 MED ORDER — ATORVASTATIN CALCIUM 20 MG PO TABS
20.0000 mg | ORAL_TABLET | Freq: Every day | ORAL | 2 refills | Status: DC
Start: 2023-08-26 — End: 2023-12-19

## 2023-08-26 MED ORDER — HYDRALAZINE HCL 10 MG PO TABS: 5.0000 mg | ORAL_TABLET | ORAL | 4 refills | Status: AC | PRN

## 2023-08-26 NOTE — Patient Instructions (Signed)
 Medication Instructions:   STOP CANDESARTAN    RESTART TAKING YOUR ATORVASTATIN  (LIPITOR) 20 MG BY MOUTH DAILY  TAKE HYDRALAZINE   0.5 tablets (5 mg total) by mouth as needed (for diastolic greater than 85 for up to 2 doses).  *If you need a refill on your cardiac medications before your next appointment, please call your pharmacy*   Lab Work:  IN 6 WEEKS AT ANY [facility] NEAR YOU OR WITH YOUR PCP--LFTs  If you have labs (blood work) drawn today and your tests are completely normal, you will receive your results only by: MyChart Message (if you have MyChart) OR A paper copy in the mail If you have any lab test that is abnormal or we need to change your treatment, we will call you to review the results.   Follow-Up: At [facility], you and your health needs are our priority.  As part of our continuing mission to provide you with exceptional heart care, our providers are all part of one team.  This team includes your primary Cardiologist (physician) and Advanced Practice Providers or APPs (Physician Assistants and Nurse Practitioners) who all work together to provide you with the care you need, when you need it.  Your next appointment:   6 month(s)  Provider:   Lynwood Schilling, MD

## 2023-08-27 ENCOUNTER — Telehealth: Payer: Self-pay | Admitting: Cardiology

## 2023-08-27 DIAGNOSIS — I10 Essential (primary) hypertension: Secondary | ICD-10-CM

## 2023-08-27 NOTE — Telephone Encounter (Signed)
 Left message of recommendation for referral to Dr. Raford.  Adv office will call to arrange visit.

## 2023-08-27 NOTE — Telephone Encounter (Signed)
 Seen by [name] yesterday.   Pt requesting to be referred to HTN clinic.  She said her BP fluctuates and she wants to work to optimize it.  She said at the end of June she was at a salon and used the bathroom.  She almost passed out and went to the hospital and found out that it was like vaso vagal episode.  She is hoping to see the BP clinic to help her optimize her diet to continue to control BP.  She is working to get her A1c down as well.  Has several concerns and feels anxious about all of it.   I will send a message to Dr. Lavona to see if he will do a referral to PharmD clinic, or if he has any other recommendations.  [name]

## 2023-08-27 NOTE — Telephone Encounter (Signed)
 Patient called to follow-up on getting into BP clinic.

## 2023-09-01 DIAGNOSIS — R5383 Other fatigue: Secondary | ICD-10-CM | POA: Diagnosis not present

## 2023-09-01 DIAGNOSIS — E782 Mixed hyperlipidemia: Secondary | ICD-10-CM | POA: Diagnosis not present

## 2023-09-01 DIAGNOSIS — I10 Essential (primary) hypertension: Secondary | ICD-10-CM | POA: Diagnosis not present

## 2023-09-01 DIAGNOSIS — Z789 Other specified health status: Secondary | ICD-10-CM | POA: Diagnosis not present

## 2023-09-01 DIAGNOSIS — R42 Dizziness and giddiness: Secondary | ICD-10-CM | POA: Diagnosis not present

## 2023-09-08 DIAGNOSIS — G4700 Insomnia, unspecified: Secondary | ICD-10-CM | POA: Diagnosis not present

## 2023-09-08 DIAGNOSIS — I10 Essential (primary) hypertension: Secondary | ICD-10-CM | POA: Diagnosis not present

## 2023-09-08 DIAGNOSIS — F411 Generalized anxiety disorder: Secondary | ICD-10-CM | POA: Diagnosis not present

## 2023-09-08 DIAGNOSIS — R42 Dizziness and giddiness: Secondary | ICD-10-CM | POA: Diagnosis not present

## 2023-09-11 ENCOUNTER — Ambulatory Visit (INDEPENDENT_AMBULATORY_CARE_PROVIDER_SITE_OTHER): Admitting: Otolaryngology

## 2023-09-11 ENCOUNTER — Other Ambulatory Visit: Payer: Self-pay | Admitting: Hematology and Oncology

## 2023-09-11 ENCOUNTER — Encounter (INDEPENDENT_AMBULATORY_CARE_PROVIDER_SITE_OTHER): Payer: Self-pay | Admitting: Otolaryngology

## 2023-09-11 ENCOUNTER — Other Ambulatory Visit: Payer: Self-pay | Admitting: *Deleted

## 2023-09-11 VITALS — BP 154/89 | HR 70 | Ht 67.0 in | Wt 175.0 lb

## 2023-09-11 DIAGNOSIS — R42 Dizziness and giddiness: Secondary | ICD-10-CM | POA: Diagnosis not present

## 2023-09-11 DIAGNOSIS — Z0110 Encounter for examination of ears and hearing without abnormal findings: Secondary | ICD-10-CM | POA: Diagnosis not present

## 2023-09-11 MED ORDER — ANASTROZOLE 1 MG PO TABS
1.0000 mg | ORAL_TABLET | Freq: Every day | ORAL | 4 refills | Status: AC
Start: 2023-09-11 — End: ?

## 2023-09-12 ENCOUNTER — Telehealth: Payer: Self-pay | Admitting: Cardiology

## 2023-09-12 DIAGNOSIS — K5900 Constipation, unspecified: Secondary | ICD-10-CM | POA: Diagnosis not present

## 2023-09-12 DIAGNOSIS — D696 Thrombocytopenia, unspecified: Secondary | ICD-10-CM | POA: Diagnosis not present

## 2023-09-12 DIAGNOSIS — R42 Dizziness and giddiness: Secondary | ICD-10-CM | POA: Diagnosis not present

## 2023-09-12 DIAGNOSIS — Z8639 Personal history of other endocrine, nutritional and metabolic disease: Secondary | ICD-10-CM | POA: Diagnosis not present

## 2023-09-12 NOTE — Telephone Encounter (Signed)
 Spoke to patient she seemed very anxious.She was calling to report B/P readings listed below.Stated she has not been taking Hydralazine  as prescribed.She will start taking as prescribed.She will continue to monitor B/P and take readings to appointment scheduled with Dr.Pueblito 9/22 at 9:30 am.I will send message to [facility] PA that saw you last to make her aware.

## 2023-09-12 NOTE — Telephone Encounter (Signed)
 Spoke to patient Laura Fabry PA's advice given.

## 2023-09-12 NOTE — Telephone Encounter (Signed)
 Pt c/o BP issue: STAT if pt c/o blurred vision, one-sided weakness or slurred speech.   1. What is your BP concern?  BP has been fluctuating. See previous encounter.  2. Have you taken any BP medication today? Hasn't taken BP medication in 1 month  3. What are your last 5 BP readings? 139/84 66, 150/96 75, 141/89 64, 127/84 66, 120/84, 116/80 63, 120/84 71  4. Are you having any other symptoms (ex. Dizziness, headache, blurred vision, passed out)?  Weakness, nausea, dizziness, and fluctuating BP with bowel movements.

## 2023-09-13 NOTE — Progress Notes (Signed)
 Patient ID: [name], female   DOB: May 22, 1955, 68 y.o.   MRN: [id number]  Cc: Recurrent dizziness  HPI: The patient is a 68 year old female who presents today complaining of recurrent dizziness.  She has been symptomatic for several years.  She describes her dizziness as an intermittent lightheaded and off-balance sensation.  The dizziness is often triggered when she gets up quickly from a sitting or supine position.  She had an accidental fall in her kitchen 2 months ago.  She denies any spinning vertigo.  She also denies any otalgia, otorrhea, or hearing difficulty.  She denies any significant cardiopulmonary disorder.  She was previously noted in 2023 to have normal hearing bilaterally across all frequencies.  Exam: General: Communicates without difficulty, well nourished, no acute distress. Head: Normocephalic, no evidence injury, no tenderness, facial buttresses intact without stepoff. Face/sinus: No tenderness to palpation and percussion. Facial movement is normal and symmetric. Eyes: PERRL, EOMI. No scleral icterus, conjunctivae clear. Neuro: CN II exam reveals vision grossly intact.  No nystagmus at any point of gaze. Ears: Auricles well formed without lesions.  Ear canals are intact without mass or lesion.  No erythema or edema is appreciated.  The TMs are intact without fluid. Nose: External evaluation reveals normal support and skin without lesions.  Dorsum is intact.  Anterior rhinoscopy reveals normal mucosa over anterior aspect of inferior turbinates and intact septum.  No purulence noted. Oral:  Oral cavity and oropharynx are intact, symmetric, without erythema or edema.  Mucosa is moist without lesions. Neck: Full range of motion without pain.  There is no significant lymphadenopathy.  No masses palpable.  Thyroid  bed within normal limits to palpation.  Parotid glands and submandibular glands equal bilaterally without mass.  Trachea is midline. Neuro:  CN 2-12 grossly intact.  Vestibular: No nystagmus at any point of gaze. Dix Hallpike negative. Vestibular: There is no nystagmus with pneumatic pressure on either tympanic membrane or Valsalva. The cerebellar examination is unremarkable.   Assessment: 1.  The patient's ear canals, tympanic membranes, and middle ear spaces are normal.  Her Dix-Hallpike maneuver is negative. 2.  Recurrent dizziness of unknown etiology. The possible differential diagnoses include transient BPPV, vestibular migraine, Meniere's disease, peripheral vestibular dysfunction, or other central/systemic causes.  3.  Normal hearing bilaterally.  Plan: 1.  The physical exam findings are reviewed with the patient. 2.  The pathophysiology of vestibular dysfunction and dizziness are discussed extensively with the patient. The possible differential diagnoses are reviewed. Questions are invited and answered.   3.  The patient will likely benefit from undergoing physical therapy/vestibular rehabilitation to improve her balancing function. A referral will be arranged as soon as possible.  4.  If the patient continues to be symptomatic, She may benefit from vestibular neurodiagnostic testing at a tertiary care center to evaluate for possible vestibular dysfunction.

## 2023-09-13 NOTE — Addendum Note (Signed)
 Addended byBETHA MOCCASIN, Selmer Adduci on: 09/13/2023 11:05 AM   Modules accepted: Orders

## 2023-09-16 ENCOUNTER — Telehealth: Payer: Self-pay | Admitting: Internal Medicine

## 2023-09-16 NOTE — Telephone Encounter (Signed)
 I contact patient to advise on the message below and patient hung up the phone on me as I was explaining.

## 2023-09-16 NOTE — Telephone Encounter (Signed)
 Patient is calling to say that she lost 10 pounds in May 2025 and changed her eating habits to eating healthier and more fiber.  Patient states that her A1-c is 6.8.  She states that since her weight loss every time she has a BM her blood pressure goes up, she feels nauseated, dizzy, and fatigued.  Patient would like to know what she should do and if she needs to schedule an appointment with someone.

## 2023-09-18 ENCOUNTER — Encounter (HOSPITAL_COMMUNITY): Payer: Self-pay | Admitting: Gastroenterology

## 2023-09-18 ENCOUNTER — Telehealth: Payer: Self-pay | Admitting: Cardiology

## 2023-09-18 ENCOUNTER — Other Ambulatory Visit (HOSPITAL_COMMUNITY): Payer: Self-pay | Admitting: Gastroenterology

## 2023-09-18 DIAGNOSIS — I10 Essential (primary) hypertension: Secondary | ICD-10-CM | POA: Diagnosis not present

## 2023-09-18 DIAGNOSIS — R748 Abnormal levels of other serum enzymes: Secondary | ICD-10-CM

## 2023-09-18 NOTE — Telephone Encounter (Signed)
 Went to family dr today and they want her to go back on daily bp med. Her PCP wants to put her on Hydrochlorothiazide 12.5 mg every day   Pt reports that she has had to take hydralazine  3 times this week.  From Saturday to Wednesday she has taken it 3 times.   Her BP is 135/89- she is going to go ahead and take hydralazine  5 mg- so this equals to 4 times this week.   She also reports that 1-2 times a week when she has a BM she has issues- feels dizzy, nauseated, tingly- and her BP goes up afterwards.    She wanted to ask Dr Lavona what he would suggest; if she should take the hydrochlorothiazide and stop the apresoline ? She doesn't want her bp to go down too low or anything.   Instructed her to continue to keep a log of her BP's and when she has to take hydralazine .  Informed her that I would send this information to Dr Lavona and be back in touch with his recommendations.

## 2023-09-18 NOTE — Telephone Encounter (Signed)
 Pt asked me to call her back in a few minutes she is on the other line with a Clinical research associate.

## 2023-09-18 NOTE — Telephone Encounter (Signed)
 Pt c/o BP issue: STAT if pt c/o blurred vision, one-sided weakness or slurred speech.  STAT if BP is GREATER than 180/120 TODAY.  STAT if BP is LESS than 90/60 and SYMPTOMATIC TODAY  1. What is your BP concern? Pt called in stating pt PCP wants to add her on new bp medication, wants to speak to nurse about it   2. Have you taken any BP medication today? Yes   3. What are your last 5 BP readings?  147/88 - today 1:30  149/94 - few minutes after   4. Are you having any other symptoms (ex. Dizziness, headache, blurred vision, passed out)? No

## 2023-09-22 NOTE — Telephone Encounter (Signed)
 Laura Agent, MD to [facility] Triage (Selected Message)     09/21/23  9:02 PM Tell her to take her BP 3 x per day and send me 10 days worth of readings.  She can hold off on starting the hydrochlorothiazide until we review the BPs.    Gave her the information above. She verbalized understanding.   She asked about why she was taken off of the Candesartan , why wasn't she just put on a lower dose. Informed her that she can discuss this with her provider after the 10 days of bp readings.

## 2023-09-23 ENCOUNTER — Encounter: Payer: Self-pay | Admitting: Cardiology

## 2023-09-23 NOTE — Telephone Encounter (Signed)
 Pt requesting a c/b in regards to previous phone note.

## 2023-09-23 NOTE — Telephone Encounter (Signed)
 error

## 2023-09-23 NOTE — Telephone Encounter (Signed)
 Patient has been taking hydralazine  5 mg as needed for DBP >85 (up to 2 doses daily). She reports her BP continues to spike and has been doing so since her Candesartan  was discontinued.  BP today: 6:30 AM -- 110/80 1:00 PM -- 133/87 1:30 PM -- 146/90  Patient reports when her BP spiked at 1:30 today it caused her vision to be bleary and pain down the side of her face, neck and shoulder. She states as her BP goes down symptoms resolve. She states this is causing her a lot of frustration and anxiety. These episodes have been happening weekly for the past 2 weeks when her BP spikes.  Patient is asking if she needs a scan of her neck. She would like to go back on her Candesartan  just at a lower dose than she was previously taking.  Will forward to [name] Fabry, PA-C to review and advise.

## 2023-09-24 ENCOUNTER — Other Ambulatory Visit: Payer: Self-pay

## 2023-09-24 MED ORDER — CANDESARTAN CILEXETIL 4 MG PO TABS: 4.0000 mg | ORAL_TABLET | Freq: Every day | ORAL | 3 refills | Status: AC

## 2023-09-24 NOTE — Telephone Encounter (Signed)
 Spoke to patient [employer] advice given.She will start Candesartan  4 mg daily.Advised to check B/P 1 hour after meds morning,noon,night.Advised to go to ED if she has anymore visual changes.Advised to call back if B/P continues to be elevated.

## 2023-09-28 ENCOUNTER — Emergency Department (HOSPITAL_BASED_OUTPATIENT_CLINIC_OR_DEPARTMENT_OTHER)
Admission: EM | Admit: 2023-09-28 | Discharge: 2023-09-28 | Disposition: A | Discharge status: Home / Self Care | Attending: Emergency Medicine | Admitting: Emergency Medicine

## 2023-09-28 ENCOUNTER — Encounter (HOSPITAL_BASED_OUTPATIENT_CLINIC_OR_DEPARTMENT_OTHER): Payer: Self-pay | Admitting: Emergency Medicine

## 2023-09-28 ENCOUNTER — Emergency Department (HOSPITAL_BASED_OUTPATIENT_CLINIC_OR_DEPARTMENT_OTHER): Admitting: Radiology

## 2023-09-28 ENCOUNTER — Other Ambulatory Visit: Payer: Self-pay

## 2023-09-28 ENCOUNTER — Emergency Department (HOSPITAL_BASED_OUTPATIENT_CLINIC_OR_DEPARTMENT_OTHER)

## 2023-09-28 DIAGNOSIS — R079 Chest pain, unspecified: Secondary | ICD-10-CM | POA: Insufficient documentation

## 2023-09-28 DIAGNOSIS — R0789 Other chest pain: Secondary | ICD-10-CM | POA: Diagnosis not present

## 2023-09-28 DIAGNOSIS — I10 Essential (primary) hypertension: Secondary | ICD-10-CM | POA: Diagnosis not present

## 2023-09-28 DIAGNOSIS — Z79899 Other long term (current) drug therapy: Secondary | ICD-10-CM | POA: Diagnosis not present

## 2023-09-28 DIAGNOSIS — R110 Nausea: Secondary | ICD-10-CM | POA: Insufficient documentation

## 2023-09-28 DIAGNOSIS — Z7982 Long term (current) use of aspirin: Secondary | ICD-10-CM | POA: Insufficient documentation

## 2023-09-28 DIAGNOSIS — Z853 Personal history of malignant neoplasm of breast: Secondary | ICD-10-CM | POA: Diagnosis not present

## 2023-09-28 DIAGNOSIS — K449 Diaphragmatic hernia without obstruction or gangrene: Secondary | ICD-10-CM | POA: Diagnosis not present

## 2023-09-28 DIAGNOSIS — I7121 Aneurysm of the ascending aorta, without rupture: Secondary | ICD-10-CM | POA: Diagnosis not present

## 2023-09-28 DIAGNOSIS — I700 Atherosclerosis of aorta: Secondary | ICD-10-CM | POA: Diagnosis not present

## 2023-09-28 LAB — BASIC METABOLIC PANEL WITH GFR
Anion gap: 14 (ref 5–15)
BUN: 10 mg/dL (ref 8–23)
CO2: 20 mmol/L — ABNORMAL LOW (ref 22–32)
Calcium: 10.3 mg/dL (ref 8.9–10.3)
Chloride: 101 mmol/L (ref 98–111)
Creatinine, Ser: 0.89 mg/dL (ref 0.44–1.00)
GFR, Estimated: >60 mL/min (ref >60)
Glucose, Bld: 147 mg/dL — ABNORMAL HIGH (ref 70–99)
Potassium: 3.9 mmol/L (ref 3.5–5.1)
Sodium: 136 mmol/L (ref 135–145)

## 2023-09-28 LAB — CBC
HCT: 38.8 % (ref 36.0–46.0)
Hemoglobin: 13.1 g/dL (ref 12.0–15.0)
MCH: 32 pg (ref 26.0–34.0)
MCHC: 33.8 g/dL (ref 30.0–36.0)
MCV: 94.9 fL (ref 80.0–100.0)
Platelets: 133 K/uL — ABNORMAL LOW (ref 150–400)
RBC: 4.09 MIL/uL (ref 3.87–5.11)
RDW: 12.3 % (ref 11.5–15.5)
WBC: 5.2 K/uL (ref 4.0–10.5)
nRBC: 0 % (ref 0.0–0.2)

## 2023-09-28 LAB — HEPATIC FUNCTION PANEL
ALT: 19 U/L (ref 0–44)
AST: 22 U/L (ref 15–41)
Albumin: 4.3 g/dL (ref 3.5–5.0)
Alkaline Phosphatase: 181 U/L — ABNORMAL HIGH (ref 38–126)
Bilirubin, Direct: 0.3 mg/dL — ABNORMAL HIGH (ref 0.0–0.2)
Indirect Bilirubin: 0.4 mg/dL (ref 0.3–0.9)
Total Bilirubin: 0.6 mg/dL (ref 0.0–1.2)
Total Protein: 7.8 g/dL (ref 6.5–8.1)

## 2023-09-28 LAB — TROPONIN T, HIGH SENSITIVITY
Troponin T High Sensitivity: <15 ng/L (ref 0–19)
Troponin T High Sensitivity: <15 ng/L (ref 0–19)

## 2023-09-28 LAB — LIPASE, BLOOD: Lipase: 23 U/L (ref 11–51)

## 2023-09-28 LAB — D-DIMER, QUANTITATIVE: D-Dimer, Quant: 0.96 ug{FEU}/mL — ABNORMAL HIGH (ref 0.00–0.50)

## 2023-09-28 MED ORDER — ONDANSETRON HCL 4 MG/2ML IJ SOLN
4.0000 mg | Freq: Once | INTRAMUSCULAR | Status: AC
  Administered 2023-09-28: 4 mg via INTRAVENOUS
  Filled 2023-09-28: qty 2

## 2023-09-28 MED ORDER — IOHEXOL 350 MG/ML SOLN
75.0000 mL | Freq: Once | INTRAVENOUS | Status: AC | PRN
  Administered 2023-09-28: 75 mL via INTRAVENOUS

## 2023-09-28 MED ORDER — ONDANSETRON 4 MG PO TBDP: 4.0000 mg | ORAL_TABLET | Freq: Three times a day (TID) | ORAL | 0 refills | Status: DC | PRN

## 2023-09-28 NOTE — ED Notes (Signed)
 Patient transported to CT

## 2023-09-28 NOTE — Discharge Instructions (Signed)
 Follow-up with your primary care doctor regarding this incidental finding.  Follow-up with cardiology.  4 cm ascending thoracic aortic aneurysm unchanged. Recommend annual imaging followup by CTA or MRA. This recommendation follows 2010 ACCF/AHA/AATS/ACR/ASA/SCA/SCAI/SIR/STS/SVM Guidelines for the Diagnosis and Management of Patients with Thoracic Aortic Disease. Circulation. 2010; 121: [id number]. Aortic aneurysm NOS (ICD10-I71.9

## 2023-09-28 NOTE — ED Provider Notes (Signed)
 [place] EMERGENCY DEPARTMENT AT [facility] Provider Note   CSN: [id number] Arrival date & time: 09/28/23  1355     Patient presents with: Chest Pain   [name] is a 68 y.o. female.   Is here with chest pain.  History of blood clots.  Not on anticoagulation.  Been on and off for couple weeks.  Also having nausea.  She does not really complain of chest pain currently but having nausea symptoms at times.  Denies any weakness numbness tingling.  She is been having diarrhea for the last couple weeks as well.  All of these issues have been going on for several months.  She has had workup by GI.  She denies any shortness of breath cough sputum production.  History of breast cancer DVT PE Graves' disease anxiety.  No CAD history.  History of hypertension high cholesterol.  No recent surgery or travel.  The history is provided by the patient.       Prior to Admission medications   Medication Sig Start Date End Date Taking? Authorizing Provider  ondansetron  (ZOFRAN -ODT) 4 MG disintegrating tablet Take 1 tablet (4 mg total) by mouth every 8 (eight) hours as needed. 09/28/23  Yes Kayla Weekes, DO  anastrozole  (ARIMIDEX ) 1 MG tablet Take 1 tablet (1 mg total) by mouth daily. 09/11/23   Iruku, Praveena, MD  Ascorbic Acid (VITAMIN C) 1000 MG tablet Take 1,000 mg by mouth daily. Patient not taking: Reported on 09/11/2023    [provider]  aspirin  EC 81 MG tablet Take 1 tablet (81 mg total) by mouth daily. 10/07/17   Lavona Agent, MD  atorvastatin  (LIPITOR) 20 MG tablet Take 1 tablet (20 mg total) by mouth daily. 08/26/23   Lucien [name] SAILOR, PA-C  candesartan  (ATACAND ) 4 MG tablet Take 1 tablet (4 mg total) by mouth daily. 09/24/23   Conte, Tessa N, PA-C  Cholecalciferol (VITAMIN D -3 PO) Take 1 capsule by mouth daily. Patient not taking: Reported on 09/11/2023    [provider]  dicyclomine  (BENTYL ) 20 MG tablet Take 1 tablet (20 mg total) by mouth 3 (three) times  daily as needed for spasms (abdominal cramping). 08/14/23   Long, Joshua G, MD  hydrALAZINE  (APRESOLINE ) 10 MG tablet Take 0.5 tablets (5 mg total) by mouth as needed (for diastolic greater than 85 for up to 2 doses.). 08/26/23   Lucien [name] SAILOR, PA-C  LORazepam (ATIVAN) 0.5 MG tablet Take 0.5 mg by mouth 2 (two) times daily as needed. Patient not taking: Reported on 09/11/2023 12/29/20   [provider]  meloxicam (MOBIC) 15 MG tablet Take 15 mg by mouth daily. 04/24/23   [provider]  methimazole  (TAPAZOLE ) 5 MG tablet Take 1 tablet (5 mg total) by mouth as directed. Take 2 tabs on Sundays and 1 tab rest of week Patient not taking: Reported on 09/11/2023 03/05/23   Shamleffer, Donell Cardinal, MD  Omega-3 Fatty Acids (OMEGA-3 FISH OIL PO) Take 2,000 mg by mouth. Patient not taking: Reported on 09/11/2023    [provider]  pantoprazole  (PROTONIX ) 20 MG tablet Take 1 tablet (20 mg total) by mouth daily. 08/14/23 09/13/23  Long, Joshua G, MD  sertraline (ZOLOFT) 25 MG tablet Take 25 mg by mouth daily.    [provider]    Allergies: Amoxicillin-pot clavulanate and Niacin-lovastatin er    Review of Systems  Updated Vital Signs BP 138/87   Pulse (!) 58   Temp 98.1 F (36.7 C)  Resp 14   Ht 5' 7 (1.702 m)   Wt 78.9 kg   LMP 06/18/2010   SpO2 99%   BMI 27.25 kg/m   Physical Exam Vitals and nursing note reviewed.  Constitutional:      General: She is not in acute distress.    Appearance: She is well-developed.  HENT:     Head: Normocephalic and atraumatic.  Eyes:     Extraocular Movements: Extraocular movements intact.     Conjunctiva/sclera: Conjunctivae normal.     Pupils: Pupils are equal, round, and reactive to light.  Cardiovascular:     Rate and Rhythm: Normal rate and regular rhythm.     Pulses:          Radial pulses are 2+ on the right side and 2+ on the left side.     Heart sounds: Normal heart sounds. No murmur heard. Pulmonary:      Effort: Pulmonary effort is normal. No respiratory distress.     Breath sounds: Normal breath sounds. No decreased breath sounds or wheezing.  Abdominal:     Palpations: Abdomen is soft.     Tenderness: There is no abdominal tenderness.  Musculoskeletal:        General: No swelling.     Cervical back: Normal range of motion and neck supple.  Skin:    General: Skin is warm and dry.     Capillary Refill: Capillary refill takes less than 2 seconds.  Neurological:     General: No focal deficit present.     Mental Status: She is alert.  Psychiatric:        Mood and Affect: Mood normal.     (all labs ordered are listed, but only abnormal results are displayed) Labs Reviewed  BASIC METABOLIC PANEL WITH GFR - Abnormal; Notable for the following components:      Result Value   CO2 20 (*)    Glucose, Bld 147 (*)    All other components within normal limits  CBC - Abnormal; Notable for the following components:   Platelets 133 (*)    All other components within normal limits  D-DIMER, QUANTITATIVE - Abnormal; Notable for the following components:   D-Dimer, Quant 0.96 (*)    All other components within normal limits  HEPATIC FUNCTION PANEL - Abnormal; Notable for the following components:   Alkaline Phosphatase 181 (*)    Bilirubin, Direct 0.3 (*)    All other components within normal limits  LIPASE, BLOOD  TROPONIN T, HIGH SENSITIVITY  TROPONIN T, HIGH SENSITIVITY    EKG: EKG Interpretation Date/Time:  Sunday September 28 2023 14:08:31 EDT Ventricular Rate:  72 PR Interval:  134 QRS Duration:  80 QT Interval:  386 QTC Calculation: 422 R Axis:   18  Text Interpretation: Sinus rhythm with occasional Premature ventricular complexes Possible Left atrial enlargement Borderline ECG When compared with ECG of 07-Aug-2023 11:45, PREVIOUS ECG IS PRESENT Confirmed by Ruthe Cornet [phone]) on 09/28/2023 3:04:14 PM  Radiology: CT Angio Chest PE W and/or Wo Contrast Result Date:  09/28/2023 CLINICAL DATA:  Left-sided chest pain radiating to left arm and left neck EXAM: CT ANGIOGRAPHY CHEST WITH CONTRAST TECHNIQUE: Multidetector CT imaging of the chest was performed using the standard protocol during bolus administration of intravenous contrast. Multiplanar CT image reconstructions and MIPs were obtained to evaluate the vascular anatomy. RADIATION DOSE REDUCTION: This exam was performed according to the departmental dose-optimization program which includes automated exposure control, adjustment of the mA and/or kV according  to patient size and/or use of iterative reconstruction technique. CONTRAST:  75mL OMNIPAQUE  IOHEXOL  350 MG/ML SOLN COMPARISON:  04/02/2023, 09/28/2023 FINDINGS: Cardiovascular: This is a technically adequate evaluation of the pulmonary vasculature. No filling defects or pulmonary emboli. The heart is unremarkable without pericardial effusion. 4 cm ascending thoracic aortic aneurysm without evidence of dissection. Atherosclerosis of the aortic arch. Mediastinum/Nodes: Stable appearance of the thyroid , trachea, and esophagus. No pathologic adenopathy. Small hiatal hernia. Lungs/Pleura: No acute airspace disease, effusion, or pneumothorax. The central airways are patent. Upper Abdomen: No acute abnormality. Musculoskeletal: No acute or destructive bony abnormalities. Postsurgical changes within the right breast. Reconstructed images demonstrate no additional findings. Review of the MIP images confirms the above findings. IMPRESSION: 1. No evidence of pulmonary embolus. 2. 4 cm ascending thoracic aortic aneurysm unchanged. Recommend annual imaging followup by CTA or MRA. This recommendation follows 2010 ACCF/AHA/AATS/ACR/ASA/SCA/SCAI/SIR/STS/SVM Guidelines for the Diagnosis and Management of Patients with Thoracic Aortic Disease. Circulation. 2010; 121: Z733-z630. Aortic aneurysm NOS (ICD10-I71.9) 3. Small hiatal hernia. 4.  Aortic Atherosclerosis (ICD10-I70.0). Electronically  Signed   By: Ozell Daring M.D.   On: 09/28/2023 17:14   DG Chest 2 View Result Date: 09/28/2023 CLINICAL DATA:  Left-sided chest pain for several weeks. EXAM: CHEST - 2 VIEW COMPARISON:  08/07/2023. FINDINGS: The heart size and mediastinal contours are within normal limits. There is atherosclerotic calcification of the aorta. No consolidation, effusion, or pneumothorax is seen. Surgical clips are noted in the right chest wall. There are degenerative changes in the thoracic spine. No acute osseous abnormality is seen. IMPRESSION: No active cardiopulmonary disease. Electronically Signed   By: Leita Birmingham M.D.   On: 09/28/2023 14:57     Procedures   Medications Ordered in the ED  ondansetron  (ZOFRAN ) injection 4 mg (4 mg Intravenous Given 09/28/23 1541)  iohexol  (OMNIPAQUE ) 350 MG/ML injection 75 mL (75 mLs Intravenous Contrast Given 09/28/23 1635)                                    Medical Decision Making Amount and/or Complexity of Data Reviewed Labs: ordered. Radiology: ordered.  Risk Prescription drug management.   [name] is here with chest pain and nausea.  History of DVT PE no anticoagulation.  History of hypertension high cholesterol anxiety.  Symptoms somewhat on and off for the last couple months.  Also having nausea diarrhea symptoms that she has had worked up including follow-up with GI and CT scan abdomen pelvis.  Today we will focus mostly on her chest pain.  Diarrhea nausea seems somewhat chronic.  This could all be underlying anxiety but will evaluate for ACS PE pneumonia electrolyte abnormality dehydration.  Will see if there is any evidence of pancreatitis cholecystitis with blood work as well.  Is not having any abdominal pain and I do not think we need to pursue any abdominal workup at this time.  Ultimately will get labs.  EKG shows sinus rhythm.  No ischemic changes.  Left far labs show no significant leukocytosis anemia or electrolyte abnormality kidney  injury.  Troponin normal.  Overall have very low suspicion for ACS.  Will repeat troponin.  Will get D-dimer lipase hepatic function panel.  Will give Zofran .  Heart score is 3.  CT scan showed no evidence of PE.  She does have a 4 cm ascending thoracic aortic aneurysm that is unchanged.  She is made aware of this.  She  needs yearly check for this.  Overall I do think her symptoms might be due to underlying anxiety but will have her follow-up with cardiology and primary care.  Discharge.  This chart was dictated using voice recognition software.  Despite best efforts to proofread,  errors can occur which can change the documentation meaning.      Final diagnoses:  Nonspecific chest pain    ED Discharge Orders          Ordered    ondansetron  (ZOFRAN -ODT) 4 MG disintegrating tablet  Every 8 hours PRN        09/28/23 1641    Ambulatory referral to Cardiology       Comments: If you have not heard from the Cardiology office within the next 72 hours please call [phone].   09/28/23 1641               Ruthe Cornet, DO 09/28/23 1732

## 2023-09-28 NOTE — ED Triage Notes (Signed)
 Pt caox4 c/o L CP radiating up left side of neck and to L arm, nausea and diarrhea. Nausea and diarrhea x1 mo, CP intermittent for weeks.

## 2023-09-28 NOTE — ED Notes (Signed)
 Dc instructions given, pt verbalized understanding. Pt out of ED with steady gait not in visible distress.

## 2023-09-29 ENCOUNTER — Ambulatory Visit (HOSPITAL_COMMUNITY)

## 2023-09-29 ENCOUNTER — Other Ambulatory Visit (HOSPITAL_COMMUNITY)

## 2023-10-02 ENCOUNTER — Telehealth: Payer: Self-pay

## 2023-10-02 DIAGNOSIS — E785 Hyperlipidemia, unspecified: Secondary | ICD-10-CM | POA: Diagnosis not present

## 2023-10-02 DIAGNOSIS — R5383 Other fatigue: Secondary | ICD-10-CM | POA: Diagnosis not present

## 2023-10-02 DIAGNOSIS — R739 Hyperglycemia, unspecified: Secondary | ICD-10-CM | POA: Diagnosis not present

## 2023-10-02 NOTE — Telephone Encounter (Signed)
 Pt called and states that for the past two months she has been experiencing N/V/D, fatigue and has been to her PCP and ED for eval of this with no resolution. She wonders if this is being caused by anastrozole . Per MD, pt was advised to stop anastrozole  for two weeks and come in for f/u to eval if sx have subsided. She is agreeable to this plan and accepted an appt with Morna Kendall, NP 9/4 at 1020. She knows to call with any immediate concerns

## 2023-10-06 DIAGNOSIS — I10 Essential (primary) hypertension: Secondary | ICD-10-CM | POA: Diagnosis not present

## 2023-10-06 DIAGNOSIS — K219 Gastro-esophageal reflux disease without esophagitis: Secondary | ICD-10-CM | POA: Diagnosis not present

## 2023-10-06 DIAGNOSIS — G4700 Insomnia, unspecified: Secondary | ICD-10-CM | POA: Diagnosis not present

## 2023-10-06 DIAGNOSIS — F411 Generalized anxiety disorder: Secondary | ICD-10-CM | POA: Diagnosis not present

## 2023-10-07 ENCOUNTER — Other Ambulatory Visit: Payer: Self-pay | Admitting: Radiology

## 2023-10-07 ENCOUNTER — Encounter (HOSPITAL_COMMUNITY): Payer: Self-pay

## 2023-10-07 DIAGNOSIS — R748 Abnormal levels of other serum enzymes: Secondary | ICD-10-CM

## 2023-10-07 NOTE — H&P (Signed)
 Chief Complaint: Hypermylasemia. Request is for random liver biopsy  Referring Physician(s): Mann,Jyothi  Supervising Physician: Jennefer Rover  Patient Status: [facility] - Out-pt  History of Present Illness: [name] is a 68 y.o. female  outpatient. History of Graves disease, HLD, HTN, PE, aortic aneurysm, breast cancer s/p radiation. Found to have elevated amylase. Team is requesting a liver biopsy for further evaluation.  Family at bedside. Patient alert and laying in bed,calm. Endorses nausea which she states is related to nerves. Denies any fevers, headache, chest pain, SOB, cough, abdominal pain, vomiting or bleeding.   Patient states she is no longer on ASA 81 mg. No pertinent allergies. Patient has been NPO since midnight.   Return precautions and treatment recommendations and follow-up discussed with the patient and her family member at bedside. Both who is agreeable with the plan.      Past Medical History:  Diagnosis Date   ALLERGIC RHINITIS 03/30/2008   Qualifier: Diagnosis of  By: Norleen MD, Lynwood ORN    Anxiety    Arthritis    neck   Breast cancer (HCC) 2018   Right Breast Cancer   Deep venous thrombosis (HCC) 01/26/2021   Dysarthria 09/22/2012   GERD (gastroesophageal reflux disease)    Graves' disease 09/03/2014   Headache(784.0) 09/22/2012   History of hiatal hernia    History of radiation therapy 02/05/2017   02/05/17-03/05/17,  right breast 40.05 Gy in 15 fractions, right breast boost 10 Gy in 5 fractions   Hyperlipidemia    Hypertension    Hyperthyroidism    Iron deficiency anemia    LEG PAIN, RIGHT 04/22/2008   Qualifier: Diagnosis of  By: Norleen MD, Lynwood ORN    Low back pain 01/26/2021   Malignant tumor of breast (HCC) 11/13/2016   Palpitation 09/24/2013   Personal history of radiation therapy 2018   Right Breast Cancer   Pulmonary emboli (HCC) 11/20/2016   Uterine fibroid    Vitamin D  deficiency    Graves disease    Past Surgical History:   Procedure Laterality Date   BREAST LUMPECTOMY Right 12/16/2016   BREAST LUMPECTOMY WITH RADIOACTIVE SEED AND SENTINEL LYMPH NODE BIOPSY Right 12/16/2016   Procedure: RIGHT BREAST LUMPECTOMY WITH RADIOACTIVE SEED AND SENTINEL LYMPH NODE BIOPSY;  Surgeon: Ebbie Cough, MD;  Location: [facility] OR;  Service: General;  Laterality: Right;   CHOLECYSTECTOMY     COLONOSCOPY     DILATION AND CURETTAGE OF UTERUS     01/2008   ESOPHAGOGASTRODUODENOSCOPY     ivc filter      Allergies: Amoxicillin-pot clavulanate and Niacin-lovastatin er  Medications: Prior to Admission medications   Medication Sig Start Date End Date Taking? Authorizing Provider  anastrozole  (ARIMIDEX ) 1 MG tablet Take 1 tablet (1 mg total) by mouth daily. 09/11/23   Iruku, Praveena, MD  Ascorbic Acid (VITAMIN C) 1000 MG tablet Take 1,000 mg by mouth daily. Patient not taking: Reported on 09/11/2023    [provider]  aspirin  EC 81 MG tablet Take 1 tablet (81 mg total) by mouth daily. 10/07/17   Lavona Lynwood, MD  atorvastatin  (LIPITOR) 20 MG tablet Take 1 tablet (20 mg total) by mouth daily. 08/26/23   Lucien [name] SAILOR, PA-C  candesartan  (ATACAND ) 4 MG tablet Take 1 tablet (4 mg total) by mouth daily. 09/24/23   Conte, Tessa N, PA-C  Cholecalciferol (VITAMIN D -3 PO) Take 1 capsule by mouth daily. Patient not taking: Reported on 09/11/2023    [provider]  dicyclomine  (  BENTYL ) 20 MG tablet Take 1 tablet (20 mg total) by mouth 3 (three) times daily as needed for spasms (abdominal cramping). 08/14/23   Long, Joshua G, MD  hydrALAZINE  (APRESOLINE ) 10 MG tablet Take 0.5 tablets (5 mg total) by mouth as needed (for diastolic greater than 85 for up to 2 doses.). 08/26/23   Lucien [name] SAILOR, PA-C  LORazepam (ATIVAN) 0.5 MG tablet Take 0.5 mg by mouth 2 (two) times daily as needed. Patient not taking: Reported on 09/11/2023 12/29/20   [provider]  meloxicam (MOBIC) 15 MG tablet Take 15 mg by mouth daily. 04/24/23    [provider]  methimazole  (TAPAZOLE ) 5 MG tablet Take 1 tablet (5 mg total) by mouth as directed. Take 2 tabs on Sundays and 1 tab rest of week Patient not taking: Reported on 09/11/2023 03/05/23   Shamleffer, Donell Cardinal, MD  Omega-3 Fatty Acids (OMEGA-3 FISH OIL PO) Take 2,000 mg by mouth. Patient not taking: Reported on 09/11/2023    [provider]  ondansetron  (ZOFRAN -ODT) 4 MG disintegrating tablet Take 1 tablet (4 mg total) by mouth every 8 (eight) hours as needed. 09/28/23   Curatolo, Adam, DO  pantoprazole  (PROTONIX ) 20 MG tablet Take 1 tablet (20 mg total) by mouth daily. 08/14/23 09/13/23  Long, Joshua G, MD  sertraline (ZOLOFT) 25 MG tablet Take 25 mg by mouth daily.    [provider]     Family History  Problem Relation Age of Onset   Drug abuse Brother    Hypertension Mother    Colon cancer Maternal Grandfather     Social History   Socioeconomic History   Marital status: Single    Spouse name: Not on file   Number of children: Not on file   Years of education: college   Highest education level: Not on file  Occupational History   Occupation: TAX SPECIALIST    Employer: [employer]  Tobacco Use   Smoking status: Former    Current packs/day: 0.00    Average packs/day: 0.4 packs/day for 10.0 years (4.0 ttl pk-yrs)    Types: Cigarettes    Start date: 05/31/1992    Quit date: 06/01/2002    Years since quitting: 21.3   Smokeless tobacco: Never  Vaping Use   Vaping status: Never Used  Substance and Sexual Activity   Alcohol use: Yes    Comment: occasional 1 drink per month   Drug use: No   Sexual activity: Not on file  Other Topics Concern   Not on file  Social History Narrative   Lives alone.    Social Drivers of Corporate investment banker Strain: Not on file  Food Insecurity: Not on file  Transportation Needs: Not on file  Physical Activity: Not on file  Stress: Not on file  Social Connections: Unknown (06/25/2021)   Received  from [facility]   Social Network    Social Network: Not on file     Review of Systems: A 12 point ROS discussed and pertinent positives are indicated in the HPI above.  All other systems are negative.  Review of Systems  Constitutional:  Negative for fatigue and fever.  HENT:  Negative for congestion.   Respiratory:  Negative for cough and shortness of breath.   Gastrointestinal:  Positive for nausea. Negative for abdominal pain, diarrhea and vomiting.    Vital Signs: BP (!) 151/87   Pulse 74   Temp 97.7 F (36.5 C) (Oral)   Resp 18  Ht 5' 7 (1.702 m)   Wt 173 lb (78.5 kg)   LMP 06/18/2010   SpO2 100%   BMI 27.10 kg/m   Advance Care Plan: The advanced care plan/surrogate decision maker was discussed at the time of visit and the patient did not wish to discuss or was not able to name a surrogate decision maker or provide an advance care plan.    Physical Exam Vitals and nursing note reviewed.  Constitutional:      Appearance: Normal appearance. She is well-developed.  HENT:     Head: Normocephalic and atraumatic.     Mouth/Throat:     Mouth: Mucous membranes are dry.  Eyes:     Conjunctiva/sclera: Conjunctivae normal.  Cardiovascular:     Rate and Rhythm: Normal rate.  Pulmonary:     Effort: Pulmonary effort is normal.  Musculoskeletal:        General: Normal range of motion.     Cervical back: Normal range of motion.  Skin:    General: Skin is warm and dry.  Neurological:     General: No focal deficit present.     Mental Status: She is alert and oriented to person, place, and time. Mental status is at baseline.  Psychiatric:        Mood and Affect: Mood normal.        Behavior: Behavior normal.        Thought Content: Thought content normal.        Judgment: Judgment normal.     Imaging: CT Angio Chest PE W and/or Wo Contrast Result Date: 09/28/2023 CLINICAL DATA:  Left-sided chest pain radiating to left arm and left neck EXAM: CT ANGIOGRAPHY CHEST  WITH CONTRAST TECHNIQUE: Multidetector CT imaging of the chest was performed using the standard protocol during bolus administration of intravenous contrast. Multiplanar CT image reconstructions and MIPs were obtained to evaluate the vascular anatomy. RADIATION DOSE REDUCTION: This exam was performed according to the departmental dose-optimization program which includes automated exposure control, adjustment of the mA and/or kV according to patient size and/or use of iterative reconstruction technique. CONTRAST:  75mL OMNIPAQUE  IOHEXOL  350 MG/ML SOLN COMPARISON:  04/02/2023, 09/28/2023 FINDINGS: Cardiovascular: This is a technically adequate evaluation of the pulmonary vasculature. No filling defects or pulmonary emboli. The heart is unremarkable without pericardial effusion. 4 cm ascending thoracic aortic aneurysm without evidence of dissection. Atherosclerosis of the aortic arch. Mediastinum/Nodes: Stable appearance of the thyroid , trachea, and esophagus. No pathologic adenopathy. Small hiatal hernia. Lungs/Pleura: No acute airspace disease, effusion, or pneumothorax. The central airways are patent. Upper Abdomen: No acute abnormality. Musculoskeletal: No acute or destructive bony abnormalities. Postsurgical changes within the right breast. Reconstructed images demonstrate no additional findings. Review of the MIP images confirms the above findings. IMPRESSION: 1. No evidence of pulmonary embolus. 2. 4 cm ascending thoracic aortic aneurysm unchanged. Recommend annual imaging followup by CTA or MRA. This recommendation follows 2010 ACCF/AHA/AATS/ACR/ASA/SCA/SCAI/SIR/STS/SVM Guidelines for the Diagnosis and Management of Patients with Thoracic Aortic Disease. Circulation. 2010; 121: Z733-z630. Aortic aneurysm NOS (ICD10-I71.9) 3. Small hiatal hernia. 4.  Aortic Atherosclerosis (ICD10-I70.0). Electronically Signed   By: Ozell Daring M.D.   On: 09/28/2023 17:14   DG Chest 2 View Result Date: 09/28/2023 CLINICAL  DATA:  Left-sided chest pain for several weeks. EXAM: CHEST - 2 VIEW COMPARISON:  08/07/2023. FINDINGS: The heart size and mediastinal contours are within normal limits. There is atherosclerotic calcification of the aorta. No consolidation, effusion, or pneumothorax is seen. Surgical clips are noted  in the right chest wall. There are degenerative changes in the thoracic spine. No acute osseous abnormality is seen. IMPRESSION: No active cardiopulmonary disease. Electronically Signed   By: Leita Birmingham M.D.   On: 09/28/2023 14:57    Labs:  CBC: Recent Labs    04/01/23 1855 08/07/23 1153 08/14/23 1158 09/28/23 1414  WBC 9.7 5.6 5.1 5.2  HGB 13.6 12.1 12.6 13.1  HCT 41.0 37.4 39.2 38.8  PLT 171 125* 144* 133*    COAGS: No results for input(s): INR, APTT in the last 8760 hours.  BMP: Recent Labs    04/01/23 1855 08/07/23 1153 08/14/23 1158 09/28/23 1414  NA 139 137 140 136  K 3.9 3.4* 3.9 3.9  CL 102 107 107 101  CO2 25 23 24  20*  GLUCOSE 124* 150* 109* 147*  BUN 21 17 13 10   CALCIUM  9.3 9.0 9.3 10.3  CREATININE 1.04* 0.96 0.96 0.89  GFRNONAA 60* >60 >60 >60    LIVER FUNCTION TESTS: Recent Labs    12/05/22 1132 12/16/22 1146 04/01/23 2215 08/14/23 1158 09/28/23 1536  BILITOT 0.7 0.3 0.5 0.8 0.6  AST 18 15 17 20 22   ALT 18 15 21 22 19   ALKPHOS 221* 262* 162* 162* 181*  PROT 8.2*  --  7.1 8.5* 7.8  ALBUMIN 4.0 4.2 3.5 4.3 4.3    TUMOR MARKERS: No results for input(s): AFPTM, CEA, CA199, CHROMGRNA in the last 8760 hours.  Assessment and Plan:  67 y.o. female outpatient. History of Graves disease, HLD, HTN, PE, aortic aneurysm, breast cancer s/p radiation. Found to have elevated amylase. Team is requesting a liver biopsy for further evaluation.   PLAN: IR Image Guided Random Liver Biopsy   Risks and benefits of random liver biopsy  was discussed with the patient and/or patient's family including, but not limited to bleeding, infection, damage to  adjacent structures or low yield requiring additional tests.  All of the questions were answered and there is agreement to proceed.  Consent signed and in chart.   Thank you for this interesting consult.  I greatly enjoyed meeting KATHLYNE LOUD and look forward to participating in their care.  A copy of this report was sent to the requesting provider on this date.  Electronically Signed: Delon JAYSON Beagle, NP 10/08/2023, 8:32 AM   I spent a total of  30 Minutes   in face to face in clinical consultation, greater than 50% of which was counseling/coordinating care for liver biopsy

## 2023-10-08 ENCOUNTER — Telehealth: Payer: Self-pay

## 2023-10-08 ENCOUNTER — Other Ambulatory Visit: Payer: Self-pay

## 2023-10-08 ENCOUNTER — Ambulatory Visit (HOSPITAL_COMMUNITY)
Admission: RE | Admit: 2023-10-08 | Discharge: 2023-10-08 | Disposition: A | Discharge status: Home / Self Care | Source: Ambulatory Visit | Attending: Gastroenterology | Admitting: Gastroenterology

## 2023-10-08 DIAGNOSIS — E0500 Thyrotoxicosis with diffuse goiter without thyrotoxic crisis or storm: Secondary | ICD-10-CM | POA: Insufficient documentation

## 2023-10-08 DIAGNOSIS — M47814 Spondylosis without myelopathy or radiculopathy, thoracic region: Secondary | ICD-10-CM | POA: Diagnosis not present

## 2023-10-08 DIAGNOSIS — R110 Nausea: Secondary | ICD-10-CM | POA: Insufficient documentation

## 2023-10-08 DIAGNOSIS — Z923 Personal history of irradiation: Secondary | ICD-10-CM | POA: Insufficient documentation

## 2023-10-08 DIAGNOSIS — Z853 Personal history of malignant neoplasm of breast: Secondary | ICD-10-CM | POA: Insufficient documentation

## 2023-10-08 DIAGNOSIS — Z79811 Long term (current) use of aromatase inhibitors: Secondary | ICD-10-CM | POA: Diagnosis not present

## 2023-10-08 DIAGNOSIS — Z79899 Other long term (current) drug therapy: Secondary | ICD-10-CM | POA: Insufficient documentation

## 2023-10-08 DIAGNOSIS — I10 Essential (primary) hypertension: Secondary | ICD-10-CM | POA: Insufficient documentation

## 2023-10-08 DIAGNOSIS — Z7982 Long term (current) use of aspirin: Secondary | ICD-10-CM | POA: Insufficient documentation

## 2023-10-08 DIAGNOSIS — Z87891 Personal history of nicotine dependence: Secondary | ICD-10-CM | POA: Diagnosis not present

## 2023-10-08 DIAGNOSIS — K7401 Hepatic fibrosis, early fibrosis: Secondary | ICD-10-CM | POA: Diagnosis not present

## 2023-10-08 DIAGNOSIS — R748 Abnormal levels of other serum enzymes: Secondary | ICD-10-CM | POA: Insufficient documentation

## 2023-10-08 DIAGNOSIS — I7121 Aneurysm of the ascending aorta, without rupture: Secondary | ICD-10-CM | POA: Insufficient documentation

## 2023-10-08 DIAGNOSIS — K7581 Nonalcoholic steatohepatitis (NASH): Secondary | ICD-10-CM | POA: Diagnosis not present

## 2023-10-08 DIAGNOSIS — E0590 Thyrotoxicosis, unspecified without thyrotoxic crisis or storm: Secondary | ICD-10-CM

## 2023-10-08 DIAGNOSIS — E785 Hyperlipidemia, unspecified: Secondary | ICD-10-CM | POA: Diagnosis not present

## 2023-10-08 DIAGNOSIS — Z791 Long term (current) use of non-steroidal anti-inflammatories (NSAID): Secondary | ICD-10-CM | POA: Insufficient documentation

## 2023-10-08 LAB — CBC
HCT: 38.7 % (ref 36.0–46.0)
Hemoglobin: 12.9 g/dL (ref 12.0–15.0)
MCH: 31.9 pg (ref 26.0–34.0)
MCHC: 33.3 g/dL (ref 30.0–36.0)
MCV: 95.6 fL (ref 80.0–100.0)
Platelets: 142 K/uL — ABNORMAL LOW (ref 150–400)
RBC: 4.05 MIL/uL (ref 3.87–5.11)
RDW: 12.6 % (ref 11.5–15.5)
WBC: 4.7 K/uL (ref 4.0–10.5)
nRBC: 0 % (ref 0.0–0.2)

## 2023-10-08 LAB — GLUCOSE, CAPILLARY
Glucose-Capillary: 136 mg/dL — ABNORMAL HIGH (ref 70–99)
Glucose-Capillary: 132 mg/dL — ABNORMAL HIGH (ref 70–99)

## 2023-10-08 LAB — PROTIME-INR
INR: 1 (ref 0.8–1.2)
Prothrombin Time: 13.6 s (ref 11.4–15.2)

## 2023-10-08 MED ORDER — FENTANYL CITRATE (PF) 100 MCG/2ML IJ SOLN
INTRAMUSCULAR | Status: AC
Start: 2023-10-08 — End: 2023-10-08
  Filled 2023-10-08: qty 2

## 2023-10-08 MED ORDER — MIDAZOLAM HCL 2 MG/2ML IJ SOLN
INTRAMUSCULAR | Status: AC | PRN
Start: 2023-10-08 — End: 2023-10-08
  Administered 2023-10-08 (×2): 1 mg via INTRAVENOUS

## 2023-10-08 MED ORDER — FENTANYL CITRATE (PF) 100 MCG/2ML IJ SOLN
INTRAMUSCULAR | Status: AC | PRN
  Administered 2023-10-08 (×2): 50 ug via INTRAVENOUS

## 2023-10-08 MED ORDER — MIDAZOLAM HCL 2 MG/2ML IJ SOLN
INTRAMUSCULAR | Status: AC
  Filled 2023-10-08: qty 4

## 2023-10-08 MED ORDER — GELATIN ABSORBABLE 12-7 MM EX MISC
1.0000 | Freq: Once | CUTANEOUS | Status: AC
  Administered 2023-10-08: 1 via TOPICAL

## 2023-10-08 MED ORDER — LIDOCAINE HCL (PF) 1 % IJ SOLN
10.0000 mL | Freq: Once | INTRAMUSCULAR | Status: AC
  Administered 2023-10-08: 10 mL via INTRADERMAL

## 2023-10-08 MED ORDER — ONDANSETRON HCL 4 MG/2ML IJ SOLN
INTRAMUSCULAR | Status: AC | PRN
  Administered 2023-10-08: 4 mg via INTRAVENOUS

## 2023-10-08 MED ORDER — ONDANSETRON HCL 4 MG/2ML IJ SOLN
INTRAMUSCULAR | Status: AC
  Filled 2023-10-08: qty 2

## 2023-10-08 NOTE — Procedures (Signed)
 Interventional Radiology Procedure Note  Procedure: Ultrasound guided liver biopsy   Findings: Please refer to procedural dictation for full description.18 ga core x2 from right lobe.  Gelfoam slurry needle track embolization.  Complications: None immediate  Estimated Blood Loss: < 5 mL  Recommendations: Strict 3 hour bedrest. Follow Pathology results.   Ester Sides, MD

## 2023-10-08 NOTE — Telephone Encounter (Signed)
LVM and send mychart message

## 2023-10-08 NOTE — Telephone Encounter (Signed)
 Patient hands have been very shaky and she would like to have labs recheck.

## 2023-10-09 LAB — SURGICAL PATHOLOGY

## 2023-10-10 NOTE — Telephone Encounter (Signed)
 Patient has been schedule for 10/14/23

## 2023-10-14 ENCOUNTER — Other Ambulatory Visit

## 2023-10-16 ENCOUNTER — Encounter: Admitting: Adult Health

## 2023-10-16 DIAGNOSIS — E1165 Type 2 diabetes mellitus with hyperglycemia: Secondary | ICD-10-CM | POA: Diagnosis not present

## 2023-10-16 DIAGNOSIS — E0590 Thyrotoxicosis, unspecified without thyrotoxic crisis or storm: Secondary | ICD-10-CM | POA: Diagnosis not present

## 2023-10-16 DIAGNOSIS — Z23 Encounter for immunization: Secondary | ICD-10-CM | POA: Diagnosis not present

## 2023-10-16 DIAGNOSIS — E782 Mixed hyperlipidemia: Secondary | ICD-10-CM | POA: Diagnosis not present

## 2023-10-16 DIAGNOSIS — I10 Essential (primary) hypertension: Secondary | ICD-10-CM | POA: Diagnosis not present

## 2023-10-16 DIAGNOSIS — Z789 Other specified health status: Secondary | ICD-10-CM | POA: Diagnosis not present

## 2023-10-16 NOTE — Progress Notes (Unsigned)
  Cardiology Office Note:   Date:  10/17/2023  ID:  [name], DOB 05/18/1955, MRN [id number] PCP: [name], MD  [facility] Providers Cardiologist:  Lynwood Schilling, MD {  History of Present Illness:   [name] is a 68 y.o. female  who presents for follow up of palpitations.  I saw her in 2019  She has had palpitations in the past and reports a treadmill test years ago in [place] .   She has a history of DVT/PE and IVC filter several years ago.     She had chest pain when I saw her and I sent her for a POET (Plain Old Exercise Treadmill).  This was negative for ischemia.   He was seen in July with near syncope.  This was thought to be hypotension and hydralazine  was   She admits to being very anxious.  She has had multiple manipulations to her blood pressure medicines.  Most recently she has been taking 2 mg of candesartan .  She has not been taking her as needed hydralazine .  She was in the emergency room recently because of nausea and some chest discomfort.  She had a CT of her chest which demonstrated no pulmonary embolism.  However, the aorta was 40 mm and she has been very anxious about this.  Her biggest complaint is fatigue.  She also has nausea.  She feels foggy some of the time.   ROS: As stated in the HPI and negative for all other systems.   Studies Reviewed:    EKG:     Sinus rhythm, rate 72, axis within normal limits, intervals within normal limits, premature ventricular contractions, no acute ST-T wave changes.    Risk Assessment/Calculations:     Physical Exam:   VS:  BP 122/70   Pulse 71   Ht 5' 7 (1.702 m)   Wt 173 lb 6.4 oz (78.7 kg)   LMP 06/18/2010   SpO2 98%   BMI 27.16 kg/m    Wt Readings from Last 3 Encounters:  10/17/23 173 lb 6.4 oz (78.7 kg)  10/08/23 173 lb (78.5 kg)  09/28/23 174 lb (78.9 kg)     GEN: Well nourished, well developed in no acute distress NECK: No JVD; No carotid bruits CARDIAC: RRR, 2 out of 6 brief  apical systolic murmur radiating slightly at the aortic outflow tract, no diastolic murmurs, rubs, gallops RESPIRATORY:  Clear to auscultation without rales, wheezing or rhonchi  ABDOMEN: Soft, non-tender, non-distended EXTREMITIES:  No edema; No deformity   ASSESSMENT AND PLAN:   PALPITATIONS:   She is not particular bothered by these.  No change in therapy.    HTN:   Her blood pressure is controlled and I think the current dose of candesartan  is good.  No change in therapy.   CHEST PAIN:    She had a negative POET (Plain Old Exercise Treadmill)   .  She has had no anginal symptoms since then.  AO ENLARGEMENT: I will follow-up with a CT in 1 year  MURMUR: She has a slight systolic murmur and I suspect some mild aortic sclerosis or stenosis and I will consider follow-up echo in a year.     Follow up with me in 1 year.  Signed, Lynwood Schilling, MD

## 2023-10-17 ENCOUNTER — Other Ambulatory Visit

## 2023-10-17 ENCOUNTER — Encounter: Payer: Self-pay | Admitting: Cardiology

## 2023-10-17 ENCOUNTER — Ambulatory Visit: Attending: Cardiology | Admitting: Cardiology

## 2023-10-17 VITALS — BP 122/70 | HR 71 | Ht 67.0 in | Wt 173.4 lb

## 2023-10-17 DIAGNOSIS — R072 Precordial pain: Secondary | ICD-10-CM

## 2023-10-17 DIAGNOSIS — R42 Dizziness and giddiness: Secondary | ICD-10-CM

## 2023-10-17 DIAGNOSIS — R002 Palpitations: Secondary | ICD-10-CM | POA: Diagnosis not present

## 2023-10-17 DIAGNOSIS — I77819 Aortic ectasia, unspecified site: Secondary | ICD-10-CM

## 2023-10-17 DIAGNOSIS — R5383 Other fatigue: Secondary | ICD-10-CM | POA: Diagnosis not present

## 2023-10-17 DIAGNOSIS — E0590 Thyrotoxicosis, unspecified without thyrotoxic crisis or storm: Secondary | ICD-10-CM | POA: Diagnosis not present

## 2023-10-17 NOTE — Patient Instructions (Addendum)
 Medication Instructions:  Your physician recommends that you continue on your current medications as directed. Please refer to the Current Medication list given to you today.  *If you need a refill on your cardiac medications before your next appointment, please call your pharmacy*  Lab Work: NONE If you have labs (blood work) drawn today and your tests are completely normal, you will receive your results only by: MyChart Message (if you have MyChart) OR A paper copy in the mail If you have any lab test that is abnormal or we need to change your treatment, we will call you to review the results.  Testing/Procedures: Ct of Aorta in 1 year  Follow-Up: At [facility], you and your health needs are our priority.  As part of our continuing mission to provide you with exceptional heart care, our providers are all part of one team.  This team includes your primary Cardiologist (physician) and Advanced Practice Providers or APPs (Physician Assistants and Nurse Practitioners) who all work together to provide you with the care you need, when you need it.  Your next appointment:   1 year(s)  Provider:   Lynwood Schilling, MD    We recommend signing up for the patient portal called MyChart.  Sign up information is provided on this After Visit Summary.  MyChart is used to connect with patients for Virtual Visits (Telemedicine).  Patients are able to view lab/test results, encounter notes, upcoming appointments, etc.  Non-urgent messages can be sent to your provider as well.   To learn more about what you can do with MyChart, go to [email or web address].

## 2023-10-18 LAB — T4, FREE: Free T4: 1.4 ng/dL (ref 0.8–1.8)

## 2023-10-18 LAB — TSH: TSH: 1.38 m[IU]/L (ref 0.40–4.50)

## 2023-10-20 ENCOUNTER — Ambulatory Visit: Payer: Self-pay | Admitting: Internal Medicine

## 2023-10-21 ENCOUNTER — Ambulatory Visit: Admitting: Physical Therapy

## 2023-10-22 ENCOUNTER — Encounter: Payer: Self-pay | Admitting: Adult Health

## 2023-10-22 ENCOUNTER — Other Ambulatory Visit: Payer: Self-pay | Admitting: Hematology and Oncology

## 2023-10-22 ENCOUNTER — Inpatient Hospital Stay: Attending: Adult Health | Admitting: Adult Health

## 2023-10-22 VITALS — BP 164/86 | HR 66 | Temp 98.1°F | Resp 18 | Ht 67.0 in | Wt 171.5 lb

## 2023-10-22 DIAGNOSIS — Z1721 Progesterone receptor positive status: Secondary | ICD-10-CM | POA: Diagnosis not present

## 2023-10-22 DIAGNOSIS — Z170 Estrogen receptor positive status [ER+]: Secondary | ICD-10-CM | POA: Insufficient documentation

## 2023-10-22 DIAGNOSIS — Z86711 Personal history of pulmonary embolism: Secondary | ICD-10-CM | POA: Insufficient documentation

## 2023-10-22 DIAGNOSIS — Z923 Personal history of irradiation: Secondary | ICD-10-CM | POA: Insufficient documentation

## 2023-10-22 DIAGNOSIS — Z1231 Encounter for screening mammogram for malignant neoplasm of breast: Secondary | ICD-10-CM

## 2023-10-22 DIAGNOSIS — Z1732 Human epidermal growth factor receptor 2 negative status: Secondary | ICD-10-CM | POA: Diagnosis not present

## 2023-10-22 DIAGNOSIS — Z86718 Personal history of other venous thrombosis and embolism: Secondary | ICD-10-CM | POA: Insufficient documentation

## 2023-10-22 DIAGNOSIS — Z87891 Personal history of nicotine dependence: Secondary | ICD-10-CM | POA: Insufficient documentation

## 2023-10-22 DIAGNOSIS — C50511 Malignant neoplasm of lower-outer quadrant of right female breast: Secondary | ICD-10-CM | POA: Insufficient documentation

## 2023-10-22 DIAGNOSIS — F419 Anxiety disorder, unspecified: Secondary | ICD-10-CM | POA: Diagnosis not present

## 2023-10-22 NOTE — Progress Notes (Signed)
 [facility] Cancer Follow up:    Laura Almarie SAUNDERS, MD [address] [place] [address]   DIAGNOSIS: Cancer Staging  Malignant neoplasm of lower-outer quadrant of right breast of female, estrogen receptor positive (HCC) Staging form: Breast, AJCC 8th Edition - Clinical stage from 11/20/2016: Stage Unknown (cTX, cN0, cM0, G1, ER+, PR+, HER2-) - Unsigned Histologic grading system: 3 grade system Laterality: Right Staged by: Pathologist and managing physician Stage used in treatment planning: Yes National guidelines used in treatment planning: Yes Type of national guideline used in treatment planning: NCCN    SUMMARY OF ONCOLOGIC HISTORY: 68 y.o. [place] woman status post right breast lower outer quadrant biopsy 11/11/2016 for a clinical TX N), stage I invasive ductal carcinoma, grade 2, E-cadherin positive, estrogen receptor 95% positive, progesterone receptor 5% positive, with an MIB-1 of 10%, and no HER-2 amplification   (1) Status post right lumpectomy and right axillary sentinel lymph node sampling 12/16/2016 for a pT1c pN0, stage IA invasive ductal carcinoma, grade 1, with negative margins.  Total of 3 sentinel lymph nodes removed   (2) The Oncotype DX score was 18, predicting a risk of outside the breast recurrence over the next 10 years of 11% if the patient's only systemic therapy is tamoxifen for 5 years.  It also predicts no benefit from chemotherapy.   (3) Adjuvant radiation 02/05/2017-03/05/2017 Site/dose:           1. Right breast, 2.67 Gy in 15 fractions for a total dose of 40.05 Gy                               2. Right breast boost, 2 Gy in 5 fractions for a total dose of 10 Gy    (4) anastrozole  started February 2019             (a) not a good tamoxifen candidate given problem #5             (b) bone density at breast center 10/04/2015 with T-score of -0.3 (normal)             (c) bone density 12/28/2018 showed a T score of -0.4 (normal)              (d) bone density 04/26/2021   (5) history of DVT/PE March 2010, with negative extensive hypercoagulable workup, IVC filter in place  CURRENT THERAPY:  INTERVAL HISTORY:  Discussed the use of AI scribe software for clinical note transcription with the patient, who gave verbal consent to proceed.  History of Present Illness [name] is a 68 year old female with a history of right breast invasive ductal carcinoma who presents for follow-up and evaluation.  She was diagnosed with right breast invasive ductal carcinoma, ER positive, in 2018. She underwent a lumpectomy and adjuvant radiation, and began antiestrogen therapy with anastrozole  in February 2019, which she plans to continue for a total of seven years. Her recent bilateral breast mammogram on November 25, 2022, showed no mammographic evidence of malignancy and breast density category B.  Over the past two months, she has experienced nausea, diarrhea, and fatigue, leading to visits to her primary care doctor and the ER. A liver biopsy revealed a fatty liver. She has not experienced vomiting. She notes a lack of appetite and fluctuating appetite levels.     Patient Active Problem List   Diagnosis Date Noted   Right thyroid  nodule 10/15/2022  Pre-diabetes 07/04/2021   Wellness examination 03/06/2021   Graves disease 03/02/2021   Dizziness 03/02/2021   History of pulmonary embolism 01/26/2021   Uterine leiomyoma 01/26/2021   Vitamin D  deficiency 01/26/2021   Dyslipidemia 11/08/2018   S/P insertion of inferior vena caval filter 08/04/2018   Systolic murmur 07/10/2018   History of DVT (deep vein thrombosis) 02/16/2018   History of breast cancer 02/16/2018   Malignant neoplasm of lower-outer quadrant of right breast of female, estrogen receptor positive (HCC) 11/19/2016   Alkaline phosphatase raised 10/29/2015   Anxiety 10/04/2014   Hyperthyroidism 09/03/2014   Vitamin B 12 deficiency 04/19/2008   ANEMIA, IRON  DEFICIENCY 04/19/2008   Hypertensive disorder 03/30/2008   GERD 03/30/2008    is allergic to amoxicillin-pot clavulanate and niacin-lovastatin er.  MEDICAL HISTORY: Past Medical History:  Diagnosis Date   ALLERGIC RHINITIS 03/30/2008   Qualifier: Diagnosis of  By: Norleen MD, Lynwood ORN    Anxiety    Arthritis    neck   Breast cancer (HCC) 2018   Right Breast Cancer   Deep venous thrombosis (HCC) 01/26/2021   Dysarthria 09/22/2012   GERD (gastroesophageal reflux disease)    Graves' disease 09/03/2014   Headache(784.0) 09/22/2012   History of hiatal hernia    History of radiation therapy 02/05/2017   02/05/17-03/05/17,  right breast 40.05 Gy in 15 fractions, right breast boost 10 Gy in 5 fractions   Hyperlipidemia    Hypertension    Hyperthyroidism    Iron deficiency anemia    LEG PAIN, RIGHT 04/22/2008   Qualifier: Diagnosis of  By: Norleen MD, Lynwood ORN    Low back pain 01/26/2021   Malignant tumor of breast (HCC) 11/13/2016   Palpitation 09/24/2013   Personal history of radiation therapy 2018   Right Breast Cancer   Pulmonary emboli (HCC) 11/20/2016   Uterine fibroid    Vitamin D  deficiency    Graves disease    SURGICAL HISTORY: Past Surgical History:  Procedure Laterality Date   BREAST LUMPECTOMY Right 12/16/2016   BREAST LUMPECTOMY WITH RADIOACTIVE SEED AND SENTINEL LYMPH NODE BIOPSY Right 12/16/2016   Procedure: RIGHT BREAST LUMPECTOMY WITH RADIOACTIVE SEED AND SENTINEL LYMPH NODE BIOPSY;  Surgeon: Ebbie Cough, MD;  Location: [facility] OR;  Service: General;  Laterality: Right;   CHOLECYSTECTOMY     COLONOSCOPY     DILATION AND CURETTAGE OF UTERUS     01/2008   ESOPHAGOGASTRODUODENOSCOPY     ivc filter      SOCIAL HISTORY: Social History   Socioeconomic History   Marital status: Single    Spouse name: Not on file   Number of children: Not on file   Years of education: college   Highest education level: Not on file  Occupational History   Occupation: TAX  SPECIALIST    Employer: [employer]  Tobacco Use   Smoking status: Former    Current packs/day: 0.00    Average packs/day: 0.4 packs/day for 10.0 years (4.0 ttl pk-yrs)    Types: Cigarettes    Start date: 05/31/1992    Quit date: 06/01/2002    Years since quitting: 21.4   Smokeless tobacco: Never  Vaping Use   Vaping status: Never Used  Substance and Sexual Activity   Alcohol use: Yes    Comment: occasional 1 drink per month   Drug use: No   Sexual activity: Not on file  Other Topics Concern   Not on file  Social History Narrative   Lives alone.  Social Drivers of Corporate Investment Banker Strain: Not on file  Food Insecurity: Not on file  Transportation Needs: Not on file  Physical Activity: Not on file  Stress: Not on file  Social Connections: Unknown (06/25/2021)   Received from [facility]   Social Network    Social Network: Not on file  Intimate Partner Violence: Unknown (05/17/2021)   Received from [facility]   HITS    Physically Hurt: Not on file    Insult or Talk Down To: Not on file    Threaten Physical Harm: Not on file    Scream or Curse: Not on file    FAMILY HISTORY: Family History  Problem Relation Age of Onset   Drug abuse Brother    Hypertension Mother    Colon cancer Maternal Grandfather     Review of Systems  Constitutional:  Positive for appetite change. Negative for chills, fatigue, fever and unexpected weight change.  HENT:   Negative for hearing loss, lump/mass and trouble swallowing.   Eyes:  Negative for eye problems and icterus.  Respiratory:  Negative for chest tightness, cough and shortness of breath.   Cardiovascular:  Negative for chest pain, leg swelling and palpitations.  Gastrointestinal:  Negative for abdominal distention, abdominal pain, constipation, diarrhea, nausea and vomiting.  Endocrine: Negative for hot flashes.  Genitourinary:  Negative for difficulty urinating.   Musculoskeletal:  Negative for arthralgias.   Skin:  Negative for itching and rash.  Neurological:  Negative for dizziness, extremity weakness, headaches and numbness.  Hematological:  Negative for adenopathy. Does not bruise/bleed easily.  Psychiatric/Behavioral:  Negative for depression. The patient is not nervous/anxious.       PHYSICAL EXAMINATION   Onc Performance Status - 10/22/23 0949       KPS SCALE   KPS % SCORE Able to carry on normal activity, minor s/s of disease          Vitals:   10/22/23 0945  BP: (!) 164/86  Pulse: 66  Resp: 18  Temp: 98.1 F (36.7 C)  SpO2: 100%    Physical Exam Constitutional:      General: She is not in acute distress.    Appearance: Normal appearance. She is not toxic-appearing.  HENT:     Head: Normocephalic and atraumatic.     Mouth/Throat:     Mouth: Mucous membranes are moist.     Pharynx: Oropharynx is clear. No oropharyngeal exudate or posterior oropharyngeal erythema.  Eyes:     General: No scleral icterus. Cardiovascular:     Rate and Rhythm: Normal rate and regular rhythm.     Pulses: Normal pulses.     Heart sounds: Normal heart sounds.  Pulmonary:     Effort: Pulmonary effort is normal.     Breath sounds: Normal breath sounds.  Abdominal:     General: Abdomen is flat. Bowel sounds are normal. There is no distension.     Palpations: Abdomen is soft.     Tenderness: There is no abdominal tenderness.  Musculoskeletal:        General: No swelling.     Cervical back: Neck supple.  Lymphadenopathy:     Cervical: No cervical adenopathy.  Skin:    General: Skin is warm and dry.     Findings: No rash.  Neurological:     General: No focal deficit present.     Mental Status: She is alert.  Psychiatric:        Mood and Affect: Mood normal.  Behavior: Behavior normal.     LABORATORY DATA:  CBC    Component Value Date/Time   WBC 4.7 10/08/2023 0757   RBC 4.05 10/08/2023 0757   HGB 12.9 10/08/2023 0757   HGB 12.9 12/05/2022 1132   HGB 11.8  01/26/2021 1025   HGB 12.5 11/20/2016 0822   HCT 38.7 10/08/2023 0757   HCT 36.6 01/26/2021 1025   HCT 38.2 11/20/2016 0822   PLT 142 (L) 10/08/2023 0757   PLT 150 12/05/2022 1132   PLT 178 01/26/2021 1025   MCV 95.6 10/08/2023 0757   MCV 95 01/26/2021 1025   MCV 97.9 11/20/2016 0822   MCH 31.9 10/08/2023 0757   MCHC 33.3 10/08/2023 0757   RDW 12.6 10/08/2023 0757   RDW 12.9 01/26/2021 1025   RDW 13.1 11/20/2016 0822   LYMPHSABS 1.0 08/14/2023 1158   LYMPHSABS 1.5 01/26/2021 1025   LYMPHSABS 2.4 11/20/2016 0822   MONOABS 0.3 08/14/2023 1158   MONOABS 0.3 11/20/2016 0822   EOSABS 0.0 08/14/2023 1158   EOSABS 0.2 01/26/2021 1025   BASOSABS 0.0 08/14/2023 1158   BASOSABS 0.0 01/26/2021 1025   BASOSABS 0.0 11/20/2016 0822    CMP     Component Value Date/Time   NA 136 09/28/2023 1414   NA 142 01/26/2021 1025   NA 141 11/20/2016 0822   K 3.9 09/28/2023 1414   K 3.6 11/20/2016 0822   CL 101 09/28/2023 1414   CL 105 06/24/2012 1548   CO2 20 (L) 09/28/2023 1414   CO2 24 11/20/2016 0822   GLUCOSE 147 (H) 09/28/2023 1414   GLUCOSE 102 11/20/2016 0822   GLUCOSE 91 06/24/2012 1548   BUN 10 09/28/2023 1414   BUN 12 01/26/2021 1025   BUN 19.2 11/20/2016 0822   CREATININE 0.89 09/28/2023 1414   CREATININE 0.94 12/05/2022 1132   CREATININE 0.9 11/20/2016 0822   CALCIUM  10.3 09/28/2023 1414   CALCIUM  9.5 11/20/2016 0822   PROT 7.8 09/28/2023 1536   PROT 7.8 01/26/2021 1025   PROT 8.2 11/20/2016 0822   ALBUMIN 4.3 09/28/2023 1536   ALBUMIN 4.2 12/16/2022 1146   ALBUMIN 3.8 11/20/2016 0822   AST 22 09/28/2023 1536   AST 18 12/05/2022 1132   AST 15 11/20/2016 0822   ALT 19 09/28/2023 1536   ALT 18 12/05/2022 1132   ALT 15 11/20/2016 0822   ALKPHOS 181 (H) 09/28/2023 1536   ALKPHOS 122 11/20/2016 0822   BILITOT 0.6 09/28/2023 1536   BILITOT 0.3 12/16/2022 1146   BILITOT 0.7 12/05/2022 1132   BILITOT 0.52 11/20/2016 0822   GFRNONAA >60 09/28/2023 1414   GFRNONAA >60  12/05/2022 1132   GFRAA >60 03/18/2019 1631     ASSESSMENT and THERAPY PLAN:  Assessment and Plan Assessment & Plan Right breast invasive ductal carcinoma, ER-positive On anastrozole  for six years with side effects. Marginal benefit beyond five years. Concerned about recurrence and anxious about stopping medication. - Advise anastrozole  every other day, monitor side effects. - Order Guardant Reveal blood test to detect circulating tumor DNA. - Schedule follow-up in four weeks.  Anxiety Anxiety related to cancer recurrence and anastrozole  decision, possibly influenced by social and lifestyle changes. - Order Guardant Reveal testing  - Advise monitoring anxiety symptoms, consider further evaluation if persistent.   All questions were answered. The patient knows to call the clinic with any problems, questions or concerns. We can certainly see the patient much sooner if necessary.  Total encounter time:45 minutes*in face-to-face visit time, chart review,  lab review, care coordination, order entry, and documentation of the encounter time.    Morna Kendall, NP 10/22/23 10:01 AM Medical Oncology and Hematology [facility] [address] [place], [place] [address] Tel. [phone]    Fax. [phone]  *Total Encounter Time as defined by the Centers for Medicare and Medicaid Services includes, in addition to the face-to-face time of a patient visit (documented in the note above) non-face-to-face time: obtaining and reviewing outside history, ordering and reviewing medications, tests or procedures, care coordination (communications with other health care professionals or caregivers) and documentation in the medical record.

## 2023-10-29 NOTE — Therapy (Unsigned)
 OUTPATIENT PHYSICAL THERAPY VESTIBULAR EVALUATION     Patient Name: [name] MRN: [id number] DOB:14-Nov-1955, 68 y.o., female Today's Date: 10/30/2023  END OF SESSION:  PT End of Session - 10/30/23 1317     Visit Number 1    Number of Visits 5    Date for Recertification  11/29/23    Authorization Type BLUE CROSS BLUE [employer]IELD    PT Start Time 1316    PT Stop Time 1356    PT Time Calculation (min) 40 min    Activity Tolerance Patient tolerated treatment well    Behavior During Therapy WFL for tasks assessed/performed          Past Medical History:  Diagnosis Date   ALLERGIC RHINITIS 03/30/2008   Qualifier: Diagnosis of  By: Norleen MD, Lynwood ORN    Anxiety    Arthritis    neck   Breast cancer (HCC) 2018   Right Breast Cancer   Deep venous thrombosis (HCC) 01/26/2021   Dysarthria 09/22/2012   GERD (gastroesophageal reflux disease)    Graves' disease 09/03/2014   Headache(784.0) 09/22/2012   History of hiatal hernia    History of radiation therapy 02/05/2017   02/05/17-03/05/17,  right breast 40.05 Gy in 15 fractions, right breast boost 10 Gy in 5 fractions   Hyperlipidemia    Hypertension    Hyperthyroidism    Iron deficiency anemia    LEG PAIN, RIGHT 04/22/2008   Qualifier: Diagnosis of  By: Norleen MD, Lynwood ORN    Low back pain 01/26/2021   Malignant tumor of breast (HCC) 11/13/2016   Palpitation 09/24/2013   Personal history of radiation therapy 2018   Right Breast Cancer   Pulmonary emboli (HCC) 11/20/2016   Uterine fibroid    Vitamin D  deficiency    Graves disease   Past Surgical History:  Procedure Laterality Date   BREAST LUMPECTOMY Right 12/16/2016   BREAST LUMPECTOMY WITH RADIOACTIVE SEED AND SENTINEL LYMPH NODE BIOPSY Right 12/16/2016   Procedure: RIGHT BREAST LUMPECTOMY WITH RADIOACTIVE SEED AND SENTINEL LYMPH NODE BIOPSY;  Surgeon: Ebbie Cough, MD;  Location: [facility] OR;  Service: General;  Laterality: Right;   CHOLECYSTECTOMY     COLONOSCOPY      DILATION AND CURETTAGE OF UTERUS     01/2008   ESOPHAGOGASTRODUODENOSCOPY     ivc filter     Patient Active Problem List   Diagnosis Date Noted   Right thyroid  nodule 10/15/2022   Pre-diabetes 07/04/2021   Wellness examination 03/06/2021   Graves disease 03/02/2021   Dizziness 03/02/2021   History of pulmonary embolism 01/26/2021   Uterine leiomyoma 01/26/2021   Vitamin D  deficiency 01/26/2021   Dyslipidemia 11/08/2018   S/P insertion of inferior vena caval filter 08/04/2018   Systolic murmur 07/10/2018   History of DVT (deep vein thrombosis) 02/16/2018   History of breast cancer 02/16/2018   Malignant neoplasm of lower-outer quadrant of right breast of female, estrogen receptor positive (HCC) 11/19/2016   Alkaline phosphatase raised 10/29/2015   Anxiety 10/04/2014   Hyperthyroidism 09/03/2014   Vitamin B 12 deficiency 04/19/2008   ANEMIA, IRON DEFICIENCY 04/19/2008   Hypertensive disorder 03/30/2008   GERD 03/30/2008    PCP: [name], MD  REFERRING PROVIDER: Karis Clunes, MD  REFERRING DIAG: R42 (ICD-10-CM) - Dizziness  THERAPY DIAG:  Dizziness and giddiness  Unsteadiness on feet  ONSET DATE: 09/13/2023  Rationale for Evaluation and Treatment: Rehabilitation  SUBJECTIVE:   SUBJECTIVE STATEMENT: Right now she feels dizzy if she bends  over at times. Had a bad fall back in June and got dizzy afterwards. Was cooking and got oil on the floor and slipped on it and fell to the floor. Has some stomach issues, when she goes to the bathroom and gets fatigued and gets dizzy. Notes dizziness has been going on for the past 4 months. Sometimes will feel like a panic and feel fatigued, nervous. Takes her anxiety pills 1x a day. Was really scared after she almost passed out and now she doesn't want to have that happen again. Feeling a little nervous now coming into appt and feels her heart having palpitations. Sometimes will feel like she goes to the side when she is  walking. Pt reporting better after eval   Pt accompanied by: self  PERTINENT HISTORY: PMH: Anxiety, R breast cancer with R breast lumpectomy (2018), hx of radiation, DVT, HLD, HTN  Per Dr. Karis: She has been symptomatic for several years. She describes her dizziness as an intermittent lightheaded and off-balance sensation. The dizziness is often triggered when she gets up quickly from a sitting or supine position. She had an accidental fall in her kitchen 2 months ago.   PAIN:  Are you having pain? No  Vitals:   10/30/23 1330 10/30/23 1331  BP: 135/76 131/89  Pulse: 67 74   Sitting, Standing Pt reporting feeling lightheaded standing up   PRECAUTIONS: Fall  FALLS: Has patient fallen in last 6 months? Yes. Number of falls 1   PLOF: Independent  PATIENT GOALS: My ENT signed me up, doesn't want to feel dizzy and improve her balance   OBJECTIVE:  Note: Objective measures were completed at Evaluation unless otherwise noted. COGNITION: Overall cognitive status: Within functional limits for tasks assessed  GAIT: Gait pattern: WFL Distance walked: Clinic distances  Assistive device utilized: None Level of assistance: Complete Independence Comments: No unsteadiness noted during eval    VESTIBULAR ASSESSMENT:  GENERAL OBSERVATION: Ambulates in with no AD independently   SYMPTOM BEHAVIOR:  Subjective history: See above Non-Vestibular symptoms: nausea   Type of dizziness: Lightheadedness/Faint and feels foggy brained   Frequency: Few times a week  Duration: Normally they don't last all day, if she relaxes or goes to sleep normally they wear off   Aggravating factors: Induced by position change: supine to sit and Induced by motion: bending down to the ground, turning body quickly, and turning head quickly  Relieving factors: lying supine and staying still   Progression of symptoms: better, notes sometimes the dizziness will get better   OCULOMOTOR EXAM:  Ocular Alignment:  normal  Ocular ROM: No Limitations  Spontaneous Nystagmus: absent  Gaze-Induced Nystagmus: absent  Smooth Pursuits: intact  Saccades: intact and pt reporting dizziness in vertical superior and inferior directions   VESTIBULAR - OCULAR REFLEX:   Slow VOR: Normal  VOR Cancellation: Normal, dizziness and reporting feeling it in her stomach   Head-Impulse Test: HIT Right: positive HIT Left: negative Pt reporting dizziness afterwards   Dynamic Visual Acuity: Static: Line 8 Dynamic: Line 5 3 line difference, Pt reporting feeling dizziness during    MOTION SENSITIVITY:  Motion Sensitivity Quotient Intensity: 0 = none, 1 = Lightheaded, 2 = Mild, 3 = Moderate, 4 = Severe, 5 = Vomiting  Intensity  1. Sitting to supine   2. Supine to L side   3. Supine to R side   4. Supine to sitting   5. L Hallpike-Dix   6. Up from L    7. R Hallpike-Dix  8. Up from R    9. Sitting, head tipped to L knee 3  10. Head up from L knee 2  11. Sitting, head tipped to R knee 3  12. Head up from R knee 2  13. Sitting head turns x5 3  14.Sitting head nods x5 3 felt it in her nose   15. In stance, 180 turn to L  3  16. In stance, 180 turn to R 3       M-CTSIB  Condition 1: Firm Surface, EO 30 Sec, Normal Sway  Condition 2: Firm Surface, EC 30 Sec, Normal Sway  Condition 3: Foam Surface, EO 30 Sec, Normal Sway  Condition 4: Foam Surface, EC 30 Sec, Mild Sway                                                                                                                                 TREATMENT DATE: 10/30/23   Self-Care: Clinical findings, POC, what vestibular PT will address, how anxiety can also contribute to dizziness   PATIENT EDUCATION: Education details: See above  Person educated: Patient Education method: Explanation Education comprehension: verbalized understanding  HOME EXERCISE PROGRAM: Will provide at future appt   GOALS: Goals reviewed with patient? Yes  SHORT TERM  GOALS: ALL STGS = LTGS   LONG TERM GOALS: Target date: 11/27/2023   Pt will perform DVA in 2 line difference or less in order to demo improved VOR.  Baseline: 3 line difference  Goal status: INITIAL  2.  Pt will report items on MSQ as 1/5 or less in order to demo improved motion sensitivity.  Baseline: 2-3/5 Goal status: INITIAL  3.  FGA to be assessed with LTG written.  Baseline:  Goal status: INITIAL    ASSESSMENT:  CLINICAL IMPRESSION: Patient is a 68 year old female referred to Neuro OPPT for dizziness.   Pt's PMH is significant for: Anxiety, R breast cancer with R breast lumpectomy (2018), hx of radiation, DVT, HLD, HTN. The following deficits were present during the exam: dizziness with saccades in the vertical direction, positive HIT to the R and 3 line difference on DVA indicating impaired VOR, motion sensitivities based on MSQ.  Pt reporting feeling better at the end of eval, so wondering if anxiety could also be contributing to pt's dizziness. Pt would benefit from skilled PT to address these impairments and functional limitations to maximize functional mobility independence and decr dizziness   OBJECTIVE IMPAIRMENTS: decreased balance, decreased mobility, and dizziness.   ACTIVITY LIMITATIONS: bending, reach over head, and locomotion level  PARTICIPATION LIMITATIONS: community activity  PERSONAL FACTORS: Behavior pattern, Past/current experiences, Time since onset of injury/illness/exacerbation, and 3+ comorbidities: Anxiety, R breast cancer with R breast lumpectomy (2018), hx of radiation, DVT, HLD, HTN are also affecting patient's functional outcome.   REHAB POTENTIAL: Good  CLINICAL DECISION MAKING: Evolving/moderate complexity  EVALUATION COMPLEXITY: Moderate   PLAN:  PT FREQUENCY: 1x/week  PT  DURATION: 4 weeks  PLANNED INTERVENTIONS: 97164- PT Re-evaluation, 97110-Therapeutic exercises, 97530- Therapeutic activity, [id number]- Neuromuscular re-education,  97535- Self Care, [id number]- Manual therapy, [phone]- Canalith repositioning, Patient/Family education, Balance training, and Vestibular training  PLAN FOR NEXT SESSION: assess FGA and write goal, initial HEP for VOR, head motions, EC balance and any other aspects of FGA    Sheffield LOISE Senate, PT, DPT 10/30/2023, 2:06 PM

## 2023-10-30 ENCOUNTER — Telehealth: Payer: Self-pay | Admitting: Cardiology

## 2023-10-30 ENCOUNTER — Encounter: Payer: Self-pay | Admitting: Physical Therapy

## 2023-10-30 ENCOUNTER — Ambulatory Visit: Attending: Otolaryngology | Admitting: Physical Therapy

## 2023-10-30 VITALS — BP 131/89 | HR 74

## 2023-10-30 DIAGNOSIS — R2681 Unsteadiness on feet: Secondary | ICD-10-CM | POA: Diagnosis not present

## 2023-10-30 DIAGNOSIS — R42 Dizziness and giddiness: Secondary | ICD-10-CM | POA: Insufficient documentation

## 2023-10-30 NOTE — Telephone Encounter (Signed)
 I spoke with patient.  She has been having fatigue. Fatigue is noted in office visit from 10/17/23 with Dr Lavona.  Patient reports fatigue mostly occurs before and after having bowel movements.  She is being treated for IBS.  She is asking if Lipitor could be causing fatigue or if dose needs to be changed.  No muscle pain.  Last lipid profile was done at [facility] office in May 2025.  I told patient Dr Lavona did not recommend any dose change at recent visit.  Patient will follow up with PCP for fatigue.

## 2023-10-30 NOTE — Telephone Encounter (Signed)
 Pt c/o medication issue:  1. Name of Medication:   atorvastatin  (LIPITOR) 20 MG tablet   2. How are you currently taking this medication (dosage and times per day)?   As prescribed  3. Are you having a reaction (difficulty breathing--STAT)?   4. What is your medication issue?   Patient stated sometimes she gets fatigued taking this medication and wants to know if she needs a lower dosage based on her last lab test results.  Patient also noted she has recently lost weight.

## 2023-11-03 ENCOUNTER — Encounter (HOSPITAL_BASED_OUTPATIENT_CLINIC_OR_DEPARTMENT_OTHER): Payer: Self-pay | Admitting: Cardiovascular Disease

## 2023-11-03 ENCOUNTER — Ambulatory Visit (HOSPITAL_BASED_OUTPATIENT_CLINIC_OR_DEPARTMENT_OTHER): Admitting: Cardiovascular Disease

## 2023-11-03 VITALS — BP 140/78 | HR 84 | Resp 17 | Ht 67.0 in | Wt 171.0 lb

## 2023-11-03 DIAGNOSIS — I10 Essential (primary) hypertension: Secondary | ICD-10-CM

## 2023-11-03 DIAGNOSIS — E785 Hyperlipidemia, unspecified: Secondary | ICD-10-CM

## 2023-11-03 NOTE — Telephone Encounter (Signed)
 Spoke with pt regarding Dr. Denver suggestions. Pt stated she told her cancer doctor about her fatigue and he had her stop a medication and the pt reports she feels much better even without stopping Lipitor. Pt will continue taking Lipitor.

## 2023-11-03 NOTE — Progress Notes (Signed)
 Advanced Hypertension Clinic Initial Assessment:    Date:  11/03/2023   ID:  [name], [name] 03-26-1955, MRN [id number]  PCP:  [name], MD  Cardiologist:  Lynwood Schilling, MD   Referring MD: Schilling Lynwood, MD   CC: Hypertension  History of Present Illness:    [name] is a 68 y.o. female with a hx of hypertension, hyeprlipidemia, mild ascending aorta aneurysm, aortic atherosclerosis, and prior DVT/PE here to establish care in the Advanced Hypertension Clinic.  She saw Dr. Schilling 10/17/23 for palpitations.  They discussed her BP and her dilated aorta.  She struggles with anxiety and planned to evaluate her aorta with CT in one year.  She was noted to have a mild murmur and plans were made for an echo in 1 year.  BP was controlled on candesartan .  Discussed the use of AI scribe software for clinical note transcription with the patient, who gave verbal consent to proceed.  History of Present Illness [name] has a history of hypertension with fluctuating blood pressure. Initially managed with a diuretic, she was later switched to candesartan  8 mg, which she reduced to 4 mg after weight loss. Her blood pressure now ranges from 120s to 140s over 80s to 90s. She monitors her blood pressure regularly and sometimes skips doses due to anxiety and fear of hypotension. She experiences anxiety related to blood pressure management.  She has a history of breast cancer, diagnosed at stage zero, and has been on medication for nearly six years. Since June 2025, she has experienced diarrhea, nausea, and fatigue, which she attributes to her cancer medication. These symptoms improved when she temporarily stopped the medication but returned upon resumption.  Her social history includes quitting smoking 20 years ago and minimal alcohol consumption. She is active, often shopping for extended periods, but does not engage in structured exercise. She has made dietary changes, reducing salt  and junk food intake, which has helped her lose weight and lower her A1c from 6.8 to 6.1.   Previous antihypertensives:    Past Medical History:  Diagnosis Date   ALLERGIC RHINITIS 03/30/2008   Qualifier: Diagnosis of  By: Norleen MD, Lynwood ORN    Anxiety    Arthritis    neck   Breast cancer (HCC) 2018   Right Breast Cancer   Deep venous thrombosis (HCC) 01/26/2021   Dysarthria 09/22/2012   GERD (gastroesophageal reflux disease)    Graves' disease 09/03/2014   Headache(784.0) 09/22/2012   History of hiatal hernia    History of radiation therapy 02/05/2017   02/05/17-03/05/17,  right breast 40.05 Gy in 15 fractions, right breast boost 10 Gy in 5 fractions   Hyperlipidemia    Hypertension    Hyperthyroidism    Iron deficiency anemia    LEG PAIN, RIGHT 04/22/2008   Qualifier: Diagnosis of  By: Norleen MD, Lynwood ORN    Low back pain 01/26/2021   Malignant tumor of breast (HCC) 11/13/2016   Palpitation 09/24/2013   Personal history of radiation therapy 2018   Right Breast Cancer   Pulmonary emboli (HCC) 11/20/2016   Uterine fibroid    Vitamin D  deficiency    Graves disease    Past Surgical History:  Procedure Laterality Date   BREAST LUMPECTOMY Right 12/16/2016   BREAST LUMPECTOMY WITH RADIOACTIVE SEED AND SENTINEL LYMPH NODE BIOPSY Right 12/16/2016   Procedure: RIGHT BREAST LUMPECTOMY WITH RADIOACTIVE SEED AND SENTINEL LYMPH NODE BIOPSY;  Surgeon: Ebbie Cough, MD;  Location: [facility]  OR;  Service: General;  Laterality: Right;   CHOLECYSTECTOMY     COLONOSCOPY     DILATION AND CURETTAGE OF UTERUS     01/2008   ESOPHAGOGASTRODUODENOSCOPY     ivc filter      Current Medications: Current Meds  Medication Sig   anastrozole  (ARIMIDEX ) 1 MG tablet Take 1 tablet (1 mg total) by mouth daily.   atorvastatin  (LIPITOR) 20 MG tablet Take 1 tablet (20 mg total) by mouth daily.   candesartan  (ATACAND ) 4 MG tablet Take 1 tablet (4 mg total) by mouth daily.   Cholecalciferol (VITAMIN D -3  PO) Take 1 capsule by mouth daily.   dicyclomine  (BENTYL ) 20 MG tablet Take 1 tablet (20 mg total) by mouth 3 (three) times daily as needed for spasms (abdominal cramping).   lubiprostone (AMITIZA) 8 MCG capsule Take 8 mcg by mouth 2 (two) times daily.   methimazole  (TAPAZOLE ) 5 MG tablet Take 1 tablet (5 mg total) by mouth as directed. Take 2 tabs on Sundays and 1 tab rest of week   Omega-3 Fatty Acids (OMEGA-3 FISH OIL PO) Take 2,000 mg by mouth.   ondansetron  (ZOFRAN -ODT) 4 MG disintegrating tablet Take 1 tablet (4 mg total) by mouth every 8 (eight) hours as needed.   pantoprazole  (PROTONIX ) 20 MG tablet Take 1 tablet (20 mg total) by mouth daily.   sertraline (ZOLOFT) 25 MG tablet Take 25 mg by mouth daily.     Allergies:   Amoxicillin-pot clavulanate and Niacin-lovastatin er   Social History   Socioeconomic History   Marital status: Single    Spouse name: Not on file   Number of children: 0   Years of education: college   Highest education level: Not on file  Occupational History   Occupation: TAX SPECIALIST    Employer: [employer]  Tobacco Use   Smoking status: Former    Current packs/day: 0.00    Average packs/day: 0.4 packs/day for 10.0 years (4.0 ttl pk-yrs)    Types: Cigarettes    Start date: 05/31/1992    Quit date: 06/01/2002    Years since quitting: 21.4   Smokeless tobacco: Never  Vaping Use   Vaping status: Never Used  Substance and Sexual Activity   Alcohol use: Yes    Comment: occasional 1 drink per month   Drug use: No   Sexual activity: Not on file  Other Topics Concern   Not on file  Social History Narrative   Lives alone.    Social Drivers of Corporate investment banker Strain: Low Risk  (11/03/2023)   Overall Financial Resource Strain (CARDIA)    Difficulty of Paying Living Expenses: Not hard at all  Food Insecurity: No Food Insecurity (11/03/2023)   Hunger Vital Sign    Worried About Running Out of Food in the Last Year: Never true    Ran Out of  Food in the Last Year: Never true  Transportation Needs: No Transportation Needs (11/03/2023)   PRAPARE - Administrator, Civil Service (Medical): No    Lack of Transportation (Non-Medical): No  Physical Activity: Inactive (11/03/2023)   Exercise Vital Sign    Days of Exercise per Week: 0 days    Minutes of Exercise per Session: 0 min  Stress: No Stress Concern Present (11/03/2023)   [employer] of Occupational Health - Occupational Stress Questionnaire    Feeling of Stress: Only a little  Social Connections: Moderately Integrated (11/03/2023)   Social Connection and Isolation Panel  Frequency of Communication with Friends and Family: More than three times a week    Frequency of Social Gatherings with Friends and Family: Patient unable to answer    Attends Religious Services: More than 4 times per year    Active Member of [employer] or Organizations: Yes    Attends Engineer, structural: More than 4 times per year    Marital Status: Divorced     Family History: The patient's family history includes Colon cancer in her maternal grandfather; Drug abuse in her brother; Hypertension in her mother.  ROS:   Please see the history of present illness.     All other systems reviewed and are negative.  EKGs/Labs/Other Studies Reviewed:    EKG:  EKG is not ordered today.   Recent Labs: 09/28/2023: ALT 19; BUN 10; Creatinine, Ser 0.89; Potassium 3.9; Sodium 136 10/08/2023: Hemoglobin 12.9; Platelets 142 10/17/2023: TSH 1.38   Recent Lipid Panel    Component Value Date/Time   CHOL 153 01/26/2021 1025   TRIG 103 01/26/2021 1025   HDL 49 01/26/2021 1025   CHOLHDL 3.1 01/26/2021 1025   CHOLHDL 4.4 04/06/2010 1037   VLDL 16 04/06/2010 1037   LDLCALC 85 01/26/2021 1025    Physical Exam:   VS:  BP (!) 140/78 (BP Location: Left Arm, Patient Position: Sitting, Cuff Size: Normal)   Pulse 84   Resp 17   Ht 5' 7 (1.702 m)   Wt 171 lb (77.6 kg)   LMP 06/18/2010   SpO2  97%   BMI 26.78 kg/m  , BMI Body mass index is 26.78 kg/m. GENERAL:  Well appearing HEENT: Pupils equal round and reactive, fundi not visualized, oral mucosa unremarkable NECK:  No jugular venous distention, waveform within normal limits, carotid upstroke brisk and symmetric, no bruits, no thyromegaly LUNGS:  Clear to auscultation bilaterally HEART:  RRR.  PMI not displaced or sustained,S1 and S2 within normal limits, no S3, no S4, no clicks, no rubs, no murmurs ABD:  Flat, positive bowel sounds normal in frequency in pitch, no bruits, no rebound, no guarding, no midline pulsatile mass, no hepatomegaly, no splenomegaly EXT:  2 plus pulses throughout, no edema, no cyanosis no clubbing SKIN:  No rashes no nodules NEURO:  Cranial nerves II through XII grossly intact, motor grossly intact throughout PSYCH:  Cognitively intact, oriented to person place and time   ASSESSMENT/PLAN:    Assessment & Plan # Primary hypertension Blood pressure fluctuates between 120s to 140s/80s to 90s. Anxiety may contribute to variability. Concerns about hypotension due to anxiety medication and vasovagal episode. - Continue candesartan , adjust dosage based on blood pressure readings. - Encourage regular blood pressure monitoring and logging. - Promote lifestyle modifications including regular exercise and reduced salt intake. - Provide educational materials on hypertension management. - Reassured about occasional low blood pressure readings unless symptomatic. -Check urine albumin/Cr ratio  # Aortic atherosclerosis Presence of plaque in the aorta, indicating risk for smaller artery plaque formation. Aspirin  recommended for cardiovascular protection despite fatty liver diagnosis. - Continue baby aspirin  for cardiovascular protection. - Continue atorvastatin .  Check fasting lipids/CMP in December given inconsistent statin use.    # Hyperlipidemia Cholesterol levels previously checked in May, LDL at 87.  Atorvastatin  was temporarily discontinued due to side effects concerns but has been resumed. Need for updated lipid profile to assess current status. - Order fasting lipid panel in December to reassess cholesterol levels. - Continue atorvastatin  20 mg daily. - Check LP(a)  # Venous thromboembolism (  leg) Previous large blood clot in the leg, possibly related to oral contraceptive use and prolonged travel. No current anticoagulation therapy. Aspirin  use discussed with consideration of cardiovascular risk factors. - Consult with hematologist regarding long-term aspirin  use for VTE history. - Continue baby aspirin  for cardiovascular protection.  # Anxiety Anxiety contributing to blood pressure variability. Recent vasovagal episode possibly related to anxiety and medication effects. - Encourage regular exercise as a stress reliever. - Monitor for any symptoms of anxiety affecting blood pressure.    Screening for Secondary Hypertension:     11/03/2023   10:00 AM  Causes  Drugs/Herbals Screened     - Comments limits salt.  limits caffeine.  No EtOH.  quit tob  Renovascular HTN N/A  Sleep Apnea Screened     - Comments no symptoms  Thyroid  Disease Screened  Hyperaldosteronism N/A  Pheochromocytoma N/A  Cushing's Syndrome N/A  Hyperparathyroidism N/A  Coarctation of the Aorta Screened     - Comments BP symmetric  Compliance Screened    Relevant Labs/Studies:    Latest Ref Rng & Units 09/28/2023    2:14 PM 08/14/2023   11:58 AM 08/07/2023   11:53 AM  Basic Labs  Sodium 135 - 145 mmol/L 136  140  137   Potassium 3.5 - 5.1 mmol/L 3.9  3.9  3.4   Creatinine 0.44 - 1.00 mg/dL 9.10  9.03  9.03        Latest Ref Rng & Units 10/17/2023    3:20 PM 08/22/2023    1:40 PM  Thyroid    TSH 0.40 - 4.50 mIU/L 1.38  1.90      Disposition:    FU with MD/PharmD in 3 months    Medication Adjustments/Labs and Tests Ordered: Current medicines are reviewed at length with the patient today.   Concerns regarding medicines are outlined above.  No orders of the defined types were placed in this encounter.  No orders of the defined types were placed in this encounter.    Signed, Annabella Scarce, MD  11/03/2023 10:20 AM    [facility]

## 2023-11-03 NOTE — Patient Instructions (Signed)
 Medication Instructions:  TAKE YOUR CANDESARTAN  DAILY AT THE SAME TIME EACH DAY   Labwork: FASTING LP/CMET/URINE ALBUMIN RATION IN DECEMBER ABOUT A WEEK PRIOR TO FOLLOW UP   Testing/Procedures: NONE   Follow-Up: IN DECEMBER WITH DR Lightstreet, CAITLIN W NP, PHARM D   Any Other Special Instructions Will Be Listed Below (If Applicable).     If you need a refill on your cardiac medications before your next appointment, please call your pharmacy.

## 2023-11-04 ENCOUNTER — Other Ambulatory Visit: Payer: Federal, State, Local not specified - PPO

## 2023-11-04 ENCOUNTER — Telehealth: Payer: Self-pay

## 2023-11-04 NOTE — Telephone Encounter (Signed)
 Pt called and states since restarting the Anastrozole  all sx she was previously having started back.  She is reluctant to completely stop as she is fearful of recurrence or metastasis. Per MD, it is OK for her to stop AI therapy based on her dx and prognosis per oncotype DX.  D/t her hx of DVT, she is not a candidate for tamoxifen and [name] recommends exemestane if she does not want to stop AI. She has completed 5.5 years and it is recommended for her to not exceed 7 years per [name] and her previous oncologist, [name].  [name] is anxious about stopping and asked if her cancer will recur if she stops. Advised pt we are not able to guarantee that her cancer will never recur whether she is on OR off AI therapy. She would like to think about this and further discuss at her f/u with Laura Kendall, DNP.

## 2023-11-05 NOTE — Telephone Encounter (Signed)
 BCI ordered and faxed. Pt is aware and agreeable.

## 2023-11-06 NOTE — Telephone Encounter (Signed)
 Can we identify when this patient's BCI testing will result?  It will be good to have the results prior to her 10/9 appointment.

## 2023-11-07 ENCOUNTER — Ambulatory Visit: Admitting: Physical Therapy

## 2023-11-10 ENCOUNTER — Encounter: Payer: Self-pay | Admitting: Internal Medicine

## 2023-11-10 ENCOUNTER — Ambulatory Visit: Admitting: Internal Medicine

## 2023-11-10 VITALS — BP 120/80 | HR 62 | Ht 67.0 in | Wt 171.8 lb

## 2023-11-10 DIAGNOSIS — E041 Nontoxic single thyroid nodule: Secondary | ICD-10-CM

## 2023-11-10 DIAGNOSIS — E0500 Thyrotoxicosis with diffuse goiter without thyrotoxic crisis or storm: Secondary | ICD-10-CM

## 2023-11-10 DIAGNOSIS — E0590 Thyrotoxicosis, unspecified without thyrotoxic crisis or storm: Secondary | ICD-10-CM | POA: Diagnosis not present

## 2023-11-10 MED ORDER — METHIMAZOLE 5 MG PO TABS: 5.0000 mg | ORAL_TABLET | ORAL | 2 refills | Status: AC

## 2023-11-10 NOTE — Telephone Encounter (Signed)
 [name], does that mean the results will return before her 10/9 appt?  If not, we can see about moving the appt back.

## 2023-11-10 NOTE — Progress Notes (Signed)
 Name: [name]  MRN/ DOB: [id number], Aug 27, 1955    Age/ Sex: 68 y.o., female     PCP: [name], MD   Reason for Endocrinology Evaluation: [name]      Initial Endocrinology Clinic Visit: 02/16/2020    PATIENT IDENTIFIER: [name] is a 68 y.o., female with a past medical history of Hx of breast ca ( S/P right lumpectomy 2018 and radiation ) and Graves' Allison . She has followed with [facility] since 02/16/2020 for consultative assistance with management of her hyperthyroidism.   HISTORICAL SUMMARY:   She has been diagnosed with Graves' Allison in 2016. Had a thyroid  uptake and scan on 08/13/2014 at 57.5 % of I-131 , uniform uptake consistent with Graves's Allison. Has been on Methimazole  since her diagnosis.    SUBJECTIVE:    Today (11/10/2023):  [name] is here for a follow up on Graves Allison .  She continues to follow-up with oncology for history of breast cancer  Patient follows with GI, she had a recent liver biopsy through Dr. Nola office with steatohepatitis and fibrosis  She is accompanied by her friend Angeline   She had an episode of vasovagal attack upon her return from [place].  She was seen by cardiology, this was attributed to anxiety and medication effect.  Anastrozole  was discontinued She is feeling fine at this time The episode was described as feeling near syncope with nausea and diarrhea episode She has been noted with weight loss, patient attributes this to eating healthy food and avoiding snacks Denies local neck swelling   No recent palpitations except when she had a vasovagal episode and was anxious       HOME ENDOCRINE MEDICATION  Methimazole  5 mg daily ,2 tabs on Sunday and 1 tab rest of the week     HISTORY:  Past Medical History:  Past Medical History:  Diagnosis Date   ALLERGIC RHINITIS 03/30/2008   Qualifier: Diagnosis of  By: Norleen MD, Lynwood ORN    Anxiety    Arthritis    neck    Breast cancer (HCC) 2018   Right Breast Cancer   Deep venous thrombosis (HCC) 01/26/2021   Dysarthria 09/22/2012   GERD (gastroesophageal reflux Allison)    Graves' Allison 09/03/2014   Headache(784.0) 09/22/2012   History of hiatal hernia    History of radiation therapy 02/05/2017   02/05/17-03/05/17,  right breast 40.05 Gy in 15 fractions, right breast boost 10 Gy in 5 fractions   Hyperlipidemia    Hypertension    Hyperthyroidism    Iron deficiency anemia    LEG PAIN, RIGHT 04/22/2008   Qualifier: Diagnosis of  By: Norleen MD, Lynwood ORN    Low back pain 01/26/2021   Malignant tumor of breast (HCC) 11/13/2016   Palpitation 09/24/2013   Personal history of radiation therapy 2018   Right Breast Cancer   Pulmonary emboli (HCC) 11/20/2016   Uterine fibroid    Vitamin D  deficiency    Graves Allison   Past Surgical History:  Past Surgical History:  Procedure Laterality Date   BREAST LUMPECTOMY Right 12/16/2016   BREAST LUMPECTOMY WITH RADIOACTIVE SEED AND SENTINEL LYMPH NODE BIOPSY Right 12/16/2016   Procedure: RIGHT BREAST LUMPECTOMY WITH RADIOACTIVE SEED AND SENTINEL LYMPH NODE BIOPSY;  Surgeon: Ebbie Cough, MD;  Location: [facility] OR;  Service: General;  Laterality: Right;   CHOLECYSTECTOMY     COLONOSCOPY     DILATION AND CURETTAGE OF UTERUS  01/2008   ESOPHAGOGASTRODUODENOSCOPY     ivc filter     Social History:  reports that she quit smoking about 21 years ago. Her smoking use included cigarettes. She started smoking about 31 years ago. She has a 4 pack-year smoking history. She has never used smokeless tobacco. She reports current alcohol use. She reports that she does not use drugs. Family History:  Family History  Problem Relation Age of Onset   Hypertension Mother    Drug abuse Brother    Colon cancer Maternal Grandfather      HOME MEDICATIONS: Allergies as of 11/10/2023       Reactions   Amoxicillin-pot Clavulanate Other (See Comments)   Dizziness    Niacin-lovastatin Er Other (See Comments)   Caused flushing        Medication List        Accurate as of November 10, 2023  1:24 PM. If you have any questions, ask your nurse or doctor.          anastrozole  1 MG tablet Commonly known as: ARIMIDEX  Take 1 tablet (1 mg total) by mouth daily.   aspirin  EC 81 MG tablet Take 1 tablet (81 mg total) by mouth daily.   atorvastatin  20 MG tablet Commonly known as: LIPITOR Take 1 tablet (20 mg total) by mouth daily.   candesartan  4 MG tablet Commonly known as: ATACAND  Take 1 tablet (4 mg total) by mouth daily.   dicyclomine  20 MG tablet Commonly known as: BENTYL  Take 1 tablet (20 mg total) by mouth 3 (three) times daily as needed for spasms (abdominal cramping).   hydrALAZINE  10 MG tablet Commonly known as: APRESOLINE  Take 0.5 tablets (5 mg total) by mouth as needed (for diastolic greater than 85 for up to 2 doses.).   lubiprostone 8 MCG capsule Commonly known as: AMITIZA Take 8 mcg by mouth 2 (two) times daily.   methimazole  5 MG tablet Commonly known as: TAPAZOLE  Take 1 tablet (5 mg total) by mouth as directed. Take 2 tabs on Sundays and 1 tab rest of week   OMEGA-3 FISH OIL PO Take 2,000 mg by mouth.   ondansetron  4 MG disintegrating tablet Commonly known as: ZOFRAN -ODT Take 1 tablet (4 mg total) by mouth every 8 (eight) hours as needed.   pantoprazole  20 MG tablet Commonly known as: PROTONIX  Take 1 tablet (20 mg total) by mouth daily.   sertraline 25 MG tablet Commonly known as: ZOLOFT Take 25 mg by mouth daily.   VITAMIN D -3 PO Take 1 capsule by mouth daily.          OBJECTIVE:   PHYSICAL EXAM: VS: BP 120/80 (BP Location: Left Arm, Patient Position: Sitting, Cuff Size: Normal)   Pulse 62   Ht 5' 7 (1.702 m)   Wt 171 lb 12.8 oz (77.9 kg)   LMP 06/18/2010   SpO2 98%   BMI 26.91 kg/m     EXAM: General: Pt appears well and is in NAD  Neck: General: Supple without adenopathy. Thyroid :  Thyroid  asymmetry noted on exam today. No thyroid  bruit.  Lungs: Clear with good BS bilat   Heart: Auscultation: RRR.  Abdomen: soft, nontender  Mental Status: Judgment, insight: Intact Orientation: Oriented to time, place, and person Mood and affect: No depression, anxiety, or agitation     DATA REVIEWED:    Latest Reference Range & Units 10/17/23 15:20  TSH 0.40 - 4.50 mIU/L 1.38  T4,Free(Direct) 0.8 - 1.8 ng/dL 1.4    Latest Reference Range &  Units 03/04/23 13:38  Bone Isoenzymes 28 - 66 % 64  Intestinal Isoenzymes 1 - 24 % 0 (L)  Liver Isoenzymes 25 - 69 % 36    Thyroid  ultrasound 03/27/2023 Estimated total number of nodules >/= 1 cm: 1   Number of spongiform nodules >/=  2 cm not described below (TR1): 0   Number of mixed cystic and solid nodules >/= 1.5 cm not described below (TR2): 0   _________________________________________________________   Nodule # 1:   Prior biopsy: No   Location: Right; Inferior   Maximum size: 1.8 cm; Other 2 dimensions: 1.7 x 1.6 cm, previously, 1.6 x 1.4 x 1.3 cm   Composition: solid/almost completely solid (2)   Echogenicity: isoechoic (1)   Shape: not taller-than-wide (0)   Margins: ill-defined (0)   Echogenic foci: none (0)   ACR TI-RADS total points: 3.   ACR TI-RADS risk category:  TR3 (3 points).   Significant change in size (>/= 20% in two dimensions and minimal increase of 2 mm): No   Change in features: No   Change in ACR TI-RADS risk category: No   ACR TI-RADS recommendations:   *Given size (>/= 1.5 - 2.4 cm) and appearance, a follow-up ultrasound in 1 year should be considered based on TI-RADS criteria.   _________________________________________________________   No cervical lymphadenopathy.   IMPRESSION: 1. Similar appearing mildly enlarged moderately heterogeneous thyroid  gland in keeping with history of Graves Allison. 2. Similar appearance of previously visualized right inferior solid thyroid   nodule (labeled 1, 1.8 cm, previously 1.6 cm) which again meets criteria (TI-RADS category 3) for 1 year ultrasound surveillance. This study marks 1 year stability.    ASSESSMENT / PLAN / RECOMMENDATIONS:    Hyperthyroidism secondary to Graves' Allison:    -Patient is clinically euthyroid - Has been on methimazole  since 2016 -Recent TFTs are normal, no change  Medications : Continue  methimazole  5 mg, take 2 tablets on Sundays, 1 tablet rest of the week    2. Graves' Allison:     No extra-thyroidal manifestations of graves' Allison  3.  MNG  -Thyroid  ultrasound 04/2022 revealed 1.6 cm right thyroid  nodule met criteria for surveillance - No local neck symptoms  - Up to date on thyroid  ultrasound     Follow-up in 6 months    Signed electronically by: Stefano Redgie Butts, MD  [facility]  [facility] [address] [place] [place], [place] [address] Phone: [phone] FAX: [phone]      CC: [name], MD [address] [place] [address] Phone: [phone]  Fax: [phone]   Return to Endocrinology clinic as below: Future Appointments  Date Time Provider Department Center  11/12/2023  3:30 PM Annett Sheffield SAILOR, [place] [facility]  11/19/2023  3:30 PM Annett Sheffield SAILOR, PT [facility]  11/20/2023  1:20 PM Crawford Morna Pickle, NP [facility] None  11/26/2023  1:00 PM [facility] MM 3 [facility]  11/26/2023  2:45 PM Annett Sheffield SAILOR, PT [facility]  01/20/2024  1:30 PM Loretha Ash, MD [facility] None  01/28/2024  3:30 PM Raford Riggs, MD [facility] [phone] [place]

## 2023-11-10 NOTE — Telephone Encounter (Signed)
 Spoke with pt regarding BCI testing. I contacted BCI for estimate on time for results. They state the pt needs to call them to discuss financials before they can proceed with the test. They state pt will owe $600 out of pocket for this test but they have options available to help her out, but she needs to call. Provided pt with BCI contact info to call. She will let us  know if she decides not to move forward with testing d/t financials, but still wishes to come in 10/9 to discuss whether or not she should continue AI therapy. Message routed to NP.

## 2023-11-12 ENCOUNTER — Ambulatory Visit: Attending: Otolaryngology | Admitting: Physical Therapy

## 2023-11-12 ENCOUNTER — Encounter: Payer: Self-pay | Admitting: Physical Therapy

## 2023-11-12 VITALS — BP 131/80 | HR 58

## 2023-11-12 DIAGNOSIS — R42 Dizziness and giddiness: Secondary | ICD-10-CM | POA: Insufficient documentation

## 2023-11-12 DIAGNOSIS — R2681 Unsteadiness on feet: Secondary | ICD-10-CM | POA: Diagnosis not present

## 2023-11-12 NOTE — Therapy (Signed)
 OUTPATIENT PHYSICAL THERAPY VESTIBULAR TREATMENT     Patient Name: [name] MRN: [id number] DOB:03/24/1955, 68 y.o., female Today's Date: 11/12/2023  END OF SESSION:  PT End of Session - 11/12/23 1535     Visit Number 2    Number of Visits 5    Date for Recertification  11/29/23    Authorization Type BLUE CROSS BLUE [employer]IELD    PT Start Time 1533    PT Stop Time 1612    PT Time Calculation (min) 39 min    Equipment Utilized During Treatment Gait belt    Activity Tolerance Patient tolerated treatment well    Behavior During Therapy WFL for tasks assessed/performed          Past Medical History:  Diagnosis Date   ALLERGIC RHINITIS 03/30/2008   Qualifier: Diagnosis of  By: Norleen MD, Lynwood ORN    Anxiety    Arthritis    neck   Breast cancer (HCC) 2018   Right Breast Cancer   Deep venous thrombosis (HCC) 01/26/2021   Dysarthria 09/22/2012   GERD (gastroesophageal reflux disease)    Graves' disease 09/03/2014   Headache(784.0) 09/22/2012   History of hiatal hernia    History of radiation therapy 02/05/2017   02/05/17-03/05/17,  right breast 40.05 Gy in 15 fractions, right breast boost 10 Gy in 5 fractions   Hyperlipidemia    Hypertension    Hyperthyroidism    Iron deficiency anemia    LEG PAIN, RIGHT 04/22/2008   Qualifier: Diagnosis of  By: Norleen MD, Lynwood ORN    Low back pain 01/26/2021   Malignant tumor of breast (HCC) 11/13/2016   Palpitation 09/24/2013   Personal history of radiation therapy 2018   Right Breast Cancer   Pulmonary emboli (HCC) 11/20/2016   Uterine fibroid    Vitamin D  deficiency    Graves disease   Past Surgical History:  Procedure Laterality Date   BREAST LUMPECTOMY Right 12/16/2016   BREAST LUMPECTOMY WITH RADIOACTIVE SEED AND SENTINEL LYMPH NODE BIOPSY Right 12/16/2016   Procedure: RIGHT BREAST LUMPECTOMY WITH RADIOACTIVE SEED AND SENTINEL LYMPH NODE BIOPSY;  Surgeon: Ebbie Cough, MD;  Location: [facility] OR;  Service: General;   Laterality: Right;   CHOLECYSTECTOMY     COLONOSCOPY     DILATION AND CURETTAGE OF UTERUS     01/2008   ESOPHAGOGASTRODUODENOSCOPY     ivc filter     Patient Active Problem List   Diagnosis Date Noted   Right thyroid  nodule 10/15/2022   Pre-diabetes 07/04/2021   Wellness examination 03/06/2021   Graves disease 03/02/2021   Dizziness 03/02/2021   History of pulmonary embolism 01/26/2021   Uterine leiomyoma 01/26/2021   Vitamin D  deficiency 01/26/2021   Dyslipidemia 11/08/2018   S/P insertion of inferior vena caval filter 08/04/2018   Systolic murmur 07/10/2018   History of DVT (deep vein thrombosis) 02/16/2018   History of breast cancer 02/16/2018   Malignant neoplasm of lower-outer quadrant of right breast of female, estrogen receptor positive (HCC) 11/19/2016   Alkaline phosphatase raised 10/29/2015   Anxiety 10/04/2014   Hyperthyroidism 09/03/2014   Vitamin B 12 deficiency 04/19/2008   ANEMIA, IRON DEFICIENCY 04/19/2008   Hypertensive disorder 03/30/2008   GERD 03/30/2008    PCP: [name], MD  REFERRING PROVIDER: Karis Clunes, MD  REFERRING DIAG: R42 (ICD-10-CM) - Dizziness  THERAPY DIAG:  Dizziness and giddiness  Unsteadiness on feet  ONSET DATE: 09/13/2023  Rationale for Evaluation and Treatment: Rehabilitation  SUBJECTIVE:   SUBJECTIVE  STATEMENT: Had some dizziness standing still while she was cooking. Felt like she was dizzy in her head and felt a little fatigued. Takes her anxiety medication at night - has been on it for a couple months. Was wondering if her dizziness could be related to her anxiety medications. Cardiologist wants her to stop her cholesterol medication for 30 days to see if it is contributing to her dizziness or fatigue.   Pt accompanied by: self  PERTINENT HISTORY: PMH: Anxiety, R breast cancer with R breast lumpectomy (2018), hx of radiation, DVT, HLD, HTN  Per Dr. Karis: She has been symptomatic for several years. She describes  her dizziness as an intermittent lightheaded and off-balance sensation. The dizziness is often triggered when she gets up quickly from a sitting or supine position. She had an accidental fall in her kitchen 2 months ago.   PAIN:  Are you having pain? No  Vitals:   11/12/23 1540  BP: 131/80  Pulse: (!) 58    PRECAUTIONS: Fall  FALLS: Has patient fallen in last 6 months? Yes. Number of falls 1   PLOF: Independent  PATIENT GOALS: My ENT signed me up, doesn't want to feel dizzy and improve her balance   OBJECTIVE:  Note: Objective measures were completed at Evaluation unless otherwise noted. COGNITION: Overall cognitive status: Within functional limits for tasks assessed  GAIT: Gait pattern: WFL Distance walked: Clinic distances  Assistive device utilized: None Level of assistance: Complete Independence Comments: No unsteadiness noted during eval    VESTIBULAR ASSESSMENT:  GENERAL OBSERVATION: Ambulates in with no AD independently   SYMPTOM BEHAVIOR:  Subjective history: See above Non-Vestibular symptoms: nausea   Type of dizziness: Lightheadedness/Faint and feels foggy brained   Frequency: Few times a week  Duration: Normally they don't last all day, if she relaxes or goes to sleep normally they wear off   Aggravating factors: Induced by position change: supine to sit and Induced by motion: bending down to the ground, turning body quickly, and turning head quickly  Relieving factors: lying supine and staying still   Progression of symptoms: better, notes sometimes the dizziness will get better   OCULOMOTOR EXAM:  Ocular Alignment: normal  Ocular ROM: No Limitations  Spontaneous Nystagmus: absent  Gaze-Induced Nystagmus: absent  Smooth Pursuits: intact  Saccades: intact and pt reporting dizziness in vertical superior and inferior directions   VESTIBULAR - OCULAR REFLEX:   Slow VOR: Normal  VOR Cancellation: Normal, dizziness and reporting feeling it in her  stomach   Head-Impulse Test: HIT Right: positive HIT Left: negative Pt reporting dizziness afterwards   Dynamic Visual Acuity: Static: Line 8 Dynamic: Line 5 3 line difference, Pt reporting feeling dizziness during    MOTION SENSITIVITY:  Motion Sensitivity Quotient Intensity: 0 = none, 1 = Lightheaded, 2 = Mild, 3 = Moderate, 4 = Severe, 5 = Vomiting  Intensity  1. Sitting to supine   2. Supine to L side   3. Supine to R side   4. Supine to sitting   5. L Hallpike-Dix   6. Up from L    7. R Hallpike-Dix   8. Up from R    9. Sitting, head tipped to L knee 3  10. Head up from L knee 2  11. Sitting, head tipped to R knee 3  12. Head up from R knee 2  13. Sitting head turns x5 3  14.Sitting head nods x5 3 felt it in her nose   15. In stance,  180 turn to L  3  16. In stance, 180 turn to R 3       M-CTSIB  Condition 1: Firm Surface, EO 30 Sec, Normal Sway  Condition 2: Firm Surface, EC 30 Sec, Normal Sway  Condition 3: Foam Surface, EO 30 Sec, Normal Sway  Condition 4: Foam Surface, EC 30 Sec, Mild Sway                                                                                                                                 TREATMENT DATE: 11/12/23  Therapeutic Activity: Vitals:   11/12/23 1540  BP: 131/80  Pulse: (!) 58     [facility] PT Assessment - 11/12/23 1541       Functional Gait  Assessment   Gait assessed  Yes    Gait Level Surface Walks 20 ft in less than 7 sec but greater than 5.5 sec, uses assistive device, slower speed, mild gait deviations, or deviates 6-10 in outside of the 12 in walkway width.   6.3 seconds   Change in Gait Speed Able to smoothly change walking speed without loss of balance or gait deviation. Deviate no more than 6 in outside of the 12 in walkway width.    Gait with Horizontal Head Turns Performs head turns smoothly with no change in gait. Deviates no more than 6 in outside 12 in walkway width   feeling a little dizzy   Gait with  Vertical Head Turns Performs task with slight change in gait velocity (eg, minor disruption to smooth gait path), deviates 6 - 10 in outside 12 in walkway width or uses assistive device    Gait and Pivot Turn Pivot turns safely within 3 sec and stops quickly with no loss of balance.    Step Over Obstacle Is able to step over 2 stacked shoe boxes taped together (9 in total height) without changing gait speed. No evidence of imbalance.    Gait with Narrow Base of Support Is able to ambulate for 10 steps heel to toe with no staggering.    Gait with Eyes Closed Walks 20 ft, uses assistive device, slower speed, mild gait deviations, deviates 6-10 in outside 12 in walkway width. Ambulates 20 ft in less than 9 sec but greater than 7 sec.   veering to R   Ambulating Backwards Walks 20 ft, no assistive devices, good speed, no evidence for imbalance, normal gait    Steps Alternating feet, no rail.    Total Score 27    FGA comment: 27/30 = Low Fall Risk          Initiated HEP:   Gaze Adaptation: x1 Viewing Horizontal: Position: Standing, Time: 30 seconds, Reps: 2, and Comment: slight dizziness afterwards 1-2/10 x1 Viewing Vertical:  Position: Standing, Time: 30 seconds, Reps: 2, and Comment: some dizziness 3-4/10   Access Code: [email or web address] URL: [email or web address] Date: 11/12/2023 Prepared by: Sheffield Senate  Exercises -  Romberg Stance Eyes Closed on Foam Pad  - 1-2 x daily - 5 x weekly - 2 sets - 10 reps - performed 2 x 10 reps head turns, 2 x 10 reps head nods  - Half Tandem Stance on Foam Pad  - 1-2 x daily - 5 x weekly - 2 sets - 10 reps - performed 2 x 10 reps head nods, performed 10 reps head turns with each leg posteriorly   Intermittent unsteadiness with balance tasks   PATIENT EDUCATION: Education details: results of FGA, initial HEP  Person educated: Patient Education method: Explanation, Demonstration, Verbal cues, and Handouts Education comprehension: verbalized  understanding and returned demonstration  HOME EXERCISE PROGRAM: Standing VOR x30 seconds  Access Code: [email or web address] URL: [email or web address] Date: 11/12/2023 Prepared by: Sheffield Senate  Exercises - Romberg Stance Eyes Closed on Foam Pad  - 1-2 x daily - 5 x weekly - 2 sets - 10 reps - Half Tandem Stance on Foam Pad  - 1-2 x daily - 5 x weekly - 2 sets - 10 reps  GOALS: Goals reviewed with patient? Yes  SHORT TERM GOALS: ALL STGS = LTGS   LONG TERM GOALS: Target date: 11/27/2023   Pt will perform DVA in 2 line difference or less in order to demo improved VOR.  Baseline: 3 line difference  Goal status: INITIAL  2.  Pt will report items on MSQ as 1/5 or less in order to demo improved motion sensitivity.  Baseline: 2-3/5 Goal status: INITIAL  3.  FGA to be assessed with LTG written.  Baseline: 27/30  Goal status N/A - goal not needed   ASSESSMENT:  CLINICAL IMPRESSION: Assessed FGA with pt scoring a 27/30, indicating a low fall risk. LTG not needed. Pt only challenged with EC and gait with head nods. Remainder of session focused on initiating HEP for balance with EC, head motions, and standing VOR. Pt with some dizziness with standing VOR, more so with vertical direction instead of horizontal. Will continue per POC.   OBJECTIVE IMPAIRMENTS: decreased balance, decreased mobility, and dizziness.   ACTIVITY LIMITATIONS: bending, reach over head, and locomotion level  PARTICIPATION LIMITATIONS: community activity  PERSONAL FACTORS: Behavior pattern, Past/current experiences, Time since onset of injury/illness/exacerbation, and 3+ comorbidities: Anxiety, R breast cancer with R breast lumpectomy (2018), hx of radiation, DVT, HLD, HTN are also affecting patient's functional outcome.   REHAB POTENTIAL: Good  CLINICAL DECISION MAKING: Evolving/moderate complexity  EVALUATION COMPLEXITY: Moderate   PLAN:  PT FREQUENCY: 1x/week  PT DURATION: 4  weeks  PLANNED INTERVENTIONS: 97164- PT Re-evaluation, 97110-Therapeutic exercises, 97530- Therapeutic activity, 97112- Neuromuscular re-education, 97535- Self Care, [id number]- Manual therapy, [phone]- Canalith repositioning, Patient/Family education, Balance training, and Vestibular training  PLAN FOR NEXT SESSION: work on progressing VOR, head motions, EC balance   Sheffield LOISE Senate, PT, DPT 11/12/2023, 4:14 PM

## 2023-11-12 NOTE — Patient Instructions (Signed)
 Gaze Stabilization: Standing Feet Apart    Feet shoulder width apart, keeping eyes on target on wall __a few__ feet away, tilt head down 15-30 and move head side to side for _30___ seconds. Repeat while moving head up and down for _30___ seconds. Per 2-3 sets of each. Do __2__ sessions per day.   Gaze Stabilization: Tip Card  1.Target must remain in focus, not blurry, and appear stationary while head is in motion. 2.Perform exercises with small head movements (45 to either side of midline). 3.Increase speed of head motion so long as target is in focus. 4.If you wear eyeglasses, be sure you can see target through lens (therapist will give specific instructions for bifocal / progressive lenses). 5.These exercises may provoke dizziness or nausea. Work through these symptoms. If too dizzy, slow head movement slightly. Rest between each exercise. 6.Exercises demand concentration; avoid distractions. 7.For safety, perform standing exercises close to a counter, wall, corner, or next to someone.

## 2023-11-19 ENCOUNTER — Encounter: Payer: Self-pay | Admitting: Physical Therapy

## 2023-11-19 ENCOUNTER — Ambulatory Visit: Admitting: Physical Therapy

## 2023-11-19 ENCOUNTER — Encounter: Payer: Self-pay | Admitting: Adult Health

## 2023-11-19 VITALS — BP 151/88 | HR 60

## 2023-11-19 DIAGNOSIS — R42 Dizziness and giddiness: Secondary | ICD-10-CM

## 2023-11-19 DIAGNOSIS — R2681 Unsteadiness on feet: Secondary | ICD-10-CM

## 2023-11-19 NOTE — Therapy (Signed)
 OUTPATIENT PHYSICAL THERAPY VESTIBULAR TREATMENT     Patient Name: [name] MRN: [id number] DOB:1955-03-10, 68 y.o., female Today's Date: 11/19/2023  END OF SESSION:  PT End of Session - 11/19/23 1534     Visit Number 3    Number of Visits 5    Date for Recertification  11/29/23    Authorization Type [employer]    PT Start Time 1533    PT Stop Time 1614   3 minutes non-billable due to pt using the restroom   PT Time Calculation (min) 41 min    Equipment Utilized During Treatment --    Activity Tolerance Patient tolerated treatment well    Behavior During Therapy WFL for tasks assessed/performed          Past Medical History:  Diagnosis Date   ALLERGIC RHINITIS 03/30/2008   Qualifier: Diagnosis of  By: Norleen MD, [place] ORN    Anxiety    Arthritis    neck   Breast cancer (HCC) 2018   Right Breast Cancer   Deep venous thrombosis (HCC) 01/26/2021   Dysarthria 09/22/2012   GERD (gastroesophageal reflux disease)    Graves' disease 09/03/2014   Headache(784.0) 09/22/2012   History of hiatal hernia    History of radiation therapy 02/05/2017   02/05/17-03/05/17,  right breast 40.05 Gy in 15 fractions, right breast boost 10 Gy in 5 fractions   Hyperlipidemia    Hypertension    Hyperthyroidism    Iron deficiency anemia    LEG PAIN, RIGHT 04/22/2008   Qualifier: Diagnosis of  By: Norleen MD, [place] ORN    Low back pain 01/26/2021   Malignant tumor of breast (HCC) 11/13/2016   Palpitation 09/24/2013   Personal history of radiation therapy 2018   Right Breast Cancer   Pulmonary emboli (HCC) 11/20/2016   Uterine fibroid    Vitamin D  deficiency    Graves disease   Past Surgical History:  Procedure Laterality Date   BREAST LUMPECTOMY Right 12/16/2016   BREAST LUMPECTOMY WITH RADIOACTIVE SEED AND SENTINEL LYMPH NODE BIOPSY Right 12/16/2016   Procedure: RIGHT BREAST LUMPECTOMY WITH RADIOACTIVE SEED AND SENTINEL LYMPH NODE BIOPSY;  Surgeon: Ebbie Cough, MD;   Location: [facility] OR;  Service: General;  Laterality: Right;   CHOLECYSTECTOMY     COLONOSCOPY     DILATION AND CURETTAGE OF UTERUS     01/2008   ESOPHAGOGASTRODUODENOSCOPY     ivc filter     Patient Active Problem List   Diagnosis Date Noted   Right thyroid  nodule 10/15/2022   Pre-diabetes 07/04/2021   Wellness examination 03/06/2021   Graves disease 03/02/2021   Dizziness 03/02/2021   History of pulmonary embolism 01/26/2021   Uterine leiomyoma 01/26/2021   Vitamin D  deficiency 01/26/2021   Dyslipidemia 11/08/2018   S/P insertion of inferior vena caval filter 08/04/2018   Systolic murmur 07/10/2018   History of DVT (deep vein thrombosis) 02/16/2018   History of breast cancer 02/16/2018   Malignant neoplasm of lower-outer quadrant of right breast of female, estrogen receptor positive (HCC) 11/19/2016   Alkaline phosphatase raised 10/29/2015   Anxiety 10/04/2014   Hyperthyroidism 09/03/2014   Vitamin B 12 deficiency 04/19/2008   ANEMIA, IRON DEFICIENCY 04/19/2008   Hypertensive disorder 03/30/2008   GERD 03/30/2008    PCP: [name], MD  REFERRING PROVIDER: Karis Clunes, MD  REFERRING DIAG: R42 (ICD-10-CM) - Dizziness  THERAPY DIAG:  Dizziness and giddiness  Unsteadiness on feet  ONSET DATE: 09/13/2023  Rationale for  Evaluation and Treatment: Rehabilitation  SUBJECTIVE:   SUBJECTIVE STATEMENT: Did a lot of cleaning today. Dizziness hasn't been bad. Feels slightly foggy today. Thinks its the air control in her house. Gets worse when going into a department store. Bought a big purifier that controls the whole house last week and still has to set it up. Did some exercises, forgot to do the one with the X. Denies any spinning vertigo.   Pt accompanied by: self  PERTINENT HISTORY: PMH: Anxiety, R breast cancer with R breast lumpectomy (2018), hx of radiation, DVT, HLD, HTN  Per Dr. Karis: She has been symptomatic for several years. She describes her dizziness as an  intermittent lightheaded and off-balance sensation. The dizziness is often triggered when she gets up quickly from a sitting or supine position. She had an accidental fall in her kitchen 2 months ago.   PAIN:  Are you having pain? No  Vitals:   11/19/23 1545  BP: (!) 151/88  Pulse: 60     PRECAUTIONS: Fall  FALLS: Has patient fallen in last 6 months? Yes. Number of falls 1   PLOF: Independent  PATIENT GOALS: My ENT signed me up, doesn't want to feel dizzy and improve her balance   OBJECTIVE:  Note: Objective measures were completed at Evaluation unless otherwise noted. COGNITION: Overall cognitive status: Within functional limits for tasks assessed  GAIT: Gait pattern: WFL Distance walked: Clinic distances  Assistive device utilized: None Level of assistance: Complete Independence Comments: No unsteadiness noted during eval    VESTIBULAR ASSESSMENT:  GENERAL OBSERVATION: Ambulates in with no AD independently   SYMPTOM BEHAVIOR:  Subjective history: See above Non-Vestibular symptoms: nausea   Type of dizziness: Lightheadedness/Faint and feels foggy brained   Frequency: Few times a week  Duration: Normally they don't last all day, if she relaxes or goes to sleep normally they wear off   Aggravating factors: Induced by position change: supine to sit and Induced by motion: bending down to the ground, turning body quickly, and turning head quickly  Relieving factors: lying supine and staying still   Progression of symptoms: better, notes sometimes the dizziness will get better   OCULOMOTOR EXAM:  Ocular Alignment: normal  Ocular ROM: No Limitations  Spontaneous Nystagmus: absent  Gaze-Induced Nystagmus: absent  Smooth Pursuits: intact  Saccades: intact and pt reporting dizziness in vertical superior and inferior directions   VESTIBULAR - OCULAR REFLEX:   Slow VOR: Normal  VOR Cancellation: Normal, dizziness and reporting feeling it in her stomach   Head-Impulse  Test: HIT Right: positive HIT Left: negative Pt reporting dizziness afterwards   Dynamic Visual Acuity: Static: Line 8 Dynamic: Line 5 3 line difference, Pt reporting feeling dizziness during    MOTION SENSITIVITY:  Motion Sensitivity Quotient Intensity: 0 = none, 1 = Lightheaded, 2 = Mild, 3 = Moderate, 4 = Severe, 5 = Vomiting  Intensity  1. Sitting to supine   2. Supine to L side   3. Supine to R side   4. Supine to sitting   5. L Hallpike-Dix   6. Up from L    7. R Hallpike-Dix   8. Up from R    9. Sitting, head tipped to L knee 3  10. Head up from L knee 2  11. Sitting, head tipped to R knee 3  12. Head up from R knee 2  13. Sitting head turns x5 3  14.Sitting head nods x5 3 felt it in her nose   15.  In stance, 180 turn to L  3  16. In stance, 180 turn to R 3       M-CTSIB  Condition 1: Firm Surface, EO 30 Sec, Normal Sway  Condition 2: Firm Surface, EC 30 Sec, Normal Sway  Condition 3: Foam Surface, EO 30 Sec, Normal Sway  Condition 4: Foam Surface, EC 30 Sec, Mild Sway                                                                                                                                 TREATMENT DATE: 11/19/23  Therapeutic Activity:  Vitals:   11/19/23 1545  BP: (!) 151/88  Pulse: 60    POSITIONAL TESTING: Right Dix-Hallpike: no nystagmus Left Dix-Hallpike: no nystagmus Right Roll Test: no nystagmus Left Roll Test: no nystagmus  No dizziness   Educated on purpose of vestibular therapy and what PT will work on in regards to VOR and vestibular system, pt negative for BPPV as that is not the cause of pt's symptoms   NMR:   Gaze Adaptation - reviewed from HEP as pt has not yet performed  x1 Viewing Horizontal: Position: Standing, Time: 30 seconds, Reps: 2, and Comment: pt reporting slight dizziness from a 1-2/10 and slight flashes  x1 Viewing Vertical:  Position: Standing, Time: 30 seconds, Reps: 2, and Comment: feels some dizziness when  looking down and getting dizzy, feeling slightly more dizzy after stopping   Standing on air ex and holding ball for gaze stabilization: Feet hip width 10 reps CW and CCW direction, some dizziness in CW direction, performed an additional 10 reps with feet together, pt reporting no dizziness this time  On blue foam beam: Alternating forward stepping strategy on and off with head turns to R/L 2 x 10 reps each side, no dizziness just unsteadiness Feet together EO 10 reps head turns, 10 reps head nods Feet together EO 2 x 10 reps diagonals each direction, mild unsteadiness each direction, no dizziness    PATIENT EDUCATION: Education details: Continue HEP, see therapeutic activity  Person educated: Patient Education method: Explanation, Demonstration, Verbal cues, and Handouts Education comprehension: verbalized understanding and returned demonstration  HOME EXERCISE PROGRAM: Standing VOR x30 seconds  Access Code: [email or web address] URL: [email or web address] Date: 11/12/2023 Prepared by: Sheffield Senate  Exercises - Romberg Stance Eyes Closed on Foam Pad  - 1-2 x daily - 5 x weekly - 2 sets - 10 reps - Half Tandem Stance on Foam Pad  - 1-2 x daily - 5 x weekly - 2 sets - 10 reps  GOALS: Goals reviewed with patient? Yes  SHORT TERM GOALS: ALL STGS = LTGS   LONG TERM GOALS: Target date: 11/27/2023   Pt will perform DVA in 2 line difference or less in order to demo improved VOR.  Baseline: 3 line difference  Goal status: INITIAL  2.  Pt will report items on MSQ as 1/5 or less in order to demo  improved motion sensitivity.  Baseline: 2-3/5 Goal status: INITIAL  3.  FGA to be assessed with LTG written.  Baseline: 27/30  Goal status N/A - goal not needed   ASSESSMENT:  CLINICAL IMPRESSION: Assessed positional testing today as not assessed yet, pt negative for BPPV and did not have any dizziness or nystagmus. Pt's unsteadiness/dizziness appears to be more of a vestibular  hypofunction. Reviewed VOR x1 from HEP with pt having more symptoms in the vertical direction. Remainder of session focused on exercises to target vestibular system for balance with pt having no dizziness, just imbalance. Will continue per POC.   OBJECTIVE IMPAIRMENTS: decreased balance, decreased mobility, and dizziness.   ACTIVITY LIMITATIONS: bending, reach over head, and locomotion level  PARTICIPATION LIMITATIONS: community activity  PERSONAL FACTORS: Behavior pattern, Past/current experiences, Time since onset of injury/illness/exacerbation, and 3+ comorbidities: Anxiety, R breast cancer with R breast lumpectomy (2018), hx of radiation, DVT, HLD, HTN are also affecting patient's functional outcome.   REHAB POTENTIAL: Good  CLINICAL DECISION MAKING: Evolving/moderate complexity  EVALUATION COMPLEXITY: Moderate   PLAN:  PT FREQUENCY: 1x/week  PT DURATION: 4 weeks  PLANNED INTERVENTIONS: [id number]- PT Re-evaluation, 97110-Therapeutic exercises, 97530- Therapeutic activity, 97112- Neuromuscular re-education, 97535- Self Care, [id number]- Manual therapy, [phone]- Canalith repositioning, Patient/Family education, Balance training, and Vestibular training  PLAN FOR NEXT SESSION: work on progressing VOR, head motions, EC balance  Check goals, D/C?    Sheffield LOISE Senate, PT, DPT 11/19/2023, 4:14 PM

## 2023-11-20 ENCOUNTER — Encounter: Payer: Self-pay | Admitting: Adult Health

## 2023-11-20 ENCOUNTER — Inpatient Hospital Stay: Attending: Adult Health | Admitting: Adult Health

## 2023-11-20 VITALS — BP 148/80 | HR 59 | Temp 97.1°F | Resp 17 | Wt 172.4 lb

## 2023-11-20 DIAGNOSIS — Z1732 Human epidermal growth factor receptor 2 negative status: Secondary | ICD-10-CM | POA: Insufficient documentation

## 2023-11-20 DIAGNOSIS — Z86711 Personal history of pulmonary embolism: Secondary | ICD-10-CM | POA: Diagnosis not present

## 2023-11-20 DIAGNOSIS — M8580 Other specified disorders of bone density and structure, unspecified site: Secondary | ICD-10-CM | POA: Insufficient documentation

## 2023-11-20 DIAGNOSIS — Z79811 Long term (current) use of aromatase inhibitors: Secondary | ICD-10-CM | POA: Insufficient documentation

## 2023-11-20 DIAGNOSIS — Z800 Family history of malignant neoplasm of digestive organs: Secondary | ICD-10-CM | POA: Diagnosis not present

## 2023-11-20 DIAGNOSIS — Z86718 Personal history of other venous thrombosis and embolism: Secondary | ICD-10-CM | POA: Insufficient documentation

## 2023-11-20 DIAGNOSIS — Z1721 Progesterone receptor positive status: Secondary | ICD-10-CM | POA: Diagnosis not present

## 2023-11-20 DIAGNOSIS — C50511 Malignant neoplasm of lower-outer quadrant of right female breast: Secondary | ICD-10-CM | POA: Diagnosis not present

## 2023-11-20 DIAGNOSIS — Z87891 Personal history of nicotine dependence: Secondary | ICD-10-CM | POA: Insufficient documentation

## 2023-11-20 DIAGNOSIS — R5383 Other fatigue: Secondary | ICD-10-CM | POA: Insufficient documentation

## 2023-11-20 DIAGNOSIS — Z170 Estrogen receptor positive status [ER+]: Secondary | ICD-10-CM | POA: Insufficient documentation

## 2023-11-20 DIAGNOSIS — M8589 Other specified disorders of bone density and structure, multiple sites: Secondary | ICD-10-CM

## 2023-11-20 NOTE — Progress Notes (Signed)
 [facility] Cancer Follow up:    Laura Almarie SAUNDERS, MD [address] [place] [address]   DIAGNOSIS: Cancer Staging  Malignant neoplasm of lower-outer quadrant of right breast of female, estrogen receptor positive (HCC) Staging form: Breast, AJCC 8th Edition - Clinical stage from 11/20/2016: Stage Unknown (cTX, cN0, cM0, G1, ER+, PR+, HER2-) - Unsigned Histologic grading system: 3 grade system Laterality: Right Staged by: Pathologist and managing physician Stage used in treatment planning: Yes National guidelines used in treatment planning: Yes Type of national guideline used in treatment planning: NCCN    SUMMARY OF ONCOLOGIC HISTORY: 68 y.o. [place] woman status post right breast lower outer quadrant biopsy 11/11/2016 for a clinical TX N), stage I invasive ductal carcinoma, grade 2, E-cadherin positive, estrogen receptor 95% positive, progesterone receptor 5% positive, with an MIB-1 of 10%, and no HER-2 amplification   (1) Status post right lumpectomy and right axillary sentinel lymph node sampling 12/16/2016 for a pT1c pN0, stage IA invasive ductal carcinoma, grade 1, with negative margins.  Total of 3 sentinel lymph nodes removed   (2) The Oncotype DX score was 18, predicting a risk of outside the breast recurrence over the next 10 years of 11% if the patient's only systemic therapy is tamoxifen for 5 years.  It also predicts no benefit from chemotherapy.   (3) Adjuvant radiation 02/05/2017-03/05/2017 Site/dose:           1. Right breast, 2.67 Gy in 15 fractions for a total dose of 40.05 Gy                               2. Right breast boost, 2 Gy in 5 fractions for a total dose of 10 Gy    (4) anastrozole  started February 2019             (a) not a good tamoxifen candidate given problem #5             (b) bone density at breast center 10/04/2015 with T-score of -0.3 (normal)             (c) bone density 12/28/2018 showed a T score of -0.4 (normal)              (d) bone density 04/26/2021   (5) history of DVT/PE March 2010, with negative extensive hypercoagulable workup, IVC filter in place    CURRENT THERAPY: observation  INTERVAL HISTORY:  Discussed the use of AI scribe software for clinical note transcription with the patient, who gave verbal consent to proceed.  History of Present Illness [name] is a 68 year old female with stage one invasive ductal carcinoma who presents for follow-up regarding her breast cancer treatment.  She was diagnosed with stage one invasive ductal carcinoma, ER positive, PR positive, HER2 negative, in 2018. She underwent a right lumpectomy followed by adjuvant radiation and has been on antiestrogen therapy with anastrozole  since 2019.  She experienced anxiety and side effects from anastrozole , including fatigue, elevated blood pressure, and palpitations. Her cardiologist discontinued anastrozole  for 30 days, leading to improvements in these symptoms. Fatigue previously occurred in the afternoon and after morning bowel movements, which have resolved since discontinuation.  Guardant Reveal blood testing was negative for circulating tumor DNA. She is scheduled for a mammogram next week.     Patient Active Problem List   Diagnosis Date Noted   Right thyroid  nodule 10/15/2022   Pre-diabetes 07/04/2021  Wellness examination 03/06/2021   Graves disease 03/02/2021   Dizziness 03/02/2021   History of pulmonary embolism 01/26/2021   Uterine leiomyoma 01/26/2021   Vitamin D  deficiency 01/26/2021   Dyslipidemia 11/08/2018   S/P insertion of inferior vena caval filter 08/04/2018   Systolic murmur 07/10/2018   History of DVT (deep vein thrombosis) 02/16/2018   History of breast cancer 02/16/2018   Malignant neoplasm of lower-outer quadrant of right breast of female, estrogen receptor positive (HCC) 11/19/2016   Alkaline phosphatase raised 10/29/2015   Anxiety 10/04/2014   Hyperthyroidism 09/03/2014    Vitamin B 12 deficiency 04/19/2008   ANEMIA, IRON DEFICIENCY 04/19/2008   Hypertensive disorder 03/30/2008   GERD 03/30/2008    is allergic to amoxicillin-pot clavulanate and niacin-lovastatin er.  MEDICAL HISTORY: Past Medical History:  Diagnosis Date   ALLERGIC RHINITIS 03/30/2008   Qualifier: Diagnosis of  By: [name]    Anxiety    Arthritis    neck   Breast cancer (HCC) 2018   Right Breast Cancer   Deep venous thrombosis (HCC) 01/26/2021   Dysarthria 09/22/2012   GERD (gastroesophageal reflux disease)    Graves' disease 09/03/2014   Headache(784.0) 09/22/2012   History of hiatal hernia    History of radiation therapy 02/05/2017   02/05/17-03/05/17,  right breast 40.05 Gy in 15 fractions, right breast boost 10 Gy in 5 fractions   Hyperlipidemia    Hypertension    Hyperthyroidism    Iron deficiency anemia    LEG PAIN, RIGHT 04/22/2008   Qualifier: Diagnosis of  By: [name]    Low back pain 01/26/2021   Malignant tumor of breast (HCC) 11/13/2016   Palpitation 09/24/2013   Personal history of radiation therapy 2018   Right Breast Cancer   Pulmonary emboli (HCC) 11/20/2016   Uterine fibroid    Vitamin D  deficiency    Graves disease    SURGICAL HISTORY: Past Surgical History:  Procedure Laterality Date   BREAST LUMPECTOMY Right 12/16/2016   BREAST LUMPECTOMY WITH RADIOACTIVE SEED AND SENTINEL LYMPH NODE BIOPSY Right 12/16/2016   Procedure: RIGHT BREAST LUMPECTOMY WITH RADIOACTIVE SEED AND SENTINEL LYMPH NODE BIOPSY;  Surgeon: Laura Cough, MD;  Location: [facility] OR;  Service: General;  Laterality: Right;   CHOLECYSTECTOMY     COLONOSCOPY     DILATION AND CURETTAGE OF UTERUS     01/2008   ESOPHAGOGASTRODUODENOSCOPY     ivc filter      SOCIAL HISTORY: Social History   Socioeconomic History   Marital status: Single    Spouse name: Not on file   Number of children: 0   Years of education: college   Highest education level: Not on file   Occupational History   Occupation: TAX SPECIALIST    Employer: [employer]  Tobacco Use   Smoking status: Former    Current packs/day: 0.00    Average packs/day: 0.4 packs/day for 10.0 years (4.0 ttl pk-yrs)    Types: Cigarettes    Start date: 05/31/1992    Quit date: 06/01/2002    Years since quitting: 21.4   Smokeless tobacco: Never  Vaping Use   Vaping status: Never Used  Substance and Sexual Activity   Alcohol use: Yes    Comment: occasional 1 drink per month   Drug use: No   Sexual activity: Not on file  Other Topics Concern   Not on file  Social History Narrative   Lives alone.    Social Drivers of Health  Financial Resource Strain: Low Risk  (11/03/2023)   Overall Financial Resource Strain (CARDIA)    Difficulty of Paying Living Expenses: Not hard at all  Food Insecurity: No Food Insecurity (11/03/2023)   Hunger Vital Sign    Worried About Running Out of Food in the Last Year: Never true    Ran Out of Food in the Last Year: Never true  Transportation Needs: No Transportation Needs (11/03/2023)   PRAPARE - Administrator, Civil Service (Medical): No    Lack of Transportation (Non-Medical): No  Physical Activity: Inactive (11/03/2023)   Exercise Vital Sign    Days of Exercise per Week: 0 days    Minutes of Exercise per Session: 0 min  Stress: No Stress Concern Present (11/03/2023)   [employer] of Occupational Health - Occupational Stress Questionnaire    Feeling of Stress: Only a little  Social Connections: Moderately Integrated (11/03/2023)   Social Connection and Isolation Panel    Frequency of Communication with Friends and Family: More than three times a week    Frequency of Social Gatherings with Friends and Family: Patient unable to answer    Attends Religious Services: More than 4 times per year    Active Member of [employer] or Organizations: Yes    Attends Engineer, structural: More than 4 times per year    Marital Status: Divorced   Intimate Partner Violence: Not At Risk (11/03/2023)   Humiliation, Afraid, Rape, and Kick questionnaire    Fear of Current or Ex-Partner: No    Emotionally Abused: No    Physically Abused: No    Sexually Abused: No    FAMILY HISTORY: Family History  Problem Relation Age of Onset   Hypertension Mother    Drug abuse Brother    Colon cancer Maternal Grandfather     Review of Systems  Constitutional:  Negative for appetite change, chills, fatigue, fever and unexpected weight change.  HENT:   Negative for hearing loss, lump/mass and trouble swallowing.   Eyes:  Negative for eye problems and icterus.  Respiratory:  Negative for chest tightness, Allison and shortness of breath.   Cardiovascular:  Negative for chest pain, leg swelling and palpitations.  Gastrointestinal:  Negative for abdominal distention, abdominal pain, constipation, diarrhea, nausea and vomiting.  Endocrine: Negative for hot flashes.  Genitourinary:  Negative for difficulty urinating.   Musculoskeletal:  Negative for arthralgias.  Skin:  Negative for itching and rash.  Neurological:  Negative for dizziness, extremity weakness, headaches and numbness.  Hematological:  Negative for adenopathy. Does not bruise/bleed easily.  Psychiatric/Behavioral:  Negative for depression. The patient is not nervous/anxious.       PHYSICAL EXAMINATION    Vitals:   11/20/23 1320  BP: (!) 148/80  Pulse: (!) 59  Resp: 17  Temp: (!) 97.1 F (36.2 C)  SpO2: 100%    Physical Exam Constitutional:      General: She is not in acute distress.    Appearance: Normal appearance. She is not toxic-appearing.  HENT:     Head: Normocephalic and atraumatic.     Mouth/Throat:     Mouth: Mucous membranes are moist.     Pharynx: Oropharynx is clear. No oropharyngeal exudate or posterior oropharyngeal erythema.  Eyes:     General: No scleral icterus. Cardiovascular:     Rate and Rhythm: Normal rate and regular rhythm.     Pulses: Normal  pulses.     Heart sounds: Normal heart sounds.  Pulmonary:  Effort: Pulmonary effort is normal.     Breath sounds: Normal breath sounds.  Chest:     Comments: Right breast s/p lumpectomy and radiation, no sign of local recurrence, left breast benign Abdominal:     General: Abdomen is flat. Bowel sounds are normal. There is no distension.     Palpations: Abdomen is soft.     Tenderness: There is no abdominal tenderness.  Musculoskeletal:        General: No swelling.     Cervical back: Neck supple.  Lymphadenopathy:     Cervical: No cervical adenopathy.     Upper Body:     Right upper body: No supraclavicular or axillary adenopathy.     Left upper body: No supraclavicular or axillary adenopathy.  Skin:    General: Skin is warm and dry.     Findings: No rash.  Neurological:     General: No focal deficit present.     Mental Status: She is alert.  Psychiatric:        Mood and Affect: Mood normal.        Behavior: Behavior normal.     LABORATORY DATA:  CBC    Component Value Date/Time   WBC 4.7 10/08/2023 0757   RBC 4.05 10/08/2023 0757   HGB 12.9 10/08/2023 0757   HGB 12.9 12/05/2022 1132   HGB 11.8 01/26/2021 1025   HGB 12.5 11/20/2016 0822   HCT 38.7 10/08/2023 0757   HCT 36.6 01/26/2021 1025   HCT 38.2 11/20/2016 0822   PLT 142 (L) 10/08/2023 0757   PLT 150 12/05/2022 1132   PLT 178 01/26/2021 1025   MCV 95.6 10/08/2023 0757   MCV 95 01/26/2021 1025   MCV 97.9 11/20/2016 0822   MCH 31.9 10/08/2023 0757   MCHC 33.3 10/08/2023 0757   RDW 12.6 10/08/2023 0757   RDW 12.9 01/26/2021 1025   RDW 13.1 11/20/2016 0822   LYMPHSABS 1.0 08/14/2023 1158   LYMPHSABS 1.5 01/26/2021 1025   LYMPHSABS 2.4 11/20/2016 0822   MONOABS 0.3 08/14/2023 1158   MONOABS 0.3 11/20/2016 0822   EOSABS 0.0 08/14/2023 1158   EOSABS 0.2 01/26/2021 1025   BASOSABS 0.0 08/14/2023 1158   BASOSABS 0.0 01/26/2021 1025   BASOSABS 0.0 11/20/2016 0822    CMP     Component Value Date/Time    NA 136 09/28/2023 1414   NA 142 01/26/2021 1025   NA 141 11/20/2016 0822   K 3.9 09/28/2023 1414   K 3.6 11/20/2016 0822   CL 101 09/28/2023 1414   CL 105 06/24/2012 1548   CO2 20 (L) 09/28/2023 1414   CO2 24 11/20/2016 0822   GLUCOSE 147 (H) 09/28/2023 1414   GLUCOSE 102 11/20/2016 0822   GLUCOSE 91 06/24/2012 1548   BUN 10 09/28/2023 1414   BUN 12 01/26/2021 1025   BUN 19.2 11/20/2016 0822   CREATININE 0.89 09/28/2023 1414   CREATININE 0.94 12/05/2022 1132   CREATININE 0.9 11/20/2016 0822   CALCIUM  10.3 09/28/2023 1414   CALCIUM  9.5 11/20/2016 0822   PROT 7.8 09/28/2023 1536   PROT 7.8 01/26/2021 1025   PROT 8.2 11/20/2016 0822   ALBUMIN 4.3 09/28/2023 1536   ALBUMIN 4.2 12/16/2022 1146   ALBUMIN 3.8 11/20/2016 0822   AST 22 09/28/2023 1536   AST 18 12/05/2022 1132   AST 15 11/20/2016 0822   ALT 19 09/28/2023 1536   ALT 18 12/05/2022 1132   ALT 15 11/20/2016 0822   ALKPHOS 181 (H) 09/28/2023 1536  ALKPHOS 122 11/20/2016 0822   BILITOT 0.6 09/28/2023 1536   BILITOT 0.3 12/16/2022 1146   BILITOT 0.7 12/05/2022 1132   BILITOT 0.52 11/20/2016 0822   GFRNONAA >60 09/28/2023 1414   GFRNONAA >60 12/05/2022 1132   GFRAA >60 03/18/2019 1631     ASSESSMENT and THERAPY PLAN:   Assessment and Plan Assessment & Plan Stage I ER+/PR+ HER2- right breast invasive ductal carcinoma Status post lumpectomy, adjuvant radiation, and anastrozole . Asymptomatic, no recurrence. Guardant Reveal negative. Awaiting BCI results for therapy decision. - Await BCI results. - Discuss alternative medications if BCI indicates. - Continue surveillance and follow-up.  Osteopenia Requires updated bone density testing. - Order bone density testing at [facility] or [place].   All questions were answered. The patient knows to call the clinic with any problems, questions or concerns. We can certainly see the patient much sooner if necessary.  Total encounter time:30 minutes*in face-to-face  visit time, chart review, lab review, care coordination, order entry, and documentation of the encounter time.    Morna Kendall, NP 11/20/23 1:55 PM Medical Oncology and Hematology [facility] [address] [place], [place] [address] Tel. [phone]    Fax. [phone]  *Total Encounter Time as defined by the Centers for Medicare and Medicaid Services includes, in addition to the face-to-face time of a patient visit (documented in the note above) non-face-to-face time: obtaining and reviewing outside history, ordering and reviewing medications, tests or procedures, care coordination (communications with other health care professionals or caregivers) and documentation in the medical record.

## 2023-11-23 ENCOUNTER — Other Ambulatory Visit: Payer: Self-pay | Admitting: Physician Assistant

## 2023-11-23 DIAGNOSIS — R079 Chest pain, unspecified: Secondary | ICD-10-CM

## 2023-11-23 DIAGNOSIS — I10 Essential (primary) hypertension: Secondary | ICD-10-CM

## 2023-11-23 DIAGNOSIS — E785 Hyperlipidemia, unspecified: Secondary | ICD-10-CM

## 2023-11-23 DIAGNOSIS — R002 Palpitations: Secondary | ICD-10-CM

## 2023-11-23 DIAGNOSIS — Z79899 Other long term (current) drug therapy: Secondary | ICD-10-CM

## 2023-11-23 DIAGNOSIS — R55 Syncope and collapse: Secondary | ICD-10-CM

## 2023-11-25 DIAGNOSIS — E785 Hyperlipidemia, unspecified: Secondary | ICD-10-CM | POA: Diagnosis not present

## 2023-11-26 ENCOUNTER — Ambulatory Visit
Admission: RE | Admit: 2023-11-26 | Discharge: 2023-11-26 | Disposition: A | Discharge status: Home / Self Care | Source: Ambulatory Visit | Attending: Hematology and Oncology | Admitting: Hematology and Oncology

## 2023-11-26 ENCOUNTER — Ambulatory Visit: Admitting: Physical Therapy

## 2023-11-26 ENCOUNTER — Encounter: Payer: Self-pay | Admitting: Physical Therapy

## 2023-11-26 DIAGNOSIS — R2681 Unsteadiness on feet: Secondary | ICD-10-CM

## 2023-11-26 DIAGNOSIS — R42 Dizziness and giddiness: Secondary | ICD-10-CM

## 2023-11-26 DIAGNOSIS — Z1231 Encounter for screening mammogram for malignant neoplasm of breast: Secondary | ICD-10-CM

## 2023-11-26 LAB — MICROALBUMIN / CREATININE URINE RATIO
Creatinine, Urine: 320.1 mg/dL
Microalb/Creat Ratio: 4 mg/g{creat} (ref 0–29)
Microalbumin, Urine: 12 ug/mL

## 2023-11-26 LAB — COMPREHENSIVE METABOLIC PANEL WITH GFR
ALT: 15 IU/L (ref 0–32)
AST: 18 IU/L (ref 0–40)
Albumin: 4.4 g/dL (ref 3.9–4.9)
Alkaline Phosphatase: 182 IU/L — ABNORMAL HIGH (ref 49–135)
BUN/Creatinine Ratio: 13 (ref 12–28)
BUN: 11 mg/dL (ref 8–27)
Bilirubin Total: 0.5 mg/dL (ref 0.0–1.2)
CO2: 23 mmol/L (ref 20–29)
Calcium: 9.8 mg/dL (ref 8.7–10.3)
Chloride: 105 mmol/L (ref 96–106)
Creatinine, Ser: 0.88 mg/dL (ref 0.57–1.00)
Globulin, Total: 3.3 g/dL (ref 1.5–4.5)
Glucose: 100 mg/dL — ABNORMAL HIGH (ref 70–99)
Potassium: 4.4 mmol/L (ref 3.5–5.2)
Sodium: 141 mmol/L (ref 134–144)
Total Protein: 7.7 g/dL (ref 6.0–8.5)
eGFR: 73 mL/min/1.73 (ref >59)

## 2023-11-26 LAB — LIPID PANEL
Chol/HDL Ratio: 4.7 ratio — ABNORMAL HIGH (ref 0.0–4.4)
Cholesterol, Total: 254 mg/dL — ABNORMAL HIGH (ref 100–199)
HDL: 54 mg/dL (ref >39)
LDL Chol Calc (NIH): 186 mg/dL — ABNORMAL HIGH (ref 0–99)
Triglycerides: 80 mg/dL (ref 0–149)
VLDL Cholesterol Cal: 14 mg/dL (ref 5–40)

## 2023-11-26 NOTE — Therapy (Addendum)
 OUTPATIENT PHYSICAL THERAPY VESTIBULAR TREATMENT/DISCHARGE SUMMARY     Patient Name: [name] MRN: [id number] DOB:07-16-1955, 68 y.o., female Today's Date: 11/26/2023  END OF SESSION:  PT End of Session - 11/26/23 1443     Visit Number 4    Number of Visits 5    Date for Recertification  11/29/23    Authorization Type [employer]    PT Start Time 1442    PT Stop Time 1512   full time not used due to D/C   PT Time Calculation (min) 30 min    Activity Tolerance Patient tolerated treatment well    Behavior During Therapy [facility] for tasks assessed/performed          Past Medical History:  Diagnosis Date   ALLERGIC RHINITIS 03/30/2008   Qualifier: Diagnosis of  By: Norleen MD, [place] ORN    Anxiety    Arthritis    neck   Breast cancer (HCC) 2018   Right Breast Cancer   Deep venous thrombosis (HCC) 01/26/2021   Dysarthria 09/22/2012   GERD (gastroesophageal reflux disease)    Graves' disease 09/03/2014   Headache(784.0) 09/22/2012   History of hiatal hernia    History of radiation therapy 02/05/2017   02/05/17-03/05/17,  right breast 40.05 Gy in 15 fractions, right breast boost 10 Gy in 5 fractions   Hyperlipidemia    Hypertension    Hyperthyroidism    Iron deficiency anemia    LEG PAIN, RIGHT 04/22/2008   Qualifier: Diagnosis of  By: Norleen MD, [place] ORN    Low back pain 01/26/2021   Malignant tumor of breast (HCC) 11/13/2016   Palpitation 09/24/2013   Personal history of radiation therapy 2018   Right Breast Cancer   Pulmonary emboli (HCC) 11/20/2016   Uterine fibroid    Vitamin D  deficiency    Graves disease   Past Surgical History:  Procedure Laterality Date   BREAST LUMPECTOMY Right 12/16/2016   BREAST LUMPECTOMY WITH RADIOACTIVE SEED AND SENTINEL LYMPH NODE BIOPSY Right 12/16/2016   Procedure: RIGHT BREAST LUMPECTOMY WITH RADIOACTIVE SEED AND SENTINEL LYMPH NODE BIOPSY;  Surgeon: Ebbie Cough, MD;  Location: [facility] OR;  Service: General;   Laterality: Right;   CHOLECYSTECTOMY     COLONOSCOPY     DILATION AND CURETTAGE OF UTERUS     01/2008   ESOPHAGOGASTRODUODENOSCOPY     ivc filter     Patient Active Problem List   Diagnosis Date Noted   Right thyroid  nodule 10/15/2022   Pre-diabetes 07/04/2021   Wellness examination 03/06/2021   Graves disease 03/02/2021   Dizziness 03/02/2021   History of pulmonary embolism 01/26/2021   Uterine leiomyoma 01/26/2021   Vitamin D  deficiency 01/26/2021   Dyslipidemia 11/08/2018   S/P insertion of inferior vena caval filter 08/04/2018   Systolic murmur 07/10/2018   History of DVT (deep vein thrombosis) 02/16/2018   History of breast cancer 02/16/2018   Malignant neoplasm of lower-outer quadrant of right breast of female, estrogen receptor positive (HCC) 11/19/2016   Alkaline phosphatase raised 10/29/2015   Anxiety 10/04/2014   Hyperthyroidism 09/03/2014   Vitamin B 12 deficiency 04/19/2008   ANEMIA, IRON DEFICIENCY 04/19/2008   Hypertensive disorder 03/30/2008   GERD 03/30/2008    PCP: [name], MD  REFERRING PROVIDER: Karis Clunes, MD  REFERRING DIAG: R42 (ICD-10-CM) - Dizziness  THERAPY DIAG:  Dizziness and giddiness  Unsteadiness on feet  ONSET DATE: 09/13/2023  Rationale for Evaluation and Treatment: Rehabilitation  SUBJECTIVE:   SUBJECTIVE  STATEMENT: Feels better than when she started. Doesn't notice the dizziness as much. Feels like the exercises are working. Had a mammogram earlier today.   Pt accompanied by: self  PERTINENT HISTORY: PMH: Anxiety, R breast cancer with R breast lumpectomy (2018), hx of radiation, DVT, HLD, HTN  Per Dr. Karis: She has been symptomatic for several years. She describes her dizziness as an intermittent lightheaded and off-balance sensation. The dizziness is often triggered when she gets up quickly from a sitting or supine position. She had an accidental fall in her kitchen 2 months ago.   PAIN:  Are you having pain? Has  some pain in medial part of L knee   There were no vitals filed for this visit.  PRECAUTIONS: Fall  FALLS: Has patient fallen in last 6 months? Yes. Number of falls 1   PLOF: Independent  PATIENT GOALS: My ENT signed me up, doesn't want to feel dizzy and improve her balance   OBJECTIVE:  Note: Objective measures were completed at Evaluation unless otherwise noted. COGNITION: Overall cognitive status: Within functional limits for tasks assessed  GAIT: Gait pattern: WFL Distance walked: Clinic distances  Assistive device utilized: None Level of assistance: Complete Independence Comments: No unsteadiness noted during eval    VESTIBULAR ASSESSMENT:  GENERAL OBSERVATION: Ambulates in with no AD independently   SYMPTOM BEHAVIOR:  Subjective history: See above Non-Vestibular symptoms: nausea   Type of dizziness: Lightheadedness/Faint and feels foggy brained   Frequency: Few times a week  Duration: Normally they don't last all day, if she relaxes or goes to sleep normally they wear off   Aggravating factors: Induced by position change: supine to sit and Induced by motion: bending down to the ground, turning body quickly, and turning head quickly  Relieving factors: lying supine and staying still   Progression of symptoms: better, notes sometimes the dizziness will get better   OCULOMOTOR EXAM:  Ocular Alignment: normal  Ocular ROM: No Limitations  Spontaneous Nystagmus: absent  Gaze-Induced Nystagmus: absent  Smooth Pursuits: intact  Saccades: intact and pt reporting dizziness in vertical superior and inferior directions   VESTIBULAR - OCULAR REFLEX:   Slow VOR: Normal  VOR Cancellation: Normal, dizziness and reporting feeling it in her stomach   Head-Impulse Test: HIT Right: positive HIT Left: negative Pt reporting dizziness afterwards   Dynamic Visual Acuity: Static: Line 8 Dynamic: Line 5 3 line difference, Pt reporting feeling dizziness during    MOTION  SENSITIVITY:  Motion Sensitivity Quotient Intensity: 0 = none, 1 = Lightheaded, 2 = Mild, 3 = Moderate, 4 = Severe, 5 = Vomiting  Intensity  1. Sitting to supine   2. Supine to L side   3. Supine to R side   4. Supine to sitting   5. L Hallpike-Dix   6. Up from L    7. R Hallpike-Dix   8. Up from R    9. Sitting, head tipped to L knee 3  10. Head up from L knee 2  11. Sitting, head tipped to R knee 3  12. Head up from R knee 2  13. Sitting head turns x5 3  14.Sitting head nods x5 3 felt it in her nose   15. In stance, 180 turn to L  3  16. In stance, 180 turn to R 3       M-CTSIB  Condition 1: Firm Surface, EO 30 Sec, Normal Sway  Condition 2: Firm Surface, EC 30 Sec, Normal Sway  Condition 3:  Foam Surface, EO 30 Sec, Normal Sway  Condition 4: Foam Surface, EC 30 Sec, Mild Sway                                                                                                                                 TREATMENT DATE: 11/26/23  Therapeutic Activity:  MOTION SENSITIVITY:            Motion Sensitivity Quotient Intensity: 0 = none, 1 = Lightheaded, 2 = Mild, 3 = Moderate, 4 = Severe, 5 = Vomiting   Intensity 10/30/23 11/26/23  1. Sitting to supine     2. Supine to L side     3. Supine to R side     4. Supine to sitting     5. L Hallpike-Dix     6. Up from L      7. R Hallpike-Dix     8. Up from R      9. Sitting, head tipped to L knee 3 0  10. Head up from L knee 2 0  11. Sitting, head tipped to R knee 3 0  12. Head up from R knee 2 0  13. Sitting head turns x5 3 0  14.Sitting head nods x5 3 felt it in her nose  1  15. In stance, 180 turn to L  3 0  16. In stance, 180 turn to R 3 1              Dynamic Visual Acuity:  Static: Line 10 Dynamic: Line 6 4 line difference, Pt reporting no dizziness   Reviewed HEP:   NMR:   Gaze Adaptation x1 Viewing Horizontal: Position: Standing with busy background, Time: 30 seconds, Reps: 2, and Comment: no  dizziness  x1 Viewing Vertical:  Position: Standing with busy background, Time: 30 seconds, Reps: 2, and Comment: pt reports barely any dizziness   Discussed progressing VOR x1 to a busy background for home   Access Code: [email or web address] URL: [email or web address] Date: 11/26/2023 Prepared by: Sheffield Senate  Exercises - Romberg Stance Eyes Closed on Foam Pad  - 1-2 x daily - 5 x weekly - 2 sets - 10 reps with head turns and nods  - Half Tandem Stance on Foam Pad  - 1-2 x daily - 5 x weekly - 2 sets - 10 reps - discussed can progress tandem stance to make it more challenging   PHYSICAL THERAPY DISCHARGE SUMMARY  Visits from Start of Care: 4  Current functional level related to goals / functional outcomes: See LTGs   Remaining deficits: Slightly impaired VOR   Education / Equipment: HEP, education regarding vestibular system    Patient agrees to discharge. Patient goals were partially met. Patient is being discharged due to meeting the stated rehab goals. And pt feeling better with exercises    PATIENT EDUCATION: Education details:  D/C from PT, results of goals, purpose of exercises in regards to balance/vestibular  system and to continue with HEP Person educated: Patient Education method: Explanation, Demonstration, Verbal cues, and Handouts Education comprehension: verbalized understanding and returned demonstration  HOME EXERCISE PROGRAM: Standing VOR x30 seconds  Access Code: [email or web address] URL: [email or web address] Date: 11/12/2023 Prepared by: Sheffield Senate  Exercises - Romberg Stance Eyes Closed on Foam Pad  - 1-2 x daily - 5 x weekly - 2 sets - 10 reps - Half Tandem Stance on Foam Pad  - 1-2 x daily - 5 x weekly - 2 sets - 10 reps  GOALS: Goals reviewed with patient? Yes  SHORT TERM GOALS: ALL STGS = LTGS   LONG TERM GOALS: Target date: 11/27/2023   Pt will perform DVA in 2 line difference or less in order to demo improved VOR.   Baseline: 3 line difference   4 line difference, no dizziness  Goal status: NOT MET  2.  Pt will report items on MSQ as 1/5 or less in order to demo improved motion sensitivity.  Baseline: 2-3/5  0-1/5 on 10/15 Goal status: MET  3.  FGA to be assessed with LTG written.  Baseline: 27/30  Goal status N/A - goal not needed   ASSESSMENT:  CLINICAL IMPRESSION: Today's skilled session focused on assessing LTGs for anticipated D/C. Pt improved MSQ to a 0-1/5 for all tasks, indicating improved motion sensitivity (see chart above). Pt having a 4 line difference on DVA and did not meet this goal, but did not have any dizziness when performing this time. Pt reporting performing her HEP has helped with her dizziness and has not noted any dizziness episodes. Due to progress, will be discharged from PT at this time with pt in agreement with plan.   OBJECTIVE IMPAIRMENTS: decreased balance, decreased mobility, and dizziness.   ACTIVITY LIMITATIONS: bending, reach over head, and locomotion level  PARTICIPATION LIMITATIONS: community activity  PERSONAL FACTORS: Behavior pattern, Past/current experiences, Time since onset of injury/illness/exacerbation, and 3+ comorbidities: Anxiety, R breast cancer with R breast lumpectomy (2018), hx of radiation, DVT, HLD, HTN are also affecting patient's functional outcome.   REHAB POTENTIAL: Good  CLINICAL DECISION MAKING: Evolving/moderate complexity  EVALUATION COMPLEXITY: Moderate   PLAN:  PT FREQUENCY: 1x/week  PT DURATION: 4 weeks  PLANNED INTERVENTIONS: 97164- PT Re-evaluation, 97110-Therapeutic exercises, 97530- Therapeutic activity, [id number]- Neuromuscular re-education, 97535- Self Care, [id number]- Manual therapy, [phone]- Canalith repositioning, Patient/Family education, Balance training, and Vestibular training  PLAN FOR NEXT SESSION: D/C   Sheffield LOISE Senate, PT, DPT 11/26/2023, 3:16 PM

## 2023-11-28 ENCOUNTER — Encounter (HOSPITAL_COMMUNITY): Payer: Self-pay

## 2023-11-30 ENCOUNTER — Ambulatory Visit: Payer: Self-pay | Admitting: Hematology and Oncology

## 2023-12-01 DIAGNOSIS — F331 Major depressive disorder, recurrent, moderate: Secondary | ICD-10-CM | POA: Diagnosis not present

## 2023-12-01 DIAGNOSIS — G4700 Insomnia, unspecified: Secondary | ICD-10-CM | POA: Diagnosis not present

## 2023-12-01 DIAGNOSIS — I10 Essential (primary) hypertension: Secondary | ICD-10-CM | POA: Diagnosis not present

## 2023-12-01 DIAGNOSIS — F411 Generalized anxiety disorder: Secondary | ICD-10-CM | POA: Diagnosis not present

## 2023-12-01 NOTE — Telephone Encounter (Signed)
 Attempted to reach patient to discuss Breast Cancer Index results.  Unable to leave voicemail as voicemail was full.  Sent mychart message advising patient.

## 2023-12-08 ENCOUNTER — Ambulatory Visit: Payer: Federal, State, Local not specified - PPO | Admitting: Hematology and Oncology

## 2023-12-08 ENCOUNTER — Ambulatory Visit (HOSPITAL_BASED_OUTPATIENT_CLINIC_OR_DEPARTMENT_OTHER): Payer: Self-pay | Admitting: Family

## 2023-12-08 DIAGNOSIS — I10 Essential (primary) hypertension: Secondary | ICD-10-CM

## 2023-12-08 DIAGNOSIS — Z5181 Encounter for therapeutic drug level monitoring: Secondary | ICD-10-CM

## 2023-12-08 DIAGNOSIS — E785 Hyperlipidemia, unspecified: Secondary | ICD-10-CM

## 2023-12-16 ENCOUNTER — Telehealth: Payer: Self-pay | Admitting: Cardiology

## 2023-12-16 NOTE — Telephone Encounter (Signed)
 Pt c/o medication issue:  1. Name of Medication:   atorvastatin  (LIPITOR) 20 MG tablet    2. How are you currently taking this medication (dosage and times per day)?   3. Are you having a reaction (difficulty breathing--STAT)?   4. What is your medication issue?   Patient says Dr. Lavona stopped Atorvastatin  20 MG because she thought it was making her sick. She hasn't taken it in 1 month. Based on recent labs, Dr. Raford would like to start patient on 40 MG. She would like to know if she should should back on it. If so, should she start taking 20 or 40 MG? Please advise.

## 2023-12-16 NOTE — Telephone Encounter (Signed)
 Per last note, Atorvastatin  temporarily held for fatigue but resumed as fatigue was not impacted by Atorvastatin .   Based on her labs, recommend Atorvastatin  40mg  daily.   Barrie Wale S Aundra Espin, NP

## 2023-12-16 NOTE — Telephone Encounter (Signed)
 Tried reaching out to the pt to endorse recommendations per Reche Finder, NP and Dr. Raford, and pt did not answer with voicemail full at this time.

## 2023-12-17 NOTE — Telephone Encounter (Signed)
 Will send the message from Caitlin to the patient via the active mychart account.

## 2023-12-19 MED ORDER — ATORVASTATIN CALCIUM 40 MG PO TABS: 40.0000 mg | ORAL_TABLET | Freq: Every day | ORAL | 3 refills | Status: AC

## 2024-01-20 ENCOUNTER — Inpatient Hospital Stay: Attending: Adult Health | Admitting: Hematology and Oncology

## 2024-01-20 VITALS — BP 136/75 | HR 63 | Temp 97.1°F | Resp 17 | Wt 177.2 lb

## 2024-01-20 DIAGNOSIS — C50511 Malignant neoplasm of lower-outer quadrant of right female breast: Secondary | ICD-10-CM | POA: Diagnosis not present

## 2024-01-20 DIAGNOSIS — M8589 Other specified disorders of bone density and structure, multiple sites: Secondary | ICD-10-CM | POA: Diagnosis not present

## 2024-01-20 DIAGNOSIS — Z1732 Human epidermal growth factor receptor 2 negative status: Secondary | ICD-10-CM | POA: Diagnosis not present

## 2024-01-20 DIAGNOSIS — Z170 Estrogen receptor positive status [ER+]: Secondary | ICD-10-CM | POA: Diagnosis not present

## 2024-01-20 DIAGNOSIS — Z79811 Long term (current) use of aromatase inhibitors: Secondary | ICD-10-CM | POA: Insufficient documentation

## 2024-01-20 DIAGNOSIS — Z86711 Personal history of pulmonary embolism: Secondary | ICD-10-CM | POA: Diagnosis not present

## 2024-01-20 DIAGNOSIS — Z1721 Progesterone receptor positive status: Secondary | ICD-10-CM | POA: Insufficient documentation

## 2024-01-20 DIAGNOSIS — Z86718 Personal history of other venous thrombosis and embolism: Secondary | ICD-10-CM | POA: Insufficient documentation

## 2024-01-20 DIAGNOSIS — Z87891 Personal history of nicotine dependence: Secondary | ICD-10-CM | POA: Insufficient documentation

## 2024-01-20 NOTE — Progress Notes (Signed)
 [facility] Cancer Follow up:    [name], MD [address] [place] [address]   DIAGNOSIS:  Cancer Staging  Malignant neoplasm of lower-outer quadrant of right breast of female, estrogen receptor positive (HCC) Staging form: Breast, AJCC 8th Edition - Clinical stage from 11/20/2016: Stage Unknown (cTX, cN0, cM0, G1, ER+, PR+, HER2-) - Unsigned Histologic grading system: 3 grade system Laterality: Right Staged by: Pathologist and managing physician Stage used in treatment planning: Yes National guidelines used in treatment planning: Yes Type of national guideline used in treatment planning: NCCN    SUMMARY OF ONCOLOGIC HISTORY: 68 y.o. [place] woman status post right breast lower outer quadrant biopsy 11/11/2016 for a clinical TX N), stage I invasive ductal carcinoma, grade 2, E-cadherin positive, estrogen receptor 95% positive, progesterone receptor 5% positive, with an MIB-1 of 10%, and no HER-2 amplification   (1) Status post right lumpectomy and right axillary sentinel lymph node sampling 12/16/2016 for a pT1c pN0, stage IA invasive ductal carcinoma, grade 1, with negative margins.  Total of 3 sentinel lymph nodes removed   (2) The Oncotype DX score was 18, predicting a risk of outside the breast recurrence over the next 10 years of 11% if the patient's only systemic therapy is tamoxifen for 5 years.  It also predicts no benefit from chemotherapy.   (3) Adjuvant radiation 02/05/2017-03/05/2017 Site/dose:           1. Right breast, 2.67 Gy in 15 fractions for a total dose of 40.05 Gy                               2. Right breast boost, 2 Gy in 5 fractions for a total dose of 10 Gy    (4) anastrozole  started February 2019             (a) not a good tamoxifen candidate given problem #5             (b) bone density at breast center 10/04/2015 with T-score of -0.3 (normal)             (c) bone density 12/28/2018 showed a T score of -0.4 (normal)              (d) bone density 04/26/2021   (5) history of DVT/PE March 2010, with negative extensive hypercoagulable workup, IVC filter in place    CURRENT THERAPY: observation  INTERVAL HISTORY:  Discussed the use of AI scribe software for clinical note transcription with the patient, who gave verbal consent to proceed.  History of Present Illness  [name] is a 68 year old female with breast cancer who presents with side effects from cancer medication.  Diagnosed with breast cancer in October 2018, she underwent surgery in November 2018, followed by radiation therapy in January 2019. She began anti-estrogen therapy in early 2019.  Recently, she experienced severe side effects from the anti-estrogen medication, including severe nausea, fatigue, elevated blood pressure, and diarrhea, resulting in significant weight loss from 190 pounds to 170 pounds. She visited the emergency room three times and consulted her primary care provider, who suggested the medication might be the cause of her symptoms. Despite initial reluctance, she discontinued the medication after discussing the results of a recent test with her provider.  Her blood work, including a mammogram and a Guardian test, were performed.  Her past medical history includes high cholesterol and high blood pressure,  for which she takes medication. She has concerns about her A1c levels and potential diabetes.  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 She plans to visit [place]  for Christmas.   Discussed the use of AI scribe software for clinical note transcription with the patient, who gave verbal consent to proceed.  History of Present Illness [name] is a 68 year old female with breast cancer who presents with side effects from cancer medication.  Diagnosed with breast cancer in October 2018, she underwent surgery in November 2018, followed by radiation  therapy in January 2019. She began anti-estrogen therapy in early 2019.  Recently, she experienced severe side effects from the anti-estrogen medication, including severe nausea, fatigue, elevated blood pressure, and diarrhea, resulting in significant weight loss from 190 pounds to 170 pounds. She visited the emergency room three times and consulted her primary care provider, who suggested the medication might be the cause of her symptoms. Despite initial reluctance, she discontinued the medication after discussing the results of a recent test with her provider.  Her blood work, including a mammogram and a Guardant reveal showed no evidence of MRD  Rest of the pertinent 10 point ROS reviewed and neg.  Patient Active Problem List   Diagnosis Date Noted   Right thyroid  nodule 10/15/2022   Pre-diabetes 07/04/2021   Wellness examination 03/06/2021   Graves disease 03/02/2021   Dizziness 03/02/2021   History of pulmonary embolism 01/26/2021   Uterine leiomyoma 01/26/2021   Vitamin D  deficiency 01/26/2021   Dyslipidemia 11/08/2018   S/P insertion of inferior vena caval filter 08/04/2018   Systolic murmur 07/10/2018   History of DVT (deep vein thrombosis) 02/16/2018   History of breast cancer 02/16/2018   Malignant neoplasm of lower-outer quadrant of right breast of female, estrogen receptor positive (HCC) 11/19/2016   Alkaline phosphatase raised 10/29/2015   Anxiety 10/04/2014   Hyperthyroidism 09/03/2014   Vitamin B 12 deficiency 04/19/2008   ANEMIA, IRON DEFICIENCY 04/19/2008   Hypertensive disorder 03/30/2008   GERD 03/30/2008    is allergic to amoxicillin-pot clavulanate and niacin-lovastatin er.  MEDICAL HISTORY: Past Medical History:  Diagnosis Date   ALLERGIC RHINITIS 03/30/2008   Qualifier: Diagnosis of  By: Norleen MD, [place] ORN    Anxiety    Arthritis    neck   Breast cancer (HCC) 2018   Right Breast Cancer   Deep venous thrombosis (HCC) 01/26/2021   Dysarthria 09/22/2012    GERD (gastroesophageal reflux disease)    Graves' disease 09/03/2014   Headache(784.0) 09/22/2012   History of hiatal hernia    History of radiation therapy 02/05/2017   02/05/17-03/05/17,  right breast 40.05 Gy in 15 fractions, right breast boost 10 Gy in 5 fractions   Hyperlipidemia    Hypertension    Hyperthyroidism    Iron deficiency anemia    LEG PAIN, RIGHT 04/22/2008   Qualifier: Diagnosis of  By: Norleen MD, [place] ORN    Low back pain 01/26/2021   Malignant tumor of breast (HCC) 11/13/2016   Palpitation 09/24/2013   Personal history of radiation therapy 2018   Right Breast Cancer   Pulmonary emboli (HCC) 11/20/2016   Uterine fibroid    Vitamin D  deficiency    Graves disease    SURGICAL HISTORY: Past Surgical History:  Procedure Laterality Date   BREAST LUMPECTOMY Right 12/16/2016   BREAST LUMPECTOMY WITH RADIOACTIVE SEED AND SENTINEL LYMPH NODE BIOPSY Right 12/16/2016   Procedure: RIGHT  BREAST LUMPECTOMY WITH RADIOACTIVE SEED AND SENTINEL LYMPH NODE BIOPSY;  Surgeon: Ebbie Cough, MD;  Location: [facility] OR;  Service: General;  Laterality: Right;   CHOLECYSTECTOMY     COLONOSCOPY     DILATION AND CURETTAGE OF UTERUS     01/2008   ESOPHAGOGASTRODUODENOSCOPY     ivc filter      SOCIAL HISTORY: Social History   Socioeconomic History   Marital status: Single    Spouse name: Not on file   Number of children: 0   Years of education: college   Highest education level: Not on file  Occupational History   Occupation: TAX SPECIALIST    Employer: [employer]  Tobacco Use   Smoking status: Former    Current packs/day: 0.00    Average packs/day: 0.4 packs/day for 10.0 years (4.0 ttl pk-yrs)    Types: Cigarettes    Start date: 05/31/1992    Quit date: 06/01/2002    Years since quitting: 21.6   Smokeless tobacco: Never  Vaping Use   Vaping status: Never Used  Substance and Sexual Activity   Alcohol use: Yes    Comment: occasional 1 drink per month   Drug use: No    Sexual activity: Not on file  Other Topics Concern   Not on file  Social History Narrative   Lives alone.    Social Drivers of Corporate Investment Banker Strain: Low Risk  (11/03/2023)   Overall Financial Resource Strain (CARDIA)    Difficulty of Paying Living Expenses: Not hard at all  Food Insecurity: No Food Insecurity (11/03/2023)   Hunger Vital Sign    Worried About Running Out of Food in the Last Year: Never true    Ran Out of Food in the Last Year: Never true  Transportation Needs: No Transportation Needs (11/03/2023)   PRAPARE - Administrator, Civil Service (Medical): No    Lack of Transportation (Non-Medical): No  Physical Activity: Inactive (11/03/2023)   Exercise Vital Sign    Days of Exercise per Week: 0 days    Minutes of Exercise per Session: 0 min  Stress: No Stress Concern Present (11/03/2023)   [employer] of Occupational Health - Occupational Stress Questionnaire    Feeling of Stress: Only a little  Social Connections: Moderately Integrated (11/03/2023)   Social Connection and Isolation Panel    Frequency of Communication with Friends and Family: More than three times a week    Frequency of Social Gatherings with Friends and Family: Patient unable to answer    Attends Religious Services: More than 4 times per year    Active Member of [employer] or Organizations: Yes    Attends Engineer, Structural: More than 4 times per year    Marital Status: Divorced  Intimate Partner Violence: Not At Risk (11/03/2023)   Humiliation, Afraid, Rape, and Kick questionnaire    Fear of Current or Ex-Partner: No    Emotionally Abused: No    Physically Abused: No    Sexually Abused: No    FAMILY HISTORY: Family History  Problem Relation Age of Onset   Hypertension Mother    Colon cancer Maternal Grandfather    Drug abuse Brother    Breast cancer Neg Hx     Review of Systems  Constitutional:  Negative for appetite change, chills, fatigue, fever and  unexpected weight change.  HENT:   Negative for hearing loss, lump/mass and trouble swallowing.   Eyes:  Negative for eye problems and icterus.  Respiratory:  Negative for chest tightness, cough and shortness of breath.   Cardiovascular:  Negative for chest pain, leg swelling and palpitations.  Gastrointestinal:  Negative for abdominal distention, abdominal pain, constipation, diarrhea, nausea and vomiting.  Endocrine: Negative for hot flashes.  Genitourinary:  Negative for difficulty urinating.   Musculoskeletal:  Negative for arthralgias.  Skin:  Negative for itching and rash.  Neurological:  Negative for dizziness, extremity weakness, headaches and numbness.  Hematological:  Negative for adenopathy. Does not bruise/bleed easily.  Psychiatric/Behavioral:  Negative for depression. The patient is not nervous/anxious.       PHYSICAL EXAMINATION    Vitals:   01/20/24 1422  BP: 136/75  Pulse: 63  Resp: 17  Temp: (!) 97.1 F (36.2 C)  SpO2: 99%    Physical Exam Constitutional:      General: She is not in acute distress.    Appearance: Normal appearance. She is not toxic-appearing.  HENT:     Head: Normocephalic and atraumatic.     Mouth/Throat:     Mouth: Mucous membranes are moist.     Pharynx: Oropharynx is clear. No oropharyngeal exudate or posterior oropharyngeal erythema.  Eyes:     General: No scleral icterus. Cardiovascular:     Rate and Rhythm: Normal rate and regular rhythm.     Pulses: Normal pulses.     Heart sounds: Normal heart sounds.  Pulmonary:     Effort: Pulmonary effort is normal.     Breath sounds: Normal breath sounds.  Chest:     Comments: Right breast s/p lumpectomy and radiation, no sign of local recurrence, left breast benign Abdominal:     General: Abdomen is flat. Bowel sounds are normal. There is no distension.     Palpations: Abdomen is soft.     Tenderness: There is no abdominal tenderness.  Musculoskeletal:        General: No swelling.      Cervical back: Neck supple.  Lymphadenopathy:     Cervical: No cervical adenopathy.     Upper Body:     Right upper body: No supraclavicular or axillary adenopathy.     Left upper body: No supraclavicular or axillary adenopathy.  Skin:    General: Skin is warm and dry.     Findings: No rash.  Neurological:     General: No focal deficit present.     Mental Status: She is alert.  Psychiatric:        Mood and Affect: Mood normal.        Behavior: Behavior normal.     LABORATORY DATA:  CBC    Component Value Date/Time   WBC 4.7 10/08/2023 0757   RBC 4.05 10/08/2023 0757   HGB 12.9 10/08/2023 0757   HGB 12.9 12/05/2022 1132   HGB 11.8 01/26/2021 1025   HGB 12.5 11/20/2016 0822   HCT 38.7 10/08/2023 0757   HCT 36.6 01/26/2021 1025   HCT 38.2 11/20/2016 0822   PLT 142 (L) 10/08/2023 0757   PLT 150 12/05/2022 1132   PLT 178 01/26/2021 1025   MCV 95.6 10/08/2023 0757   MCV 95 01/26/2021 1025   MCV 97.9 11/20/2016 0822   MCH 31.9 10/08/2023 0757   MCHC 33.3 10/08/2023 0757   RDW 12.6 10/08/2023 0757   RDW 12.9 01/26/2021 1025   RDW 13.1 11/20/2016 0822   LYMPHSABS 1.0 08/14/2023 1158   LYMPHSABS 1.5 01/26/2021 1025   LYMPHSABS 2.4 11/20/2016 0822   MONOABS 0.3 08/14/2023 1158   MONOABS 0.3  11/20/2016 0822   EOSABS 0.0 08/14/2023 1158   EOSABS 0.2 01/26/2021 1025   BASOSABS 0.0 08/14/2023 1158   BASOSABS 0.0 01/26/2021 1025   BASOSABS 0.0 11/20/2016 0822    CMP     Component Value Date/Time   NA 141 11/25/2023 1313   NA 141 11/20/2016 0822   K 4.4 11/25/2023 1313   K 3.6 11/20/2016 0822   CL 105 11/25/2023 1313   CL 105 06/24/2012 1548   CO2 23 11/25/2023 1313   CO2 24 11/20/2016 0822   GLUCOSE 100 (H) 11/25/2023 1313   GLUCOSE 147 (H) 09/28/2023 1414   GLUCOSE 102 11/20/2016 0822   GLUCOSE 91 06/24/2012 1548   BUN 11 11/25/2023 1313   BUN 19.2 11/20/2016 0822   CREATININE 0.88 11/25/2023 1313   CREATININE 0.94 12/05/2022 1132   CREATININE 0.9  11/20/2016 0822   CALCIUM  9.8 11/25/2023 1313   CALCIUM  9.5 11/20/2016 0822   PROT 7.7 11/25/2023 1313   PROT 8.2 11/20/2016 0822   ALBUMIN 4.4 11/25/2023 1313   ALBUMIN 3.8 11/20/2016 0822   AST 18 11/25/2023 1313   AST 18 12/05/2022 1132   AST 15 11/20/2016 0822   ALT 15 11/25/2023 1313   ALT 18 12/05/2022 1132   ALT 15 11/20/2016 0822   ALKPHOS 182 (H) 11/25/2023 1313   ALKPHOS 122 11/20/2016 0822   BILITOT 0.5 11/25/2023 1313   BILITOT 0.7 12/05/2022 1132   BILITOT 0.52 11/20/2016 0822   GFRNONAA >60 09/28/2023 1414   GFRNONAA >60 12/05/2022 1132   GFRAA >60 03/18/2019 1631     ASSESSMENT and THERAPY PLAN:   Assessment and Plan Assessment & Plan  History of hormone receptor-positive breast cancer, post-treatment, in remission Hormone receptor-positive breast cancer treated with surgery, radiation, and anti-estrogen therapy. Currently in remission with no active cancer. Breast cancer index test indicated no benefit from continuing anti-estrogen therapy beyond five years. - Most recent Guardant reveal showed no evidence of MRD. - No concerns on exam - I once again reassured her about the plan of care - She will RTC for follow up in 6 months - Next Guardant reveal due in April 2026.   All questions were answered. The patient knows to call the clinic with any problems, questions or concerns. We can certainly see the patient much sooner if necessary.  Total encounter time:30 minutes*in face-to-face visit time, chart review, lab review, care coordination, order entry, and documentation of the encounter time.    Morna Kendall, NP 01/20/24 2:26 PM Medical Oncology and Hematology [facility] [address] [place], [place] [address] Tel. [phone]    Fax. [phone]  *Total Encounter Time as defined by the Centers for Medicare and Medicaid Services includes, in addition to the face-to-face time of a patient visit (documented in the note above)  non-face-to-face time: obtaining and reviewing outside history, ordering and reviewing medications, tests or procedures, care coordination (communications with other health care professionals or caregivers) and documentation in the medical record.

## 2024-01-21 ENCOUNTER — Telehealth: Payer: Self-pay | Admitting: Hematology and Oncology

## 2024-01-21 NOTE — Telephone Encounter (Signed)
 I spoke with patient to schedule 6 month follow up with Dr. Loretha per 19/9/25 los. Patient is aware of date/time.

## 2024-01-26 DIAGNOSIS — R5383 Other fatigue: Secondary | ICD-10-CM | POA: Diagnosis not present

## 2024-01-26 DIAGNOSIS — I10 Essential (primary) hypertension: Secondary | ICD-10-CM | POA: Diagnosis not present

## 2024-01-26 DIAGNOSIS — R739 Hyperglycemia, unspecified: Secondary | ICD-10-CM | POA: Diagnosis not present

## 2024-01-26 DIAGNOSIS — E559 Vitamin D deficiency, unspecified: Secondary | ICD-10-CM | POA: Diagnosis not present

## 2024-01-28 ENCOUNTER — Encounter (HOSPITAL_BASED_OUTPATIENT_CLINIC_OR_DEPARTMENT_OTHER): Payer: Self-pay | Admitting: Cardiovascular Disease

## 2024-01-28 ENCOUNTER — Ambulatory Visit (HOSPITAL_BASED_OUTPATIENT_CLINIC_OR_DEPARTMENT_OTHER): Admitting: Cardiovascular Disease

## 2024-01-28 VITALS — BP 138/86 | HR 59 | Ht 67.0 in | Wt 181.2 lb

## 2024-01-28 DIAGNOSIS — R7303 Prediabetes: Secondary | ICD-10-CM

## 2024-01-28 DIAGNOSIS — I10 Essential (primary) hypertension: Secondary | ICD-10-CM

## 2024-01-28 DIAGNOSIS — Z86718 Personal history of other venous thrombosis and embolism: Secondary | ICD-10-CM | POA: Diagnosis not present

## 2024-01-28 NOTE — Progress Notes (Signed)
 [facility] Initial Assessment:    Date:  01/28/2024   ID:  [name], DOB April 25, 1955, MRN [id number]  PCP:  [name], MD  Cardiologist:  Lynwood Schilling, MD   Referring MD: [name], MD   CC: Hypertension  History of Present Illness:    [name] is a 68 y.o. female with a hx of hypertension, hyeprlipidemia, mild ascending aorta aneurysm, aortic atherosclerosis, and prior DVT/PE here for follow-up.  She saw Dr. Schilling 10/17/23 for palpitations.  They discussed her BP and her dilated aorta.  She struggles with anxiety and planned to evaluate her aorta with CT in one year.  She was noted to have a mild murmur and plans were made for an echo in 1 year.  BP was controlled on candesartan .  [name] has a history of hypertension with fluctuating blood pressure. Initially managed with a diuretic, she was later switched to candesartan  8 mg, which she reduced to 4 mg after weight loss. Her blood pressure now ranges from 120s to 140s over 80s to 90s. She monitors her blood pressure regularly and sometimes skips doses due to anxiety and fear of hypotension. She experiences anxiety related to blood pressure management.  No changes were made to her initial visit.  Aspirin  was added to her regimen.  Atorvastatin  was restarted after her visit, as holding it did not improve her fatigue.  She has a history of breast cancer, diagnosed at stage zero, and has been on medication for nearly six years.  Discussed the use of AI scribe software for clinical note transcription with the patient, who gave verbal consent to proceed.  History of Present Illness [name] feels well with stable blood pressure readings averaging around 120 mmHg. She does not take her blood pressure medication daily due to these good readings but monitors her blood pressure daily. She recalls a past episode of a vagal response after a bowel movement, which she associates with her blood  pressure medication. She was on 8 mg at that time.  She experienced brief chest pressure twice while driving, lasting about a second, which she attributes to gas from her morning smoothies containing Greek yogurt, almond milk, and bananas. She does not recall experiencing chest pain or pressure during physical activity, such as walking or climbing stairs, but is unsure if it occurs with strenuous activity.  Her cholesterol was noted to be high in October, which was unusual for her. She speculates that dietary changes, such as using an air fryer, might be related. She regularly eats oatmeal and does not consume much alcohol or shrimp. She is trying to increase her physical activity by walking daily and doing stretches at home.  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  Previous antihypertensives:    Past Medical History:  Diagnosis Date   ALLERGIC RHINITIS 03/30/2008   Qualifier: Diagnosis of  By: Norleen MD, Lynwood ORN    Anxiety    Arthritis    neck   Breast cancer (HCC) 2018   Right Breast Cancer   Deep venous thrombosis (HCC) 01/26/2021   Dysarthria 09/22/2012   GERD (gastroesophageal reflux disease)    Graves' disease 09/03/2014   Headache(784.0) 09/22/2012   History of hiatal hernia    History of radiation therapy 02/05/2017   02/05/17-03/05/17,  right breast 40.05 Gy in 15 fractions, right breast boost 10 Gy in 5  fractions   Hyperlipidemia    Hypertension    Hyperthyroidism    Iron deficiency anemia    LEG PAIN, RIGHT 04/22/2008   Qualifier: Diagnosis of  By: Norleen MD, Lynwood ORN    Low back pain 01/26/2021   Malignant tumor of breast (HCC) 11/13/2016   Palpitation 09/24/2013   Personal history of radiation therapy 2018   Right Breast Cancer   Pulmonary emboli (HCC) 11/20/2016   Uterine fibroid    Vitamin D  deficiency    Graves disease    Past Surgical History:  Procedure  Laterality Date   BREAST LUMPECTOMY Right 12/16/2016   BREAST LUMPECTOMY WITH RADIOACTIVE SEED AND SENTINEL LYMPH NODE BIOPSY Right 12/16/2016   Procedure: RIGHT BREAST LUMPECTOMY WITH RADIOACTIVE SEED AND SENTINEL LYMPH NODE BIOPSY;  Surgeon: Ebbie Cough, MD;  Location: [facility] OR;  Service: General;  Laterality: Right;   CHOLECYSTECTOMY     COLONOSCOPY     DILATION AND CURETTAGE OF UTERUS     01/2008   ESOPHAGOGASTRODUODENOSCOPY     ivc filter      Current Medications: No outpatient medications have been marked as taking for the 01/28/24 encounter (Appointment) with Raford Riggs, MD.     Allergies:   Amoxicillin-pot clavulanate and Niacin-lovastatin er   Social History   Socioeconomic History   Marital status: Single    Spouse name: Not on file   Number of children: 0   Years of education: college   Highest education level: Not on file  Occupational History   Occupation: TAX SPECIALIST    Employer: [employer]  Tobacco Use   Smoking status: Former    Current packs/day: 0.00    Average packs/day: 0.4 packs/day for 10.0 years (4.0 ttl pk-yrs)    Types: Cigarettes    Start date: 05/31/1992    Quit date: 06/01/2002    Years since quitting: 21.6   Smokeless tobacco: Never  Vaping Use   Vaping status: Never Used  Substance and Sexual Activity   Alcohol use: Yes    Comment: occasional 1 drink per month   Drug use: No   Sexual activity: Not on file  Other Topics Concern   Not on file  Social History Narrative   Lives alone.    Social Drivers of Health   Tobacco Use: Medium Risk (11/26/2023)   Patient History    Smoking Tobacco Use: Former    Smokeless Tobacco Use: Never    Passive Exposure: Not on file  Financial Resource Strain: Low Risk (11/03/2023)   Overall Financial Resource Strain (CARDIA)    Difficulty of Paying Living Expenses: Not hard at all  Food Insecurity: No Food Insecurity (11/03/2023)   Epic    Worried About Radiation Protection Practitioner of Food in the Last  Year: Never true    Ran Out of Food in the Last Year: Never true  Transportation Needs: No Transportation Needs (11/03/2023)   Epic    Lack of Transportation (Medical): No    Lack of Transportation (Non-Medical): No  Physical Activity: Inactive (11/03/2023)   Exercise Vital Sign    Days of Exercise per Week: 0 days    Minutes of Exercise per Session: 0 min  Stress: No Stress Concern Present (11/03/2023)   [employer] of Occupational Health - Occupational Stress Questionnaire    Feeling of Stress: Only a little  Social Connections: Moderately Integrated (11/03/2023)   Social Connection and Isolation Panel    Frequency of Communication with Friends and Family: More than three times a  week    Frequency of Social Gatherings with Friends and Family: Patient unable to answer    Attends Religious Services: More than 4 times per year    Active Member of Clubs or Organizations: Yes    Attends Banker Meetings: More than 4 times per year    Marital Status: Divorced  Depression (PHQ2-9): Low Risk (11/20/2023)   Depression (PHQ2-9)    PHQ-2 Score: 0  Alcohol Screen: Low Risk (11/03/2023)   Alcohol Screen    Last Alcohol Screening Score (AUDIT): 2  Housing: Low Risk (11/03/2023)   Epic    Unable to Pay for Housing in the Last Year: No    Number of Times Moved in the Last Year: 0    Homeless in the Last Year: No  Utilities: Not At Risk (11/03/2023)   Epic    Threatened with loss of utilities: No  Health Literacy: Adequate Health Literacy (11/03/2023)   B1300 Health Literacy    Frequency of need for help with medical instructions: Never     Family History: The patient's family history includes Colon cancer in her maternal grandfather; Drug abuse in her brother; Hypertension in her mother. There is no history of Breast cancer.  ROS:   Please see the history of present illness.     All other systems reviewed and are negative.  EKGs/Labs/Other Studies Reviewed:    EKG:  EKG  is not ordered today.   Recent Labs: 10/08/2023: Hemoglobin 12.9; Platelets 142 10/17/2023: TSH 1.38 11/25/2023: ALT 15; BUN 11; Creatinine, Ser 0.88; Potassium 4.4; Sodium 141   Recent Lipid Panel    Component Value Date/Time   CHOL 254 (H) 11/25/2023 1313   TRIG 80 11/25/2023 1313   HDL 54 11/25/2023 1313   CHOLHDL 4.7 (H) 11/25/2023 1313   CHOLHDL 4.4 04/06/2010 1037   VLDL 16 04/06/2010 1037   LDLCALC 186 (H) 11/25/2023 1313    Physical Exam:   VS:  LMP 06/18/2010  , BMI There is no height or weight on file to calculate BMI. GENERAL:  Well appearing HEENT: Pupils equal round and reactive, fundi not visualized, oral mucosa unremarkable NECK:  No jugular venous distention, waveform within normal limits, carotid upstroke brisk and symmetric, no bruits, no thyromegaly LUNGS:  Clear to auscultation bilaterally HEART:  RRR.  PMI not displaced or sustained,S1 and S2 within normal limits, no S3, no S4, no clicks, no rubs, no murmurs ABD:  Flat, positive bowel sounds normal in frequency in pitch, no bruits, no rebound, no guarding, no midline pulsatile mass, no hepatomegaly, no splenomegaly EXT:  2 plus pulses throughout, no edema, no cyanosis no clubbing SKIN:  No rashes no nodules NEURO:  Cranial nerves II through XII grossly intact, motor grossly intact throughout PSYCH:  Cognitively intact, oriented to person place and time   ASSESSMENT/PLAN:    Assessment & Plan # Primary  hypertension Blood pressure averages 120/80-90 mmHg. Inconsistent medication use due to hypotension concerns. Current readings safe for medication.  Encourage her to take candesartan  every day as scheduled. - Continue antihypertensive medication daily. - Ensure adequate hydration. - Monitor blood pressure regularly.  # Aortic atherosclerosis: # Hyperlipidemia Cholesterol elevated in October, likely due to diet. On 40 mg atorvastatin , increased from 20 mg. - Continue 40 mg atorvastatin . - Increase physical  activity. - Monitor diet to reduce fried foods and animal products. - Recheck cholesterol levels in late March or early April.  # Chest pain:  Symptoms are atypical.  No ischemic  evaluation needed.   Screening for Secondary Hypertension:     11/03/2023   10:00 AM  Causes  Drugs/Herbals Screened     - Comments limits salt.  limits caffeine.  No EtOH.  quit tob  Renovascular HTN N/A  Sleep Apnea Screened     - Comments no symptoms  Thyroid  Disease Screened  Hyperaldosteronism N/A  Pheochromocytoma N/A  Cushing's Syndrome N/A  Hyperparathyroidism N/A  Coarctation of the Aorta Screened     - Comments BP symmetric  Compliance Screened    Relevant Labs/Studies:    Latest Ref Rng & Units 11/25/2023    1:13 PM 09/28/2023    2:14 PM 08/14/2023   11:58 AM  Basic Labs  Sodium 134 - 144 mmol/L 141  136  140   Potassium 3.5 - 5.2 mmol/L 4.4  3.9  3.9   Creatinine 0.57 - 1.00 mg/dL 9.11  9.10  9.03        Latest Ref Rng & Units 10/17/2023    3:20 PM 08/22/2023    1:40 PM  Thyroid    TSH 0.40 - 4.50 mIU/L 1.38  1.90      Disposition:    FU with MD/PharmD in 3-4 months    Medication Adjustments/Labs and Tests Ordered: Current medicines are reviewed at length with the patient today.  Concerns regarding medicines are outlined above.  No orders of the defined types were placed in this encounter.  No orders of the defined types were placed in this encounter.    Signed, Annabella Scarce, MD  01/28/2024 1:33 PM    [facility]

## 2024-01-28 NOTE — Patient Instructions (Signed)
 Medication Instructions:  Your physician recommends that you continue on your current medications as directed. Please refer to the Current Medication list given to you today.  Labwork: FASTING LP/CMET IN 3 TO 4 MONTHS ABOUT A WEEK PRIOR TO FOLLOW UP   Testing/Procedures: NONE  Follow-Up: 3 TO 4 MONTHS WITH CAITLIN W NP OR DR Chase   Any Other Special Instructions Will Be Listed Below (If Applicable).  INCREASE YOUR EXERCISE   If you need a refill on your cardiac medications before your next appointment, please call your pharmacy.

## 2024-01-29 DIAGNOSIS — Z7185 Encounter for immunization safety counseling: Secondary | ICD-10-CM | POA: Diagnosis not present

## 2024-01-29 DIAGNOSIS — K219 Gastro-esophageal reflux disease without esophagitis: Secondary | ICD-10-CM | POA: Diagnosis not present

## 2024-01-29 DIAGNOSIS — E11319 Type 2 diabetes mellitus with unspecified diabetic retinopathy without macular edema: Secondary | ICD-10-CM | POA: Diagnosis not present

## 2024-01-29 DIAGNOSIS — E1159 Type 2 diabetes mellitus with other circulatory complications: Secondary | ICD-10-CM | POA: Diagnosis not present

## 2024-01-29 DIAGNOSIS — Z23 Encounter for immunization: Secondary | ICD-10-CM | POA: Diagnosis not present

## 2024-04-28 ENCOUNTER — Encounter (HOSPITAL_BASED_OUTPATIENT_CLINIC_OR_DEPARTMENT_OTHER): Admitting: Cardiovascular Disease

## 2024-05-10 ENCOUNTER — Ambulatory Visit: Admitting: Internal Medicine

## 2024-07-21 ENCOUNTER — Inpatient Hospital Stay: Attending: Adult Health | Admitting: Hematology and Oncology

## 2024-09-30 ENCOUNTER — Other Ambulatory Visit (HOSPITAL_COMMUNITY)
